# Patient Record
Sex: Male | Born: 1945
Health system: Southern US, Community
[De-identification: ages and names within clinical notes are randomized; demographics above are authoritative.]

## PROBLEM LIST (undated history)

## (undated) DIAGNOSIS — Z86010 Personal history of colonic polyps: Secondary | ICD-10-CM

## (undated) DIAGNOSIS — K279 Peptic ulcer, site unspecified, unspecified as acute or chronic, without hemorrhage or perforation: Secondary | ICD-10-CM

## (undated) DIAGNOSIS — Z8679 Personal history of other diseases of the circulatory system: Secondary | ICD-10-CM

## (undated) DIAGNOSIS — Z8669 Personal history of other diseases of the nervous system and sense organs: Secondary | ICD-10-CM

## (undated) DIAGNOSIS — L0390 Cellulitis, unspecified: Secondary | ICD-10-CM

## (undated) DIAGNOSIS — C801 Malignant (primary) neoplasm, unspecified: Secondary | ICD-10-CM

## (undated) DIAGNOSIS — M1990 Unspecified osteoarthritis, unspecified site: Secondary | ICD-10-CM

## (undated) DIAGNOSIS — I10 Essential (primary) hypertension: Secondary | ICD-10-CM

## (undated) HISTORY — PX: APPENDECTOMY: SHX54

## (undated) HISTORY — DX: Personal history of other diseases of the circulatory system: Z86.79

## (undated) HISTORY — DX: Cellulitis, unspecified: L03.90

## (undated) HISTORY — PX: MANDIBLE FRACTURE SURGERY: SHX706

## (undated) HISTORY — PX: PARTIAL GASTRECTOMY: SHX6003

## (undated) HISTORY — DX: Unspecified osteoarthritis, unspecified site: M19.90

## (undated) HISTORY — DX: Personal history of colonic polyps: Z86.010

## (undated) HISTORY — DX: Personal history of other diseases of the nervous system and sense organs: Z86.69

## (undated) HISTORY — DX: Peptic ulcer, site unspecified, unspecified as acute or chronic, without hemorrhage or perforation: K27.9

---

## 1988-02-08 DIAGNOSIS — K279 Peptic ulcer, site unspecified, unspecified as acute or chronic, without hemorrhage or perforation: Secondary | ICD-10-CM | POA: Insufficient documentation

## 2005-11-23 DIAGNOSIS — E7800 Pure hypercholesterolemia, unspecified: Secondary | ICD-10-CM | POA: Insufficient documentation

## 2005-11-25 DIAGNOSIS — E785 Hyperlipidemia, unspecified: Secondary | ICD-10-CM | POA: Insufficient documentation

## 2007-03-26 DIAGNOSIS — B009 Herpesviral infection, unspecified: Secondary | ICD-10-CM | POA: Insufficient documentation

## 2008-04-04 ENCOUNTER — Ambulatory Visit: Payer: Self-pay | Admitting: Family Medicine

## 2008-04-04 DIAGNOSIS — Z8701 Personal history of pneumonia (recurrent): Secondary | ICD-10-CM | POA: Insufficient documentation

## 2008-04-04 HISTORY — DX: Personal history of pneumonia (recurrent): Z87.01

## 2008-05-01 ENCOUNTER — Ambulatory Visit: Payer: Self-pay | Admitting: Family Medicine

## 2011-08-10 DIAGNOSIS — L4050 Arthropathic psoriasis, unspecified: Secondary | ICD-10-CM | POA: Insufficient documentation

## 2013-02-10 LAB — HM COLONOSCOPY

## 2013-02-21 DIAGNOSIS — K259 Gastric ulcer, unspecified as acute or chronic, without hemorrhage or perforation: Secondary | ICD-10-CM | POA: Insufficient documentation

## 2014-05-02 LAB — LIPID PANEL
CHOLESTEROL: 132 mg/dL (ref 0–200)
HDL: 35 mg/dL (ref 35–70)
LDL CALC: 78 mg/dL
TRIGLYCERIDES: 97 mg/dL (ref 40–160)

## 2014-05-02 LAB — BASIC METABOLIC PANEL
BUN: 8 mg/dL (ref 4–21)
Creatinine: 1 mg/dL (ref <=1.3)
GLUCOSE: 83 mg/dL
Potassium: 4.5 mmol/L (ref 3.4–5.3)
SODIUM: 142 mmol/L (ref 137–147)

## 2014-05-02 LAB — HEPATIC FUNCTION PANEL
ALT: 18 U/L (ref 10–40)
AST: 18 U/L (ref 14–40)

## 2014-05-08 ENCOUNTER — Ambulatory Visit
Admit: 2014-05-08 | Disposition: A | Discharge status: Home / Self Care | Payer: Self-pay | Attending: Family Medicine | Admitting: Family Medicine

## 2015-03-24 ENCOUNTER — Ambulatory Visit (INDEPENDENT_AMBULATORY_CARE_PROVIDER_SITE_OTHER): Payer: Medicare Other | Admitting: Family Medicine

## 2015-03-24 ENCOUNTER — Encounter: Payer: Self-pay | Admitting: Family Medicine

## 2015-03-24 VITALS — BP 128/68 | HR 81 | Temp 98.2°F | Resp 18

## 2015-03-24 DIAGNOSIS — Z8719 Personal history of other diseases of the digestive system: Secondary | ICD-10-CM | POA: Insufficient documentation

## 2015-03-24 DIAGNOSIS — Z8679 Personal history of other diseases of the circulatory system: Secondary | ICD-10-CM

## 2015-03-24 DIAGNOSIS — M1990 Unspecified osteoarthritis, unspecified site: Secondary | ICD-10-CM | POA: Insufficient documentation

## 2015-03-24 DIAGNOSIS — Z8669 Personal history of other diseases of the nervous system and sense organs: Secondary | ICD-10-CM

## 2015-03-24 DIAGNOSIS — B353 Tinea pedis: Secondary | ICD-10-CM | POA: Insufficient documentation

## 2015-03-24 DIAGNOSIS — Z860101 Personal history of adenomatous and serrated colon polyps: Secondary | ICD-10-CM | POA: Insufficient documentation

## 2015-03-24 DIAGNOSIS — L4050 Arthropathic psoriasis, unspecified: Secondary | ICD-10-CM

## 2015-03-24 DIAGNOSIS — Z7189 Other specified counseling: Secondary | ICD-10-CM

## 2015-03-24 DIAGNOSIS — Z86010 Personal history of colonic polyps: Secondary | ICD-10-CM

## 2015-03-24 DIAGNOSIS — D849 Immunodeficiency, unspecified: Secondary | ICD-10-CM | POA: Insufficient documentation

## 2015-03-24 DIAGNOSIS — G8929 Other chronic pain: Secondary | ICD-10-CM

## 2015-03-24 DIAGNOSIS — D899 Disorder involving the immune mechanism, unspecified: Secondary | ICD-10-CM

## 2015-03-24 DIAGNOSIS — L989 Disorder of the skin and subcutaneous tissue, unspecified: Secondary | ICD-10-CM | POA: Insufficient documentation

## 2015-03-24 HISTORY — DX: Personal history of colonic polyps: Z86.010

## 2015-03-24 HISTORY — DX: Personal history of other diseases of the nervous system and sense organs: Z86.69

## 2015-03-24 HISTORY — DX: Personal history of adenomatous and serrated colon polyps: Z86.0101

## 2015-03-24 HISTORY — DX: Personal history of other diseases of the circulatory system: Z86.79

## 2015-03-24 NOTE — Progress Notes (Signed)
Patient: Edward Mejia Male    DOB: 1946-02-05   70 y.o.   MRN: [id number] Visit Date: 03/24/2015  Today's Provider: Lelon Huh, MD   Chief Complaint  Patient presents with  . Arthritis   Subjective:    HPI  Arthritis: Patient with long history of arthritis followed by Dr. Esperanza Sheets in [place]. Has been on scheduled pain medications for many year, previously on Darvocet until it was discontinued. Was then started on hydrocodone 5/325 TID which was very effective for years, but increase 7.5mg /325 about 2 or three years ago. He has occasional exacerbations which he states respond well to steroids by Dr. Esperanza Sheets. However, he presents letter that Dr. Esperanza Sheets sent him a few months ago stating that Dr. Esperanza Sheets could no longer prescribe hydrocodone due to new regulations, and was advised to go to pain clinic to get his prescriptions. Dr. Esperanza Sheets switched him to tramadol about 6 weeks ago and states pain has been terrible.      Allergies  Allergen Reactions  . Pravachol  [Pravastatin]     Fatigue   Previous Medications   ETANERCEPT (ENBREL) 50 MG/ML INJECTION    Inject into the skin once a week.   FOLIC ACID PO    Take by mouth daily.   METHOTREXATE 2.5 MG TABLET    Take 1 tablet by mouth 3 (three) times a week.   MISOPROSTOL (CYTOTEC) 100 MCG TABLET    Take 1 tablet by mouth daily.   MULTIPLE VITAMIN PO    Take 1 tablet by mouth daily.   OMEGA-3 FATTY ACIDS PO    Take 2 capsules by mouth daily.   PREDNISONE (DELTASONE) 5 MG TABLET    Take 1.5 tablets by mouth daily.   TRAMADOL (ULTRAM) 50 MG TABLET    Take 2 tablets by mouth every 8 (eight) hours as needed.    Review of Systems  Constitutional: Negative for fever, chills and appetite change.  Respiratory: Negative for chest tightness, shortness of breath and wheezing.   Cardiovascular: Negative for chest pain and palpitations.  Gastrointestinal: Negative for nausea, vomiting and abdominal pain.  Musculoskeletal:  Positive for arthralgias. Negative for myalgias and joint swelling.    Social History  Substance Use Topics  . Smoking status: Never Smoker   . Smokeless tobacco: Not on file  . Alcohol Use: No   Objective:   BP 128/68 mmHg  Pulse 81  Temp(Src) 98.2 F (36.8 C) (Oral)  Resp 18  SpO2 96%  Physical Exam  General appearance: alert, well developed, well nourished, cooperative and in no distress Head: Normocephalic, without obvious abnormality, atraumatic Lungs: Respirations even and unlabored  Skin: Skin color, texture, turgor normal. No rashes seen  Psych: Appropriate mood and affect. Neurologic: Mental status: Alert, oriented to person, place, and time, thought content appropriate.     Assessment & Plan:     1. Encounter for chronic pain management Extensively reviewed records of Dr. Esperanza Sheets through care everywhere. No signs of abuse, dependence or diversion and patient has been stable on same dose of hydrocodone APAP for years. He gets occasional joint injections from his rheumatologist. Will check UDS, if results are as expected we can resume prescribing prior dose of hydrocodone/apap.  - Pain Management Screening Profile (10S)  2. Psoriatic arthritis ([place])   Over half of this 30 minute visit were spent in counseling and coordination of care.       Lelon Huh, MD  [facility]

## 2015-03-26 ENCOUNTER — Other Ambulatory Visit: Payer: Self-pay | Admitting: Family Medicine

## 2015-03-26 DIAGNOSIS — L4050 Arthropathic psoriasis, unspecified: Secondary | ICD-10-CM | POA: Diagnosis not present

## 2015-03-26 DIAGNOSIS — Z79899 Other long term (current) drug therapy: Secondary | ICD-10-CM | POA: Diagnosis not present

## 2015-03-26 DIAGNOSIS — M67312 Transient synovitis, left shoulder: Secondary | ICD-10-CM | POA: Diagnosis not present

## 2015-03-26 LAB — PMP SCREEN PROFILE (10S), URINE
Amphetamine Screen, Ur: NEGATIVE ng/mL
Barbiturate Screen, Ur: NEGATIVE ng/mL
Benzodiazepine Screen, Urine: NEGATIVE ng/mL
Cannabinoids Ur Ql Scn: NEGATIVE ng/mL
Cocaine(Metab.)Screen, Urine: NEGATIVE ng/mL
Creatinine(Crt), U: 58 mg/dL (ref 20.0–300.0)
Methadone Scn, Ur: NEGATIVE ng/mL
Opiate Scrn, Ur: NEGATIVE ng/mL
Oxycodone+Oxymorphone Ur Ql Scn: NEGATIVE ng/mL
PCP Scrn, Ur: NEGATIVE ng/mL
Ph of Urine: 5.2 (ref 4.5–8.9)
Propoxyphene, Screen: NEGATIVE ng/mL

## 2015-03-26 MED ORDER — HYDROCODONE-ACETAMINOPHEN 7.5-325 MG PO TABS: 1.0000 | ORAL_TABLET | Freq: Three times a day (TID) | ORAL | Status: DC | PRN

## 2015-03-27 ENCOUNTER — Ambulatory Visit
Admission: RE | Admit: 2015-03-27 | Discharge: 2015-03-27 | Disposition: A | Discharge status: Home / Self Care | Payer: Medicare Other | Source: Ambulatory Visit | Attending: Family Medicine | Admitting: Family Medicine

## 2015-03-27 ENCOUNTER — Ambulatory Visit (INDEPENDENT_AMBULATORY_CARE_PROVIDER_SITE_OTHER): Payer: Medicare Other | Admitting: Family Medicine

## 2015-03-27 ENCOUNTER — Encounter: Payer: Self-pay | Admitting: Family Medicine

## 2015-03-27 VITALS — BP 130/60 | HR 68 | Temp 97.5°F | Resp 16 | Wt 211.0 lb

## 2015-03-27 DIAGNOSIS — R61 Generalized hyperhidrosis: Secondary | ICD-10-CM

## 2015-03-27 DIAGNOSIS — Z8711 Personal history of peptic ulcer disease: Secondary | ICD-10-CM | POA: Insufficient documentation

## 2015-03-27 DIAGNOSIS — R079 Chest pain, unspecified: Secondary | ICD-10-CM | POA: Diagnosis not present

## 2015-03-27 DIAGNOSIS — R0609 Other forms of dyspnea: Secondary | ICD-10-CM

## 2015-03-27 DIAGNOSIS — Z87891 Personal history of nicotine dependence: Secondary | ICD-10-CM | POA: Diagnosis not present

## 2015-03-27 DIAGNOSIS — R0600 Dyspnea, unspecified: Secondary | ICD-10-CM

## 2015-03-27 NOTE — Progress Notes (Signed)
Patient: Edward Mejia Male    DOB: 01-May-1945   70 y.o.   MRN: [id number] Visit Date: 03/27/2015  Today's Provider: Lelon Huh, MD   Chief Complaint  Patient presents with  . Chest Pain   Subjective:    Chest Pain  This is a new problem. Episode onset: 4-6 weeks ago. The problem occurs intermittently. Pain location: in center of chest. The pain is moderate. The quality of the pain is described as dull (ache). Associated symptoms include a cough, diaphoresis, dizziness, malaise/fatigue, nausea, shortness of breath and sputum production. Pertinent negatives include no abdominal pain, fever, headaches, irregular heartbeat, leg pain, lower extremity edema, near-syncope, numbness, palpitations, PND, syncope, vomiting or weakness.  Patient states usually he has back pain first that then brings on the chest pain. Initially pain was behind his shoulder blades, but that pain improved when Dr. Esperanza Sheets put him on prednisone. He continue to episodes of pain in mid chest often occuring during exertion. Resolves after resting for a few minutes.  He has also been getting short of breath whenever he exerts himself which quickly resolved when he stops to rest.   Lab Results  Component Value Date   CHOL 132 05/02/2014   HDL 35 05/02/2014   LDLCALC 78 05/02/2014   TRIG 97 05/02/2014        Past Medical History  Diagnosis Date  . Peptic ulcer disease   . History of brain disorder: history of amaurosis fugax 03/24/2015  . H/O adenomatous polyp of colon 03/24/2015  . History of rheumatic fever 03/24/2015    DID Have Rheumatic Fever.       Patient Active Problem List   Diagnosis Date Noted  . Pain in the chest 03/27/2015  . Arthritis 03/24/2015  . History of rheumatic fever 03/24/2015  . History of GI bleed 03/24/2015  . History of brain disorder: history of amaurosis fugax 03/24/2015  . H/O adenomatous polyp of colon 03/24/2015  . Immunosuppressed status ([place]) 03/24/2015  . Skin  lesion 03/24/2015  . Athlete's foot 03/24/2015  . Psoriatic arthritis ([place]) 08/10/2011  . Dyslipidemia 11/25/2005  . Hypercholesterolemia without hypertriglyceridemia 11/23/2005     Allergies  Allergen Reactions  . Pravachol  [Pravastatin]     Fatigue   Previous Medications   ETANERCEPT (ENBREL) 50 MG/ML INJECTION    Inject into the skin once a week.   FOLIC ACID PO    Take by mouth daily.   HYDROCODONE-ACETAMINOPHEN (NORCO) 7.5-325 MG TABLET    Take 1 tablet by mouth 3 (three) times daily as needed for moderate pain.   METHOTREXATE 2.5 MG TABLET    Take 1 tablet by mouth 3 (three) times a week.   MISOPROSTOL (CYTOTEC) 100 MCG TABLET    Take 1 tablet by mouth daily.   MULTIPLE VITAMIN PO    Take 1 tablet by mouth daily.   OMEGA-3 FATTY ACIDS PO    Take 2 capsules by mouth daily.   PREDNISONE (DELTASONE) 5 MG TABLET    Take 1.5 tablets by mouth daily.    Review of Systems  Constitutional: Positive for malaise/fatigue and diaphoresis. Negative for fever, chills and appetite change.  Respiratory: Positive for cough, sputum production and shortness of breath. Negative for chest tightness and wheezing.   Cardiovascular: Positive for chest pain. Negative for palpitations, syncope, PND and near-syncope.  Gastrointestinal: Positive for nausea. Negative for vomiting and abdominal pain.  Neurological: Positive for dizziness. Negative for weakness, numbness and headaches.  Social History  Substance Use Topics  . Smoking status: Never Smoker   . Smokeless tobacco: Not on file  . Alcohol Use: No   Objective:   BP 130/60 mmHg  Pulse 68  Temp(Src) 97.5 F (36.4 C) (Oral)  Resp 16  Wt 211 lb (95.709 kg)  SpO2 97%  Physical Exam   General Appearance:    Alert, cooperative, no distress  Eyes:    PERRL, conjunctiva/corneas clear, EOM's intact       Lungs:     Clear to auscultation bilaterally, respirations unlabored  Heart:    Regular rate and rhythm. I/VI systolic murmur.     Neurologic:   Awake, alert, oriented x 3. No apparent focal neurological           defect.      EKG Non-specific T abnormality      Assessment & Plan:     1. Chest pain, unspecified chest pain type Some features suggestive of cardiac pain. Check labs, start 81mg  ASA. Will likely need cardiology referral. Consider chest CT.  - EKG 12-Lead - Troponin I - D-Dimer, Quantitative - DG Chest 2 View; Future  2. Diaphoresis  - Troponin I - D-Dimer, Quantitative - DG Chest 2 View; Future  3. Dyspnea on exertion - Brain natriuretic peptide - Troponin I - D-Dimer, Quantitative - DG Chest 2 View; Future        Lelon Huh, MD  [facility]

## 2015-03-27 NOTE — Patient Instructions (Signed)
Recommend taking 81mg  enteric coated aspirin to reduce risk of vascular events such as heart attacks and strokes.   Go to the [facility] on [facility] for Chest  Xray

## 2015-03-28 LAB — D-DIMER, QUANTITATIVE: D-DIMER: 0.43 mg/L FEU (ref 0.00–0.49)

## 2015-03-28 LAB — BRAIN NATRIURETIC PEPTIDE: BNP: 47 pg/mL (ref 0.0–100.0)

## 2015-03-28 LAB — TROPONIN I: Troponin I: <0.01 ng/mL (ref 0.00–0.04)

## 2015-03-30 ENCOUNTER — Encounter: Payer: Self-pay | Admitting: Family Medicine

## 2015-03-30 ENCOUNTER — Other Ambulatory Visit: Payer: Self-pay | Admitting: Family Medicine

## 2015-03-30 DIAGNOSIS — R079 Chest pain, unspecified: Secondary | ICD-10-CM

## 2015-03-30 NOTE — Progress Notes (Unsigned)
Please refer cardiology for chest pain

## 2015-03-31 DIAGNOSIS — R0789 Other chest pain: Secondary | ICD-10-CM | POA: Diagnosis not present

## 2015-03-31 DIAGNOSIS — I10 Essential (primary) hypertension: Secondary | ICD-10-CM | POA: Diagnosis not present

## 2015-04-06 DIAGNOSIS — R0789 Other chest pain: Secondary | ICD-10-CM | POA: Diagnosis not present

## 2015-04-09 ENCOUNTER — Telehealth: Payer: Self-pay | Admitting: Family Medicine

## 2015-04-09 NOTE — Telephone Encounter (Signed)
Pt wife called to see if we had the results to pt's stress test that was done with Dr. Ubaldo Glassing. She stated that Dr. Caryn Section ordered the test. Thanks TNP

## 2015-04-09 NOTE — Telephone Encounter (Signed)
Please advise 

## 2015-04-10 DIAGNOSIS — Z79899 Other long term (current) drug therapy: Secondary | ICD-10-CM | POA: Diagnosis not present

## 2015-04-10 NOTE — Telephone Encounter (Signed)
Wife advised, she states Dr. Ubaldo Glassing office told them they would send it to [place] and we would call them, she will call Dr .Ubaldo Glassing office again and see what they say, i told her to call us back if we need to look in to it further-aa

## 2015-04-10 NOTE — Telephone Encounter (Signed)
Edward Mejia was notified. Expressed understanding.

## 2015-04-10 NOTE — Telephone Encounter (Signed)
We referred the patient to Dr. Ubaldo Glassing. Dr Ubaldo Glassing ordered the test so we do not have the results. They will need to contact his office.

## 2015-04-10 NOTE — Telephone Encounter (Signed)
The stress test was normal, no sign of any heart problems.

## 2015-04-20 DIAGNOSIS — M461 Sacroiliitis, not elsewhere classified: Secondary | ICD-10-CM | POA: Diagnosis not present

## 2015-04-20 DIAGNOSIS — Z79899 Other long term (current) drug therapy: Secondary | ICD-10-CM | POA: Diagnosis not present

## 2015-04-20 DIAGNOSIS — L4050 Arthropathic psoriasis, unspecified: Secondary | ICD-10-CM | POA: Diagnosis not present

## 2015-04-27 ENCOUNTER — Other Ambulatory Visit: Payer: Self-pay | Admitting: Family Medicine

## 2015-04-27 MED ORDER — HYDROCODONE-ACETAMINOPHEN 7.5-325 MG PO TABS: 1.0000 | ORAL_TABLET | Freq: Three times a day (TID) | ORAL | Status: DC | PRN

## 2015-04-27 NOTE — Telephone Encounter (Signed)
Patient needs refill on HYDROcodone-acetaminophen (NORCO) 7.5-325 MG tablet   Cb# [phone]

## 2015-05-28 ENCOUNTER — Other Ambulatory Visit: Payer: Self-pay | Admitting: Family Medicine

## 2015-05-28 NOTE — Telephone Encounter (Signed)
Patient's wife [name]) is calling requesting a refill on his HYDROcodone-acetaminophen ([place]) 7.5-325 MG tablet Please call her when RX is ready for pick up. Patient is giving permission for his daughter Ciro Backer to be able to pick up his RX. Call if you have any questions [phone]

## 2015-05-29 MED ORDER — HYDROCODONE-ACETAMINOPHEN 7.5-325 MG PO TABS: 1.0000 | ORAL_TABLET | Freq: Three times a day (TID) | ORAL | Status: DC | PRN

## 2015-07-07 ENCOUNTER — Other Ambulatory Visit: Payer: Self-pay | Admitting: Family Medicine

## 2015-07-07 DIAGNOSIS — L4050 Arthropathic psoriasis, unspecified: Secondary | ICD-10-CM | POA: Diagnosis not present

## 2015-07-07 DIAGNOSIS — Z79899 Other long term (current) drug therapy: Secondary | ICD-10-CM | POA: Diagnosis not present

## 2015-07-07 MED ORDER — HYDROCODONE-ACETAMINOPHEN 7.5-325 MG PO TABS: 1.0000 | ORAL_TABLET | Freq: Three times a day (TID) | ORAL | Status: DC | PRN

## 2015-07-07 NOTE — Telephone Encounter (Signed)
Pt contacted office for refill request on the following medications:  HYDROcodone-acetaminophen (NORCO) 7.5-325 MG tablet.  CB#[phone]/MW °

## 2015-07-13 DIAGNOSIS — Z79899 Other long term (current) drug therapy: Secondary | ICD-10-CM | POA: Diagnosis not present

## 2015-07-29 ENCOUNTER — Other Ambulatory Visit: Payer: Self-pay | Admitting: Family Medicine

## 2015-07-29 DIAGNOSIS — Z79899 Other long term (current) drug therapy: Secondary | ICD-10-CM | POA: Diagnosis not present

## 2015-07-29 DIAGNOSIS — M67312 Transient synovitis, left shoulder: Secondary | ICD-10-CM | POA: Diagnosis not present

## 2015-07-29 DIAGNOSIS — L4050 Arthropathic psoriasis, unspecified: Secondary | ICD-10-CM | POA: Diagnosis not present

## 2015-07-29 MED ORDER — HYDROCODONE-ACETAMINOPHEN 7.5-325 MG PO TABS: 1.0000 | ORAL_TABLET | Freq: Three times a day (TID) | ORAL | Status: DC | PRN

## 2015-07-30 ENCOUNTER — Other Ambulatory Visit: Payer: Self-pay | Admitting: Family Medicine

## 2015-07-30 MED ORDER — HYDROCODONE-ACETAMINOPHEN 7.5-325 MG PO TABS: 1.0000 | ORAL_TABLET | Freq: Three times a day (TID) | ORAL | Status: DC | PRN

## 2015-08-03 DIAGNOSIS — D0471 Carcinoma in situ of skin of right lower limb, including hip: Secondary | ICD-10-CM | POA: Diagnosis not present

## 2015-08-03 DIAGNOSIS — L570 Actinic keratosis: Secondary | ICD-10-CM | POA: Diagnosis not present

## 2015-08-03 DIAGNOSIS — D225 Melanocytic nevi of trunk: Secondary | ICD-10-CM | POA: Diagnosis not present

## 2015-08-03 DIAGNOSIS — Z85828 Personal history of other malignant neoplasm of skin: Secondary | ICD-10-CM | POA: Diagnosis not present

## 2015-08-03 DIAGNOSIS — L821 Other seborrheic keratosis: Secondary | ICD-10-CM | POA: Diagnosis not present

## 2015-08-07 ENCOUNTER — Telehealth: Payer: Self-pay | Admitting: Family Medicine

## 2015-08-07 NOTE — Telephone Encounter (Signed)
Pt contacted office for refill request on the following medications:  HYDROcodone-acetaminophen (NORCO) 7.5-325 MG tablet.  CB#[phone]/MW °

## 2015-08-07 NOTE — Telephone Encounter (Signed)
Pt advised-aa 

## 2015-08-25 ENCOUNTER — Encounter: Payer: Self-pay | Admitting: Family Medicine

## 2015-08-25 ENCOUNTER — Ambulatory Visit (INDEPENDENT_AMBULATORY_CARE_PROVIDER_SITE_OTHER): Payer: Medicare Other | Admitting: Family Medicine

## 2015-08-25 VITALS — BP 138/78 | HR 68 | Temp 98.0°F | Resp 16 | Ht 73.0 in | Wt 219.0 lb

## 2015-08-25 DIAGNOSIS — E7800 Pure hypercholesterolemia, unspecified: Secondary | ICD-10-CM

## 2015-08-25 DIAGNOSIS — Z0000 Encounter for general adult medical examination without abnormal findings: Secondary | ICD-10-CM

## 2015-08-25 DIAGNOSIS — Z125 Encounter for screening for malignant neoplasm of prostate: Secondary | ICD-10-CM | POA: Diagnosis not present

## 2015-08-25 DIAGNOSIS — L4050 Arthropathic psoriasis, unspecified: Secondary | ICD-10-CM

## 2015-08-25 DIAGNOSIS — R5383 Other fatigue: Secondary | ICD-10-CM | POA: Diagnosis not present

## 2015-08-25 DIAGNOSIS — Z1159 Encounter for screening for other viral diseases: Secondary | ICD-10-CM

## 2015-08-25 MED ORDER — HYDROCODONE-ACETAMINOPHEN 7.5-325 MG PO TABS: 1.0000 | ORAL_TABLET | Freq: Three times a day (TID) | ORAL | Status: DC | PRN

## 2015-08-25 NOTE — Progress Notes (Signed)
Patient: Edward Mejia, Male    DOB: [birth year], 70 y.o.   MRN: [id number] Visit Date: 08/25/2015  Today's Provider: Lelon Huh, MD   Chief Complaint  Patient presents with  . Annual Exam  . Arthritis    follow up   Subjective:    Annual physical  Edward Mejia is a 70 y.o. male. He feels fairly well. He reports never exercising. He reports he is sleeping fairly well.  ----------------------------------------------------------- Follow up Psoriatic Arthritis: Patient was last seen 5 months ago. Urine drug screen was obtained during that visit and patient was started back on Hydrocodone/ APAP. Patient reports good compliance with treatment, good tolerance and fair symptom control.   Complains of worsening fatigue for the last couple of months. Sleeping well at night. labs 07/13/2015 from rheumatology show normal CBC, liver function  Review of Systems  Constitutional: Positive for fatigue. Negative for fever, chills and appetite change.  HENT: Negative for congestion, ear pain, hearing loss, nosebleeds and trouble swallowing.   Eyes: Negative for pain and visual disturbance.  Respiratory: Negative for cough, chest tightness and shortness of breath.   Cardiovascular: Negative for chest pain, palpitations and leg swelling.  Gastrointestinal: Negative for nausea, vomiting, abdominal pain, diarrhea, constipation and blood in stool.  Endocrine: Negative for polydipsia, polyphagia and polyuria.  Genitourinary: Negative for dysuria and flank pain.  Musculoskeletal: Positive for back pain and arthralgias. Negative for myalgias, joint swelling and neck stiffness.  Skin: Negative for color change, rash and wound.  Neurological: Positive for weakness and light-headedness. Negative for dizziness, tremors, seizures, speech difficulty and headaches.  Psychiatric/Behavioral: Negative for behavioral problems, confusion, sleep disturbance, dysphoric mood and decreased concentration.  The patient is not nervous/anxious.   All other systems reviewed and are negative.   Social History   Social History  . Marital Status: Married    Spouse Name: N/A  . Number of Children: N/A  . Years of Education: N/A   Occupational History  . Not on file.   Social History Main Topics  . Smoking status: Never Smoker   . Smokeless tobacco: Not on file  . Alcohol Use: No  . Drug Use: No  . Sexual Activity: Not on file   Other Topics Concern  . Not on file   Social History Narrative    Past Medical History  Diagnosis Date  . Peptic ulcer disease   . History of brain disorder: history of amaurosis fugax 03/24/2015  . H/O adenomatous polyp of colon 03/24/2015  . History of rheumatic fever 03/24/2015    DID Have Rheumatic Fever.       Patient Active Problem List   Diagnosis Date Noted  . Pain in the chest 03/27/2015  . Arthritis 03/24/2015  . History of GI bleed 03/24/2015  . History of brain disorder: history of amaurosis fugax 03/24/2015  . H/O adenomatous polyp of colon 03/24/2015  . Immunosuppressed status ([place]) 03/24/2015  . Psoriatic arthritis ([place]) 08/10/2011  . History of pneumonia 04/04/2008  . Herpes 03/26/2007  . Dyslipidemia 11/25/2005  . Hypercholesteremia 11/23/2005    Past Surgical History  Procedure Laterality Date  . Appendectomy    . Partial gastrectomy      peptic ulcer disease  . Mandible fracture surgery      fractured jaw    His family history includes Cancer in his brother, brother, and mother; Heart disease (age of onset: 62) in his father.    Current Meds  Medication Sig  .  etanercept (ENBREL) 50 MG/ML injection Inject into the skin once a week.  Marland Kitchen FOLIC ACID PO Take by mouth daily.  Marland Kitchen HYDROcodone-acetaminophen (NORCO) 7.5-325 MG tablet Take 1 tablet by mouth 3 (three) times daily as needed for moderate pain.  . methotrexate 2.5 MG tablet Take 1 tablet by mouth 3 (three) times a week.  . misoprostol (CYTOTEC) 100 MCG tablet Take 1  tablet by mouth daily.  . MULTIPLE VITAMIN PO Take 1 tablet by mouth daily.  . OMEGA-3 FATTY ACIDS PO Take 2 capsules by mouth daily.  . predniSONE (DELTASONE) 5 MG tablet Take 1.5 tablets by mouth daily.    Patient Care Team: Birdie Sons, MD as PCP - General (Family Medicine) Rosaria Ferries, MD as Referring Physician (Rheumatology)    Objective:   Vitals: BP 152/70 mmHg  Pulse 68  Temp(Src) 98 F (36.7 C) (Oral)  Resp 16  Ht 6\' 1"  (1.854 m)  Wt 219 lb (99.338 kg)  BMI 28.90 kg/m2  Physical Exam   General Appearance:    Alert, cooperative, no distress, appears stated age  Head:    Normocephalic, without obvious abnormality, atraumatic  Eyes:    PERRL, conjunctiva/corneas clear, EOM's intact, fundi    benign, both eyes       Ears:    Normal TM's and external ear canals, both ears  Nose:   Nares normal, septum midline, mucosa normal, no drainage   or sinus tenderness  Throat:   Lips, mucosa, and tongue normal; teeth and gums normal  Neck:   Supple, symmetrical, trachea midline, no adenopathy;       thyroid:  No enlargement/tenderness/nodules; no carotid   bruit or JVD  Back:     Symmetric, no curvature, ROM normal, no CVA tenderness  Lungs:     Clear to auscultation bilaterally, respirations unlabored  Chest wall:    No tenderness or deformity  Heart:    Regular rate and rhythm, S1 and S2 normal, no murmur, rub   or gallop  Abdomen:     Soft, non-tender, bowel sounds active all four quadrants,    no masses, no organomegaly  Genitalia:    deferred  Rectal:    deferred  Extremities:   Extremities normal, atraumatic, no cyanosis or edema  Pulses:   2+ and symmetric all extremities  Skin:   Skin color, texture, turgor normal, no rashes or lesions  Lymph nodes:   Cervical, supraclavicular, and axillary nodes normal  Neurologic:   CNII-XII intact. Normal strength, sensation and reflexes      throughout    Activities of Daily Living In your present state of  health, do you have any difficulty performing the following activities: 08/25/2015  Hearing? Y  Vision? N  Difficulty concentrating or making decisions? N  Walking or climbing stairs? Y  Dressing or bathing? N  Doing errands, shopping? Y    Fall Risk Assessment Fall Risk  08/25/2015  Falls in the past year? No     Depression Screen PHQ 2/9 Scores 08/25/2015  PHQ - 2 Score 0  PHQ- 9 Score 3    Cognitive Testing - 6-CIT  Correct? Score   What year is it? yes 0 0 or 4  What month is it? yes 0 0 or 3  Memorize:    Pia Mau,  41,  [place],      What time is it? (within 1 hour) yes 0 0 or 3  Count backwards from 20 yes  0 0, 2, or 4  Name the months of the year yes 0 0, 2, or 4  Repeat name & address above no 6 0, 2, 4, 6, 8, or 10       TOTAL SCORE  6/28   Interpretation:  Normal  Normal (0-7) Abnormal (8-28)    Audit-C Alcohol Use Screening  Question Answer Points  How often do you have alcoholic drink? never 0  On days you do drink alcohol, how many drinks do you typically consume? n/a 0  How oftey will you drink 6 or more in a total? never 0  Total Score:  0   A score of 3 or more in women, and 4 or more in men indicates increased risk for alcohol abuse, EXCEPT if all of the points are from question 1.        Assessment & Plan:     Annual Physical  Reviewed patient's Family Medical History Reviewed and updated list of patient's medical providers Assessment of cognitive impairment was done Assessed patient's functional ability Established a written schedule for health screening [place] Completed and Reviewed  Exercise Activities and Dietary recommendations Goals    None      Immunization History  Administered Date(s) Administered  . Influenza-Unspecified 11/15/2012  . Pneumococcal Polysaccharide-23 01/16/2002, 10/31/2011    Health Maintenance  Topic Date Due  . Hepatitis C Screening  1945/07/04  . TETANUS/TDAP   01/20/1964  . ZOSTAVAX  01/19/2005  . PNA vac Low Risk Adult (2 of 2 - PCV13) 10/30/2012  . INFLUENZA VACCINE  09/08/2015  . COLONOSCOPY  02/11/2016      Discussed health benefits of physical activity, and encouraged him to engage in regular exercise appropriate for his age and condition.    ------------------------------------------------------------------------------------------------------------ 1. Annual physical exam   2. Psoriatic arthritis ([place]) Fairly well controlled. Followed by Jefm Vonbehren. Due for RF hydrocodone/apap - HYDROcodone-acetaminophen (NORCO) 7.5-325 MG tablet; Take 1 tablet by mouth 3 (three) times daily as needed for moderate pain.  Dispense: 90 tablet; Refill: 0  3. Other fatigue  - Comprehensive metabolic panel - TSH  4. Hypercholesteremia  - Lipid panel  5. Prostate cancer screening  - PSA  6. Need for hepatitis C screening test  - Hepatitis C antibody    Lelon Huh, MD  [facility]

## 2015-08-27 DIAGNOSIS — R5383 Other fatigue: Secondary | ICD-10-CM | POA: Diagnosis not present

## 2015-08-27 DIAGNOSIS — Z125 Encounter for screening for malignant neoplasm of prostate: Secondary | ICD-10-CM | POA: Diagnosis not present

## 2015-08-27 DIAGNOSIS — E7800 Pure hypercholesterolemia, unspecified: Secondary | ICD-10-CM | POA: Diagnosis not present

## 2015-08-27 DIAGNOSIS — Z1159 Encounter for screening for other viral diseases: Secondary | ICD-10-CM | POA: Diagnosis not present

## 2015-08-28 LAB — COMPREHENSIVE METABOLIC PANEL
A/G RATIO: 2.1 (ref 1.2–2.2)
ALT: 20 IU/L (ref 0–44)
AST: 17 IU/L (ref 0–40)
Albumin: 4.1 g/dL (ref 3.5–4.8)
Alkaline Phosphatase: 43 IU/L (ref 39–117)
BILIRUBIN TOTAL: 1 mg/dL (ref 0.0–1.2)
BUN/Creatinine Ratio: 10 (ref 10–24)
BUN: 10 mg/dL (ref 8–27)
CALCIUM: 9.3 mg/dL (ref 8.6–10.2)
CO2: 27 mmol/L (ref 18–29)
CREATININE: 1.02 mg/dL (ref 0.76–1.27)
Chloride: 102 mmol/L (ref 96–106)
GFR, EST AFRICAN AMERICAN: 86 mL/min/{1.73_m2} (ref >59)
GFR, EST NON AFRICAN AMERICAN: 74 mL/min/{1.73_m2} (ref >59)
Globulin, Total: 2 g/dL (ref 1.5–4.5)
Glucose: 94 mg/dL (ref 65–99)
POTASSIUM: 4.6 mmol/L (ref 3.5–5.2)
Sodium: 143 mmol/L (ref 134–144)
Total Protein: 6.1 g/dL (ref 6.0–8.5)

## 2015-08-28 LAB — LIPID PANEL
CHOL/HDL RATIO: 4 ratio (ref 0.0–5.0)
Cholesterol, Total: 115 mg/dL (ref 100–199)
HDL: 29 mg/dL — AB (ref >39)
LDL CALC: 61 mg/dL (ref 0–99)
TRIGLYCERIDES: 123 mg/dL (ref 0–149)
VLDL CHOLESTEROL CAL: 25 mg/dL (ref 5–40)

## 2015-08-28 LAB — PSA: Prostate Specific Ag, Serum: 0.6 ng/mL (ref 0.0–4.0)

## 2015-08-28 LAB — TSH: TSH: 4.72 u[IU]/mL — AB (ref 0.450–4.500)

## 2015-08-28 LAB — HEPATITIS C ANTIBODY: Hep C Virus Ab: <0.1 s/co ratio (ref 0.0–0.9)

## 2015-09-09 DIAGNOSIS — M461 Sacroiliitis, not elsewhere classified: Secondary | ICD-10-CM | POA: Diagnosis not present

## 2015-09-09 DIAGNOSIS — Z79899 Other long term (current) drug therapy: Secondary | ICD-10-CM | POA: Diagnosis not present

## 2015-09-09 DIAGNOSIS — L4050 Arthropathic psoriasis, unspecified: Secondary | ICD-10-CM | POA: Diagnosis not present

## 2015-09-15 ENCOUNTER — Telehealth: Payer: Self-pay | Admitting: Family Medicine

## 2015-09-15 DIAGNOSIS — M25559 Pain in unspecified hip: Secondary | ICD-10-CM

## 2015-09-15 NOTE — Telephone Encounter (Signed)
Please refer orthopedics.

## 2015-09-15 NOTE — Telephone Encounter (Signed)
Is it okay to refer?  Thanks,   -Mickel Baas

## 2015-09-15 NOTE — Telephone Encounter (Signed)
Pt's wife [name] stated that pt went to see his rheumatologist and they think pt should be referred to an orthopedic for his hip pain and limited mobility. Pt was in the office for his CPE on 08/25/15. Please advise. Thanks TNP

## 2015-09-28 DIAGNOSIS — M545 Low back pain: Secondary | ICD-10-CM | POA: Diagnosis not present

## 2015-10-03 DIAGNOSIS — M545 Low back pain: Secondary | ICD-10-CM | POA: Diagnosis not present

## 2015-10-06 ENCOUNTER — Other Ambulatory Visit: Payer: Self-pay | Admitting: Family Medicine

## 2015-10-06 DIAGNOSIS — L4050 Arthropathic psoriasis, unspecified: Secondary | ICD-10-CM

## 2015-10-06 MED ORDER — HYDROCODONE-ACETAMINOPHEN 7.5-325 MG PO TABS: 1.0000 | ORAL_TABLET | Freq: Three times a day (TID) | ORAL | 0 refills | Status: DC | PRN

## 2015-10-06 NOTE — Telephone Encounter (Signed)
Pt's wife [name] contacted office for refill request on the following medications:  HYDROcodone-acetaminophen ([place]) 7.5-325 MG tablet  Last written: 08/25/15 Last OV: 08/25/15 Please advise. Thanks TNP

## 2015-10-08 DIAGNOSIS — M4726 Other spondylosis with radiculopathy, lumbar region: Secondary | ICD-10-CM | POA: Diagnosis not present

## 2015-10-09 DIAGNOSIS — Z79899 Other long term (current) drug therapy: Secondary | ICD-10-CM | POA: Diagnosis not present

## 2015-10-19 DIAGNOSIS — M5416 Radiculopathy, lumbar region: Secondary | ICD-10-CM | POA: Diagnosis not present

## 2015-10-28 ENCOUNTER — Other Ambulatory Visit: Payer: Self-pay | Admitting: Family Medicine

## 2015-10-28 DIAGNOSIS — L4050 Arthropathic psoriasis, unspecified: Secondary | ICD-10-CM

## 2015-10-28 MED ORDER — HYDROCODONE-ACETAMINOPHEN 7.5-325 MG PO TABS: 1.0000 | ORAL_TABLET | Freq: Three times a day (TID) | ORAL | 0 refills | Status: DC | PRN

## 2015-11-02 DIAGNOSIS — D225 Melanocytic nevi of trunk: Secondary | ICD-10-CM | POA: Diagnosis not present

## 2015-11-02 DIAGNOSIS — D045 Carcinoma in situ of skin of trunk: Secondary | ICD-10-CM | POA: Diagnosis not present

## 2015-11-02 DIAGNOSIS — L820 Inflamed seborrheic keratosis: Secondary | ICD-10-CM | POA: Diagnosis not present

## 2015-11-02 DIAGNOSIS — Z85828 Personal history of other malignant neoplasm of skin: Secondary | ICD-10-CM | POA: Diagnosis not present

## 2015-11-02 DIAGNOSIS — L821 Other seborrheic keratosis: Secondary | ICD-10-CM | POA: Diagnosis not present

## 2015-11-02 DIAGNOSIS — L570 Actinic keratosis: Secondary | ICD-10-CM | POA: Diagnosis not present

## 2015-11-04 ENCOUNTER — Telehealth: Payer: Self-pay | Admitting: Family Medicine

## 2015-11-04 NOTE — Telephone Encounter (Signed)
error 

## 2015-11-09 DIAGNOSIS — Z79899 Other long term (current) drug therapy: Secondary | ICD-10-CM | POA: Diagnosis not present

## 2015-11-09 DIAGNOSIS — M67311 Transient synovitis, right shoulder: Secondary | ICD-10-CM | POA: Diagnosis not present

## 2015-11-09 DIAGNOSIS — M67312 Transient synovitis, left shoulder: Secondary | ICD-10-CM | POA: Diagnosis not present

## 2015-11-09 DIAGNOSIS — L4050 Arthropathic psoriasis, unspecified: Secondary | ICD-10-CM | POA: Diagnosis not present

## 2015-11-09 DIAGNOSIS — M67362 Transient synovitis, left knee: Secondary | ICD-10-CM | POA: Diagnosis not present

## 2015-11-16 ENCOUNTER — Ambulatory Visit (INDEPENDENT_AMBULATORY_CARE_PROVIDER_SITE_OTHER): Payer: Medicare Other | Admitting: Family Medicine

## 2015-11-16 ENCOUNTER — Encounter: Payer: Self-pay | Admitting: Family Medicine

## 2015-11-16 VITALS — BP 138/74 | HR 84 | Temp 99.0°F | Resp 20 | Wt 221.0 lb

## 2015-11-16 DIAGNOSIS — R5383 Other fatigue: Secondary | ICD-10-CM

## 2015-11-16 DIAGNOSIS — R0609 Other forms of dyspnea: Secondary | ICD-10-CM | POA: Diagnosis not present

## 2015-11-16 DIAGNOSIS — L4050 Arthropathic psoriasis, unspecified: Secondary | ICD-10-CM

## 2015-11-16 DIAGNOSIS — R0600 Dyspnea, unspecified: Secondary | ICD-10-CM

## 2015-11-16 DIAGNOSIS — M791 Myalgia, unspecified site: Secondary | ICD-10-CM

## 2015-11-16 NOTE — Progress Notes (Signed)
Patient: Edward Mejia Male    DOB: 01-Oct-1945   70 y.o.   MRN: [id number] Visit Date: 11/16/2015  Today's Provider: Lelon Huh, MD   Chief Complaint  Patient presents with  . Fatigue   Subjective:    HPI  Patient comes in today c/o ongoing fatigue that has worsened over the last few months. He reports that he gets very short of breath with exertion, and sometimes feel he has to take a deep breath while resting. Patient reports that it is beginning to affect his daily activities (such as combing his hair, shaving, walking around the house, etc.)  Patient also mentions that he feels nauseated after he catches his breath. Patient denies any chest pain, numbness or tingling in his extremities, or unusual swelling in his lower extremities.   He had labs done by rheumatologist 10/09/15 including normal CBC, and hepatic panel. States he is sleeping well throughout the night. He had normal myocardial perfusion scan in 04/06/2015 after an episode of chest pain. He states fatigue started around the same time, but has worsened significantly the last few weeks.       Allergies  Allergen Reactions  . Pravachol  [Pravastatin]     Fatigue     Current Outpatient Prescriptions:  .  etanercept (ENBREL) 50 MG/ML injection, Inject into the skin once a week., Disp: , Rfl:  .  FOLIC ACID PO, Take by mouth daily., Disp: , Rfl:  .  HYDROcodone-acetaminophen (NORCO) 7.5-325 MG tablet, Take 1 tablet by mouth 3 (three) times daily as needed for moderate pain., Disp: 90 tablet, Rfl: 0 .  methotrexate 2.5 MG tablet, Take 1 tablet by mouth 3 (three) times a week., Disp: , Rfl:  .  misoprostol (CYTOTEC) 100 MCG tablet, Take 1 tablet by mouth daily., Disp: , Rfl:  .  MULTIPLE VITAMIN PO, Take 1 tablet by mouth daily., Disp: , Rfl:  .  OMEGA-3 FATTY ACIDS PO, Take 2 capsules by mouth daily., Disp: , Rfl:  .  predniSONE (DELTASONE) 5 MG tablet, Take 1.5 tablets by mouth daily., Disp: , Rfl:   Review  of Systems  Constitutional: Positive for activity change, fatigue and fever.       Has felt feverish.   HENT: Negative.   Respiratory: Positive for shortness of breath. Negative for wheezing.   Cardiovascular: Negative for chest pain, palpitations and leg swelling.  Gastrointestinal: Positive for nausea.  Musculoskeletal: Positive for arthralgias, back pain, gait problem and myalgias.  Neurological: Positive for weakness and light-headedness.    Social History  Substance Use Topics  . Smoking status: Never Smoker  . Smokeless tobacco: Not on file  . Alcohol use No   Objective:   BP 138/74   Pulse 84   Temp 99 F (37.2 C)   Resp 20   Wt 221 lb (100.2 kg)   SpO2 96%   BMI 29.16 kg/m   Physical Exam   General Appearance:    Alert, cooperative, no distress  Eyes:    PERRL, conjunctiva/corneas clear, EOM's intact       Lungs:     Clear to auscultation bilaterally, respirations unlabored  Heart:    Regular rate and rhythm  Neurologic:   Awake, alert, oriented x 3. No apparent focal neurological           defect.           Assessment & Plan:     1. Dyspnea on exertion  -  Brain natriuretic peptide  2. Fatigue, unspecified type  - CBC - Comprehensive metabolic panel - TSH  3. Psoriatic arthritis ([place])  - Sed Rate (ESR)  4. Myalgia  - CK (Creatine Kinase) - Sed Rate (ESR) - C-reactive protein       Lelon Huh, MD  [facility]

## 2015-11-17 LAB — CBC
Hematocrit: 52.3 % — ABNORMAL HIGH (ref 37.5–51.0)
Hemoglobin: 17.4 g/dL (ref 12.6–17.7)
MCH: 30.6 pg (ref 26.6–33.0)
MCHC: 33.3 g/dL (ref 31.5–35.7)
MCV: 92 fL (ref 79–97)
PLATELETS: 354 10*3/uL (ref 150–379)
RBC: 5.69 x10E6/uL (ref 4.14–5.80)
RDW: 16.1 % — AB (ref 12.3–15.4)
WBC: 10.4 10*3/uL (ref 3.4–10.8)

## 2015-11-17 LAB — COMPREHENSIVE METABOLIC PANEL
A/G RATIO: 2 (ref 1.2–2.2)
ALT: 21 IU/L (ref 0–44)
AST: 21 IU/L (ref 0–40)
Albumin: 4.1 g/dL (ref 3.5–4.8)
Alkaline Phosphatase: 45 IU/L (ref 39–117)
BILIRUBIN TOTAL: 1.5 mg/dL — AB (ref 0.0–1.2)
BUN/Creatinine Ratio: 11 (ref 10–24)
BUN: 13 mg/dL (ref 8–27)
CALCIUM: 8.8 mg/dL (ref 8.6–10.2)
CHLORIDE: 97 mmol/L (ref 96–106)
CO2: 26 mmol/L (ref 18–29)
Creatinine, Ser: 1.22 mg/dL (ref 0.76–1.27)
GFR calc Af Amer: 69 mL/min/{1.73_m2} (ref >59)
GFR, EST NON AFRICAN AMERICAN: 60 mL/min/{1.73_m2} (ref >59)
GLUCOSE: 73 mg/dL (ref 65–99)
Globulin, Total: 2.1 g/dL (ref 1.5–4.5)
POTASSIUM: 5.5 mmol/L — AB (ref 3.5–5.2)
Sodium: 137 mmol/L (ref 134–144)
Total Protein: 6.2 g/dL (ref 6.0–8.5)

## 2015-11-17 LAB — BRAIN NATRIURETIC PEPTIDE: BNP: 39.3 pg/mL (ref 0.0–100.0)

## 2015-11-17 LAB — SEDIMENTATION RATE: Sed Rate: 2 mm/hr (ref 0–30)

## 2015-11-17 LAB — C-REACTIVE PROTEIN: CRP: 10 mg/L — ABNORMAL HIGH (ref 0.0–4.9)

## 2015-11-17 LAB — TSH: TSH: 2.3 u[IU]/mL (ref 0.450–4.500)

## 2015-11-17 LAB — CK: CK TOTAL: 27 U/L (ref 24–204)

## 2015-11-19 DIAGNOSIS — M5416 Radiculopathy, lumbar region: Secondary | ICD-10-CM | POA: Diagnosis not present

## 2015-11-20 ENCOUNTER — Telehealth: Payer: Self-pay | Admitting: Family Medicine

## 2015-11-20 DIAGNOSIS — R5383 Other fatigue: Secondary | ICD-10-CM

## 2015-11-20 DIAGNOSIS — R0602 Shortness of breath: Secondary | ICD-10-CM

## 2015-11-20 NOTE — Telephone Encounter (Signed)
Pt wife is calling to requesting lab results.  [phone]

## 2015-11-20 NOTE — Telephone Encounter (Signed)
-----   Message from Birdie Sons, MD sent at 11/20/2015 11:09 AM EDT ----- Labs are all normal. No explanation for shortness of breath. May want to get referral to pulmonologist for further evaluation

## 2015-11-20 NOTE — Telephone Encounter (Signed)
Advised patient's wife of results. Patient's wife reports that the patient's main complaint is him feeling really fatigued not his shortness of breath. Do you think the pulmonologist will be the best route to go? Or should he see a different specialist? Please advise. Thanks!

## 2015-11-20 NOTE — Telephone Encounter (Signed)
Please review. Thanks!  

## 2015-11-22 NOTE — Telephone Encounter (Signed)
Fatigue and shortness of breath may be related to lung problems. Suggest he go ahead and get referral to pulmonology for evaluation.

## 2015-11-23 NOTE — Telephone Encounter (Signed)
Please schedule referral to pulmonology. Thanks! 

## 2015-11-23 NOTE — Telephone Encounter (Signed)
[name] was notified and wants to go ahead with referral.

## 2015-11-25 DIAGNOSIS — L820 Inflamed seborrheic keratosis: Secondary | ICD-10-CM | POA: Diagnosis not present

## 2015-11-25 DIAGNOSIS — D045 Carcinoma in situ of skin of trunk: Secondary | ICD-10-CM | POA: Diagnosis not present

## 2015-11-25 DIAGNOSIS — Z85828 Personal history of other malignant neoplasm of skin: Secondary | ICD-10-CM | POA: Diagnosis not present

## 2015-11-26 DIAGNOSIS — Z23 Encounter for immunization: Secondary | ICD-10-CM | POA: Diagnosis not present

## 2015-12-02 ENCOUNTER — Other Ambulatory Visit: Payer: Self-pay | Admitting: Family Medicine

## 2015-12-02 DIAGNOSIS — L4050 Arthropathic psoriasis, unspecified: Secondary | ICD-10-CM

## 2015-12-02 MED ORDER — HYDROCODONE-ACETAMINOPHEN 7.5-325 MG PO TABS: 1.0000 | ORAL_TABLET | Freq: Three times a day (TID) | ORAL | 0 refills | Status: DC | PRN

## 2015-12-02 NOTE — Telephone Encounter (Signed)
Pt contacted office for refill request on the following medications:  HYDROcodone-acetaminophen (NORCO) 7.5-325 MG tablet.  CB#[phone]/MW °

## 2015-12-14 ENCOUNTER — Institutional Professional Consult (permissible substitution): Payer: Medicare Other | Admitting: Pulmonary Disease

## 2015-12-30 ENCOUNTER — Telehealth: Payer: Self-pay | Admitting: Family Medicine

## 2015-12-30 DIAGNOSIS — L4050 Arthropathic psoriasis, unspecified: Secondary | ICD-10-CM

## 2015-12-30 MED ORDER — HYDROCODONE-ACETAMINOPHEN 7.5-325 MG PO TABS: 1.0000 | ORAL_TABLET | Freq: Three times a day (TID) | ORAL | 0 refills | Status: DC | PRN

## 2015-12-30 NOTE — Telephone Encounter (Signed)
Pt needs refill on his   HYDROcodone-acetaminophen (NORCO) 7.5-325 MG tablet  Thanks. teri

## 2015-12-30 NOTE — Telephone Encounter (Signed)
Please review. KW 

## 2016-01-11 DIAGNOSIS — Z79899 Other long term (current) drug therapy: Secondary | ICD-10-CM | POA: Diagnosis not present

## 2016-01-19 DIAGNOSIS — L4050 Arthropathic psoriasis, unspecified: Secondary | ICD-10-CM | POA: Diagnosis not present

## 2016-01-19 DIAGNOSIS — Z79899 Other long term (current) drug therapy: Secondary | ICD-10-CM | POA: Diagnosis not present

## 2016-01-26 ENCOUNTER — Other Ambulatory Visit: Payer: Self-pay | Admitting: Family Medicine

## 2016-01-26 DIAGNOSIS — L4050 Arthropathic psoriasis, unspecified: Secondary | ICD-10-CM

## 2016-01-26 MED ORDER — HYDROCODONE-ACETAMINOPHEN 7.5-325 MG PO TABS: 1.0000 | ORAL_TABLET | Freq: Three times a day (TID) | ORAL | 0 refills | Status: DC | PRN

## 2016-01-26 NOTE — Telephone Encounter (Signed)
Pt needs refill on his   HYDROcodone-acetaminophen (NORCO) 7.5-325 MG tablet  Thanks Con Memos

## 2016-01-26 NOTE — Telephone Encounter (Signed)
11/16/15 last ov Last filled 12/30/15

## 2016-02-26 ENCOUNTER — Other Ambulatory Visit: Payer: Self-pay | Admitting: Family Medicine

## 2016-02-26 DIAGNOSIS — L4050 Arthropathic psoriasis, unspecified: Secondary | ICD-10-CM

## 2016-02-26 MED ORDER — HYDROCODONE-ACETAMINOPHEN 7.5-325 MG PO TABS: 1.0000 | ORAL_TABLET | Freq: Three times a day (TID) | ORAL | 0 refills | Status: DC | PRN

## 2016-02-26 NOTE — Telephone Encounter (Signed)
Pt needs refill on his   HYDROcodone-acetaminophen (NORCO) 7.5-325 MG tablet  Thanks Con Memos

## 2016-03-28 ENCOUNTER — Other Ambulatory Visit: Payer: Self-pay | Admitting: Emergency Medicine

## 2016-03-28 DIAGNOSIS — L4050 Arthropathic psoriasis, unspecified: Secondary | ICD-10-CM

## 2016-03-28 MED ORDER — HYDROCODONE-ACETAMINOPHEN 7.5-325 MG PO TABS: 1.0000 | ORAL_TABLET | Freq: Three times a day (TID) | ORAL | 0 refills | Status: DC | PRN

## 2016-03-28 NOTE — Telephone Encounter (Signed)
Pt wife calling to request refill for pt. HYDROcodone-acetaminophen (NORCO) 7.5-325 MG tablet Please call when ready. Thanks.

## 2016-04-08 DIAGNOSIS — Z79899 Other long term (current) drug therapy: Secondary | ICD-10-CM | POA: Diagnosis not present

## 2016-04-19 ENCOUNTER — Ambulatory Visit: Payer: Medicare Other | Admitting: Family Medicine

## 2016-04-19 ENCOUNTER — Ambulatory Visit (INDEPENDENT_AMBULATORY_CARE_PROVIDER_SITE_OTHER): Payer: Medicare Other | Admitting: Family Medicine

## 2016-04-19 ENCOUNTER — Encounter: Payer: Self-pay | Admitting: Family Medicine

## 2016-04-19 ENCOUNTER — Telehealth: Payer: Self-pay | Admitting: Family Medicine

## 2016-04-19 ENCOUNTER — Telehealth: Payer: Self-pay

## 2016-04-19 VITALS — BP 152/84 | HR 70 | Temp 97.9°F | Resp 16

## 2016-04-19 DIAGNOSIS — R42 Dizziness and giddiness: Secondary | ICD-10-CM | POA: Diagnosis not present

## 2016-04-19 DIAGNOSIS — R030 Elevated blood-pressure reading, without diagnosis of hypertension: Secondary | ICD-10-CM | POA: Diagnosis not present

## 2016-04-19 DIAGNOSIS — H532 Diplopia: Secondary | ICD-10-CM

## 2016-04-19 DIAGNOSIS — R270 Ataxia, unspecified: Secondary | ICD-10-CM

## 2016-04-19 MED ORDER — VALSARTAN 80 MG PO TABS: 80.0000 mg | ORAL_TABLET | Freq: Every day | ORAL | 1 refills | Status: DC

## 2016-04-19 NOTE — Telephone Encounter (Signed)
[  please review-aa 

## 2016-04-19 NOTE — Telephone Encounter (Signed)
An order for MRA of neck was entered in EPIC but diagnosis is for dizziness,ataxia.Is this correct test ? Also need to know what you are ruling out,Thanks

## 2016-04-19 NOTE — Telephone Encounter (Signed)
Please advise patient that I sent in prescription for mild blood pressure to take while we are waiting for the MRI to get done.

## 2016-04-19 NOTE — Telephone Encounter (Signed)
Need to rule out cerebellar vascular disease, stroke, and mass.

## 2016-04-19 NOTE — Progress Notes (Signed)
Patient: Edward Mejia    DOB: 05-Jan-1946   71 y.o.   MRN: [id number] Visit Date: 04/19/2016  Today's Provider: Lelon Huh, MD   Chief Complaint  Patient presents with  . Dizziness   Subjective:    Dizziness  This is a recurrent problem. Episode onset: 4 months. The problem occurs intermittently. The problem has been gradually worsening. Associated symptoms include diaphoresis. Pertinent negatives include no abdominal pain, chest pain, chills, fever, nausea or vomiting. The symptoms are aggravated by standing. He has tried nothing for the symptoms.  Patient reports that he has been having dizzy spells for the past 4 months. He reports that these symptoms have been worsening. This past weekend he experienced an episode that caused his to break out in a sweat and become pale. This episode last from a few seconds up to 10 minutes then resolve. Patient describes dizziness as if the room is spinning and unable to maintain balance. Spells persist even when not moving or moving head. A few episodes have been associated with double vision. Once dizziness resolves, he usually feels completely normal. Patient states his blood pressure has been elevated during these episodes; as high as 194/ 98 this past weekend.  He had normal CBC, and Met C ordered by Dr. Esperanza Sheets a few weeks ago.     Allergies  Allergen Reactions  . Pravachol  [Pravastatin]     Fatigue     Current Outpatient Prescriptions:  .  etanercept (ENBREL) 50 MG/ML injection, Inject into the skin once a week., Disp: , Rfl:  .  FOLIC ACID PO, Take by mouth daily., Disp: , Rfl:  .  HYDROcodone-acetaminophen (NORCO) 7.5-325 MG tablet, Take 1 tablet by mouth 3 (three) times daily as needed for moderate pain., Disp: 90 tablet, Rfl: 0 .  methotrexate 2.5 MG tablet, Take 1 tablet by mouth 3 (three) times a week., Disp: , Rfl:  .  misoprostol (CYTOTEC) 100 MCG tablet, Take 1 tablet by mouth daily., Disp: , Rfl:  .  MULTIPLE  VITAMIN PO, Take 1 tablet by mouth daily., Disp: , Rfl:  .  OMEGA-3 FATTY ACIDS PO, Take 2 capsules by mouth daily., Disp: , Rfl:  .  predniSONE (DELTASONE) 5 MG tablet, Take 1.5 tablets by mouth daily., Disp: , Rfl:   Review of Systems  Constitutional: Positive for diaphoresis. Negative for appetite change, chills and fever.  Respiratory: Negative for chest tightness, shortness of breath and wheezing.   Cardiovascular: Negative for chest pain and palpitations.  Gastrointestinal: Negative for abdominal pain, nausea and vomiting.  Neurological: Positive for dizziness and light-headedness.    Social History  Substance Use Topics  . Smoking status: Never Smoker  . Smokeless tobacco: Never Used  . Alcohol use No   Objective:   BP (!) 152/84 (BP Location: Left Arm, Patient Position: Sitting, Cuff Size: Large)   Pulse 70   Temp 97.9 F (36.6 C) (Oral)   Resp 16   SpO2 98% Comment: room air Vitals:   04/19/16 1345  BP: (!) 152/84  Pulse: 70  Resp: 16  Temp: 97.9 F (36.6 C)  TempSrc: Oral  SpO2: 98%     Physical Exam   General Appearance:    Alert, cooperative, no distress  Eyes:    PERRL, conjunctiva/corneas clear, EOM's intact       Lungs:     Clear to auscultation bilaterally, respirations unlabored  Heart:    Regular rate and rhythm  Neurologic:  Awake, alert, oriented x 3. No apparent focal neurological           defect. No nystagmus. Normal finger to nose. EOMI. CN II-XII grossly intact. Normal strength and tone.            Assessment & Plan:     1. Dizziness Currently asymptomatic, but symptoms highly concerning for cerebellar TIA.  - EKG 12-Lead - MR Angiogram Neck W Wo Contrast; Future  Advised to start 81mg enteric coated aspirin a day  2. Diplopia  - MR Angiogram Neck W Wo Contrast; Future  3. Ataxia  - MR Angiogram Neck W Wo Contrast; Future  4. Elevated blood pressure.  Start ARB while awaiting MRI.        , MD  [facility] 

## 2016-04-19 NOTE — Telephone Encounter (Signed)
Patient's wife called saying that the patient has had dizziness and lightheadedness X 3 days. She also reports that the patient's BP has been going up and down. She reports that his BP this morning was 174/94. He denies any shortness of breath with exertion, chest pain, nausea, or numbness and tingling in his extremities. Patient does feel fatigue and weakness. Appt was scheduled for this afternoon at 1:45. I advised patient and wife if symptoms worsen that they should go to the ER instead. They verbalized understanding.

## 2016-04-20 ENCOUNTER — Telehealth: Payer: Self-pay | Admitting: Family Medicine

## 2016-04-20 ENCOUNTER — Other Ambulatory Visit: Payer: Self-pay | Admitting: Family Medicine

## 2016-04-20 DIAGNOSIS — R42 Dizziness and giddiness: Secondary | ICD-10-CM

## 2016-04-20 DIAGNOSIS — R270 Ataxia, unspecified: Secondary | ICD-10-CM

## 2016-04-20 DIAGNOSIS — H539 Unspecified visual disturbance: Secondary | ICD-10-CM

## 2016-04-20 DIAGNOSIS — H532 Diplopia: Secondary | ICD-10-CM

## 2016-04-20 NOTE — Telephone Encounter (Signed)
Please advise 

## 2016-04-20 NOTE — Telephone Encounter (Signed)
Patient's wife [name] advised.

## 2016-04-20 NOTE — Telephone Encounter (Signed)
Insurance company would like to speak peer to peer to get MRA approved.Phone [phone].Member # [id number]

## 2016-04-21 NOTE — Telephone Encounter (Signed)
MRA approved, but no MRI. confirmation number [id number]. Have entered new order for MRA.

## 2016-04-25 ENCOUNTER — Ambulatory Visit
Admission: RE | Admit: 2016-04-25 | Discharge: 2016-04-25 | Disposition: A | Discharge status: Home / Self Care | Payer: Medicare Other | Source: Ambulatory Visit | Attending: Family Medicine | Admitting: Family Medicine

## 2016-04-25 ENCOUNTER — Other Ambulatory Visit: Payer: Self-pay | Admitting: Family Medicine

## 2016-04-25 ENCOUNTER — Telehealth: Payer: Self-pay | Admitting: *Deleted

## 2016-04-25 DIAGNOSIS — R42 Dizziness and giddiness: Secondary | ICD-10-CM | POA: Diagnosis present

## 2016-04-25 DIAGNOSIS — H539 Unspecified visual disturbance: Secondary | ICD-10-CM | POA: Diagnosis present

## 2016-04-25 DIAGNOSIS — L4050 Arthropathic psoriasis, unspecified: Secondary | ICD-10-CM

## 2016-04-25 LAB — POCT I-STAT CREATININE: Creatinine, Ser: 1.1 mg/dL (ref 0.61–1.24)

## 2016-04-25 MED ORDER — HYDROCODONE-ACETAMINOPHEN 7.5-325 MG PO TABS: 1.0000 | ORAL_TABLET | Freq: Three times a day (TID) | ORAL | 0 refills | Status: DC | PRN

## 2016-04-25 MED ORDER — GADOBENATE DIMEGLUMINE 529 MG/ML IV SOLN
20.0000 mL | Freq: Once | INTRAVENOUS | Status: AC | PRN
  Administered 2016-04-25: 20 mL via INTRAVENOUS

## 2016-04-25 NOTE — Telephone Encounter (Signed)
Patient's wife [name] was notified of results. [name] stated that since pt started blood pressure medication, he has had no more dizziness or shadows over his eyes.

## 2016-04-25 NOTE — Telephone Encounter (Signed)
Pt's wife [name] contacted office for refill request on the following medications: HYDROcodone-acetaminophen ([place]) 7.5-325 MG tablet Last Rx: 03/28/16 Please advise. Thanks TNP

## 2016-04-25 NOTE — Telephone Encounter (Signed)
-----   Message from Birdie Sons, MD sent at 04/25/2016  1:04 PM EDT ----- MRI is completely normal. If still having dizzy spells will need referral to neurology.

## 2016-04-27 NOTE — Telephone Encounter (Signed)
Ok, please have patient follow up in 2-3 weeks for BP check.

## 2016-04-27 NOTE — Telephone Encounter (Signed)
Spoke with [name] patient's wife and scheduled appointment.

## 2016-05-02 DIAGNOSIS — D045 Carcinoma in situ of skin of trunk: Secondary | ICD-10-CM | POA: Diagnosis not present

## 2016-05-02 DIAGNOSIS — L821 Other seborrheic keratosis: Secondary | ICD-10-CM | POA: Diagnosis not present

## 2016-05-02 DIAGNOSIS — L570 Actinic keratosis: Secondary | ICD-10-CM | POA: Diagnosis not present

## 2016-05-02 DIAGNOSIS — Z85828 Personal history of other malignant neoplasm of skin: Secondary | ICD-10-CM | POA: Diagnosis not present

## 2016-05-02 DIAGNOSIS — D225 Melanocytic nevi of trunk: Secondary | ICD-10-CM | POA: Diagnosis not present

## 2016-05-09 DIAGNOSIS — Z85828 Personal history of other malignant neoplasm of skin: Secondary | ICD-10-CM | POA: Diagnosis not present

## 2016-05-09 DIAGNOSIS — D045 Carcinoma in situ of skin of trunk: Secondary | ICD-10-CM | POA: Diagnosis not present

## 2016-05-18 ENCOUNTER — Ambulatory Visit (INDEPENDENT_AMBULATORY_CARE_PROVIDER_SITE_OTHER): Payer: Medicare Other | Admitting: Family Medicine

## 2016-05-18 ENCOUNTER — Encounter: Payer: Self-pay | Admitting: Family Medicine

## 2016-05-18 DIAGNOSIS — I10 Essential (primary) hypertension: Secondary | ICD-10-CM | POA: Diagnosis not present

## 2016-05-18 DIAGNOSIS — L4050 Arthropathic psoriasis, unspecified: Secondary | ICD-10-CM

## 2016-05-18 MED ORDER — HYDROCODONE-ACETAMINOPHEN 7.5-325 MG PO TABS: 1.0000 | ORAL_TABLET | Freq: Three times a day (TID) | ORAL | 0 refills | Status: DC | PRN

## 2016-05-18 NOTE — Progress Notes (Signed)
Patient: Edward Mejia Male    DOB: June 02, 1945   71 y.o.   MRN: [id number] Visit Date: 05/18/2016  Today's Provider: Lelon Huh, MD   Chief Complaint  Patient presents with  . Follow-up  . Hypertension   Subjective:    HPI  Elevated blood pressure.  From 04/19/2016 when he had visual changes and dizziness associated with elevated blood pressure of 152/84. Had MRA to rule cerebral vascular disease which was unremarkable. Feels much better now and has had no more dizzy spells.  Allergies  Allergen Reactions  . Pravachol  [Pravastatin]     Fatigue     Current Outpatient Prescriptions:  .  etanercept (ENBREL) 50 MG/ML injection, Inject into the skin once a week., Disp: , Rfl:  .  FOLIC ACID PO, Take by mouth daily., Disp: , Rfl:  .  HYDROcodone-acetaminophen (NORCO) 7.5-325 MG tablet, Take 1 tablet by mouth 3 (three) times daily as needed for moderate pain., Disp: 90 tablet, Rfl: 0 .  methotrexate 2.5 MG tablet, Take 1 tablet by mouth 3 (three) times a week., Disp: , Rfl:  .  misoprostol (CYTOTEC) 100 MCG tablet, Take 1 tablet by mouth daily., Disp: , Rfl:  .  MULTIPLE VITAMIN PO, Take 1 tablet by mouth daily., Disp: , Rfl:  .  OMEGA-3 FATTY ACIDS PO, Take 2 capsules by mouth daily., Disp: , Rfl:  .  predniSONE (DELTASONE) 5 MG tablet, Take 1.5 tablets by mouth daily., Disp: , Rfl:  .  valsartan (DIOVAN) 80 MG tablet, Take 1 tablet (80 mg total) by mouth daily. For blood pressure, Disp: 30 tablet, Rfl: 1  Review of Systems  Constitutional: Negative for appetite change, chills and fever.  Respiratory: Negative for chest tightness, shortness of breath and wheezing.   Cardiovascular: Negative for chest pain and palpitations.  Gastrointestinal: Negative for abdominal pain, nausea and vomiting.    Social History  Substance Use Topics  . Smoking status: Never Smoker  . Smokeless tobacco: Never Used  . Alcohol use No   Objective:   BP (!) 120/54 (BP Location:  Right Arm, Patient Position: Sitting, Cuff Size: Large)   Pulse 71   Temp 97.8 F (36.6 C) (Oral)   Resp 16   Ht 6\' 1"  (1.854 m)   Wt 221 lb (100.2 kg)   SpO2 98%   BMI 29.16 kg/m  Vitals:   05/18/16 1120  BP: (!) 120/54  Pulse: 71  Resp: 16  Temp: 97.8 F (36.6 C)  TempSrc: Oral  SpO2: 98%  Weight: 221 lb (100.2 kg)  Height: 6\' 1"  (1.854 m)     Physical Exam   General Appearance:    Alert, cooperative, no distress  Eyes:    PERRL, conjunctiva/corneas clear, EOM's intact       Lungs:     Clear to auscultation bilaterally, respirations unlabored  Heart:    Regular rate and rhythm  Neurologic:   Awake, alert, oriented x 3. No apparent focal neurological           defect.           Assessment & Plan:      1. Essential hypertension Much better with addition of valsartan. Continue current medications.  Check  - Renal function panel  2. Psoriatic arthritis ([place]) Pain is adequately controlled on current medications.  - HYDROcodone-acetaminophen (NORCO) 7.5-325 MG tablet; Take 1 tablet by mouth 3 (three) times daily as needed for moderate pain.  Dispense: 90  tablet; Refill: 0   The entirety of the information documented in the History of Present Illness, Review of Systems and Physical Exam were personally obtained by me. Portions of this information were initially documented by April M. Sabra Heck, CMA and reviewed by me for thoroughness and accuracy.        Lelon Huh, MD  [facility]

## 2016-05-19 ENCOUNTER — Other Ambulatory Visit: Payer: Self-pay

## 2016-05-19 LAB — RENAL FUNCTION PANEL
ALBUMIN: 4.2 g/dL (ref 3.5–4.8)
BUN / CREAT RATIO: 9 — AB (ref 10–24)
BUN: 8 mg/dL (ref 8–27)
CHLORIDE: 101 mmol/L (ref 96–106)
CO2: 24 mmol/L (ref 18–29)
Calcium: 8.7 mg/dL (ref 8.6–10.2)
Creatinine, Ser: 0.91 mg/dL (ref 0.76–1.27)
GFR calc non Af Amer: 84 mL/min/{1.73_m2} (ref >59)
GFR, EST AFRICAN AMERICAN: 98 mL/min/{1.73_m2} (ref >59)
Glucose: 69 mg/dL (ref 65–99)
Phosphorus: 2.7 mg/dL (ref 2.5–4.5)
Potassium: 4.4 mmol/L (ref 3.5–5.2)
Sodium: 141 mmol/L (ref 134–144)

## 2016-05-19 MED ORDER — VALSARTAN-HYDROCHLOROTHIAZIDE 80-12.5 MG PO TABS: 1.0000 | ORAL_TABLET | Freq: Every day | ORAL | 12 refills | Status: DC

## 2016-05-19 NOTE — Progress Notes (Signed)
Advised  And medications updated and sent to pharmacy  ED

## 2016-06-23 ENCOUNTER — Other Ambulatory Visit: Payer: Self-pay | Admitting: Family Medicine

## 2016-06-23 DIAGNOSIS — L4050 Arthropathic psoriasis, unspecified: Secondary | ICD-10-CM

## 2016-06-23 NOTE — Telephone Encounter (Signed)
Last refill 05/18/2016. LOV 05/18/2016. Renaldo Fiddler, CMA

## 2016-06-23 NOTE — Telephone Encounter (Signed)
Pt contacted office for refill request on the following medications:  HYDROcodone-acetaminophen (NORCO) 7.5-325 MG tablet.  CB#[phone]/[id number]

## 2016-06-24 MED ORDER — HYDROCODONE-ACETAMINOPHEN 7.5-325 MG PO TABS: 1.0000 | ORAL_TABLET | Freq: Three times a day (TID) | ORAL | 0 refills | Status: DC | PRN

## 2016-07-01 ENCOUNTER — Telehealth: Payer: Self-pay | Admitting: Family Medicine

## 2016-07-01 NOTE — Telephone Encounter (Signed)
An order for MRA of neck and MRA of brain was ordered for pt March 2018.This case went up for peer to peer discussion and message was sent saying it was approved.I ordered last test that was entered into EPIC which was MRA of neck and the approval was for MRA of brain.Pt is now getting billed because of no auth and wife wants to know if MRA of brain still needs to be done

## 2016-07-02 NOTE — Telephone Encounter (Signed)
Can you check on this. I originally ordered MRA of neck on 3-13. Insurance required peer to peer review which I did on 3-15. I was told on the peer to peer that the MRA was approved. We specifically want to visualize the blood vessels of the neck because he has having episodes of blurry vision and dizziness suggesting vertebral artery ischemia. However the faxed approval from [facility] on 04/21/2016 says MRA head blood vessel, (not neck)

## 2016-07-05 NOTE — Telephone Encounter (Signed)
No. We were just looking for blockages in arteries in neck going to brain and everything was normal.

## 2016-07-05 NOTE — Telephone Encounter (Signed)
The wife would like to know if pt will still need to get MRA of brain done.

## 2016-07-05 NOTE — Telephone Encounter (Signed)
Pt's wife [name] advised that Dr Caryn Section does not wish to order MRA of brain.I also spoke with Shanita H in billing department at [facility] who states that pt will not have to cover bill since there was no auth.(Phone [phone]) I was also advised of this by Lilia Pro L at [employer]

## 2016-07-12 DIAGNOSIS — Z79899 Other long term (current) drug therapy: Secondary | ICD-10-CM | POA: Diagnosis not present

## 2016-07-21 ENCOUNTER — Other Ambulatory Visit: Payer: Self-pay | Admitting: Family Medicine

## 2016-07-21 DIAGNOSIS — L4050 Arthropathic psoriasis, unspecified: Secondary | ICD-10-CM

## 2016-07-21 NOTE — Telephone Encounter (Signed)
Pt contacted office for refill request on the following medications:  HYDROcodone-acetaminophen (NORCO) 7.5-325 MG tablet.  IX#[phone]

## 2016-07-25 MED ORDER — HYDROCODONE-ACETAMINOPHEN 7.5-325 MG PO TABS: 1.0000 | ORAL_TABLET | Freq: Three times a day (TID) | ORAL | 0 refills | Status: DC | PRN

## 2016-07-25 NOTE — Telephone Encounter (Signed)
Pt's wife has called back regarding med.  He is almost out of his rx.  Con Memos

## 2016-07-27 DIAGNOSIS — Z79899 Other long term (current) drug therapy: Secondary | ICD-10-CM | POA: Diagnosis not present

## 2016-07-27 DIAGNOSIS — L4050 Arthropathic psoriasis, unspecified: Secondary | ICD-10-CM | POA: Diagnosis not present

## 2016-08-23 ENCOUNTER — Encounter (INDEPENDENT_AMBULATORY_CARE_PROVIDER_SITE_OTHER): Payer: Self-pay

## 2016-08-23 ENCOUNTER — Ambulatory Visit (INDEPENDENT_AMBULATORY_CARE_PROVIDER_SITE_OTHER): Payer: Medicare Other | Admitting: Family Medicine

## 2016-08-23 ENCOUNTER — Encounter: Payer: Self-pay | Admitting: Family Medicine

## 2016-08-23 VITALS — BP 110/64 | HR 76 | Temp 98.2°F | Resp 16 | Ht 73.0 in | Wt 221.0 lb

## 2016-08-23 DIAGNOSIS — L4050 Arthropathic psoriasis, unspecified: Secondary | ICD-10-CM

## 2016-08-23 DIAGNOSIS — I10 Essential (primary) hypertension: Secondary | ICD-10-CM

## 2016-08-23 DIAGNOSIS — Z6829 Body mass index (BMI) 29.0-29.9, adult: Secondary | ICD-10-CM

## 2016-08-23 MED ORDER — HYDROCODONE-ACETAMINOPHEN 7.5-325 MG PO TABS: 1.0000 | ORAL_TABLET | Freq: Three times a day (TID) | ORAL | 0 refills | Status: DC | PRN

## 2016-08-23 NOTE — Progress Notes (Signed)
Patient: Edward Mejia Male    DOB: [birth year]   71 y.o.   MRN: [id number] Visit Date: 08/23/2016  Today's Provider: Lelon Huh, MD   Chief Complaint  Patient presents with  . Follow-up  . Diabetes   Subjective:    HPI   Hypertension, follow-up:  BP Readings from Last 3 Encounters:  08/23/16 110/64  05/18/16 (!) 120/54  04/19/16 (!) 152/84    He was last seen for hypertension 3 months ago.  BP at that visit was 120/54. Management since since that visit includes; changed valsartan to valsartan-HCTZ 80-12.5 mg qd.advised to follow-up in 3 months to check blood pressure.He reports good compliance with treatment. He is not having side effects. none He is exercising. He is adherent to low salt diet.   Outside blood pressures are 134/82-151/84. He is experiencing none.  Patient denies none.   Cardiovascular risk factors include none.  Use of agents associated with hypertension: none.   ----------------------------------------------------------------   Psoriatic arthritis ([place]) From 05/18/2016-no changes.     Allergies  Allergen Reactions  . Pravachol  [Pravastatin]     Fatigue     Current Outpatient Prescriptions:  .  etanercept (ENBREL) 50 MG/ML injection, Inject into the skin once a week., Disp: , Rfl:  .  FOLIC ACID PO, Take by mouth daily., Disp: , Rfl:  .  HYDROcodone-acetaminophen (NORCO) 7.5-325 MG tablet, Take 1 tablet by mouth 3 (three) times daily as needed for moderate pain., Disp: 90 tablet, Rfl: 0 .  methotrexate 2.5 MG tablet, Take 1 tablet by mouth 3 (three) times a week., Disp: , Rfl:  .  misoprostol (CYTOTEC) 100 MCG tablet, Take 1 tablet by mouth daily., Disp: , Rfl:  .  MULTIPLE VITAMIN PO, Take 1 tablet by mouth daily., Disp: , Rfl:  .  OMEGA-3 FATTY ACIDS PO, Take 2 capsules by mouth daily., Disp: , Rfl:  .  predniSONE (DELTASONE) 5 MG tablet, Take 1.5 tablets by mouth daily., Disp: , Rfl:  .  valsartan-hydrochlorothiazide  (DIOVAN-HCT) 80-12.5 MG tablet, Take 1 tablet by mouth daily., Disp: 30 tablet, Rfl: 12  Review of Systems  Constitutional: Positive for fatigue. Negative for appetite change, chills and fever.  Respiratory: Negative for chest tightness, shortness of breath and wheezing.   Cardiovascular: Negative for chest pain and palpitations.  Gastrointestinal: Negative for abdominal pain, nausea and vomiting.    Social History  Substance Use Topics  . Smoking status: Never Smoker  . Smokeless tobacco: Never Used  . Alcohol use No   Objective:   BP 110/64 (BP Location: Right Arm, Patient Position: Sitting, Cuff Size: Large)   Pulse 76   Temp 98.2 F (36.8 C) (Oral)   Resp 16   Ht 6\' 1"  (1.854 m)   Wt 221 lb (100.2 kg)   SpO2 97%   BMI 29.16 kg/m  Vitals:   08/23/16 1352  BP: 110/64  Pulse: 76  Resp: 16  Temp: 98.2 F (36.8 C)  TempSrc: Oral  SpO2: 97%  Weight: 221 lb (100.2 kg)  Height: 6\' 1"  (1.854 m)     Physical Exam   General Appearance:    Alert, cooperative, no distress  Eyes:    PERRL, conjunctiva/corneas clear, EOM's intact       Lungs:     Clear to auscultation bilaterally, respirations unlabored  Heart:    Regular rate and rhythm  Neurologic:   Awake, alert, oriented x 3. No apparent focal neurological  defect.           Assessment & Plan:     1. Essential hypertension Well controlled and feeling much better since starting BP medications. Continue current medications.  Advised to discuss valsartan recall with pharmacist   2. BMI 29.0-29.9,adult   3. Psoriatic arthritis ([place])  - HYDROcodone-acetaminophen (NORCO) 7.5-325 MG tablet; Take 1 tablet by mouth 3 (three) times daily as needed for moderate pain.  Dispense: 90 tablet; Refill: 0        Lelon Huh, MD  [facility]

## 2016-09-22 ENCOUNTER — Other Ambulatory Visit: Payer: Self-pay | Admitting: Family Medicine

## 2016-09-22 DIAGNOSIS — L4050 Arthropathic psoriasis, unspecified: Secondary | ICD-10-CM

## 2016-09-22 NOTE — Telephone Encounter (Signed)
Pt needs refill on hydrocodone 7.5-325  Thanks teri

## 2016-09-22 NOTE — Telephone Encounter (Signed)
Please review. Thanks!  

## 2016-09-23 MED ORDER — HYDROCODONE-ACETAMINOPHEN 7.5-325 MG PO TABS: 1.0000 | ORAL_TABLET | Freq: Three times a day (TID) | ORAL | 0 refills | Status: DC | PRN

## 2016-10-12 DIAGNOSIS — Z79899 Other long term (current) drug therapy: Secondary | ICD-10-CM | POA: Diagnosis not present

## 2016-10-19 ENCOUNTER — Other Ambulatory Visit: Payer: Self-pay | Admitting: Family Medicine

## 2016-10-19 DIAGNOSIS — L4050 Arthropathic psoriasis, unspecified: Secondary | ICD-10-CM

## 2016-10-19 MED ORDER — HYDROCODONE-ACETAMINOPHEN 7.5-325 MG PO TABS
1.0000 | ORAL_TABLET | Freq: Three times a day (TID) | ORAL | 0 refills | Status: DC | PRN
Start: 2016-10-19 — End: 2016-11-21

## 2016-10-19 NOTE — Telephone Encounter (Signed)
Pt needs refill on his hydrocodone 7.5/325 ° °Thanks teri °

## 2016-10-27 ENCOUNTER — Encounter: Payer: Self-pay | Admitting: Family Medicine

## 2016-10-27 ENCOUNTER — Ambulatory Visit (INDEPENDENT_AMBULATORY_CARE_PROVIDER_SITE_OTHER): Payer: Medicare Other | Admitting: Family Medicine

## 2016-10-27 VITALS — BP 104/60 | HR 86 | Temp 99.4°F | Resp 17 | Wt 222.2 lb

## 2016-10-27 DIAGNOSIS — D899 Disorder involving the immune mechanism, unspecified: Secondary | ICD-10-CM | POA: Diagnosis not present

## 2016-10-27 DIAGNOSIS — D849 Immunodeficiency, unspecified: Secondary | ICD-10-CM

## 2016-10-27 DIAGNOSIS — J069 Acute upper respiratory infection, unspecified: Secondary | ICD-10-CM | POA: Diagnosis not present

## 2016-10-27 MED ORDER — CEFDINIR 300 MG PO CAPS: 300.0000 mg | ORAL_CAPSULE | Freq: Two times a day (BID) | ORAL | 0 refills | Status: DC

## 2016-10-27 NOTE — Patient Instructions (Signed)
Add Mucinex as an expectorant. May also use Delsym for cough in addition to your usual pain medication.

## 2016-10-27 NOTE — Progress Notes (Signed)
Subjective:     Patient ID: Edward Mejia, male   DOB: 02-11-1945, 71 y.o.   MRN: [id number]  HPI  Chief Complaint  Patient presents with  . Cough    Patient comes in office today with concerns of cough and congestion that began 4 days ago. Patient reports symptoms intermittent of chest congestion, cough productive of phlegm, body chills and runny nose.   States he remains on immunosuppressants and just received an infusion. Felt hot but did not take temperature at home. Accompanied by his wife today   Review of Systems     Objective:   Physical Exam  Constitutional: He appears well-developed and well-nourished. No distress.  Ears: T.M's intact without inflammation Throat: mild erythema without tonsillar enlargement or exudate. Neck: no cervical adenopathy Lungs: Bibasilar coarse crackles which clear with cough.     Assessment:    1. Viral upper respiratory tract infection - cefdinir (OMNICEF) 300 MG capsule; Take 1 capsule (300 mg total) by mouth 2 (two) times daily.  Dispense: 14 capsule; Refill: 0  2. Immunosuppressed status (HCC - cefdinir (OMNICEF) 300 MG capsule; Take 1 capsule (300 mg total) by mouth 2 (two) times daily.  Dispense: 14 capsule; Refill: 0    Plan:    Disucssed use of  Mucinex and Delsym. Currently on hydrocodone routinely which may help with cough suppression.

## 2016-10-31 DIAGNOSIS — L4050 Arthropathic psoriasis, unspecified: Secondary | ICD-10-CM | POA: Diagnosis not present

## 2016-10-31 DIAGNOSIS — M7062 Trochanteric bursitis, left hip: Secondary | ICD-10-CM | POA: Diagnosis not present

## 2016-10-31 DIAGNOSIS — M67311 Transient synovitis, right shoulder: Secondary | ICD-10-CM | POA: Diagnosis not present

## 2016-10-31 DIAGNOSIS — M67312 Transient synovitis, left shoulder: Secondary | ICD-10-CM | POA: Diagnosis not present

## 2016-11-04 DIAGNOSIS — D044 Carcinoma in situ of skin of scalp and neck: Secondary | ICD-10-CM | POA: Diagnosis not present

## 2016-11-04 DIAGNOSIS — D692 Other nonthrombocytopenic purpura: Secondary | ICD-10-CM | POA: Diagnosis not present

## 2016-11-04 DIAGNOSIS — L814 Other melanin hyperpigmentation: Secondary | ICD-10-CM | POA: Diagnosis not present

## 2016-11-04 DIAGNOSIS — D0461 Carcinoma in situ of skin of right upper limb, including shoulder: Secondary | ICD-10-CM | POA: Diagnosis not present

## 2016-11-04 DIAGNOSIS — D225 Melanocytic nevi of trunk: Secondary | ICD-10-CM | POA: Diagnosis not present

## 2016-11-04 DIAGNOSIS — Z85828 Personal history of other malignant neoplasm of skin: Secondary | ICD-10-CM | POA: Diagnosis not present

## 2016-11-05 ENCOUNTER — Encounter: Payer: Self-pay | Admitting: Physician Assistant

## 2016-11-05 ENCOUNTER — Ambulatory Visit (INDEPENDENT_AMBULATORY_CARE_PROVIDER_SITE_OTHER): Payer: Medicare Other | Admitting: Physician Assistant

## 2016-11-05 VITALS — BP 120/70 | HR 76 | Temp 98.9°F | Resp 16 | Wt 219.0 lb

## 2016-11-05 DIAGNOSIS — R091 Pleurisy: Secondary | ICD-10-CM | POA: Diagnosis not present

## 2016-11-05 DIAGNOSIS — J40 Bronchitis, not specified as acute or chronic: Secondary | ICD-10-CM | POA: Diagnosis not present

## 2016-11-05 MED ORDER — BENZONATATE 200 MG PO CAPS: 200.0000 mg | ORAL_CAPSULE | Freq: Three times a day (TID) | ORAL | 0 refills | Status: DC | PRN

## 2016-11-05 MED ORDER — LEVOFLOXACIN 500 MG PO TABS: 500.0000 mg | ORAL_TABLET | Freq: Every day | ORAL | 0 refills | Status: DC

## 2016-11-05 MED ORDER — PREDNISONE 20 MG PO TABS: ORAL_TABLET | ORAL | 0 refills | Status: DC

## 2016-11-05 NOTE — Patient Instructions (Signed)

## 2016-11-05 NOTE — Progress Notes (Signed)
Patient: Edward Mejia Male    DOB: 1945-08-12   71 y.o.   MRN: [id number] Visit Date: 11/05/2016  Today's Provider: Mar Daring, PA-C   Chief Complaint  Patient presents with  . Cough   Subjective:    Patient was seen in office by Carmon Ginsberg 10/27/2016 for cough and congestion. Patient was given cefdinir which he has completed. Patient was advised to add Mucinex as an expectorant, also use Delsym for cough in addition to his usual pain medication. Patient states that he is still coughing up green chunks and has nasal drainage. Also patient states he has coughed so much, he now has a pain in his chest. Pain in chest only occurs while he's coughing. Patient has been taking Delsym with only relief.   He reports that while on the cefdinir the congestion did turn to clear instead of green, but shortly after finishing he began to worsen again. He reports he is not as bad as he was before starting the cefdinir as he has not had as much congestion and has not had a fever.     Allergies  Allergen Reactions  . Pravachol  [Pravastatin]     Fatigue     Current Outpatient Prescriptions:  .  etanercept (ENBREL) 50 MG/ML injection, Inject into the skin once a week., Disp: , Rfl:  .  FOLIC ACID PO, Take by mouth daily., Disp: , Rfl:  .  HYDROcodone-acetaminophen (NORCO) 7.5-325 MG tablet, Take 1 tablet by mouth 3 (three) times daily as needed for moderate pain., Disp: 90 tablet, Rfl: 0 .  methotrexate (RHEUMATREX) 2.5 MG tablet, TAKE 5  TABLETS BY MOUTH EACH WEEK, Disp: , Rfl:  .  misoprostol (CYTOTEC) 100 MCG tablet, Take 1 tablet by mouth daily., Disp: , Rfl:  .  MULTIPLE VITAMIN PO, Take 1 tablet by mouth daily., Disp: , Rfl:  .  OMEGA-3 FATTY ACIDS PO, Take 2 capsules by mouth daily., Disp: , Rfl:  .  predniSONE (DELTASONE) 5 MG tablet, Take 1.5 tablets by mouth daily., Disp: , Rfl:  .  valsartan-hydrochlorothiazide (DIOVAN-HCT) 80-12.5 MG tablet, Take 1 tablet by mouth  daily., Disp: 30 tablet, Rfl: 12  Review of Systems  Constitutional: Negative for appetite change, chills and fever.  HENT: Positive for congestion, postnasal drip and rhinorrhea. Negative for ear pain, sinus pain, sinus pressure, sneezing, sore throat, tinnitus and trouble swallowing.   Respiratory: Positive for cough and wheezing. Negative for chest tightness and shortness of breath.   Cardiovascular: Positive for chest pain (only with coughing). Negative for palpitations.  Gastrointestinal: Negative for abdominal pain, nausea and vomiting.  Neurological: Negative for dizziness, light-headedness and headaches.    Social History  Substance Use Topics  . Smoking status: Never Smoker  . Smokeless tobacco: Never Used  . Alcohol use No   Objective:   BP 120/70 (BP Location: Right Arm, Patient Position: Sitting, Cuff Size: Large)   Pulse 76   Temp 98.9 F (37.2 C) (Oral)   Resp 16   Wt 219 lb (99.3 kg)   SpO2 99%   BMI 28.89 kg/m  Vitals:   11/05/16 0957  BP: 120/70  Pulse: 76  Resp: 16  Temp: 98.9 F (37.2 C)  TempSrc: Oral  SpO2: 99%  Weight: 219 lb (99.3 kg)     Physical Exam  Constitutional: He appears well-developed and well-nourished. No distress.  HENT:  Head: Normocephalic and atraumatic.  Right Ear: Hearing, tympanic membrane, external ear  and ear canal normal. Tympanic membrane is not erythematous and not bulging. No middle ear effusion.  Left Ear: Hearing, tympanic membrane, external ear and ear canal normal. Tympanic membrane is not erythematous and not bulging.  No middle ear effusion.  Nose: Mucosal edema and rhinorrhea present. Right sinus exhibits no maxillary sinus tenderness and no frontal sinus tenderness. Left sinus exhibits no maxillary sinus tenderness and no frontal sinus tenderness.  Mouth/Throat: Uvula is midline, oropharynx is clear and moist and mucous membranes are normal. No oropharyngeal exudate, posterior oropharyngeal edema or posterior  oropharyngeal erythema.  Eyes: Pupils are equal, round, and reactive to light. Conjunctivae and EOM are normal. Right eye exhibits no discharge. Left eye exhibits no discharge.  Neck: Normal range of motion. Neck supple. No tracheal deviation present. No Brudzinski's sign and no Kernig's sign noted. No thyromegaly present.  Cardiovascular: Normal rate, regular rhythm and normal heart sounds.  Exam reveals no gallop and no friction rub.   No murmur heard. Pulmonary/Chest: Effort normal. No stridor. No respiratory distress. He has decreased breath sounds (throughout but most in RLL). He has no wheezes. He has no rales. He exhibits tenderness.  Lymphadenopathy:    He has no cervical adenopathy.  Skin: Skin is warm and dry. He is not diaphoretic.  Vitals reviewed.       Assessment & Plan:     1. Bronchitis Since patient completed Cefdinir and did not have complete resolution of symptoms and is now having more respiratory symptoms I will give levaquin as below. Prednisone taper given as below for inflammation and decreased breath sounds as well as for the pleuritis that is beginning due to cough. Tessalon perles given for cough suppression as Delsym is not working as well. Continue to push fluids. Rest. Call if symptoms do not completely resolve or if they worsen. - levofloxacin (LEVAQUIN) 500 MG tablet; Take 1 tablet (500 mg total) by mouth daily.  Dispense: 10 tablet; Refill: 0 - predniSONE (DELTASONE) 20 MG tablet; Take 5 tabs PO day 1, 4 tabs PO, then 3 tabs PO, then 2 tabs, then 1 tab, then back to regular dosing  Dispense: 15 tablet; Refill: 0 - benzonatate (TESSALON) 200 MG capsule; Take 1 capsule (200 mg total) by mouth 3 (three) times daily as needed for cough.  Dispense: 30 capsule; Refill: 0  2. Pleuritis See above medical treatment plan.       Mar Daring, PA-C  [facility]

## 2016-11-21 ENCOUNTER — Telehealth: Payer: Self-pay | Admitting: Family Medicine

## 2016-11-21 DIAGNOSIS — L4050 Arthropathic psoriasis, unspecified: Secondary | ICD-10-CM

## 2016-11-21 MED ORDER — HYDROCODONE-ACETAMINOPHEN 7.5-325 MG PO TABS: 1.0000 | ORAL_TABLET | Freq: Three times a day (TID) | ORAL | 0 refills | Status: DC | PRN

## 2016-11-21 NOTE — Telephone Encounter (Signed)
Patient needs refill on Hydrocodone.  Call when ready [phone]

## 2016-12-14 ENCOUNTER — Ambulatory Visit (INDEPENDENT_AMBULATORY_CARE_PROVIDER_SITE_OTHER): Payer: Medicare Other | Admitting: Family Medicine

## 2016-12-14 ENCOUNTER — Ambulatory Visit (INDEPENDENT_AMBULATORY_CARE_PROVIDER_SITE_OTHER): Payer: Medicare Other

## 2016-12-14 VITALS — BP 128/68 | HR 72 | Temp 98.7°F | Ht 73.0 in | Wt 222.8 lb

## 2016-12-14 DIAGNOSIS — Z86010 Personal history of colonic polyps: Secondary | ICD-10-CM

## 2016-12-14 DIAGNOSIS — Z0000 Encounter for general adult medical examination without abnormal findings: Secondary | ICD-10-CM

## 2016-12-14 DIAGNOSIS — L4050 Arthropathic psoriasis, unspecified: Secondary | ICD-10-CM | POA: Diagnosis not present

## 2016-12-14 DIAGNOSIS — Z125 Encounter for screening for malignant neoplasm of prostate: Secondary | ICD-10-CM

## 2016-12-14 DIAGNOSIS — Z23 Encounter for immunization: Secondary | ICD-10-CM

## 2016-12-14 LAB — PSA: PSA: 0.6 ng/mL (ref <=4.0)

## 2016-12-14 MED ORDER — HYDROCODONE-ACETAMINOPHEN 7.5-325 MG PO TABS: 1.0000 | ORAL_TABLET | Freq: Three times a day (TID) | ORAL | 0 refills | Status: DC | PRN

## 2016-12-14 NOTE — Patient Instructions (Signed)
Edward Mejia , Thank you for taking time to come for your Medicare Wellness Visit. I appreciate your ongoing commitment to your health goals. Please review the following plan we discussed and let me know if I can assist you in the future.   Screening recommendations/referrals: Colonoscopy: declined Recommended yearly ophthalmology/optometry visit for glaucoma screening and checkup Recommended yearly dental visit for hygiene and checkup  Vaccinations: Influenza vaccine: completed Pneumococcal vaccine: completed series Tdap vaccine: declined Shingles vaccine: declined  Advanced directives: Please bring a copy of your POA (Power of Attorney) and/or Living Will to your next appointment.   Conditions/risks identified: Recommend eating 3 small meals every day with 2 healthy snacks in between.   Next appointment: 2:00 PM today  Preventive Care 72 Years and Older, Male Preventive care refers to lifestyle choices and visits with your health care provider that can promote health and wellness. What does preventive care include?  A yearly physical exam. This is also called an annual well check.  Dental exams once or twice a year.  Routine eye exams. Ask your health care provider how often you should have your eyes checked.  Personal lifestyle choices, including:  Daily care of your teeth and gums.  Regular physical activity.  Eating a healthy diet.  Avoiding tobacco and drug use.  Limiting alcohol use.  Practicing safe sex.  Taking low doses of aspirin every day.  Taking vitamin and mineral supplements as recommended by your health care provider. What happens during an annual well check? The services and screenings done by your health care provider during your annual well check will depend on your age, overall health, lifestyle risk factors, and family history of disease. Counseling  Your health care provider may ask you questions about your:  Alcohol use.  Tobacco  use.  Drug use.  Emotional well-being.  Home and relationship well-being.  Sexual activity.  Eating habits.  History of falls.  Memory and ability to understand (cognition).  Work and work Statistician. Screening  You may have the following tests or measurements:  Height, weight, and BMI.  Blood pressure.  Lipid and cholesterol levels. These may be checked every 5 years, or more frequently if you are over 56 years old.  Skin check.  Lung cancer screening. You may have this screening every year starting at age 13 if you have a 30-pack-year history of smoking and currently smoke or have quit within the past 15 years.  Fecal occult blood test (FOBT) of the stool. You may have this test every year starting at age 31.  Flexible sigmoidoscopy or colonoscopy. You may have a sigmoidoscopy every 5 years or a colonoscopy every 10 years starting at age 82.  Prostate cancer screening. Recommendations will vary depending on your family history and other risks.  Hepatitis C blood test.  Hepatitis B blood test.  Sexually transmitted disease (STD) testing.  Diabetes screening. This is done by checking your blood sugar (glucose) after you have not eaten for a while (fasting). You may have this done every 1-3 years.  Abdominal aortic aneurysm (AAA) screening. You may need this if you are a current or former smoker.  Osteoporosis. You may be screened starting at age 83 if you are at high risk. Talk with your health care provider about your test results, treatment options, and if necessary, the need for more tests. Vaccines  Your health care provider may recommend certain vaccines, such as:  Influenza vaccine. This is recommended every year.  Tetanus, diphtheria, and acellular  pertussis (Tdap, Td) vaccine. You may need a Td booster every 10 years.  Zoster vaccine. You may need this after age 60.  Pneumococcal 13-valent conjugate (PCV13) vaccine. One dose is recommended after age  58.  Pneumococcal polysaccharide (PPSV23) vaccine. One dose is recommended after age 57. Talk to your health care provider about which screenings and vaccines you need and how often you need them. This information is not intended to replace advice given to you by your health care provider. Make sure you discuss any questions you have with your health care provider. Document Released: 02/20/2015 Document Revised: 10/14/2015 Document Reviewed: 11/25/2014 Elsevier Interactive Patient Education  2017 [place] Prevention in the Home Falls can cause injuries. They can happen to people of all ages. There are many things you can do to make your home safe and to help prevent falls. What can I do on the outside of my home?  Regularly fix the edges of walkways and driveways and fix any cracks.  Remove anything that might make you trip as you walk through a door, such as a raised step or threshold.  Trim any bushes or trees on the path to your home.  Use bright outdoor lighting.  Clear any walking paths of anything that might make someone trip, such as rocks or tools.  Regularly check to see if handrails are loose or broken. Make sure that both sides of any steps have handrails.  Any raised decks and porches should have guardrails on the edges.  Have any leaves, snow, or ice cleared regularly.  Use sand or salt on walking paths during winter.  Clean up any spills in your garage right away. This includes oil or grease spills. What can I do in the bathroom?  Use night lights.  Install grab bars by the toilet and in the tub and shower. Do not use towel bars as grab bars.  Use non-skid mats or decals in the tub or shower.  If you need to sit down in the shower, use a plastic, non-slip stool.  Keep the floor dry. Clean up any water that spills on the floor as soon as it happens.  Remove soap buildup in the tub or shower regularly.  Attach bath mats securely with double-sided  non-slip rug tape.  Do not have throw rugs and other things on the floor that can make you trip. What can I do in the bedroom?  Use night lights.  Make sure that you have a light by your bed that is easy to reach.  Do not use any sheets or blankets that are too big for your bed. They should not hang down onto the floor.  Have a firm chair that has side arms. You can use this for support while you get dressed.  Do not have throw rugs and other things on the floor that can make you trip. What can I do in the kitchen?  Clean up any spills right away.  Avoid walking on wet floors.  Keep items that you use a lot in easy-to-reach places.  If you need to reach something above you, use a strong step stool that has a grab bar.  Keep electrical cords out of the way.  Do not use floor polish or wax that makes floors slippery. If you must use wax, use non-skid floor wax.  Do not have throw rugs and other things on the floor that can make you trip. What can I do with my stairs?  Do not leave any items on the stairs.  Make sure that there are handrails on both sides of the stairs and use them. Fix handrails that are broken or loose. Make sure that handrails are as long as the stairways.  Check any carpeting to make sure that it is firmly attached to the stairs. Fix any carpet that is loose or worn.  Avoid having throw rugs at the top or bottom of the stairs. If you do have throw rugs, attach them to the floor with carpet tape.  Make sure that you have a light switch at the top of the stairs and the bottom of the stairs. If you do not have them, ask someone to add them for you. What else can I do to help prevent falls?  Wear shoes that:  Do not have high heels.  Have rubber bottoms.  Are comfortable and fit you well.  Are closed at the toe. Do not wear sandals.  If you use a stepladder:  Make sure that it is fully opened. Do not climb a closed stepladder.  Make sure that both  sides of the stepladder are locked into place.  Ask someone to hold it for you, if possible.  Clearly mark and make sure that you can see:  Any grab bars or handrails.  First and last steps.  Where the edge of each step is.  Use tools that help you move around (mobility aids) if they are needed. These include:  Canes.  Walkers.  Scooters.  Crutches.  Turn on the lights when you go into a dark area. Replace any light bulbs as soon as they burn out.  Set up your furniture so you have a clear path. Avoid moving your furniture around.  If any of your floors are uneven, fix them.  If there are any pets around you, be aware of where they are.  Review your medicines with your doctor. Some medicines can make you feel dizzy. This can increase your chance of falling. Ask your doctor what other things that you can do to help prevent falls. This information is not intended to replace advice given to you by your health care provider. Make sure you discuss any questions you have with your health care provider. Document Released: 11/20/2008 Document Revised: 07/02/2015 Document Reviewed: 02/28/2014 Elsevier Interactive Patient Education  2017 [employer].

## 2016-12-14 NOTE — Patient Instructions (Addendum)
   Please go to the [facility] draw center in [place] on the second floor of [facility] are due for colonoscopy due to having had pre-cancerous polyps on last colonoscopy. Please call our office at your earliest convenience to schedule referral to a gastroenterologist.

## 2016-12-14 NOTE — Progress Notes (Signed)
Subjective:   Edward Mejia is a 71 y.o. male who presents for Medicare Annual/Subsequent preventive examination.  Review of Systems:  N/A  Cardiac Risk Factors include: advanced age (>47men, >62 women);dyslipidemia;hypertension;male gender     Objective:    Vitals: BP 128/68 (BP Location: Left Arm)   Pulse 72   Temp 98.7 F (37.1 C) (Oral)   Ht 6\' 1"  (1.854 m)   Wt 222 lb 12.8 oz (101.1 kg)   BMI 29.39 kg/m   Body mass index is 29.39 kg/m.  Tobacco Social History   Tobacco Use  Smoking Status Never Smoker  Smokeless Tobacco Never Used     Counseling given: Not Answered   Past Medical History:  Diagnosis Date  . H/O adenomatous polyp of colon 03/24/2015  . History of brain disorder: history of amaurosis fugax 03/24/2015  . History of rheumatic fever 03/24/2015   DID Have Rheumatic Fever.    . Peptic ulcer disease    Past Surgical History:  Procedure Laterality Date  . APPENDECTOMY    . MANDIBLE FRACTURE SURGERY     fractured jaw  . PARTIAL GASTRECTOMY     peptic ulcer disease   Family History  Problem Relation Age of Onset  . Cancer Mother        lung cancer  . Heart disease Father 34       heart attack  . Cancer Brother        pancreatic cancer  . Cancer Brother        pancreatic cancer   Social History   Substance and Sexual Activity  Sexual Activity Not on file    Outpatient Encounter Medications as of 12/14/2016  Medication Sig  . etanercept (ENBREL) 50 MG/ML injection Inject into the skin once a week.  Marland Kitchen FOLIC ACID PO Take by mouth daily.  Marland Kitchen HYDROcodone-acetaminophen (NORCO) 7.5-325 MG tablet Take 1 tablet by mouth 3 (three) times daily as needed for moderate pain.  . methotrexate (RHEUMATREX) 2.5 MG tablet TAKE 5  TABLETS BY MOUTH EACH WEEK  . misoprostol (CYTOTEC) 100 MCG tablet Take 1 tablet by mouth daily.  . OMEGA-3 FATTY ACIDS PO Take 2 capsules by mouth daily.  . predniSONE (DELTASONE) 5 MG tablet Take 7.5 mg daily with breakfast  by mouth.  . valsartan (DIOVAN) 80 MG tablet Take 80 mg daily by mouth.  . MULTIPLE VITAMIN PO Take 1 tablet by mouth daily.  . [DISCONTINUED] benzonatate (TESSALON) 200 MG capsule Take 1 capsule (200 mg total) by mouth 3 (three) times daily as needed for cough.  . [DISCONTINUED] levofloxacin (LEVAQUIN) 500 MG tablet Take 1 tablet (500 mg total) by mouth daily.  . [DISCONTINUED] predniSONE (DELTASONE) 20 MG tablet Take 5 tabs PO day 1, 4 tabs PO, then 3 tabs PO, then 2 tabs, then 1 tab, then back to regular dosing  . [DISCONTINUED] predniSONE (DELTASONE) 5 MG tablet Take 1.5 tablets by mouth daily.  . [DISCONTINUED] valsartan-hydrochlorothiazide (DIOVAN-HCT) 80-12.5 MG tablet Take 1 tablet by mouth daily.   No facility-administered encounter medications on file as of 12/14/2016.     Activities of Daily Living In your present state of health, do you have any difficulty performing the following activities: 12/14/2016  Hearing? N  Vision? N  Difficulty concentrating or making decisions? N  Walking or climbing stairs? Y  Comment due to back pain  Dressing or bathing? N  Doing errands, shopping? N  Preparing Food and eating ? N  Using the Toilet? N  In the past six months, have you accidently leaked urine? N  Do you have problems with loss of bowel control? N  Managing your Medications? N  Managing your Finances? N  Housekeeping or managing your Housekeeping? N  Some recent data might be hidden    Patient Care Team: Birdie Sons, MD as PCP - General (Family Medicine) Lorelee Cover., MD as Consulting Physician (Ophthalmology)   Assessment:     Exercise Activities and Dietary recommendations Current Exercise Habits: Home exercise routine, Type of exercise: stretching;strength training/weights, Time (Minutes): 10, Frequency (Times/Week): 6(unless having back pain), Weekly Exercise (Minutes/Week): 60, Intensity: Mild, Exercise limited by: orthopedic condition(s)  Goals    None       Fall Risk Fall Risk  12/14/2016 08/25/2015  Falls in the past year? No No   Depression Screen PHQ 2/9 Scores 12/14/2016 12/14/2016 08/25/2015  PHQ - 2 Score 0 0 0  PHQ- 9 Score 3 - 3    Cognitive Function: Pt declined screening today.        Immunization History  Administered Date(s) Administered  . DTaP 01/20/2005  . Influenza, High Dose Seasonal PF 12/14/2016  . Influenza-Unspecified 11/15/2012  . Pneumococcal Conjugate-13 12/14/2016  . Pneumococcal Polysaccharide-23 01/16/2002, 10/31/2011   Screening Tests Health Maintenance  Topic Date Due  . TETANUS/TDAP  01/20/1964  . COLONOSCOPY  02/11/2016  . INFLUENZA VACCINE  Completed  . Hepatitis C Screening  Completed  . PNA vac Low Risk Adult  Completed      Plan:  I have personally reviewed and addressed the Medicare Annual Wellness questionnaire and have noted the following in the patient's chart:  A. Medical and social history B. Use of alcohol, tobacco or illicit drugs  C. Current medications and supplements D. Functional ability and status E.  Nutritional status F.  Physical activity G. Advance directives H. List of other physicians I.  Hospitalizations, surgeries, and ER visits in previous 12 months J.  [place] such as hearing and vision if needed, cognitive and depression L. Referrals and appointments - none  In addition, I have reviewed and discussed with patient certain preventive protocols, quality metrics, and best practice recommendations. A written personalized care plan for preventive services as well as general preventive health recommendations were provided to patient.  See attached scanned questionnaire for additional information.   Signed,  Fabio Neighbors, LPN Nurse Health Advisor   MD Recommendations: Pt declined the tetanus vaccine and the colonoscopy referral today.

## 2016-12-14 NOTE — Progress Notes (Signed)
Patient: Edward Mejia, Male    DOB: [birth year], 71 y.o.   MRN: [id number] Visit Date: 12/14/2016  Today's Provider: Lelon Huh, MD   Chief Complaint  Patient presents with  . Annual Exam  . Hypertension    follow up  . Hyperlipidemia    follow up   Subjective:    Annual physical exam Edward Mejia is a 71 y.o. male who presents today for health maintenance and complete physical. He feels poorly. He reports exercising 6 days a week. He reports he is sleeping fairly well.  -----------------------------------------------------------------    Hypertension, follow-up:  BP Readings from Last 3 Encounters:  11/05/16 120/70  10/27/16 104/60  08/23/16 110/64    He was last seen for hypertension 4 months ago.  BP at that visit was 110/64. Management since that visit includes; no changes.He reports good compliance with treatment. He is not having side effects.  He is exercising. He is adherent to low salt diet.   Outside blood pressures are checked occasionally. He is experiencing none.  Patient denies chest pain, chest pressure/discomfort, claudication, dyspnea, exertional chest pressure/discomfort, irregular heart beat, lower extremity edema, near-syncope, orthopnea, palpitations, paroxysmal nocturnal dyspnea, syncope and tachypnea.   Cardiovascular risk factors include advanced age (older than 97 for men, 56 for women), dyslipidemia, hypertension and male gender.  Use of agents associated with hypertension: NSAIDS.   ------------------------------------------------------------------------    Lipid/Cholesterol, Follow-up:   Last seen for this 08/25/2015.  Management since that visit includes; labs checked, no changes.  Last Lipid Panel:    Component Value Date/Time   CHOL 115 08/27/2015 0912   TRIG 123 08/27/2015 0912   HDL 29 (L) 08/27/2015 0912   CHOLHDL 4.0 08/27/2015 0912   LDLCALC 61 08/27/2015 0912    He reports good compliance with  treatment. He is not having side effects.   Wt Readings from Last 3 Encounters:  11/05/16 219 lb (99.3 kg)  10/27/16 222 lb 3.2 oz (100.8 kg)  08/23/16 221 lb (100.2 kg)    ------------------------------------------------------------------------  Psoriatic arthritis (HCC) From 08/24/2016-refilled HYDROcodone-acetaminophen (NORCO) 7.5-325 MG tablet. Patient reports good compliance with treatment, good tolerance and fair symptom control.   He had normal CBC and met B panel done by his rheumatologist in September.    Review of Systems  Constitutional: Positive for fatigue. Negative for appetite change, chills and fever.  HENT: Negative for congestion, ear pain, hearing loss, nosebleeds and trouble swallowing.   Eyes: Negative for pain and visual disturbance.  Respiratory: Negative for cough, chest tightness and shortness of breath.   Cardiovascular: Negative for chest pain, palpitations and leg swelling.  Gastrointestinal: Negative for abdominal pain, blood in stool, constipation, diarrhea, nausea and vomiting.  Endocrine: Negative for polydipsia, polyphagia and polyuria.  Genitourinary: Negative for dysuria and flank pain.  Musculoskeletal: Positive for arthralgias and back pain. Negative for joint swelling, myalgias and neck stiffness.  Skin: Negative for color change, rash and wound.  Neurological: Negative for dizziness, tremors, seizures, speech difficulty, weakness, light-headedness and headaches.  Psychiatric/Behavioral: Negative for behavioral problems, confusion, decreased concentration, dysphoric mood and sleep disturbance. The patient is not nervous/anxious.   All other systems reviewed and are negative.   Social History      He  reports that  has never smoked. he has never used smokeless tobacco. He reports that he does not drink alcohol or use drugs.       Social History   Socioeconomic History  . Marital  status: Married    Spouse name: Not on file  . Number of  children: 4  . Years of education: Not on file  . Highest education level: Not on file  Social Needs  . Financial resource strain: Not on file  . Food insecurity - worry: Not on file  . Food insecurity - inability: Not on file  . Transportation needs - medical: Not on file  . Transportation needs - non-medical: Not on file  Occupational History  . Occupation: Disabled    Comment: due to Psoriatic Arthritis  Tobacco Use  . Smoking status: Never Smoker  . Smokeless tobacco: Never Used  Substance and Sexual Activity  . Alcohol use: No    Alcohol/week: 0.0 oz  . Drug use: No  . Sexual activity: Not on file  Other Topics Concern  . Not on file  Social History Narrative  . Not on file    Past Medical History:  Diagnosis Date  . H/O adenomatous polyp of colon 03/24/2015  . History of brain disorder: history of amaurosis fugax 03/24/2015  . History of rheumatic fever 03/24/2015   DID Have Rheumatic Fever.    . Peptic ulcer disease      Patient Active Problem List   Diagnosis Date Noted  . Hypertension 05/18/2016  . Elevated blood pressure reading 04/19/2016  . Hip pain 09/15/2015  . Pain in the chest 03/27/2015  . Arthritis 03/24/2015  . History of GI bleed 03/24/2015  . History of brain disorder: history of amaurosis fugax 03/24/2015  . H/O adenomatous polyp of colon 03/24/2015  . Immunosuppressed status ([place]) 03/24/2015  . Psoriatic arthritis ([place]) 08/10/2011  . History of pneumonia 04/04/2008  . Herpes 03/26/2007  . Dyslipidemia 11/25/2005  . Hypercholesteremia 11/23/2005    Past Surgical History:  Procedure Laterality Date  . APPENDECTOMY    . MANDIBLE FRACTURE SURGERY     fractured jaw  . PARTIAL GASTRECTOMY     peptic ulcer disease    Family History        Family Status  Relation Name Status  . Mother  Deceased  . Father  Deceased  . Brother  Deceased  . Brother  Deceased        His family history includes Cancer in his brother, brother, and mother;  Heart disease (age of onset: 76) in his father.     Allergies  Allergen Reactions  . Pravachol  [Pravastatin]     Fatigue     Current Outpatient Medications:  .  etanercept (ENBREL) 50 MG/ML injection, Inject into the skin once a week., Disp: , Rfl:  .  FOLIC ACID PO, Take by mouth daily., Disp: , Rfl:  .  HYDROcodone-acetaminophen (NORCO) 7.5-325 MG tablet, Take 1 tablet by mouth 3 (three) times daily as needed for moderate pain., Disp: 90 tablet, Rfl: 0 .  methotrexate (RHEUMATREX) 2.5 MG tablet, TAKE 5  TABLETS BY MOUTH EACH WEEK, Disp: , Rfl:  .  misoprostol (CYTOTEC) 100 MCG tablet, Take 1 tablet by mouth daily., Disp: , Rfl:  .  MULTIPLE VITAMIN PO, Take 1 tablet by mouth daily., Disp: , Rfl:  .  OMEGA-3 FATTY ACIDS PO, Take 2 capsules by mouth daily., Disp: , Rfl:  .  predniSONE (DELTASONE) 5 MG tablet, Take 7.5 mg daily with breakfast by mouth., Disp: , Rfl:  .  valsartan (DIOVAN) 80 MG tablet, Take 80 mg daily by mouth., Disp: , Rfl:    Patient Care Team: Birdie Sons, MD as  PCP - General (Family Medicine) Rosaria Ferries, MD as Referring Physician (Rheumatology)      Objective:   Vitals: Most recent update: 12/14/2016 1:33 PM by Fabio Neighbors, LPN  BP  470/96 (BP Location: Left Arm)     Pulse  72     Temp  98.7 F (37.1 C) (Oral)     Ht  6' 1" (1.854 m)     Wt  222 lb 12.8 oz (101.1 kg)      BMI  29.39 kg/m          Physical Exam   General Appearance:    Alert, cooperative, no distress, appears stated age  Head:    Normocephalic, without obvious abnormality, atraumatic  Eyes:    PERRL, conjunctiva/corneas clear, EOM's intact, fundi    benign, both eyes       Ears:    Normal TM's and external ear canals, both ears  Nose:   Nares normal, septum midline, mucosa normal, no drainage   or sinus tenderness  Throat:   Lips, mucosa, and tongue normal; teeth and gums normal  Neck:   Supple, symmetrical, trachea midline, no adenopathy;         thyroid:  No enlargement/tenderness/nodules; no carotid   bruit or JVD  Back:     Symmetric, no curvature, ROM normal, no CVA tenderness  Lungs:     Clear to auscultation bilaterally, respirations unlabored  Chest wall:    No tenderness or deformity  Heart:    Regular rate and rhythm, S1 and S2 normal, no murmur, rub   or gallop  Abdomen:     Soft, non-tender, bowel sounds active all four quadrants,    no masses, no organomegaly  Genitalia:    deferred  Rectal:    deferred  Extremities:   Extremities normal, atraumatic, no cyanosis or edema  Pulses:   2+ and symmetric all extremities  Skin:   Skin color, texture, turgor normal, no rashes or lesions  Lymph nodes:   Cervical, supraclavicular, and axillary nodes normal  Neurologic:   CNII-XII intact. Normal strength, sensation and reflexes      throughout    Depression Screen PHQ 2/9 Scores 12/14/2016 12/14/2016 08/25/2015  PHQ - 2 Score 0 0 0  PHQ- 9 Score 3 - 3      Assessment & Plan:     Routine Health Maintenance and Physical Exam  Exercise Activities and Dietary recommendations Goals    None      Immunization History  Administered Date(s) Administered  . DTaP 01/20/2005  . Influenza-Unspecified 11/15/2012  . Pneumococcal Polysaccharide-23 01/16/2002, 10/31/2011    Health Maintenance  Topic Date Due  . TETANUS/TDAP  01/20/1964  . PNA vac Low Risk Adult (2 of 2 - PCV13) 10/30/2012  . COLONOSCOPY  02/11/2016  . INFLUENZA VACCINE  09/07/2016  . Hepatitis C Screening  Completed     Discussed health benefits of physical activity, and encouraged him to engage in regular exercise appropriate for his age and condition.    --------------------------------------------------------------------  1. Annual physical exam   2. Prostate cancer screening  - PSA  3. H/O adenomatous polyp of colon Counseled that he is due for 3 year follow up colonoscopy. He doesn't want referral done at this time, but will call when  ready for referral.   4. Psoriatic arthritis ([place]) Continue routine follow up rheumatology.  - HYDROcodone-acetaminophen ([place]) 7.5-325 MG tablet; Take 1 tablet 3 (three) times daily as needed by mouth for moderate  pain.  Dispense: 90 tablet; Refill: 0    Lelon Huh, MD  [facility]

## 2016-12-19 DIAGNOSIS — Z79899 Other long term (current) drug therapy: Secondary | ICD-10-CM | POA: Diagnosis not present

## 2016-12-19 DIAGNOSIS — L4050 Arthropathic psoriasis, unspecified: Secondary | ICD-10-CM | POA: Diagnosis not present

## 2016-12-19 DIAGNOSIS — M461 Sacroiliitis, not elsewhere classified: Secondary | ICD-10-CM | POA: Diagnosis not present

## 2017-01-13 ENCOUNTER — Other Ambulatory Visit: Payer: Self-pay | Admitting: Family Medicine

## 2017-01-13 DIAGNOSIS — L4050 Arthropathic psoriasis, unspecified: Secondary | ICD-10-CM

## 2017-01-13 NOTE — Telephone Encounter (Signed)
Pt's wife called wanting an early refill on his hydrocodone 7.5/325  They are asking because of the impending weather.  He use [facility] [address]  Call back is [phone]  thanks [employer]

## 2017-01-14 MED ORDER — HYDROCODONE-ACETAMINOPHEN 7.5-325 MG PO TABS: 1.0000 | ORAL_TABLET | Freq: Three times a day (TID) | ORAL | 0 refills | Status: DC | PRN

## 2017-01-14 NOTE — Telephone Encounter (Signed)
Left patient a message advising him that RX has been sent to pharmacy.  

## 2017-01-14 NOTE — Telephone Encounter (Signed)
Please advise patient that hydrocodone/apap  prescription has been sent electronically to   [place], [place] [address] Phone: [phone] Fax: [phone]

## 2017-01-14 NOTE — Telephone Encounter (Signed)
Last RF 12/14/16 and last OV 12/14/16

## 2017-01-19 ENCOUNTER — Encounter: Payer: Self-pay | Admitting: Family Medicine

## 2017-01-19 ENCOUNTER — Ambulatory Visit: Payer: Medicare Other | Admitting: Family Medicine

## 2017-01-19 VITALS — BP 118/72 | HR 68 | Temp 97.6°F | Resp 16 | Wt 221.0 lb

## 2017-01-19 DIAGNOSIS — D899 Disorder involving the immune mechanism, unspecified: Secondary | ICD-10-CM

## 2017-01-19 DIAGNOSIS — L03116 Cellulitis of left lower limb: Secondary | ICD-10-CM

## 2017-01-19 DIAGNOSIS — D849 Immunodeficiency, unspecified: Secondary | ICD-10-CM

## 2017-01-19 MED ORDER — CEPHALEXIN 500 MG PO CAPS: 500.0000 mg | ORAL_CAPSULE | Freq: Four times a day (QID) | ORAL | 0 refills | Status: DC

## 2017-01-19 NOTE — Patient Instructions (Signed)

## 2017-01-19 NOTE — Progress Notes (Signed)
Patient: Edward Mejia Male    DOB: 1945-11-27   71 y.o.   MRN: [id number] Visit Date: 01/19/2017  Today's Provider: Lavon Paganini, MD   Chief Complaint  Patient presents with  . left leg redness   Subjective:    Leg Pain   Incident onset: Monday  There was no injury mechanism. The pain is present in the left leg. Quality: soreness. The pain is mild. The pain has been worsening since onset. Associated symptoms comments: Redness, swelling, "warm" to the touch, On Monday he reports chills, fever and sweats which subsided . Nothing aggravates the symptoms. He has tried nothing for the symptoms.  Has not noticed any drainage, open sores.  Redness has been extending up L lower leg (Started on L foot) for 4 days.  He has h/o psoriasis and PSA on Enbrel, MTX, and chronic prednisone.  He does have some psoriasis on b/l legs.     Allergies  Allergen Reactions  . Pravachol  [Pravastatin]     Fatigue     Current Outpatient Medications:  .  etanercept (ENBREL) 50 MG/ML injection, Inject into the skin once a week., Disp: , Rfl:  .  FOLIC ACID PO, Take by mouth daily., Disp: , Rfl:  .  HYDROcodone-acetaminophen (NORCO) 7.5-325 MG tablet, Take 1 tablet by mouth 3 (three) times daily as needed for moderate pain., Disp: 90 tablet, Rfl: 0 .  methotrexate (RHEUMATREX) 2.5 MG tablet, TAKE 5  TABLETS BY MOUTH EACH WEEK, Disp: , Rfl:  .  misoprostol (CYTOTEC) 100 MCG tablet, Take 1 tablet by mouth daily., Disp: , Rfl:  .  MULTIPLE VITAMIN PO, Take 1 tablet by mouth daily., Disp: , Rfl:  .  OMEGA-3 FATTY ACIDS PO, Take 2 capsules by mouth daily., Disp: , Rfl:  .  predniSONE (DELTASONE) 5 MG tablet, Take 7.5 mg daily with breakfast by mouth., Disp: , Rfl:  .  valsartan (DIOVAN) 80 MG tablet, Take 80 mg daily by mouth., Disp: , Rfl:   Review of Systems  Constitutional: Positive for chills, fever and unexpected weight change.  Eyes: Negative.   Respiratory: Negative.     Cardiovascular: Positive for leg swelling.  Skin: Positive for color change.    Social History   Tobacco Use  . Smoking status: Never Smoker  . Smokeless tobacco: Never Used  Substance Use Topics  . Alcohol use: No    Alcohol/week: 0.0 oz   Objective:   BP 118/72 (BP Location: Left Arm, Patient Position: Sitting, Cuff Size: Normal)   Pulse 68   Temp 97.6 F (36.4 C) (Oral)   Resp 16   Wt 221 lb (100.2 kg)   SpO2 97%   BMI 29.16 kg/m    Physical Exam  Constitutional: He is oriented to person, place, and time. He appears well-developed and well-nourished. No distress.  HENT:  Head: Normocephalic and atraumatic.  Eyes: Conjunctivae are normal.  Cardiovascular: Normal rate, regular rhythm, normal heart sounds and intact distal pulses.  No murmur heard. Pulmonary/Chest: Effort normal and breath sounds normal. No respiratory distress. He has no wheezes. He has no rales.  Musculoskeletal:  Edema of L ankle. B/l DP and PT pulses intact, though feet are cold.  Neurological: He is alert and oriented to person, place, and time.  Skin:  LLE warm to touch from ankle to knee compared to RLE.  B/l feet cold. LLE red and TTP. Excoriated psoriasis of LLE. No purulence.  Vitals reviewed.  Assessment & Plan:     1. Cellulitis of left lower extremity - PE c/w cellulitis of LLE - likely from break in skin from psoriasis - VVS currently but reported fevers and chills at home. - no purulence to suggest MRSA - treat with 7d course of Keflex - return precautions discussed  2. Immunosuppressed status ([place]) - on biologic, MTX, and chronic prednisone - discussed with patient that this puts him at higher risk of infections - can continue current meds, but if cellulitis is not improving, will need to discuss holding some with Rheum  Meds ordered this encounter  Medications  . cephALEXin (KEFLEX) 500 MG capsule    Sig: Take 1 capsule (500 mg total) by mouth 4 (four) times daily for  7 days.    Dispense:  28 capsule    Refill:  0    Return if symptoms worsen or fail to improve.     The entirety of the information documented in the History of Present Illness, Review of Systems and Physical Exam were personally obtained by me. Portions of this information were initially documented by Idelle Jo, CMA and reviewed by me for thoroughness and accuracy.    Virginia Crews, MD, MPH [facility] 01/19/2017 2:04 PM

## 2017-02-13 ENCOUNTER — Other Ambulatory Visit: Payer: Self-pay | Admitting: Family Medicine

## 2017-02-13 DIAGNOSIS — L4050 Arthropathic psoriasis, unspecified: Secondary | ICD-10-CM

## 2017-02-13 MED ORDER — HYDROCODONE-ACETAMINOPHEN 7.5-325 MG PO TABS: 1.0000 | ORAL_TABLET | Freq: Three times a day (TID) | ORAL | 0 refills | Status: DC | PRN

## 2017-02-13 NOTE — Telephone Encounter (Signed)
Patient needs a refill on  HYDROcodone-acetaminophen (NORCO) 7.5-325 MG tablet   He uses [facility]

## 2017-02-13 NOTE — Telephone Encounter (Signed)
Please review. Thanks!  

## 2017-02-22 ENCOUNTER — Ambulatory Visit: Payer: Medicare Other | Admitting: Family Medicine

## 2017-02-22 ENCOUNTER — Encounter: Payer: Self-pay | Admitting: Family Medicine

## 2017-02-22 VITALS — BP 142/62 | HR 82 | Temp 98.2°F | Resp 16 | Wt 219.0 lb

## 2017-02-22 DIAGNOSIS — L03116 Cellulitis of left lower limb: Secondary | ICD-10-CM | POA: Diagnosis not present

## 2017-02-22 MED ORDER — CEPHALEXIN 500 MG PO CAPS: 500.0000 mg | ORAL_CAPSULE | Freq: Four times a day (QID) | ORAL | 0 refills | Status: AC

## 2017-02-22 NOTE — Progress Notes (Signed)
Patient: Edward Mejia Male    DOB: Nov 12, 1945   72 y.o.   MRN: [id number] Visit Date: 02/22/2017  Today's Provider: Lelon Huh, MD   Chief Complaint  Patient presents with  . Cellulitis   Subjective:    HPI Patient comes in today c/o cellulitis in his lower left leg radiating to his left foot.He was seen in the office by Dr. B on 01/19/2017 and was prescribed Keflex for 7 days. He reports that he tolerated the medication well. He completed cephalexin and states it dramatically improved, but was still a slight bit of pink on his leg when he finished. It has gotten much worse again the last 2-3 days and had some chills this morning.   Allergies  Allergen Reactions  . Pravachol  [Pravastatin]     Fatigue     Current Outpatient Medications:  .  etanercept (ENBREL) 50 MG/ML injection, Inject into the skin once a week., Disp: , Rfl:  .  FOLIC ACID PO, Take by mouth daily., Disp: , Rfl:  .  HYDROcodone-acetaminophen (NORCO) 7.5-325 MG tablet, Take 1 tablet by mouth 3 (three) times daily as needed for moderate pain., Disp: 90 tablet, Rfl: 0 .  methotrexate (RHEUMATREX) 2.5 MG tablet, TAKE 5  TABLETS BY MOUTH EACH WEEK, Disp: , Rfl:  .  misoprostol (CYTOTEC) 100 MCG tablet, Take 1 tablet by mouth daily., Disp: , Rfl:  .  MULTIPLE VITAMIN PO, Take 1 tablet by mouth daily., Disp: , Rfl:  .  OMEGA-3 FATTY ACIDS PO, Take 2 capsules by mouth daily., Disp: , Rfl:  .  predniSONE (DELTASONE) 5 MG tablet, Take 7.5 mg daily with breakfast by mouth., Disp: , Rfl:  .  valsartan (DIOVAN) 80 MG tablet, Take 80 mg daily by mouth., Disp: , Rfl:   Review of Systems  Constitutional: Positive for activity change, chills and fatigue.  Musculoskeletal: Positive for arthralgias, gait problem, joint swelling and myalgias.  Skin: Positive for color change.    Social History   Tobacco Use  . Smoking status: Never Smoker  . Smokeless tobacco: Never Used  Substance Use Topics  . Alcohol  use: No    Alcohol/week: 0.0 oz   Objective:   BP (!) 142/62 (BP Location: Left Arm, Patient Position: Sitting, Cuff Size: Normal)   Pulse 82   Temp 98.2 F (36.8 C)   Resp 16   Wt 219 lb (99.3 kg)   SpO2 97%   BMI 28.89 kg/m  Vitals:   02/22/17 1621  BP: (!) 142/62  Pulse: 82  Resp: 16  Temp: 98.2 F (36.8 C)  SpO2: 97%  Weight: 219 lb (99.3 kg)     Physical Exam  General appearance: alert, well developed, well nourished, cooperative and in no distress Head: Normocephalic, without obvious abnormality, atraumatic Respiratory: Respirations even and unlabored, normal respiratory rate Extremities: No gross deformities Skin: Anterior and medial left lower leg with blanching pink skin, a small psoriatic plaque is noted, but no discharge noted. Mild swelling of foot and ankle noted.      Assessment & Plan:     1. Cellulitis of left lower extremity Dramatically improved with cephalexin prescribed last month, but never completely resolved. Start back on prolonged course of  - cephALEXin (KEFLEX) 500 MG capsule; Take 1 capsule (500 mg total) by mouth 4 (four) times daily for 14 days.  Dispense: 56 capsule; Refill: 0  Call if not improving within 36 hours, or If not 100%  better when finished with cephalexin.        Lelon Huh, MD  [facility]

## 2017-03-08 DIAGNOSIS — Z79899 Other long term (current) drug therapy: Secondary | ICD-10-CM | POA: Diagnosis not present

## 2017-03-08 DIAGNOSIS — L409 Psoriasis, unspecified: Secondary | ICD-10-CM | POA: Diagnosis not present

## 2017-03-08 DIAGNOSIS — M5136 Other intervertebral disc degeneration, lumbar region: Secondary | ICD-10-CM | POA: Diagnosis not present

## 2017-03-08 DIAGNOSIS — L4050 Arthropathic psoriasis, unspecified: Secondary | ICD-10-CM | POA: Diagnosis not present

## 2017-03-10 ENCOUNTER — Telehealth: Payer: Self-pay | Admitting: Family Medicine

## 2017-03-10 NOTE — Telephone Encounter (Signed)
Please advise 

## 2017-03-10 NOTE — Telephone Encounter (Signed)
Ray's rheumatologist, Dr. Esperanza Sheets, has retired and he now sees Dr. Ephriam Jenkins in the [place] office.  Dr. Posey Pronto did a lot of blood work on [employer] yesterday but wanted his PCP to order a DEXA scan.   Dr. Ena Dawley phone # is [phone]  If you have any questions.

## 2017-03-11 ENCOUNTER — Other Ambulatory Visit: Payer: Self-pay | Admitting: Family Medicine

## 2017-03-11 DIAGNOSIS — Z7952 Long term (current) use of systemic steroids: Secondary | ICD-10-CM

## 2017-03-13 ENCOUNTER — Other Ambulatory Visit: Payer: Self-pay | Admitting: Family Medicine

## 2017-03-13 DIAGNOSIS — L4050 Arthropathic psoriasis, unspecified: Secondary | ICD-10-CM

## 2017-03-13 MED ORDER — HYDROCODONE-ACETAMINOPHEN 7.5-325 MG PO TABS: 1.0000 | ORAL_TABLET | Freq: Three times a day (TID) | ORAL | 0 refills | Status: DC | PRN

## 2017-03-13 NOTE — Telephone Encounter (Signed)
Pt's wife [name] contacted office for refill request on the following medications:  HYDROcodone-acetaminophen ([place]) 7.5-325 MG tablet   [employer].  Last Rx: 02/13/17 LOV: 02/22/17  Please advise. Thanks TNP

## 2017-03-20 ENCOUNTER — Telehealth: Payer: Self-pay | Admitting: Family Medicine

## 2017-03-20 NOTE — Telephone Encounter (Signed)
Please advise patient that we received labs from his rheumatologist bout elevated potassium levels. He needs to avoid citrus fruits and fruit juices such as bananas, orange juice, also avocados and sweet potatoes. Needs to recheck potassium levels in 3 weeks. If still elevated we may need to change his BP medication.

## 2017-03-20 NOTE — Telephone Encounter (Signed)
Patient's wife was advised.  

## 2017-03-20 NOTE — Telephone Encounter (Signed)
Please advise 

## 2017-03-21 ENCOUNTER — Encounter: Payer: Self-pay | Admitting: Family Medicine

## 2017-03-21 ENCOUNTER — Ambulatory Visit: Payer: Medicare Other | Admitting: Family Medicine

## 2017-03-21 VITALS — BP 124/60 | HR 63 | Temp 98.5°F | Resp 18 | Wt 223.0 lb

## 2017-03-21 DIAGNOSIS — L03119 Cellulitis of unspecified part of limb: Secondary | ICD-10-CM

## 2017-03-21 MED ORDER — CEPHALEXIN 500 MG PO CAPS: 500.0000 mg | ORAL_CAPSULE | Freq: Four times a day (QID) | ORAL | 0 refills | Status: AC

## 2017-03-21 NOTE — Progress Notes (Signed)
Patient: Edward Mejia Male    DOB: Nov 16, 1945   72 y.o.   MRN: [id number] Visit Date: 03/21/2017  Today's Provider: Lelon Huh, MD   Chief Complaint  Patient presents with  . Leg Swelling   Subjective:    HPI Leg Swelling: Patient presents reporting that the redness and swelling of his left leg returned 2-3 days ago. Patient was last seen in the office in mid December for cellulitis of the left lower extremity and treated with cephalexin. He had follow up on 1/16 and had improved, but prescribed an additional 14 days since it had not completely resolved.  He states it had mostly resolved, but over the last 2 days his left leg has started to get red, painful and swollen again, although not nearly as bad as it was in December. No chills, fevers, or sweats.     Allergies  Allergen Reactions  . Pravachol  [Pravastatin]     Fatigue     Current Outpatient Medications:  .  etanercept (ENBREL) 50 MG/ML injection, Inject into the skin once a week., Disp: , Rfl:  .  FOLIC ACID PO, Take by mouth daily., Disp: , Rfl:  .  HYDROcodone-acetaminophen (NORCO) 7.5-325 MG tablet, Take 1 tablet by mouth 3 (three) times daily as needed for moderate pain., Disp: 90 tablet, Rfl: 0 .  methotrexate (RHEUMATREX) 2.5 MG tablet, TAKE 5  TABLETS BY MOUTH EACH WEEK, Disp: , Rfl:  .  misoprostol (CYTOTEC) 100 MCG tablet, Take 1 tablet by mouth daily., Disp: , Rfl:  .  MULTIPLE VITAMIN PO, Take 1 tablet by mouth daily., Disp: , Rfl:  .  OMEGA-3 FATTY ACIDS PO, Take 2 capsules by mouth daily., Disp: , Rfl:  .  predniSONE (DELTASONE) 5 MG tablet, Take 5 mg by mouth daily with breakfast. , Disp: , Rfl:  .  valsartan (DIOVAN) 80 MG tablet, Take 80 mg daily by mouth., Disp: , Rfl:   Review of Systems  Constitutional: Negative for appetite change, chills and fever.  Respiratory: Negative for chest tightness, shortness of breath and wheezing.   Cardiovascular: Positive for leg swelling. Negative for  chest pain and palpitations.  Gastrointestinal: Negative for abdominal pain, nausea and vomiting.  Skin: Positive for color change.    Social History   Tobacco Use  . Smoking status: Never Smoker  . Smokeless tobacco: Never Used  Substance Use Topics  . Alcohol use: No    Alcohol/week: 0.0 oz   Objective:   BP 124/60 (BP Location: Right Arm, Patient Position: Sitting, Cuff Size: Large)   Pulse 63   Temp 98.5 F (36.9 C) (Oral)   Resp 18   Wt 223 lb (101.2 kg)   SpO2 97% Comment: room air  BMI 29.42 kg/m  There were no vitals filed for this visit.   Physical Exam   General Appearance:    Alert, cooperative, no distress  Eyes:    PERRL, conjunctiva/corneas clear, EOM's intact       Lungs:     Clear to auscultation bilaterally, respirations unlabored  Heart:    Regular rate and rhythm  Neurologic:   Awake, alert, oriented x 3. No apparent focal neurological           defect.   Ext:  dull red and tender left foot and lower leg, several erosions between toes of left food. Weak thready pedal pulses. Cap refill 2 second left, 5 seconds right. No red streaks.  Assessment & Plan:     1. Recurrent cellulitis of lower extremity  - cephALEXin (KEFLEX) 500 MG capsule; Take 1 capsule (500 mg total) by mouth 4 (four) times daily for 14 days.  Dispense: 56 capsule; Refill: 0   Exam suggestive of PAD, need to schedule ABIs.       Lelon Huh, MD  [facility]

## 2017-03-22 ENCOUNTER — Ambulatory Visit (INDEPENDENT_AMBULATORY_CARE_PROVIDER_SITE_OTHER): Payer: Medicare Other

## 2017-03-22 DIAGNOSIS — L03119 Cellulitis of unspecified part of limb: Secondary | ICD-10-CM | POA: Diagnosis not present

## 2017-03-22 LAB — POCT ANKLE BRACHIAL INDEX ASSESSMENT (BFP)
IMABIL: 0.95
IMABIR: 1.14

## 2017-03-22 NOTE — Progress Notes (Signed)
Patient came in for ABI today. He reports that he is doing well, no other complaints. He still has swelling and redness in his lower left leg and foot. He is tolerating the antibiotics very well.

## 2017-03-24 ENCOUNTER — Other Ambulatory Visit: Payer: Self-pay | Admitting: Family Medicine

## 2017-03-24 DIAGNOSIS — L03119 Cellulitis of unspecified part of limb: Secondary | ICD-10-CM

## 2017-03-24 NOTE — Progress Notes (Signed)
re

## 2017-04-03 ENCOUNTER — Telehealth: Payer: Self-pay | Admitting: Family Medicine

## 2017-04-03 NOTE — Telephone Encounter (Signed)
Please review. Thanks!  

## 2017-04-03 NOTE — Telephone Encounter (Signed)
Ray usually sees Dr. Esperanza Sheets for rheumatology but he has retired.    He is hurting really bad in his shoulders, hips and lower back and was wondering if you could give him cortisone shots there or if [facility] can.    He cannot see another rheumatologist for 8 days. Please advise ASAP

## 2017-04-04 ENCOUNTER — Ambulatory Visit (INDEPENDENT_AMBULATORY_CARE_PROVIDER_SITE_OTHER): Payer: Medicare Other | Admitting: Vascular Surgery

## 2017-04-04 ENCOUNTER — Encounter (INDEPENDENT_AMBULATORY_CARE_PROVIDER_SITE_OTHER): Payer: Self-pay | Admitting: Vascular Surgery

## 2017-04-04 VITALS — BP 131/74 | HR 59 | Resp 16 | Ht 73.0 in | Wt 220.0 lb

## 2017-04-04 DIAGNOSIS — M79604 Pain in right leg: Secondary | ICD-10-CM | POA: Diagnosis not present

## 2017-04-04 DIAGNOSIS — L03119 Cellulitis of unspecified part of limb: Secondary | ICD-10-CM

## 2017-04-04 DIAGNOSIS — M79605 Pain in left leg: Secondary | ICD-10-CM | POA: Diagnosis not present

## 2017-04-04 DIAGNOSIS — R600 Localized edema: Secondary | ICD-10-CM

## 2017-04-04 NOTE — Progress Notes (Signed)
Subjective:    Patient ID: Edward Mejia, male    DOB: 09/11/1945, 72 y.o.   MRN: [id number] Chief Complaint  Patient presents with  . New Patient (Initial Visit)    discuss recurrent cellulitis   Presents as a new patient referred by Dr. Caryn Section for recurrent cellulitis to the left lower extremity.  The patient has had cellulitis twice in the last month.  The patient endorses long-standing history of bilateral lower extremity edema.  He notes the edema is worse towards the end of the day or with sitting and standing for long periods of time.  The patient also notes pain to the bilateral feet.  The patient wife says that he has arthritis and she was told by Dr. Caryn Section that due to his arthritis he might have a "blockage" and needs to be checked for it.  The patient does experience bilateral foot pain with ambulation.  At this time, the patient does not engage in conservative therapy including wearing medical grade 1 compression stockings and elevating his legs heart level or higher.  The patient notes his edema and discomfort has progressed to the point that he is unable to function on a daily basis.  The patient states that his symptoms are lifestyle limiting.  The patient denies any claudication-like symptoms, rest pain or ulceration to the bilateral lower extremity.  The patient denies any fever, nausea vomiting.   Review of Systems  Constitutional: Negative.   HENT: Negative.   Eyes: Negative.   Respiratory: Negative.   Cardiovascular: Positive for leg swelling.  Gastrointestinal: Negative.   Endocrine: Negative.   Genitourinary: Negative.   Musculoskeletal: Negative.   Skin: Negative.   Allergic/Immunologic: Negative.   Neurological: Negative.   Hematological: Negative.   Psychiatric/Behavioral: Negative.       Objective:   Physical Exam  Constitutional: He is oriented to person, place, and time. He appears well-developed and well-nourished. No distress.  HENT:  Head:  Normocephalic and atraumatic.  Eyes: Conjunctivae are normal. Pupils are equal, round, and reactive to light.  Neck: Normal range of motion.  Cardiovascular: Normal rate, regular rhythm, normal heart sounds and intact distal pulses.  Pulses:      Radial pulses are 2+ on the right side, and 2+ on the left side.       Dorsalis pedis pulses are 2+ on the right side, and 2+ on the left side.       Posterior tibial pulses are 2+ on the right side, and 2+ on the left side.  Pulmonary/Chest: Effort normal and breath sounds normal.  Musculoskeletal: Normal range of motion. He exhibits edema (Mild to moderate 1+ pitting edema noted bilaterally).  Neurological: He is alert and oriented to person, place, and time.  Skin: Skin is warm and dry. He is not diaphoretic.  Minimal less than 1 cm varicosities noted to the bilateral lower extremity.  There is mild stasis dermatitis.  There are no skin changes.  There is no active cellulitis to the bilateral lower extremity  Psychiatric: He has a normal mood and affect. His behavior is normal. Judgment and thought content normal.  Vitals reviewed.  BP 131/74   Pulse (!) 59   Resp 16   Ht 6\' 1"  (1.854 m)   Wt 220 lb (99.8 kg)   BMI 29.03 kg/m   Past Medical History:  Diagnosis Date  . Arthritis   . Cellulitis   . H/O adenomatous polyp of colon 03/24/2015  . History of brain disorder:  history of amaurosis fugax 03/24/2015  . History of rheumatic fever 03/24/2015   DID Have Rheumatic Fever.    . Peptic ulcer disease    Social History   Socioeconomic History  . Marital status: Married    Spouse name: Not on file  . Number of children: 4  . Years of education: Not on file  . Highest education level: Not on file  Social Needs  . Financial resource strain: Not on file  . Food insecurity - worry: Not on file  . Food insecurity - inability: Not on file  . Transportation needs - medical: Not on file  . Transportation needs - non-medical: Not on file    Occupational History  . Occupation: Disabled    Comment: due to Psoriatic Arthritis  Tobacco Use  . Smoking status: Never Smoker  . Smokeless tobacco: Never Used  Substance and Sexual Activity  . Alcohol use: No    Alcohol/week: 0.0 oz  . Drug use: No  . Sexual activity: Not on file  Other Topics Concern  . Not on file  Social History Narrative  . Not on file   Past Surgical History:  Procedure Laterality Date  . APPENDECTOMY    . MANDIBLE FRACTURE SURGERY     fractured jaw  . PARTIAL GASTRECTOMY     peptic ulcer disease   Family History  Problem Relation Age of Onset  . Cancer Mother        lung cancer  . Heart disease Father 24       heart attack  . Cancer Brother        pancreatic cancer  . Cancer Brother        pancreatic cancer   Allergies  Allergen Reactions  . Pravachol  [Pravastatin]     Fatigue      Assessment & Plan:  Presents as a new patient referred by Dr. Caryn Section for recurrent cellulitis to the left lower extremity.  The patient has had cellulitis twice in the last month.  The patient endorses long-standing history of bilateral lower extremity edema.  He notes the edema is worse towards the end of the day or with sitting and standing for long periods of time.  The patient also notes pain to the bilateral feet.  The patient wife says that he has arthritis and she was told by Dr. Caryn Section that due to his arthritis he might have a "blockage" and needs to be checked for it.  The patient does experience bilateral foot pain with ambulation.  At this time, the patient does not engage in conservative therapy including wearing medical grade 1 compression stockings and elevating his legs heart level or higher.  The patient notes his edema and discomfort has progressed to the point that he is unable to function on a daily basis.  The patient states that his symptoms are lifestyle limiting.  The patient denies any claudication-like symptoms, rest pain or ulceration to the  bilateral lower extremity.  The patient denies any fever, nausea vomiting.  1. Recurrent cellulitis of lower extremity - Stable This is most likely due to the patient's unmanaged bilateral lower extremity edema Patient will start to engage in conservative therapy and I will bring him back to undergo an ABI and venous duplex  - VAS [place] ABI WITH/WO TBI; Future  2. Bilateral lower extremity edema - New The patient was encouraged to wear graduated compression stockings (20-30 mmHg) on a daily basis. The patient was instructed to begin wearing the  stockings first thing in the morning and removing them in the evening. The patient was instructed specifically not to sleep in the stockings. Prescription In addition, behavioral modification including elevation during the day will be initiated. Anti-inflammatories for pain. Information on chronic venous insufficiency and compression stockings was given to the patient. The patient was instructed to call the office in the interim if any worsening edema or ulcerations to the legs, feet or toes occurs. The patient expresses their understanding.  - VAS [place] ABI WITH/WO TBI; Future - VAS [place] LOWER EXTREMITY VENOUS REFLUX; Future  3. Bilateral lower extremity pain - New As above  - VAS [place] ABI WITH/WO TBI; Future  Current Outpatient Medications on File Prior to Visit  Medication Sig Dispense Refill  . cyclobenzaprine (FLEXERIL) 5 MG tablet Take 5 mg by mouth 1 day or 1 dose.  1  . etanercept (ENBREL) 50 MG/ML injection Inject into the skin once a week.    Marland Kitchen FOLIC ACID PO Take by mouth daily.    Marland Kitchen HYDROcodone-acetaminophen (NORCO) 7.5-325 MG tablet Take 1 tablet by mouth 3 (three) times daily as needed for moderate pain. 90 tablet 0  . methotrexate (RHEUMATREX) 2.5 MG tablet TAKE 5  TABLETS BY MOUTH EACH WEEK    . misoprostol (CYTOTEC) 100 MCG tablet Take 1 tablet by mouth daily.    . predniSONE (DELTASONE) 5 MG tablet Take 5 mg by mouth daily with  breakfast.     . valsartan (DIOVAN) 80 MG tablet Take 80 mg daily by mouth.    . cephALEXin (KEFLEX) 500 MG capsule Take 1 capsule (500 mg total) by mouth 4 (four) times daily for 14 days. (Patient not taking: Reported on 04/04/2017) 56 capsule 0  . MULTIPLE VITAMIN PO Take 1 tablet by mouth daily.    . OMEGA-3 FATTY ACIDS PO Take 2 capsules by mouth daily.     No current facility-administered medications on file prior to visit.    There are no Patient Instructions on file for this visit. No Follow-up on file.  Marx Doig A Tytionna Cloyd, PA-C

## 2017-04-04 NOTE — Telephone Encounter (Signed)
No, he needs to see his rheumatologist for this.

## 2017-04-06 ENCOUNTER — Other Ambulatory Visit: Payer: Self-pay | Admitting: *Deleted

## 2017-04-06 DIAGNOSIS — L4050 Arthropathic psoriasis, unspecified: Secondary | ICD-10-CM

## 2017-04-06 MED ORDER — HYDROCODONE-ACETAMINOPHEN 7.5-325 MG PO TABS: 1.0000 | ORAL_TABLET | Freq: Three times a day (TID) | ORAL | 0 refills | Status: DC | PRN

## 2017-04-06 NOTE — Telephone Encounter (Signed)
Patient's wife Edward Mejia was advised. Patient has an appt with Dr. Reather Laurence next Tuesday. Edward Mejia stated they may switch to another rheumatologist . Is there another rheumatologist closer to [facility]?

## 2017-04-10 ENCOUNTER — Telehealth: Payer: Self-pay | Admitting: Family Medicine

## 2017-04-10 DIAGNOSIS — E875 Hyperkalemia: Secondary | ICD-10-CM

## 2017-04-10 NOTE — Telephone Encounter (Signed)
Potassium was 5.4 at Rheumatologist in February. Here to recheck potassium level today.

## 2017-04-11 LAB — RENAL FUNCTION PANEL
Albumin: 4.1 g/dL (ref 3.5–4.8)
BUN / CREAT RATIO: 14 (ref 10–24)
BUN: 15 mg/dL (ref 8–27)
CALCIUM: 8.8 mg/dL (ref 8.6–10.2)
CO2: 24 mmol/L (ref 20–29)
CREATININE: 1.06 mg/dL (ref 0.76–1.27)
Chloride: 95 mmol/L — ABNORMAL LOW (ref 96–106)
GFR, EST AFRICAN AMERICAN: 81 mL/min/{1.73_m2} (ref >59)
GFR, EST NON AFRICAN AMERICAN: 70 mL/min/{1.73_m2} (ref >59)
Glucose: 96 mg/dL (ref 65–99)
Phosphorus: 3.1 mg/dL (ref 2.5–4.5)
Potassium: 5.2 mmol/L (ref 3.5–5.2)
Sodium: 136 mmol/L (ref 134–144)

## 2017-04-12 ENCOUNTER — Ambulatory Visit
Admission: RE | Admit: 2017-04-12 | Discharge: 2017-04-12 | Disposition: A | Discharge status: Home / Self Care | Payer: Medicare Other | Source: Ambulatory Visit | Attending: Family Medicine | Admitting: Family Medicine

## 2017-04-12 DIAGNOSIS — Z7952 Long term (current) use of systemic steroids: Secondary | ICD-10-CM | POA: Diagnosis present

## 2017-04-12 DIAGNOSIS — M85852 Other specified disorders of bone density and structure, left thigh: Secondary | ICD-10-CM | POA: Insufficient documentation

## 2017-04-21 ENCOUNTER — Ambulatory Visit
Admission: RE | Admit: 2017-04-21 | Discharge: 2017-04-21 | Disposition: A | Discharge status: Home / Self Care | Payer: Medicare Other | Source: Ambulatory Visit | Attending: Family Medicine | Admitting: Family Medicine

## 2017-04-21 ENCOUNTER — Ambulatory Visit: Payer: Medicare Other | Admitting: Family Medicine

## 2017-04-21 ENCOUNTER — Encounter: Payer: Self-pay | Admitting: Family Medicine

## 2017-04-21 VITALS — BP 126/68 | HR 74 | Temp 98.9°F | Resp 16 | Wt 217.0 lb

## 2017-04-21 DIAGNOSIS — D84821 Immunodeficiency due to drugs: Secondary | ICD-10-CM

## 2017-04-21 DIAGNOSIS — R05 Cough: Secondary | ICD-10-CM

## 2017-04-21 DIAGNOSIS — Z79899 Other long term (current) drug therapy: Secondary | ICD-10-CM

## 2017-04-21 DIAGNOSIS — R059 Cough, unspecified: Secondary | ICD-10-CM

## 2017-04-21 NOTE — Patient Instructions (Signed)
Cough, Adult  Coughing is a reflex that clears your throat and your airways. Coughing helps to heal and protect your lungs. It is normal to cough occasionally, but a cough that happens with other symptoms or lasts a long time may be a sign of a condition that needs treatment. A cough may last only 2-3 weeks (acute), or it may last longer than 8 weeks (chronic).  What are the causes?  Coughing is commonly caused by:   Breathing in substances that irritate your lungs.   A viral or bacterial respiratory infection.   Allergies.   Asthma.   Postnasal drip.   Smoking.   Acid backing up from the stomach into the esophagus (gastroesophageal reflux).   Certain medicines.   Chronic lung problems, including COPD (or rarely, lung cancer).   Other medical conditions such as heart failure.    Follow these instructions at home:  Pay attention to any changes in your symptoms. Take these actions to help with your discomfort:   Take medicines only as told by your health care provider.  ? If you were prescribed an antibiotic medicine, take it as told by your health care provider. Do not stop taking the antibiotic even if you start to feel better.  ? Talk with your health care provider before you take a cough suppressant medicine.   Drink enough fluid to keep your urine clear or pale yellow.   If the air is dry, use a cold steam vaporizer or humidifier in your bedroom or your home to help loosen secretions.   Avoid anything that causes you to cough at work or at home.   If your cough is worse at night, try sleeping in a semi-upright position.   Avoid cigarette smoke. If you smoke, quit smoking. If you need help quitting, ask your health care provider.   Avoid caffeine.   Avoid alcohol.   Rest as needed.    Contact a health care provider if:   You have new symptoms.   You cough up pus.   Your cough does not get better after 2-3 weeks, or your cough gets worse.   You cannot control your cough with suppressant  medicines and you are losing sleep.   You develop pain that is getting worse or pain that is not controlled with pain medicines.   You have a fever.   You have unexplained weight loss.   You have night sweats.  Get help right away if:   You cough up blood.   You have difficulty breathing.   Your heartbeat is very fast.  This information is not intended to replace advice given to you by your health care provider. Make sure you discuss any questions you have with your health care provider.  Document Released: 07/23/2010 Document Revised: 07/02/2015 Document Reviewed: 04/02/2014  Elsevier Interactive Patient Education  2018 [employer]

## 2017-04-21 NOTE — Progress Notes (Signed)
Patient: Edward Mejia Male    DOB: 11-08-1945   72 y.o.   MRN: [id number] Visit Date: 04/21/2017  Today's Provider: Lavon Paganini, MD   Chief Complaint  Patient presents with  . URI    Started over a week ago.   Subjective:    URI   This is a new problem. The current episode started 1 to 4 weeks ago. The problem has been gradually worsening. There has been no fever. Associated symptoms include congestion, coughing, headaches, rhinorrhea and wheezing. Pertinent negatives include no abdominal pain, chest pain, diarrhea, ear pain, joint pain, joint swelling, plugged ear sensation, sinus pain, sneezing, sore throat or swollen glands. The treatment provided no relief.   They were concerned about possible pneumonia given his immunosuppression on Enbrel, MTX, and chronic prednisone.    Allergies  Allergen Reactions  . Pravachol  [Pravastatin]     Fatigue     Current Outpatient Medications:  .  cyclobenzaprine (FLEXERIL) 5 MG tablet, Take 5 mg by mouth 1 day or 1 dose., Disp: , Rfl: 1 .  etanercept (ENBREL) 50 MG/ML injection, Inject into the skin once a week., Disp: , Rfl:  .  FOLIC ACID PO, Take by mouth daily., Disp: , Rfl:  .  HYDROcodone-acetaminophen (NORCO) 7.5-325 MG tablet, Take 1 tablet by mouth 3 (three) times daily as needed for moderate pain., Disp: 90 tablet, Rfl: 0 .  methotrexate (RHEUMATREX) 2.5 MG tablet, TAKE 5  TABLETS BY MOUTH EACH WEEK, Disp: , Rfl:  .  misoprostol (CYTOTEC) 100 MCG tablet, Take 1 tablet by mouth daily., Disp: , Rfl:  .  MULTIPLE VITAMIN PO, Take 1 tablet by mouth daily., Disp: , Rfl:  .  OMEGA-3 FATTY ACIDS PO, Take 2 capsules by mouth daily., Disp: , Rfl:  .  predniSONE (DELTASONE) 5 MG tablet, Take 5 mg by mouth daily with breakfast. , Disp: , Rfl:  .  valsartan (DIOVAN) 80 MG tablet, Take 80 mg daily by mouth., Disp: , Rfl:   Review of Systems  Constitutional: Positive for fatigue. Negative for activity change, appetite change,  chills, diaphoresis, fever and unexpected weight change.  HENT: Positive for congestion, postnasal drip and rhinorrhea. Negative for ear discharge, ear pain, nosebleeds, sinus pressure, sinus pain, sneezing, sore throat, tinnitus and trouble swallowing.   Eyes: Negative.   Respiratory: Positive for cough, chest tightness, shortness of breath and wheezing. Negative for apnea, choking and stridor.   Cardiovascular: Negative for chest pain.  Gastrointestinal: Negative.  Negative for abdominal pain and diarrhea.  Musculoskeletal: Negative.  Negative for joint pain.  Neurological: Positive for headaches. Negative for dizziness and light-headedness.    Social History   Tobacco Use  . Smoking status: Never Smoker  . Smokeless tobacco: Never Used  Substance Use Topics  . Alcohol use: No    Alcohol/week: 0.0 oz   Objective:   BP 126/68 (BP Location: Right Arm, Patient Position: Sitting, Cuff Size: Normal)   Pulse 74   Temp 98.9 F (37.2 C) (Oral)   Resp 16   Wt 217 lb (98.4 kg)   BMI 28.63 kg/m  Vitals:   04/21/17 1442  BP: 126/68  Pulse: 74  Resp: 16  Temp: 98.9 F (37.2 C)  TempSrc: Oral  Weight: 217 lb (98.4 kg)     Physical Exam  Constitutional: He is oriented to person, place, and time. He appears well-developed and well-nourished. No distress.  HENT:  Head: Normocephalic and atraumatic.  Right Ear:  Tympanic membrane, external ear and ear canal normal.  Left Ear: Tympanic membrane, external ear and ear canal normal.  Nose: Rhinorrhea present. Right sinus exhibits no maxillary sinus tenderness and no frontal sinus tenderness. Left sinus exhibits no maxillary sinus tenderness and no frontal sinus tenderness.  Mouth/Throat: Uvula is midline, oropharynx is clear and moist and mucous membranes are normal. No oropharyngeal exudate or posterior oropharyngeal erythema.  Eyes: Conjunctivae and EOM are normal. Pupils are equal, round, and reactive to light. Right eye exhibits no  discharge. Left eye exhibits no discharge. No scleral icterus.  Neck: Neck supple.  Cardiovascular: Normal rate, regular rhythm, normal heart sounds and intact distal pulses.  No murmur heard. Pulmonary/Chest: Effort normal. No respiratory distress. He has no wheezes. He has rales (in b/l bases).  Musculoskeletal: He exhibits no edema.  Lymphadenopathy:    He has no cervical adenopathy.  Neurological: He is alert and oriented to person, place, and time.  Skin: Skin is warm and dry.  Psychiatric: He has a normal mood and affect. His behavior is normal.  Vitals reviewed.       Assessment & Plan:     1. Cough - most likely 2/2 viral URI with post-viral cough - however, due to immunosuppression and abnormal lung sounds in bilateral bases -Suspect abnormal lung findings are related to atelectasis, however -Pending chest x-ray findings, consider antibiotic therapy if indicated -Discussed natural course, symptomatic management, return precautions - DG Chest 2 View; Future  2. Immunosuppression due to drug therapy - see plan above - DG Chest 2 View; Future   Return if symptoms worsen or fail to improve.   The entirety of the information documented in the History of Present Illness, Review of Systems and Physical Exam were personally obtained by me. Portions of this information were initially documented by Ashley Royalty, CMA and reviewed by me for thoroughness and accuracy.    Virginia Crews, MD, MPH [facility] 04/21/2017 3:54 PM

## 2017-04-24 ENCOUNTER — Telehealth: Payer: Self-pay

## 2017-04-24 NOTE — Telephone Encounter (Signed)
Pt's wife [name] advised; OK per DPR. States he was still struggling with his cough this weekend. Advised wife to call back if he is not improving by the end of the week.

## 2017-04-24 NOTE — Telephone Encounter (Signed)
-----   Message from Virginia Crews, MD sent at 04/24/2017  8:49 AM EDT ----- Normal CXR. No pneumonia.  Virginia Crews, MD, MPH [facility] 04/24/2017 8:49 AM

## 2017-05-01 ENCOUNTER — Encounter: Payer: Self-pay | Admitting: Family Medicine

## 2017-05-01 ENCOUNTER — Ambulatory Visit: Payer: Medicare Other | Admitting: Family Medicine

## 2017-05-01 VITALS — BP 110/58 | HR 76 | Temp 98.7°F | Resp 16 | Wt 216.0 lb

## 2017-05-01 DIAGNOSIS — L4050 Arthropathic psoriasis, unspecified: Secondary | ICD-10-CM

## 2017-05-01 DIAGNOSIS — J40 Bronchitis, not specified as acute or chronic: Secondary | ICD-10-CM

## 2017-05-01 DIAGNOSIS — R05 Cough: Secondary | ICD-10-CM

## 2017-05-01 DIAGNOSIS — R059 Cough, unspecified: Secondary | ICD-10-CM

## 2017-05-01 MED ORDER — DOXYCYCLINE HYCLATE 100 MG PO TABS: 100.0000 mg | ORAL_TABLET | Freq: Two times a day (BID) | ORAL | 0 refills | Status: DC

## 2017-05-01 MED ORDER — ALBUTEROL SULFATE HFA 108 (90 BASE) MCG/ACT IN AERS: 2.0000 | INHALATION_SPRAY | Freq: Four times a day (QID) | RESPIRATORY_TRACT | 0 refills | Status: DC | PRN

## 2017-05-01 MED ORDER — HYDROCODONE-ACETAMINOPHEN 7.5-325 MG PO TABS: 1.0000 | ORAL_TABLET | Freq: Three times a day (TID) | ORAL | 0 refills | Status: DC | PRN

## 2017-05-01 NOTE — Progress Notes (Signed)
Patient: Edward Mejia Male    DOB: 07/22/1945   72 y.o.   MRN: [id number] Visit Date: 05/01/2017  Today's Provider: Lelon Huh, MD   Chief Complaint  Patient presents with  . Cough   Subjective:    Cough From 04/21/2017-seen by Dr. Brita Romp. Chest x-ray ordered showing normal. Patient advised to wait to see if symptoms would clear up on their on. Patient states cough has worsened. Cough is worse when he lays down. Patient has not been resting at night. Patient states he has developed some sinus pressure and pain.  Cough has been present for nearly a month, but has become productive and getting worse over the last week. Having to sit up at night to sleep due to cough worsening when he lies down.   Allergies  Allergen Reactions  . Pravachol  [Pravastatin]     Fatigue     Current Outpatient Medications:  .  cyclobenzaprine (FLEXERIL) 5 MG tablet, Take 5 mg by mouth 1 day or 1 dose., Disp: , Rfl: 1 .  etanercept (ENBREL) 50 MG/ML injection, Inject into the skin once a week., Disp: , Rfl:  .  FOLIC ACID PO, Take by mouth daily., Disp: , Rfl:  .  HYDROcodone-acetaminophen (NORCO) 7.5-325 MG tablet, Take 1 tablet by mouth 3 (three) times daily as needed for moderate pain., Disp: 90 tablet, Rfl: 0 .  methotrexate (RHEUMATREX) 2.5 MG tablet, TAKE 5  TABLETS BY MOUTH EACH WEEK, Disp: , Rfl:  .  misoprostol (CYTOTEC) 100 MCG tablet, Take 1 tablet by mouth daily., Disp: , Rfl:  .  MULTIPLE VITAMIN PO, Take 1 tablet by mouth daily., Disp: , Rfl:  .  OMEGA-3 FATTY ACIDS PO, Take 2 capsules by mouth daily., Disp: , Rfl:  .  predniSONE (DELTASONE) 5 MG tablet, Take 7.5 mg by mouth daily with breakfast. , Disp: , Rfl:  .  valsartan (DIOVAN) 80 MG tablet, Take 80 mg daily by mouth., Disp: , Rfl:   Review of Systems  Constitutional: Negative for appetite change, chills and fever.  HENT: Positive for congestion, sinus pressure and sinus pain.   Respiratory: Positive for cough.  Negative for chest tightness, shortness of breath and wheezing.   Cardiovascular: Negative for chest pain and palpitations.  Gastrointestinal: Negative for abdominal pain, nausea and vomiting.    Social History   Tobacco Use  . Smoking status: Never Smoker  . Smokeless tobacco: Never Used  Substance Use Topics  . Alcohol use: No    Alcohol/week: 0.0 oz   Objective:   BP (!) 110/58 (BP Location: Right Arm, Patient Position: Sitting, Cuff Size: Large)   Pulse 76   Temp 98.7 F (37.1 C) (Oral)   Resp 16   Wt 216 lb (98 kg)   SpO2 94%   BMI 28.50 kg/m  Vitals:   05/01/17 1610  BP: (!) 110/58  Pulse: 76  Resp: 16  Temp: 98.7 F (37.1 C)  TempSrc: Oral  SpO2: 94%  Weight: 216 lb (98 kg)     Physical Exam  General Appearance:    Alert, cooperative, no distress  HENT:   neck without nodes, throat normal without erythema or exudate, frontal sinus tender and nasal mucosa pale and congested  Eyes:    PERRL, conjunctiva/corneas clear, EOM's intact       Lungs:     Rare expiratory wheezes, respirations unlabored  Heart:    Regular rate and rhythm  Neurologic:   Awake,  alert, oriented x 3. No apparent focal neurological           defect.           Assessment & Plan:     1. Bronchitis  - albuterol (PROVENTIL HFA;VENTOLIN HFA) 108 (90 Base) MCG/ACT inhaler; Inhale 2 puffs into the lungs every 6 (six) hours as needed for wheezing.  Dispense: 1 Inhaler; Refill: 0 - doxycycline (VIBRA-TABS) 100 MG tablet; Take 1 tablet (100 mg total) by mouth 2 (two) times daily.  Dispense: 20 tablet; Refill: 0  2. Cough  - albuterol (PROVENTIL HFA;VENTOLIN HFA) 108 (90 Base) MCG/ACT inhaler; Inhale 2 puffs into the lungs every 6 (six) hours as needed for wheezing.  Dispense: 1 Inhaler; Refill: 0  Call if symptoms change or if not rapidly improving.     3. Psoriatic arthritis ([place]) Doing well with current medications and is due for refill- HYDROcodone-acetaminophen (NORCO) 7.5-325 MG  tablet; Take 1 tablet by mouth 3 (three) times daily as needed for moderate pain.  Dispense: 90 tablet; Refill: 0       Lelon Huh, MD  [facility]

## 2017-05-09 ENCOUNTER — Other Ambulatory Visit: Payer: Self-pay | Admitting: Family Medicine

## 2017-05-18 ENCOUNTER — Other Ambulatory Visit: Payer: Self-pay | Admitting: Family Medicine

## 2017-05-18 NOTE — Telephone Encounter (Signed)
Please review

## 2017-05-24 ENCOUNTER — Encounter (INDEPENDENT_AMBULATORY_CARE_PROVIDER_SITE_OTHER): Payer: Medicare Other

## 2017-05-24 ENCOUNTER — Ambulatory Visit (INDEPENDENT_AMBULATORY_CARE_PROVIDER_SITE_OTHER): Payer: Medicare Other | Admitting: Vascular Surgery

## 2017-06-01 ENCOUNTER — Other Ambulatory Visit: Payer: Self-pay | Admitting: Family Medicine

## 2017-06-01 DIAGNOSIS — L4050 Arthropathic psoriasis, unspecified: Secondary | ICD-10-CM

## 2017-06-01 MED ORDER — HYDROCODONE-ACETAMINOPHEN 7.5-325 MG PO TABS: 1.0000 | ORAL_TABLET | Freq: Three times a day (TID) | ORAL | 0 refills | Status: DC | PRN

## 2017-06-01 NOTE — Telephone Encounter (Signed)
Please review. Thanks!  

## 2017-06-01 NOTE — Telephone Encounter (Signed)
Pt contacted office for refill request on the following medications:  HYDROcodone-acetaminophen ([place]) 7.5-325 MG tablet  [facility] [address]/[place]  Last Rx: 05/01/17 LOV: 05/01/17 Please advise. Thanks TNP

## 2017-06-06 DIAGNOSIS — M5136 Other intervertebral disc degeneration, lumbar region: Secondary | ICD-10-CM | POA: Diagnosis not present

## 2017-06-06 DIAGNOSIS — L4050 Arthropathic psoriasis, unspecified: Secondary | ICD-10-CM | POA: Diagnosis not present

## 2017-06-06 DIAGNOSIS — M25552 Pain in left hip: Secondary | ICD-10-CM | POA: Diagnosis not present

## 2017-06-06 DIAGNOSIS — M25551 Pain in right hip: Secondary | ICD-10-CM | POA: Diagnosis not present

## 2017-06-06 DIAGNOSIS — Z79899 Other long term (current) drug therapy: Secondary | ICD-10-CM | POA: Diagnosis not present

## 2017-06-06 DIAGNOSIS — M160 Bilateral primary osteoarthritis of hip: Secondary | ICD-10-CM | POA: Diagnosis not present

## 2017-06-27 ENCOUNTER — Ambulatory Visit: Payer: Medicare Other | Admitting: Family Medicine

## 2017-06-27 ENCOUNTER — Encounter: Payer: Self-pay | Admitting: Family Medicine

## 2017-06-27 VITALS — BP 122/64 | HR 73 | Temp 98.6°F | Resp 18 | Wt 215.0 lb

## 2017-06-27 DIAGNOSIS — R42 Dizziness and giddiness: Secondary | ICD-10-CM

## 2017-06-27 MED ORDER — MECLIZINE HCL 25 MG PO TABS: 25.0000 mg | ORAL_TABLET | Freq: Three times a day (TID) | ORAL | 0 refills | Status: AC | PRN

## 2017-06-27 NOTE — Progress Notes (Signed)
Patient: Edward Mejia Male    DOB: Nov 20, 1945   72 y.o.   MRN: [id number] Visit Date: 06/27/2017  Today's Provider: Lelon Huh, MD   Chief Complaint  Patient presents with  . Dizziness    x 1 week   Subjective:    Dizziness  This is a new problem. Episode onset: 1 week ago. The problem has been gradually worsening. Associated symptoms include diaphoresis. Pertinent negatives include no abdominal pain, chest pain, chills, fever, headaches, nausea, neck pain, numbness or vomiting. The symptoms are aggravated by walking (also movement). He has tried rest for the symptoms. The treatment provided mild relief.  He states that when he sits up or turns his head he feels like the room is spinning, but has also had several episodes feeling light headed when he stands up quickly.      Allergies  Allergen Reactions  . Pravachol  [Pravastatin]     Fatigue     Current Outpatient Medications:  .  cyclobenzaprine (FLEXERIL) 5 MG tablet, Take 5 mg by mouth 1 day or 1 dose., Disp: , Rfl: 1 .  doxycycline (VIBRA-TABS) 100 MG tablet, Take 1 tablet (100 mg total) by mouth 2 (two) times daily., Disp: 20 tablet, Rfl: 0 .  etanercept (ENBREL) 50 MG/ML injection, Inject into the skin once a week., Disp: , Rfl:  .  FOLIC ACID PO, Take by mouth daily., Disp: , Rfl:  .  HYDROcodone-acetaminophen (NORCO) 7.5-325 MG tablet, Take 1 tablet by mouth 3 (three) times daily as needed for moderate pain., Disp: 90 tablet, Rfl: 0 .  methotrexate (RHEUMATREX) 2.5 MG tablet, TAKE 5  TABLETS BY MOUTH EACH WEEK, Disp: , Rfl:  .  misoprostol (CYTOTEC) 100 MCG tablet, Take 1 tablet by mouth daily., Disp: , Rfl:  .  MULTIPLE VITAMIN PO, Take 1 tablet by mouth daily., Disp: , Rfl:  .  OMEGA-3 FATTY ACIDS PO, Take 2 capsules by mouth daily., Disp: , Rfl:  .  predniSONE (DELTASONE) 5 MG tablet, Take 7.5 mg by mouth daily with breakfast. , Disp: , Rfl:  .  valsartan-hydrochlorothiazide (DIOVAN-HCT) 80-12.5 MG  tablet, TAKE 1 TABLET BY MOUTH DAILY, Disp: 90 tablet, Rfl: 4 .  valsartan (DIOVAN) 80 MG tablet, Take 80 mg daily by mouth., Disp: , Rfl:   Review of Systems  Constitutional: Positive for diaphoresis. Negative for appetite change, chills and fever.  Eyes: Positive for visual disturbance (blurred vision).  Respiratory: Negative for chest tightness, shortness of breath and wheezing.   Cardiovascular: Negative for chest pain and palpitations.  Gastrointestinal: Negative for abdominal pain, nausea and vomiting.  Musculoskeletal: Negative for neck pain.  Neurological: Positive for dizziness. Negative for numbness and headaches.    Social History   Tobacco Use  . Smoking status: Never Smoker  . Smokeless tobacco: Never Used  Substance Use Topics  . Alcohol use: No    Alcohol/week: 0.0 oz   Objective:   BP 122/64 (BP Location: Right Arm, Patient Position: Sitting, Cuff Size: Large)   Pulse 73   Temp 98.6 F (37 C) (Oral)   Resp 18   Wt 215 lb (97.5 kg)   SpO2 97% Comment: room air  BMI 28.37 kg/m  Vitals:   06/27/17 1527  BP: 122/64  Pulse: 73  Resp: 18  Temp: 98.6 F (37 C)  TempSrc: Oral  SpO2: 97%  Weight: 215 lb (97.5 kg)    Orthostatic Vitals: Lying- BP: 126/70  HR: 70 Sitting-  BP: 126/62  HR 69 Standing- BP:  120/64   HR 70   Physical Exam   General Appearance:    Alert, cooperative, no distress  Eyes:    PERRL, conjunctiva/corneas clear, EOM's intact. Lateral nystagmus. Spinning sensation provoked with   Lungs:     Clear to auscultation bilaterally, respirations unlabored  Heart:    Regular rate and rhythm  Neurologic:   Awake, alert, oriented x 3. No apparent focal neurological           defect.           Assessment & Plan:     1. Dizziness  - EKG 12-Lead - Comprehensive metabolic panel - CBC  2. Vertigo Likely BPV.  - meclizine (ANTIVERT) 25 MG tablet; Take 1 tablet (25 mg total) by mouth 3 (three) times daily as needed for dizziness.   Dispense: 30 tablet; Refill: 0       Lelon Huh, MD  [facility]

## 2017-06-27 NOTE — Patient Instructions (Signed)

## 2017-06-28 ENCOUNTER — Telehealth: Payer: Self-pay

## 2017-06-28 LAB — CBC
HEMOGLOBIN: 17.2 g/dL (ref 13.0–17.7)
Hematocrit: 50 % (ref 37.5–51.0)
MCH: 31.6 pg (ref 26.6–33.0)
MCHC: 34.4 g/dL (ref 31.5–35.7)
MCV: 92 fL (ref 79–97)
Platelets: 341 10*3/uL (ref 150–450)
RBC: 5.44 x10E6/uL (ref 4.14–5.80)
RDW: 16.1 % — ABNORMAL HIGH (ref 12.3–15.4)
WBC: 10.3 10*3/uL (ref 3.4–10.8)

## 2017-06-28 LAB — COMPREHENSIVE METABOLIC PANEL
ALK PHOS: 37 IU/L — AB (ref 39–117)
ALT: 20 IU/L (ref 0–44)
AST: 20 IU/L (ref 0–40)
Albumin/Globulin Ratio: 2.6 — ABNORMAL HIGH (ref 1.2–2.2)
Albumin: 4.4 g/dL (ref 3.5–4.8)
BUN/Creatinine Ratio: 10 (ref 10–24)
BUN: 11 mg/dL (ref 8–27)
Bilirubin Total: 1 mg/dL (ref 0.0–1.2)
CALCIUM: 9.4 mg/dL (ref 8.6–10.2)
CO2: 24 mmol/L (ref 20–29)
CREATININE: 1.1 mg/dL (ref 0.76–1.27)
Chloride: 98 mmol/L (ref 96–106)
GFR calc Af Amer: 77 mL/min/{1.73_m2} (ref >59)
GFR, EST NON AFRICAN AMERICAN: 67 mL/min/{1.73_m2} (ref >59)
GLOBULIN, TOTAL: 1.7 g/dL (ref 1.5–4.5)
GLUCOSE: 105 mg/dL — AB (ref 65–99)
Potassium: 5 mmol/L (ref 3.5–5.2)
SODIUM: 137 mmol/L (ref 134–144)
Total Protein: 6.1 g/dL (ref 6.0–8.5)

## 2017-06-28 NOTE — Telephone Encounter (Signed)
Patient advised as below.  

## 2017-06-28 NOTE — Telephone Encounter (Signed)
-----   Message from Birdie Sons, MD sent at 06/28/2017  8:13 AM EDT ----- Labs are all normal. Dizziness is due to vertigo from irritation in inner ear. Can take meclizine prn. Should resolve within a week. Call if not much better over weekend.

## 2017-06-30 ENCOUNTER — Other Ambulatory Visit: Payer: Self-pay | Admitting: Family Medicine

## 2017-06-30 DIAGNOSIS — L4050 Arthropathic psoriasis, unspecified: Secondary | ICD-10-CM

## 2017-06-30 MED ORDER — HYDROCODONE-ACETAMINOPHEN 7.5-325 MG PO TABS: 1.0000 | ORAL_TABLET | Freq: Three times a day (TID) | ORAL | 0 refills | Status: DC | PRN

## 2017-06-30 NOTE — Telephone Encounter (Signed)
Patient needs refill of Hydrocodone  Acet.  7.5 - 325 mg. Sent to [facility] on  [address] of [place].

## 2017-07-31 ENCOUNTER — Other Ambulatory Visit: Payer: Self-pay | Admitting: *Deleted

## 2017-07-31 DIAGNOSIS — L4050 Arthropathic psoriasis, unspecified: Secondary | ICD-10-CM

## 2017-08-02 MED ORDER — HYDROCODONE-ACETAMINOPHEN 7.5-325 MG PO TABS: 1.0000 | ORAL_TABLET | Freq: Three times a day (TID) | ORAL | 0 refills | Status: DC | PRN

## 2017-08-02 NOTE — Telephone Encounter (Signed)
Please advise refill? 

## 2017-08-28 ENCOUNTER — Encounter: Payer: Self-pay | Admitting: Family Medicine

## 2017-08-28 ENCOUNTER — Ambulatory Visit: Payer: Medicare Other | Admitting: Family Medicine

## 2017-08-28 VITALS — BP 120/70 | HR 72 | Temp 98.1°F | Resp 16 | Wt 215.0 lb

## 2017-08-28 DIAGNOSIS — L03811 Cellulitis of head [any part, except face]: Secondary | ICD-10-CM | POA: Diagnosis not present

## 2017-08-28 DIAGNOSIS — L4050 Arthropathic psoriasis, unspecified: Secondary | ICD-10-CM

## 2017-08-28 MED ORDER — HYDROCODONE-ACETAMINOPHEN 7.5-325 MG PO TABS: 1.0000 | ORAL_TABLET | Freq: Three times a day (TID) | ORAL | 0 refills | Status: DC | PRN

## 2017-08-28 MED ORDER — CEPHALEXIN 500 MG PO CAPS: 500.0000 mg | ORAL_CAPSULE | Freq: Four times a day (QID) | ORAL | 0 refills | Status: AC

## 2017-08-28 NOTE — Progress Notes (Signed)
Patient: Edward Mejia Male    DOB: 01/31/1946   72 y.o.   MRN: [id number] Visit Date: 08/28/2017  Today's Provider: Lelon Huh, MD   Chief Complaint  Patient presents with  . Sore Throat   Subjective:    HPI  Patient has had sores on the top of his head for 2 weeks. Sores are where patient has had psoriasis for years. Sores have worsened and begun to drain a clear yellow fluid. Patient states they are slightly itchy and sore to the touch. Patient also states sores have come up on both arms 3 days ago.   He is also taking hydrocodone/apap for chronic pain secondary to psoriatic arthritis which is fairly well controlled.   Allergies  Allergen Reactions  . Pravachol  [Pravastatin]     Fatigue     Current Outpatient Medications:  .  cyclobenzaprine (FLEXERIL) 5 MG tablet, Take 5 mg by mouth 1 day or 1 dose., Disp: , Rfl: 1 .  etanercept (ENBREL) 50 MG/ML injection, Inject into the skin once a week., Disp: , Rfl:  .  FOLIC ACID PO, Take by mouth daily., Disp: , Rfl:  .  HYDROcodone-acetaminophen (NORCO) 7.5-325 MG tablet, Take 1 tablet by mouth 3 (three) times daily as needed for moderate pain., Disp: 90 tablet, Rfl: 0 .  misoprostol (CYTOTEC) 100 MCG tablet, Take 1 tablet by mouth daily., Disp: , Rfl:  .  MULTIPLE VITAMIN PO, Take 1 tablet by mouth daily., Disp: , Rfl:  .  OMEGA-3 FATTY ACIDS PO, Take 2 capsules by mouth daily., Disp: , Rfl:  .  predniSONE (DELTASONE) 5 MG tablet, Take 7.5 mg by mouth daily with breakfast. , Disp: , Rfl:  .  valsartan-hydrochlorothiazide (DIOVAN-HCT) 80-12.5 MG tablet, TAKE 1 TABLET BY MOUTH DAILY, Disp: 90 tablet, Rfl: 4 .  doxycycline (VIBRA-TABS) 100 MG tablet, Take 1 tablet (100 mg total) by mouth 2 (two) times daily. (Patient not taking: Reported on 08/28/2017), Disp: 20 tablet, Rfl: 0 .  valsartan (DIOVAN) 80 MG tablet, Take 80 mg daily by mouth., Disp: , Rfl:   Review of Systems  Constitutional: Negative for appetite change,  chills and fever.  Respiratory: Negative for chest tightness, shortness of breath and wheezing.   Cardiovascular: Negative for chest pain and palpitations.  Gastrointestinal: Negative for abdominal pain, nausea and vomiting.    Social History   Tobacco Use  . Smoking status: Never Smoker  . Smokeless tobacco: Never Used  Substance Use Topics  . Alcohol use: No    Alcohol/week: 0.0 oz   Objective:   BP 120/70 (BP Location: Right Arm, Patient Position: Sitting, Cuff Size: Large)   Pulse 72   Temp 98.1 F (36.7 C) (Oral)   Resp 16   Wt 215 lb (97.5 kg)   SpO2 98%   BMI 28.37 kg/m  Vitals:   08/28/17 0902  BP: 120/70  Pulse: 72  Resp: 16  Temp: 98.1 F (36.7 C)  TempSrc: Oral  SpO2: 98%  Weight: 215 lb (97.5 kg)     Physical Exam  General appearance: alert, well developed, well nourished, cooperative and in no distress Head: Normocephalic, without obvious abnormality, atraumatic Respiratory: Respirations even and unlabored, normal respiratory rate Extremities: No gross deformities Skin: Several mildly erythematous papular lesions across both arms. Several yellow scabbed lesions across top of scalp. Small a amout of purulent drainage.     Assessment & Plan:     1. Cellulitis of head except  face Swabbed discharge for culture. Start cephalexin while awaiting culture results.   2. Psoriatic arthritis ([place]) Arthritic pains fairly well controlled. Refill- HYDROcodone-acetaminophen (NORCO) 7.5-325 MG tablet; Take 1 tablet by mouth 3 (three) times daily as needed for moderate pain.  Dispense: 90 tablet; Refill: 0       Lelon Huh, MD  [facility]

## 2017-08-30 ENCOUNTER — Telehealth: Payer: Self-pay | Admitting: Family Medicine

## 2017-08-30 NOTE — Telephone Encounter (Signed)
Patient's wife Edward Mejia advised final results are not back yet.

## 2017-08-30 NOTE — Telephone Encounter (Signed)
Pt's wife [name] called stating they were told by Dr. Caryn Section they would be called this morning with the results to pt's culture that was done 08/28/17. [name] is requesting call back to get the results. Please advise. Thanks TNP

## 2017-08-31 ENCOUNTER — Telehealth: Payer: Self-pay | Admitting: *Deleted

## 2017-08-31 LAB — AEROBIC CULTURE

## 2017-08-31 MED ORDER — SULFAMETHOXAZOLE-TRIMETHOPRIM 800-160 MG PO TABS: 2.0000 | ORAL_TABLET | Freq: Two times a day (BID) | ORAL | 0 refills | Status: DC

## 2017-08-31 NOTE — Telephone Encounter (Signed)
Patient's wife [name] was notified of results. Expressed understanding. Rx was sent to pharmacy. F/u appt scheduled for 09/11/2017 @ 11:00 am.

## 2017-08-31 NOTE — Telephone Encounter (Signed)
-----   Message from Birdie Sons, MD sent at 08/31/2017 12:04 PM EDT ----- Culture grew MRSA which is resistant to cephalexin. Need to d/c cephalexin and start septra DS 2 tablets twice daily for 10 days and follow up to recheck in 10-14 days.

## 2017-09-06 DIAGNOSIS — Z79899 Other long term (current) drug therapy: Secondary | ICD-10-CM | POA: Diagnosis not present

## 2017-09-06 DIAGNOSIS — M5136 Other intervertebral disc degeneration, lumbar region: Secondary | ICD-10-CM | POA: Diagnosis not present

## 2017-09-06 DIAGNOSIS — L4050 Arthropathic psoriasis, unspecified: Secondary | ICD-10-CM | POA: Diagnosis not present

## 2017-09-08 NOTE — Progress Notes (Signed)
Patient: Edward Mejia Male    DOB: 1945-08-23   72 y.o.   MRN: [id number] Visit Date: 09/11/2017  Today's Provider: Lelon Huh, MD   Chief Complaint  Patient presents with  . Cellulitis    recheck   Subjective:    HPI  Cellulitis of head except face From 08/28/2017-Swabbed discharge for culture. Started cephalexin while awaiting culture results. Culture showed MRSA. Discontinued cephalexin and started septra DS 2 tablets bid x 10 days. Advised to rechecked in 14 days. Today patient comes in reporting good compliance with treatment, good tolerance. He states the infection is still draining yellow fluid in his scalp, but lesions on his arms have cleared.   He was also seen by rheumatology last week and had routine labs showing. Low sodium of 128 and elevated potassium of 5.4, and increase in has creatinine to 1.51.He has felt very fatigued.       Allergies  Allergen Reactions  . Pravachol  [Pravastatin]     Fatigue     Current Outpatient Medications:  .  cyclobenzaprine (FLEXERIL) 5 MG tablet, Take 5 mg by mouth 1 day or 1 dose., Disp: , Rfl: 1 .  etanercept (ENBREL) 50 MG/ML injection, Inject into the skin once a week., Disp: , Rfl:  .  FOLIC ACID PO, Take by mouth daily., Disp: , Rfl:  .  HYDROcodone-acetaminophen (NORCO) 7.5-325 MG tablet, Take 1 tablet by mouth 3 (three) times daily as needed for moderate pain., Disp: 90 tablet, Rfl: 0 .  misoprostol (CYTOTEC) 100 MCG tablet, Take 1 tablet by mouth daily., Disp: , Rfl:  .  MULTIPLE VITAMIN PO, Take 1 tablet by mouth daily., Disp: , Rfl:  .  OMEGA-3 FATTY ACIDS PO, Take 2 capsules by mouth daily., Disp: , Rfl:  .  predniSONE (DELTASONE) 5 MG tablet, Take 7.5 mg by mouth daily with breakfast. , Disp: , Rfl:  .  valsartan-hydrochlorothiazide (DIOVAN-HCT) 80-12.5 MG tablet, TAKE 1 TABLET BY MOUTH DAILY, Disp: 90 tablet, Rfl: 4 .  sulfamethoxazole-trimethoprim (BACTRIM DS,SEPTRA DS) 800-160 MG tablet, Take 2  tablets by mouth 2 (two) times daily. (Patient not taking: Reported on 09/11/2017), Disp: 40 tablet, Rfl: 0  Review of Systems  Constitutional: Negative for appetite change, chills and fever.  Respiratory: Negative for chest tightness, shortness of breath and wheezing.   Cardiovascular: Negative for chest pain and palpitations.  Gastrointestinal: Negative for abdominal pain, nausea and vomiting.  Skin: Positive for wound.    Social History   Tobacco Use  . Smoking status: Never Smoker  . Smokeless tobacco: Never Used  Substance Use Topics  . Alcohol use: No    Alcohol/week: 0.0 oz   Objective:   BP 135/72 (BP Location: Right Arm, Patient Position: Sitting, Cuff Size: Large)   Pulse 67   Temp 97.9 F (36.6 C) (Oral)   Resp 16   Wt 213 lb (96.6 kg)   BMI 28.10 kg/m     Physical Exam   General Appearance:    Alert, cooperative, no distress  Eyes:    PERRL, conjunctiva/corneas clear, EOM's intact       Lungs:     Clear to auscultation bilaterally, respirations unlabored  Heart:    Regular rate and rhythm  Skin:   Several scabbed lesions across scalp, minimal erythema, but improved from last week.           Assessment & Plan:     1. Hyponatremia  - Comprehensive metabolic panel -  TSH  2. Cellulitis of head except face Improving, but not resolved.  3. MRSA (methicillin resistant staph aureus) culture positive He did not tolerate sulfa antibiotic very well due to GI upset. Will change to doxycycline.         Lelon Huh, MD  [facility]

## 2017-09-11 ENCOUNTER — Encounter: Payer: Self-pay | Admitting: Family Medicine

## 2017-09-11 ENCOUNTER — Ambulatory Visit: Payer: Medicare Other | Admitting: Family Medicine

## 2017-09-11 VITALS — BP 135/72 | HR 67 | Temp 97.9°F | Resp 16 | Wt 213.0 lb

## 2017-09-11 DIAGNOSIS — L03811 Cellulitis of head [any part, except face]: Secondary | ICD-10-CM

## 2017-09-11 DIAGNOSIS — Z22322 Carrier or suspected carrier of Methicillin resistant Staphylococcus aureus: Secondary | ICD-10-CM | POA: Diagnosis not present

## 2017-09-11 DIAGNOSIS — E871 Hypo-osmolality and hyponatremia: Secondary | ICD-10-CM

## 2017-09-11 MED ORDER — DOXYCYCLINE HYCLATE 100 MG PO TABS: 100.0000 mg | ORAL_TABLET | Freq: Two times a day (BID) | ORAL | 0 refills | Status: AC

## 2017-09-12 ENCOUNTER — Telehealth: Payer: Self-pay

## 2017-09-12 DIAGNOSIS — E871 Hypo-osmolality and hyponatremia: Secondary | ICD-10-CM

## 2017-09-12 LAB — COMPREHENSIVE METABOLIC PANEL
ALK PHOS: 38 IU/L — AB (ref 39–117)
ALT: 25 IU/L (ref 0–44)
AST: 21 IU/L (ref 0–40)
Albumin/Globulin Ratio: 2.3 — ABNORMAL HIGH (ref 1.2–2.2)
Albumin: 4.5 g/dL (ref 3.5–4.8)
BUN/Creatinine Ratio: 9 — ABNORMAL LOW (ref 10–24)
BUN: 12 mg/dL (ref 8–27)
Bilirubin Total: 0.9 mg/dL (ref 0.0–1.2)
CALCIUM: 9.2 mg/dL (ref 8.6–10.2)
CO2: 21 mmol/L (ref 20–29)
CREATININE: 1.39 mg/dL — AB (ref 0.76–1.27)
Chloride: 90 mmol/L — ABNORMAL LOW (ref 96–106)
GFR calc Af Amer: 58 mL/min/{1.73_m2} — ABNORMAL LOW (ref >59)
GFR, EST NON AFRICAN AMERICAN: 50 mL/min/{1.73_m2} — AB (ref >59)
GLUCOSE: 76 mg/dL (ref 65–99)
Globulin, Total: 2 g/dL (ref 1.5–4.5)
Potassium: 5 mmol/L (ref 3.5–5.2)
Sodium: 127 mmol/L — ABNORMAL LOW (ref 134–144)
Total Protein: 6.5 g/dL (ref 6.0–8.5)

## 2017-09-12 LAB — TSH: TSH: 4 u[IU]/mL (ref 0.450–4.500)

## 2017-09-12 NOTE — Telephone Encounter (Signed)
Pt's wife advised. Lab slip printed.

## 2017-09-12 NOTE — Telephone Encounter (Signed)
-----   Message from Birdie Sons, MD sent at 09/12/2017 12:40 PM EDT ----- Kidney functions are better, but not back to normal. Sodium level is low. Potassium is normal. Try to drink 1-2 more glasses of water and can get a little more salt in diet. Recheck in 2 weeks. Will leave order at lab.

## 2017-09-21 ENCOUNTER — Telehealth: Payer: Self-pay | Admitting: *Deleted

## 2017-09-21 MED ORDER — DOXYCYCLINE HYCLATE 100 MG PO TABS: 100.0000 mg | ORAL_TABLET | Freq: Two times a day (BID) | ORAL | 0 refills | Status: DC

## 2017-09-21 NOTE — Telephone Encounter (Addendum)
Patient's wife [name] called concerning the sore on the top of pt's head. Patient has completed antibiotics but infection has not cleared up. [name] stated sore still has yellow pus and redness. Patient states his head is very sore to the touch. [name] also stated sore looks worse than it did at last ov. [name] wanted to know if pt needs to be seen again or if another round of antibiotics should be in to pharmacy? Please advise?

## 2017-09-21 NOTE — Telephone Encounter (Signed)
Edward Mejia was advised. Edward Mejia stated patient sees [facility] and they will call to schedule an appt with them today. Rx sent to pharmacy.

## 2017-09-21 NOTE — Telephone Encounter (Signed)
Needs referral to dermatology. Can send in refill for doxycycline 100mg  twice a day for 10 days.

## 2017-09-25 ENCOUNTER — Other Ambulatory Visit: Payer: Self-pay | Admitting: Family Medicine

## 2017-09-25 ENCOUNTER — Telehealth: Payer: Self-pay | Admitting: Family Medicine

## 2017-09-25 DIAGNOSIS — L4050 Arthropathic psoriasis, unspecified: Secondary | ICD-10-CM

## 2017-09-25 DIAGNOSIS — E871 Hypo-osmolality and hyponatremia: Secondary | ICD-10-CM | POA: Diagnosis not present

## 2017-09-25 MED ORDER — HYDROCODONE-ACETAMINOPHEN 7.5-325 MG PO TABS: 1.0000 | ORAL_TABLET | Freq: Three times a day (TID) | ORAL | 0 refills | Status: DC | PRN

## 2017-09-25 NOTE — Telephone Encounter (Signed)
Patient due to recheck CMP please advise and print future order from 09-12-17

## 2017-09-25 NOTE — Telephone Encounter (Signed)
Pt's wife contacted office for refill request on the following medications:  HYDROcodone-acetaminophen ([place]) 7.5-325 MG tablet   [facility] [address]  Last Rx: 08/28/17 LOV: 09/11/17 Please advise. Thanks TNP

## 2017-09-26 DIAGNOSIS — L570 Actinic keratosis: Secondary | ICD-10-CM | POA: Diagnosis not present

## 2017-09-26 DIAGNOSIS — L0889 Other specified local infections of the skin and subcutaneous tissue: Secondary | ICD-10-CM | POA: Diagnosis not present

## 2017-09-26 DIAGNOSIS — Z85828 Personal history of other malignant neoplasm of skin: Secondary | ICD-10-CM | POA: Diagnosis not present

## 2017-09-26 DIAGNOSIS — L309 Dermatitis, unspecified: Secondary | ICD-10-CM | POA: Diagnosis not present

## 2017-09-26 DIAGNOSIS — L011 Impetiginization of other dermatoses: Secondary | ICD-10-CM | POA: Diagnosis not present

## 2017-09-26 LAB — COMPREHENSIVE METABOLIC PANEL
ALK PHOS: 34 IU/L — AB (ref 39–117)
ALT: 33 IU/L (ref 0–44)
AST: 24 IU/L (ref 0–40)
Albumin/Globulin Ratio: 2.2 (ref 1.2–2.2)
Albumin: 4.1 g/dL (ref 3.5–4.8)
BILIRUBIN TOTAL: 1.1 mg/dL (ref 0.0–1.2)
BUN/Creatinine Ratio: 9 — ABNORMAL LOW (ref 10–24)
BUN: 10 mg/dL (ref 8–27)
CHLORIDE: 93 mmol/L — AB (ref 96–106)
CO2: 26 mmol/L (ref 20–29)
CREATININE: 1.11 mg/dL (ref 0.76–1.27)
Calcium: 9.1 mg/dL (ref 8.6–10.2)
GFR calc Af Amer: 76 mL/min/{1.73_m2} (ref >59)
GFR calc non Af Amer: 66 mL/min/{1.73_m2} (ref >59)
GLOBULIN, TOTAL: 1.9 g/dL (ref 1.5–4.5)
GLUCOSE: 80 mg/dL (ref 65–99)
Potassium: 4.6 mmol/L (ref 3.5–5.2)
SODIUM: 134 mmol/L (ref 134–144)
Total Protein: 6 g/dL (ref 6.0–8.5)

## 2017-09-26 NOTE — Telephone Encounter (Signed)
Patient had labs done 09/25/2017.

## 2017-10-20 ENCOUNTER — Ambulatory Visit: Payer: Medicare Other | Admitting: Family Medicine

## 2017-10-20 ENCOUNTER — Encounter: Payer: Self-pay | Admitting: Family Medicine

## 2017-10-20 ENCOUNTER — Ambulatory Visit
Admission: RE | Admit: 2017-10-20 | Discharge: 2017-10-20 | Disposition: A | Discharge status: Home / Self Care | Payer: Medicare Other | Source: Ambulatory Visit | Attending: Family Medicine | Admitting: Family Medicine

## 2017-10-20 VITALS — BP 133/69 | HR 72 | Temp 97.7°F | Resp 16 | Wt 216.0 lb

## 2017-10-20 DIAGNOSIS — R0781 Pleurodynia: Secondary | ICD-10-CM | POA: Diagnosis not present

## 2017-10-20 DIAGNOSIS — R0789 Other chest pain: Secondary | ICD-10-CM

## 2017-10-20 DIAGNOSIS — L4050 Arthropathic psoriasis, unspecified: Secondary | ICD-10-CM | POA: Diagnosis not present

## 2017-10-20 DIAGNOSIS — M5136 Other intervertebral disc degeneration, lumbar region: Secondary | ICD-10-CM | POA: Diagnosis not present

## 2017-10-20 DIAGNOSIS — M461 Sacroiliitis, not elsewhere classified: Secondary | ICD-10-CM | POA: Diagnosis not present

## 2017-10-20 NOTE — Progress Notes (Signed)
       Patient: Edward Mejia Male    DOB: 1945/09/01   72 y.o.   MRN: [id number] Visit Date: 10/20/2017  Today's Provider: Lelon Huh, MD   No chief complaint on file.  Subjective:    HPI  Right rib soreness and swelling for 4 days. Patient states it feels like there is swelling or fluid in that area that shifts around when he moves. Patient states pain in rib has started moving to center of chest. Patient states there id no shortness of breath. Discomfort feels like it is all near the surface, and not internal. Has no injuries that he know of.     Allergies  Allergen Reactions  . Pravachol  [Pravastatin]     Fatigue     Current Outpatient Medications:  .  cyclobenzaprine (FLEXERIL) 5 MG tablet, Take 5 mg by mouth 1 day or 1 dose., Disp: , Rfl: 1 .  doxycycline (VIBRA-TABS) 100 MG tablet, Take 1 tablet (100 mg total) by mouth 2 (two) times daily., Disp: 20 tablet, Rfl: 0 .  etanercept (ENBREL) 50 MG/ML injection, Inject into the skin once a week., Disp: , Rfl:  .  FOLIC ACID PO, Take by mouth daily., Disp: , Rfl:  .  HYDROcodone-acetaminophen (NORCO) 7.5-325 MG tablet, Take 1 tablet by mouth 3 (three) times daily as needed for moderate pain., Disp: 90 tablet, Rfl: 0 .  misoprostol (CYTOTEC) 100 MCG tablet, Take 1 tablet by mouth daily., Disp: , Rfl:  .  MULTIPLE VITAMIN PO, Take 1 tablet by mouth daily., Disp: , Rfl:  .  OMEGA-3 FATTY ACIDS PO, Take 2 capsules by mouth daily., Disp: , Rfl:  .  predniSONE (DELTASONE) 5 MG tablet, Take 7.5 mg by mouth daily with breakfast. , Disp: , Rfl:  .  valsartan-hydrochlorothiazide (DIOVAN-HCT) 80-12.5 MG tablet, TAKE 1 TABLET BY MOUTH DAILY, Disp: 90 tablet, Rfl: 4  Review of Systems  Constitutional: Negative for appetite change, chills and fever.  Respiratory: Negative for chest tightness, shortness of breath and wheezing.   Cardiovascular: Negative for chest pain and palpitations.  Gastrointestinal: Negative for abdominal pain,  nausea and vomiting.    Social History   Tobacco Use  . Smoking status: Never Smoker  . Smokeless tobacco: Never Used  Substance Use Topics  . Alcohol use: No    Alcohol/week: 0.0 standard drinks   Objective:   BP 133/69 (BP Location: Right Arm, Patient Position: Sitting, Cuff Size: Large)   Pulse 72   Temp 97.7 F (36.5 C) (Oral)   Resp 16   Wt 216 lb (98 kg)   SpO2 97%   BMI 28.50 kg/m  Vitals:   10/20/17 1518  BP: 133/69  Pulse: 72  Resp: 16  Temp: 97.7 F (36.5 C)  TempSrc: Oral  SpO2: 97%  Weight: 216 lb (98 kg)     Physical Exam   General appearance: alert, well developed, well nourished, cooperative and in no distress Head: Normocephalic, without obvious abnormality, atraumatic Respiratory: Respirations even and unlabored, normal respiratory rate Extremities: No gross deformities Skin: Normal. No swelling.       Assessment & Plan:     1. Right-sided chest wall pain Vague symptoms. Normal exam.  - DG Ribs Unilateral Right; Future       Lelon Huh, MD  [facility]

## 2017-10-24 ENCOUNTER — Other Ambulatory Visit: Payer: Self-pay | Admitting: Family Medicine

## 2017-10-24 DIAGNOSIS — L4050 Arthropathic psoriasis, unspecified: Secondary | ICD-10-CM

## 2017-10-24 MED ORDER — HYDROCODONE-ACETAMINOPHEN 7.5-325 MG PO TABS
1.0000 | ORAL_TABLET | Freq: Three times a day (TID) | ORAL | 0 refills | Status: DC | PRN
Start: 2017-10-24 — End: 2017-11-21

## 2017-10-24 NOTE — Telephone Encounter (Signed)
pt needs refill on his  Hydrocodone  7.5-325  He uses [place] and [facility]

## 2017-10-27 DIAGNOSIS — C4442 Squamous cell carcinoma of skin of scalp and neck: Secondary | ICD-10-CM | POA: Diagnosis not present

## 2017-10-27 DIAGNOSIS — Z85828 Personal history of other malignant neoplasm of skin: Secondary | ICD-10-CM | POA: Diagnosis not present

## 2017-10-27 DIAGNOSIS — L0109 Other impetigo: Secondary | ICD-10-CM | POA: Diagnosis not present

## 2017-11-02 ENCOUNTER — Telehealth: Payer: Self-pay

## 2017-11-02 NOTE — Telephone Encounter (Signed)
Called to schedule AWV for pt in 12/2017 and wife states pt has a lot going on with the dermatologist and possible back sx coming up. She states they would be busy for the next couple months and she does not know what's in store for them at the moment. Advised her I would check back in November. She stated that would be fine. Note made. -MM

## 2017-11-08 DIAGNOSIS — C4442 Squamous cell carcinoma of skin of scalp and neck: Secondary | ICD-10-CM | POA: Diagnosis not present

## 2017-11-08 DIAGNOSIS — Z85828 Personal history of other malignant neoplasm of skin: Secondary | ICD-10-CM | POA: Diagnosis not present

## 2017-11-09 DIAGNOSIS — M5136 Other intervertebral disc degeneration, lumbar region: Secondary | ICD-10-CM | POA: Diagnosis not present

## 2017-11-09 DIAGNOSIS — M48061 Spinal stenosis, lumbar region without neurogenic claudication: Secondary | ICD-10-CM | POA: Diagnosis not present

## 2017-11-09 DIAGNOSIS — M47816 Spondylosis without myelopathy or radiculopathy, lumbar region: Secondary | ICD-10-CM | POA: Diagnosis not present

## 2017-11-09 DIAGNOSIS — M545 Low back pain: Secondary | ICD-10-CM | POA: Diagnosis not present

## 2017-11-21 ENCOUNTER — Other Ambulatory Visit: Payer: Self-pay | Admitting: Family Medicine

## 2017-11-21 DIAGNOSIS — L4050 Arthropathic psoriasis, unspecified: Secondary | ICD-10-CM

## 2017-11-21 MED ORDER — HYDROCODONE-ACETAMINOPHEN 7.5-325 MG PO TABS: 1.0000 | ORAL_TABLET | Freq: Three times a day (TID) | ORAL | 0 refills | Status: DC | PRN

## 2017-11-21 NOTE — Telephone Encounter (Signed)
Pt needing refill on: HYDROcodone-acetaminophen (NORCO) 7.5-325 MG tablet   Please fill at:  [id number], [place] [phone] (Phone) [phone] (Fax)    Thanks, [place]

## 2017-11-27 DIAGNOSIS — M47816 Spondylosis without myelopathy or radiculopathy, lumbar region: Secondary | ICD-10-CM | POA: Diagnosis not present

## 2017-11-27 DIAGNOSIS — M5136 Other intervertebral disc degeneration, lumbar region: Secondary | ICD-10-CM | POA: Diagnosis not present

## 2017-11-29 ENCOUNTER — Ambulatory Visit: Payer: Medicare Other

## 2017-12-04 DIAGNOSIS — M5136 Other intervertebral disc degeneration, lumbar region: Secondary | ICD-10-CM | POA: Diagnosis not present

## 2017-12-04 DIAGNOSIS — L4050 Arthropathic psoriasis, unspecified: Secondary | ICD-10-CM | POA: Diagnosis not present

## 2017-12-04 DIAGNOSIS — Z79899 Other long term (current) drug therapy: Secondary | ICD-10-CM | POA: Diagnosis not present

## 2017-12-07 ENCOUNTER — Ambulatory Visit (INDEPENDENT_AMBULATORY_CARE_PROVIDER_SITE_OTHER): Payer: Medicare Other

## 2017-12-07 DIAGNOSIS — Z23 Encounter for immunization: Secondary | ICD-10-CM

## 2017-12-12 DIAGNOSIS — M545 Low back pain: Secondary | ICD-10-CM | POA: Diagnosis not present

## 2017-12-14 ENCOUNTER — Ambulatory Visit: Payer: Medicare Other | Admitting: Physician Assistant

## 2017-12-18 ENCOUNTER — Ambulatory Visit: Payer: Self-pay | Admitting: Family Medicine

## 2017-12-20 ENCOUNTER — Other Ambulatory Visit: Payer: Self-pay | Admitting: Family Medicine

## 2017-12-20 DIAGNOSIS — L4050 Arthropathic psoriasis, unspecified: Secondary | ICD-10-CM

## 2017-12-20 MED ORDER — HYDROCODONE-ACETAMINOPHEN 7.5-325 MG PO TABS: 1.0000 | ORAL_TABLET | Freq: Three times a day (TID) | ORAL | 0 refills | Status: DC | PRN

## 2017-12-20 NOTE — Telephone Encounter (Signed)
Pt's contacted office for refill request on the following medications:  HYDROcodone-acetaminophen ([place]) 7.5-325 MG tablet  [facility] [address]  Please advise. Thanks TNP

## 2017-12-21 DIAGNOSIS — M47816 Spondylosis without myelopathy or radiculopathy, lumbar region: Secondary | ICD-10-CM | POA: Diagnosis not present

## 2017-12-21 DIAGNOSIS — M48061 Spinal stenosis, lumbar region without neurogenic claudication: Secondary | ICD-10-CM | POA: Diagnosis not present

## 2017-12-21 DIAGNOSIS — M5136 Other intervertebral disc degeneration, lumbar region: Secondary | ICD-10-CM | POA: Diagnosis not present

## 2017-12-21 DIAGNOSIS — M19011 Primary osteoarthritis, right shoulder: Secondary | ICD-10-CM | POA: Diagnosis not present

## 2017-12-27 ENCOUNTER — Encounter: Payer: Self-pay | Admitting: Family Medicine

## 2017-12-27 ENCOUNTER — Ambulatory Visit: Payer: Medicare Other | Admitting: Family Medicine

## 2017-12-27 DIAGNOSIS — R071 Chest pain on breathing: Secondary | ICD-10-CM | POA: Diagnosis not present

## 2017-12-27 NOTE — Progress Notes (Signed)
       Patient: Edward Mejia Male    DOB: [birth year]   72 y.o.   MRN: [id number] Visit Date: 12/27/2017  Today's Provider: Lelon Huh, MD   Chief Complaint  Patient presents with  . Chest Pain  . Shortness of Breath   Subjective:    HPI  Shortness of Breath States dyspnea started a few weeks ago and states that the symptoms are getting worse now. Patient states that when he eats and walk he becomes out of breath.  Chest Pain  Patient was seen for right side chest pain on 10/20/2017 and states the pain is becoming worse. Patient states he took 2 Tylenols and his pain medication about 1 pm today (12/27/2017).Pain is worse when exhaling and when eating. Pain is under right breast and sometimes radiates into right shoulder blade. He was initially seen on 10/20/2017 and had negative xrays, but has become more severe and encompassing larger area since then.     Allergies  Allergen Reactions  . Pravachol  [Pravastatin]     Fatigue     Current Outpatient Medications:  .  cyclobenzaprine (FLEXERIL) 5 MG tablet, Take 5 mg by mouth 1 day or 1 dose., Disp: , Rfl: 1 .  doxycycline (VIBRA-TABS) 100 MG tablet, Take 1 tablet (100 mg total) by mouth 2 (two) times daily., Disp: 20 tablet, Rfl: 0 .  etanercept (ENBREL) 50 MG/ML injection, Inject into the skin once a week., Disp: , Rfl:  .  FOLIC ACID PO, Take by mouth daily., Disp: , Rfl:  .  HYDROcodone-acetaminophen (NORCO) 7.5-325 MG tablet, Take 1 tablet by mouth 3 (three) times daily as needed for moderate pain., Disp: 90 tablet, Rfl: 0 .  misoprostol (CYTOTEC) 100 MCG tablet, Take 1 tablet by mouth daily., Disp: , Rfl:  .  MULTIPLE VITAMIN PO, Take 1 tablet by mouth daily., Disp: , Rfl:  .  OMEGA-3 FATTY ACIDS PO, Take 2 capsules by mouth daily., Disp: , Rfl:  .  predniSONE (DELTASONE) 5 MG tablet, Take 7.5 mg by mouth daily with breakfast. , Disp: , Rfl:  .  valsartan-hydrochlorothiazide (DIOVAN-HCT) 80-12.5 MG tablet, TAKE 1  TABLET BY MOUTH DAILY, Disp: 90 tablet, Rfl: 4  Review of Systems  Respiratory: Positive for shortness of breath.   Cardiovascular: Positive for chest pain.    Social History   Tobacco Use  . Smoking status: Never Smoker  . Smokeless tobacco: Never Used  Substance Use Topics  . Alcohol use: No    Alcohol/week: 0.0 standard drinks   Objective:   BP 139/83 (BP Location: Left Arm, Patient Position: Sitting, Cuff Size: Normal)   Pulse 73   Temp 98.3 F (36.8 C) (Oral)   Wt 215 lb 6.4 oz (97.7 kg)   SpO2 97%   BMI 28.42 kg/m  Vitals:   12/27/17 1545  BP: 139/83  Pulse: 73  Temp: 98.3 F (36.8 C)  TempSrc: Oral  SpO2: 97%  Weight: 215 lb 6.4 oz (97.7 kg)     Physical Exam      Assessment & Plan:           Lelon Huh, MD  [facility]

## 2017-12-28 ENCOUNTER — Telehealth: Payer: Self-pay | Admitting: Family Medicine

## 2017-12-28 DIAGNOSIS — R079 Chest pain, unspecified: Secondary | ICD-10-CM

## 2017-12-28 NOTE — Telephone Encounter (Signed)
Lab order printed

## 2017-12-28 NOTE — Telephone Encounter (Signed)
[place] states that pt will need to get labs done to check kidney function before CT is done.Can you put order in ?

## 2017-12-28 NOTE — Telephone Encounter (Signed)
Please advise 

## 2017-12-29 LAB — RENAL FUNCTION PANEL
Albumin: 4.5 g/dL (ref 3.5–4.8)
BUN / CREAT RATIO: 9 — AB (ref 10–24)
BUN: 10 mg/dL (ref 8–27)
CALCIUM: 9 mg/dL (ref 8.6–10.2)
CHLORIDE: 94 mmol/L — AB (ref 96–106)
CO2: 25 mmol/L (ref 20–29)
Creatinine, Ser: 1.06 mg/dL (ref 0.76–1.27)
GFR calc Af Amer: 81 mL/min/{1.73_m2} (ref >59)
GFR calc non Af Amer: 70 mL/min/{1.73_m2} (ref >59)
GLUCOSE: 116 mg/dL — AB (ref 65–99)
POTASSIUM: 4.7 mmol/L (ref 3.5–5.2)
Phosphorus: 3.3 mg/dL (ref 2.5–4.5)
SODIUM: 134 mmol/L (ref 134–144)

## 2018-01-01 ENCOUNTER — Ambulatory Visit
Admission: RE | Admit: 2018-01-01 | Discharge: 2018-01-01 | Disposition: A | Discharge status: Home / Self Care | Payer: Medicare Other | Source: Ambulatory Visit | Attending: Family Medicine | Admitting: Family Medicine

## 2018-01-01 DIAGNOSIS — R071 Chest pain on breathing: Secondary | ICD-10-CM | POA: Diagnosis not present

## 2018-01-01 DIAGNOSIS — R0789 Other chest pain: Secondary | ICD-10-CM | POA: Diagnosis not present

## 2018-01-01 HISTORY — DX: Malignant (primary) neoplasm, unspecified: C80.1

## 2018-01-01 HISTORY — DX: Essential (primary) hypertension: I10

## 2018-01-01 MED ORDER — IOHEXOL 300 MG/ML  SOLN
75.0000 mL | Freq: Once | INTRAMUSCULAR | Status: AC | PRN
  Administered 2018-01-01: 75 mL via INTRAVENOUS

## 2018-01-04 ENCOUNTER — Other Ambulatory Visit: Payer: Self-pay | Admitting: Family Medicine

## 2018-01-04 ENCOUNTER — Encounter: Payer: Self-pay | Admitting: Family Medicine

## 2018-01-04 DIAGNOSIS — R079 Chest pain, unspecified: Secondary | ICD-10-CM

## 2018-01-08 NOTE — Telephone Encounter (Signed)
Wife declines scheduling the AWV now. Wife states pt has a lot going on right now and they will CB when they have availability to schedule.  -MM

## 2018-01-08 NOTE — Telephone Encounter (Signed)
This encounter was created in error - please disregard.

## 2018-01-09 ENCOUNTER — Ambulatory Visit: Payer: Medicare Other

## 2018-01-09 DIAGNOSIS — R079 Chest pain, unspecified: Secondary | ICD-10-CM | POA: Diagnosis not present

## 2018-01-09 DIAGNOSIS — R0602 Shortness of breath: Secondary | ICD-10-CM | POA: Diagnosis not present

## 2018-01-16 ENCOUNTER — Other Ambulatory Visit: Payer: Self-pay

## 2018-01-16 DIAGNOSIS — L4050 Arthropathic psoriasis, unspecified: Secondary | ICD-10-CM

## 2018-01-16 MED ORDER — HYDROCODONE-ACETAMINOPHEN 7.5-325 MG PO TABS: 1.0000 | ORAL_TABLET | Freq: Three times a day (TID) | ORAL | 0 refills | Status: DC | PRN

## 2018-01-24 DIAGNOSIS — R0602 Shortness of breath: Secondary | ICD-10-CM | POA: Diagnosis not present

## 2018-01-24 DIAGNOSIS — R079 Chest pain, unspecified: Secondary | ICD-10-CM | POA: Diagnosis not present

## 2018-01-26 DIAGNOSIS — L821 Other seborrheic keratosis: Secondary | ICD-10-CM | POA: Diagnosis not present

## 2018-01-26 DIAGNOSIS — L814 Other melanin hyperpigmentation: Secondary | ICD-10-CM | POA: Diagnosis not present

## 2018-01-26 DIAGNOSIS — Z85828 Personal history of other malignant neoplasm of skin: Secondary | ICD-10-CM | POA: Diagnosis not present

## 2018-01-26 DIAGNOSIS — D0461 Carcinoma in situ of skin of right upper limb, including shoulder: Secondary | ICD-10-CM | POA: Diagnosis not present

## 2018-01-26 DIAGNOSIS — C44222 Squamous cell carcinoma of skin of right ear and external auricular canal: Secondary | ICD-10-CM | POA: Diagnosis not present

## 2018-01-26 DIAGNOSIS — D225 Melanocytic nevi of trunk: Secondary | ICD-10-CM | POA: Diagnosis not present

## 2018-02-01 DIAGNOSIS — I2510 Atherosclerotic heart disease of native coronary artery without angina pectoris: Secondary | ICD-10-CM | POA: Insufficient documentation

## 2018-02-01 DIAGNOSIS — I10 Essential (primary) hypertension: Secondary | ICD-10-CM | POA: Diagnosis not present

## 2018-02-01 DIAGNOSIS — R0789 Other chest pain: Secondary | ICD-10-CM | POA: Diagnosis not present

## 2018-02-02 ENCOUNTER — Other Ambulatory Visit: Payer: Self-pay | Admitting: Cardiology

## 2018-02-02 DIAGNOSIS — I209 Angina pectoris, unspecified: Secondary | ICD-10-CM

## 2018-02-06 ENCOUNTER — Ambulatory Visit
Admission: RE | Admit: 2018-02-06 | Discharge: 2018-02-06 | Disposition: A | Discharge status: Home / Self Care | Payer: Medicare Other | Attending: Cardiology | Admitting: Cardiology

## 2018-02-06 ENCOUNTER — Other Ambulatory Visit: Payer: Self-pay

## 2018-02-06 ENCOUNTER — Encounter
Admission: RE | Disposition: A | Discharge status: Home / Self Care | Payer: Self-pay | Source: Home / Self Care | Attending: Cardiology

## 2018-02-06 DIAGNOSIS — Z79899 Other long term (current) drug therapy: Secondary | ICD-10-CM | POA: Diagnosis not present

## 2018-02-06 DIAGNOSIS — Z8249 Family history of ischemic heart disease and other diseases of the circulatory system: Secondary | ICD-10-CM | POA: Diagnosis not present

## 2018-02-06 DIAGNOSIS — Z7982 Long term (current) use of aspirin: Secondary | ICD-10-CM | POA: Diagnosis not present

## 2018-02-06 DIAGNOSIS — R079 Chest pain, unspecified: Secondary | ICD-10-CM | POA: Diagnosis not present

## 2018-02-06 DIAGNOSIS — I10 Essential (primary) hypertension: Secondary | ICD-10-CM | POA: Insufficient documentation

## 2018-02-06 DIAGNOSIS — Z87891 Personal history of nicotine dependence: Secondary | ICD-10-CM | POA: Diagnosis not present

## 2018-02-06 DIAGNOSIS — R0602 Shortness of breath: Secondary | ICD-10-CM | POA: Insufficient documentation

## 2018-02-06 DIAGNOSIS — L4050 Arthropathic psoriasis, unspecified: Secondary | ICD-10-CM | POA: Insufficient documentation

## 2018-02-06 DIAGNOSIS — Z888 Allergy status to other drugs, medicaments and biological substances status: Secondary | ICD-10-CM | POA: Insufficient documentation

## 2018-02-06 DIAGNOSIS — I209 Angina pectoris, unspecified: Secondary | ICD-10-CM

## 2018-02-06 DIAGNOSIS — I25119 Atherosclerotic heart disease of native coronary artery with unspecified angina pectoris: Secondary | ICD-10-CM | POA: Diagnosis not present

## 2018-02-06 HISTORY — PX: LEFT HEART CATH AND CORONARY ANGIOGRAPHY: CATH118249

## 2018-02-06 SURGERY — LEFT HEART CATH AND CORONARY ANGIOGRAPHY
Anesthesia: Moderate Sedation | Laterality: Left

## 2018-02-06 MED ORDER — SODIUM CHLORIDE 0.9 % IV SOLN: 250.0000 mL | INTRAVENOUS | Status: DC | PRN

## 2018-02-06 MED ORDER — SODIUM CHLORIDE 0.9% FLUSH: 3.0000 mL | Freq: Two times a day (BID) | INTRAVENOUS | Status: DC

## 2018-02-06 MED ORDER — ONDANSETRON HCL 4 MG/2ML IJ SOLN: 4.0000 mg | Freq: Four times a day (QID) | INTRAMUSCULAR | Status: DC | PRN

## 2018-02-06 MED ORDER — MIDAZOLAM HCL 2 MG/2ML IJ SOLN
INTRAMUSCULAR | Status: AC
  Filled 2018-02-06: qty 2

## 2018-02-06 MED ORDER — SODIUM CHLORIDE 0.9 % WEIGHT BASED INFUSION
3.0000 mL/kg/h | INTRAVENOUS | Status: AC
  Administered 2018-02-06: 3 mL/kg/h via INTRAVENOUS

## 2018-02-06 MED ORDER — MIDAZOLAM HCL 2 MG/2ML IJ SOLN
INTRAMUSCULAR | Status: DC | PRN
  Administered 2018-02-06: 1 mg via INTRAVENOUS

## 2018-02-06 MED ORDER — FENTANYL CITRATE (PF) 100 MCG/2ML IJ SOLN
INTRAMUSCULAR | Status: AC
  Filled 2018-02-06: qty 2

## 2018-02-06 MED ORDER — SODIUM CHLORIDE 0.9 % WEIGHT BASED INFUSION: 1.0000 mL/kg/h | INTRAVENOUS | Status: DC

## 2018-02-06 MED ORDER — IOPAMIDOL (ISOVUE-300) INJECTION 61%
INTRAVENOUS | Status: DC | PRN
  Administered 2018-02-06: 96 mL via INTRA_ARTERIAL

## 2018-02-06 MED ORDER — HEPARIN (PORCINE) IN NACL 1000-0.9 UT/500ML-% IV SOLN
INTRAVENOUS | Status: AC
  Filled 2018-02-06: qty 1000

## 2018-02-06 MED ORDER — ASPIRIN 81 MG PO CHEW: 81.0000 mg | CHEWABLE_TABLET | ORAL | Status: DC

## 2018-02-06 MED ORDER — SODIUM CHLORIDE 0.9% FLUSH: 3.0000 mL | INTRAVENOUS | Status: DC | PRN

## 2018-02-06 MED ORDER — FENTANYL CITRATE (PF) 100 MCG/2ML IJ SOLN
INTRAMUSCULAR | Status: DC | PRN
  Administered 2018-02-06: 25 ug via INTRAVENOUS

## 2018-02-06 MED ORDER — ACETAMINOPHEN 325 MG PO TABS: 650.0000 mg | ORAL_TABLET | ORAL | Status: DC | PRN

## 2018-02-06 SURGICAL SUPPLY — 9 items
CATH INFINITI 5FR ANG PIGTAIL (CATHETERS) ×2 IMPLANT
CATH INFINITI 5FR JL4 (CATHETERS) ×2 IMPLANT
CATH INFINITI JR4 5F (CATHETERS) ×2 IMPLANT
DEVICE CLOSURE MYNXGRIP 5F (Vascular Products) ×2 IMPLANT
KIT MANI 3VAL PERCEP (MISCELLANEOUS) ×2 IMPLANT
NEEDLE PERC 18GX7CM (NEEDLE) ×2 IMPLANT
PACK CARDIAC CATH (CUSTOM PROCEDURE TRAY) ×2 IMPLANT
SHEATH AVANTI 5FR X 11CM (SHEATH) ×2 IMPLANT
WIRE GUIDERIGHT .035X150 (WIRE) ×2 IMPLANT

## 2018-02-06 NOTE — Progress Notes (Signed)
Dr. Ubaldo Glassing at bedside, speaking with pt., wife and family re: cath results. All verbalize understanding of conversation.

## 2018-02-06 NOTE — H&P (Signed)
Chief Complaint: Chief Complaint  Patient presents with  . Follow-up  Echo, Myo  . Chest Pain  when walking the other day to the mailbox, light chest pain  . Shortness of Breath  Date of Service: 02/01/2018 Date of Birth: Feb 11, 1945 PCP: Birdie Sons, MD  History of Present Illness: [name] is a 72 y.o.male patient who referred for evaluation after being noted to have calcification of his coronary artery tree. He does complain exertional chest tightness predominantly on the right. He was having a chest CT for shortness of breath. He has occasional substernal pain but predominantly on his right side under his right rib cage. He has a fullness in his right chest. He denies syncope. He has difficulty ambulating due to bilateral hip pain. Risk factors for coronary disease include hypertension, family history. He is a former smoker but quit 40 years ago. He denies any rapid or irregular heartbeat. He is contemplating surgical correction of his back pain and hip pain. Functional study showed evidence of mild to moderate anterior septal and apical ischemia.  Past Medical and Surgical History  Past Medical History Past Medical History:  Diagnosis Date  . H/O: rheumatic fever  . Hypertension  . Psoriatic arthritis (CMS-HCC)   Past Surgical History He has a past surgical history that includes cataract surgery; stomach surgery; wisdom teeth extraction; and Appendectomy.   Medications and Allergies  Current Medications  Current Outpatient Medications  Medication Sig Dispense Refill  . cyclobenzaprine (FLEXERIL) 5 MG tablet TK 1 T PO BID PRF MSP  . etanercept (ENBREL) 50 mg/mL (0.98 mL) pen injector Inject 50 mg subcutaneously once a week.  . folic acid (FOLVITE) 1 MG tablet Take 2,000 mcg by mouth once daily. 3  . HYDROcodone-acetaminophen (NORCO) 7.5-325 mg tablet TK 1 T PO TID PRF MODERATE P 0  . methotrexate (RHEUMATREX) 2.5 MG tablet Take 7.5 mg by mouth every 7 (seven) days.  .  miSOPROStol (CYTOTEC) 100 MCG tablet Take 100 mcg by mouth once daily.  . predniSONE (DELTASONE) 10 MG tablet Take 7.5 mg by mouth once daily.  . valsartan-hydrochlorothiazide (DIOVAN-HCT) 80-12.5 mg tablet Take 1 tablet by mouth once daily  . aspirin 81 MG EC tablet Take 1 tablet (81 mg total) by mouth once daily 30 tablet 11  . nitroGLYcerin (NITROSTAT) 0.4 MG SL tablet Place 1 tablet (0.4 mg total) under the tongue every 5 (five) minutes as needed for Chest pain May take up to 3 doses. 25 tablet 11   No current facility-administered medications for this visit.   Allergies: Pravastatin  Social and Family History  Social History reports that he has quit smoking. He has never used smokeless tobacco. He reports that he does not drink alcohol or use drugs.  Family History Family History  Problem Relation Age of Onset  . COPD Mother  . Myocardial Infarction (Heart attack) Father  . Cancer Sister  . Cancer Brother  . Cancer Brother   Review of Systems  Review of Systems  Constitutional: Negative for chills, diaphoresis, fever, malaise/fatigue and weight loss.  HENT: Negative for congestion, ear discharge, hearing loss and tinnitus.  Eyes: Negative for blurred vision.  Respiratory: Positive for shortness of breath. Negative for cough, hemoptysis, sputum production and wheezing.  Cardiovascular: Negative for chest pain, palpitations, orthopnea, claudication, leg swelling and PND.  Gastrointestinal: Negative for abdominal pain, blood in stool, constipation, diarrhea, heartburn, melena, nausea and vomiting.  Genitourinary: Negative for dysuria, frequency, hematuria and urgency.  Musculoskeletal: Positive for  back pain, joint pain and myalgias. Negative for falls.  Skin: Negative for itching and rash.  Neurological: Negative for dizziness, tingling, focal weakness, loss of consciousness, weakness and headaches.  Endo/Heme/Allergies: Negative for polydipsia. Does not bruise/bleed easily.   Psychiatric/Behavioral: Negative for depression, memory loss and substance abuse. The patient is not nervous/anxious.   Physical Examination   Vitals:BP 124/74  Pulse 69  Ht 185.4 cm (6\' 1" )  Wt 96.9 kg (213 lb 10 oz)  SpO2 98%  BMI 28.18 kg/m  Ht:185.4 cm (6\' 1" ) Wt:96.9 kg (213 lb 10 oz) EUM:PNTI surface area is 2.23 meters squared. Body mass index is 28.18 kg/m.  Wt Readings from Last 3 Encounters:  02/01/18 96.9 kg (213 lb 10 oz)  01/09/18 97.4 kg (214 lb 12.8 oz)  03/31/15 95.7 kg (211 lb)   BP Readings from Last 3 Encounters:  02/01/18 124/74  01/09/18 134/76  03/31/15 (!) 150/100   General appearance appears in no acute distress  Head Mouth and Eye exam Normocephalic, without obvious abnormality, atraumatic Dentition is good Eyes appear anicteric       LUNGS Breath Sounds: Normal Percussion: Normal  CARDIOVASCULAR JVP CV wave: no HJR: no Elevation at 90 degrees: None Carotid Pulse: normal pulsation bilaterally Bruit: None Apex: apical impulse normal  Auscultation Rhythm: normal sinus rhythm S1: normal S2: normal Clicks: no Rub: no Murmurs: no murmurs  Gallop: None  EXTREMITIES Clubbing: no Edema: 2+ pedal edema on the left and 1+ pedal edema on the right Pulses: peripheral pulses symmetrical Femoral Bruits: no Amputation: no SKIN Rash: no Cyanosis: no Embolic phemonenon: no Bruising: no NEURO Alert and Oriented to person, place and time: yes Non focal: yes  PSYCH: Pt appears to have normal affect  LABS REVIEWED Last 3 CBC results: Lab Results  Component Value Date  WBC 14.8 (H) 02/01/2018   Lab Results  Component Value Date  HGB 18.4 (H) 02/01/2018   Lab Results  Component Value Date  HCT 53.5 (H) 02/01/2018   Lab Results  Component Value Date  PLT 366 02/01/2018   Lab Results  Component Value Date  CREATININE 1.0 02/01/2018  BUN 15 02/01/2018  NA 136 02/01/2018  K 4.4 02/01/2018  CL 93 (L) 02/01/2018  CO2  33.0 (H) 02/01/2018   No results found for: HGBA1C  No results found for: HDL No results found for: LDLCALC No results found for: TRIG  No results found for: ALT, AST, GGT, ALKPHOS, BILITOT  No results found for: TSH  Diagnostic Studies Reviewed:  EKG EKG demonstrated normal sinus rhythm, nonspecific ST and T waves changes.  CT Imaging Chest CT: Calcification of his coronary arteries and descending aorta..  Assessment and Plan   72 y.o. male with  ICD-10-CM  1. SOB (shortness of breath)-patient is short of breath. Unclear etiology. Not a current smoker. Echo showed no significant structural valvular abnormalities. R06.02  2. Chest pain with moderate risk of acute coronary syndrome-heavy calcification of his descending aorta as well as his coronary tree. Functional study showed anteroapical anteroseptal ischemia. We will proceed with left heart cath to evaluate coronary anatomy. Risk and benefits were explained to the patient and his wife and they agree to proceed. R07.9    Return in about 4 weeks (around 03/01/2018).  These notes generated with voice recognition software. I apologize for typographical errors.  Sydnee Levans, MD   Pt seen and examined. No change from above.

## 2018-02-14 ENCOUNTER — Other Ambulatory Visit: Payer: Self-pay | Admitting: Family Medicine

## 2018-02-14 DIAGNOSIS — L4050 Arthropathic psoriasis, unspecified: Secondary | ICD-10-CM

## 2018-02-14 MED ORDER — HYDROCODONE-ACETAMINOPHEN 7.5-325 MG PO TABS: 1.0000 | ORAL_TABLET | Freq: Three times a day (TID) | ORAL | 0 refills | Status: DC | PRN

## 2018-02-14 NOTE — Telephone Encounter (Signed)
Pt needing a refill on: HYDROcodone-acetaminophen (NORCO) 7.5-325 MG tablet  Please fill at:  [id number], [place] [phone] (Phone) [phone] (Fax)   Thanks, [place]

## 2018-02-16 DIAGNOSIS — I25119 Atherosclerotic heart disease of native coronary artery with unspecified angina pectoris: Secondary | ICD-10-CM | POA: Diagnosis not present

## 2018-02-16 DIAGNOSIS — R0789 Other chest pain: Secondary | ICD-10-CM | POA: Diagnosis not present

## 2018-02-16 DIAGNOSIS — I10 Essential (primary) hypertension: Secondary | ICD-10-CM | POA: Diagnosis not present

## 2018-02-19 DIAGNOSIS — C44222 Squamous cell carcinoma of skin of right ear and external auricular canal: Secondary | ICD-10-CM | POA: Diagnosis not present

## 2018-02-19 DIAGNOSIS — Z85828 Personal history of other malignant neoplasm of skin: Secondary | ICD-10-CM | POA: Diagnosis not present

## 2018-03-05 DIAGNOSIS — Z79899 Other long term (current) drug therapy: Secondary | ICD-10-CM | POA: Diagnosis not present

## 2018-03-05 DIAGNOSIS — M5136 Other intervertebral disc degeneration, lumbar region: Secondary | ICD-10-CM | POA: Diagnosis not present

## 2018-03-05 DIAGNOSIS — L4050 Arthropathic psoriasis, unspecified: Secondary | ICD-10-CM | POA: Diagnosis not present

## 2018-03-08 DIAGNOSIS — D0461 Carcinoma in situ of skin of right upper limb, including shoulder: Secondary | ICD-10-CM | POA: Diagnosis not present

## 2018-03-08 DIAGNOSIS — Z85828 Personal history of other malignant neoplasm of skin: Secondary | ICD-10-CM | POA: Diagnosis not present

## 2018-03-14 ENCOUNTER — Other Ambulatory Visit: Payer: Self-pay | Admitting: Family Medicine

## 2018-03-14 DIAGNOSIS — L4050 Arthropathic psoriasis, unspecified: Secondary | ICD-10-CM

## 2018-03-14 MED ORDER — HYDROCODONE-ACETAMINOPHEN 7.5-325 MG PO TABS: 1.0000 | ORAL_TABLET | Freq: Three times a day (TID) | ORAL | 0 refills | Status: DC | PRN

## 2018-03-14 NOTE — Telephone Encounter (Signed)
Donaldo Teegarden Spouse [phone]   HYDROcodone-acetaminophen ([place]) 7.5-325 MG tablet   [facility] [address] stopped by to get a refill on above listed medication.

## 2018-03-15 DIAGNOSIS — I25119 Atherosclerotic heart disease of native coronary artery with unspecified angina pectoris: Secondary | ICD-10-CM | POA: Diagnosis not present

## 2018-03-16 DIAGNOSIS — I25119 Atherosclerotic heart disease of native coronary artery with unspecified angina pectoris: Secondary | ICD-10-CM | POA: Diagnosis not present

## 2018-03-16 DIAGNOSIS — R0789 Other chest pain: Secondary | ICD-10-CM | POA: Diagnosis not present

## 2018-03-16 DIAGNOSIS — I10 Essential (primary) hypertension: Secondary | ICD-10-CM | POA: Diagnosis not present

## 2018-04-10 ENCOUNTER — Other Ambulatory Visit: Payer: Self-pay | Admitting: Family Medicine

## 2018-04-10 DIAGNOSIS — L4050 Arthropathic psoriasis, unspecified: Secondary | ICD-10-CM

## 2018-04-10 NOTE — Telephone Encounter (Signed)
Pt needs a rfill on   Hydrocodone 7.5-325  [facility]  [address]  CB#  [phone]  Thank Con [place]

## 2018-04-11 MED ORDER — HYDROCODONE-ACETAMINOPHEN 7.5-325 MG PO TABS: 1.0000 | ORAL_TABLET | Freq: Three times a day (TID) | ORAL | 0 refills | Status: DC | PRN

## 2018-05-08 ENCOUNTER — Other Ambulatory Visit: Payer: Self-pay | Admitting: *Deleted

## 2018-05-08 DIAGNOSIS — L4050 Arthropathic psoriasis, unspecified: Secondary | ICD-10-CM

## 2018-05-08 MED ORDER — HYDROCODONE-ACETAMINOPHEN 7.5-325 MG PO TABS: 1.0000 | ORAL_TABLET | Freq: Three times a day (TID) | ORAL | 0 refills | Status: DC | PRN

## 2018-05-24 DIAGNOSIS — Z85828 Personal history of other malignant neoplasm of skin: Secondary | ICD-10-CM | POA: Diagnosis not present

## 2018-05-24 DIAGNOSIS — D225 Melanocytic nevi of trunk: Secondary | ICD-10-CM | POA: Diagnosis not present

## 2018-05-24 DIAGNOSIS — C44629 Squamous cell carcinoma of skin of left upper limb, including shoulder: Secondary | ICD-10-CM | POA: Diagnosis not present

## 2018-05-24 DIAGNOSIS — D692 Other nonthrombocytopenic purpura: Secondary | ICD-10-CM | POA: Diagnosis not present

## 2018-05-24 DIAGNOSIS — L821 Other seborrheic keratosis: Secondary | ICD-10-CM | POA: Diagnosis not present

## 2018-05-24 DIAGNOSIS — D0462 Carcinoma in situ of skin of left upper limb, including shoulder: Secondary | ICD-10-CM | POA: Diagnosis not present

## 2018-06-04 ENCOUNTER — Other Ambulatory Visit: Payer: Self-pay

## 2018-06-04 DIAGNOSIS — L4050 Arthropathic psoriasis, unspecified: Secondary | ICD-10-CM

## 2018-06-04 MED ORDER — HYDROCODONE-ACETAMINOPHEN 7.5-325 MG PO TABS: 1.0000 | ORAL_TABLET | Freq: Three times a day (TID) | ORAL | 0 refills | Status: DC | PRN

## 2018-06-20 DIAGNOSIS — I25119 Atherosclerotic heart disease of native coronary artery with unspecified angina pectoris: Secondary | ICD-10-CM | POA: Diagnosis not present

## 2018-06-20 DIAGNOSIS — I10 Essential (primary) hypertension: Secondary | ICD-10-CM | POA: Diagnosis not present

## 2018-06-20 DIAGNOSIS — R0789 Other chest pain: Secondary | ICD-10-CM | POA: Diagnosis not present

## 2018-06-26 DIAGNOSIS — M48061 Spinal stenosis, lumbar region without neurogenic claudication: Secondary | ICD-10-CM | POA: Diagnosis not present

## 2018-06-26 DIAGNOSIS — M47816 Spondylosis without myelopathy or radiculopathy, lumbar region: Secondary | ICD-10-CM | POA: Diagnosis not present

## 2018-06-26 DIAGNOSIS — L4050 Arthropathic psoriasis, unspecified: Secondary | ICD-10-CM | POA: Diagnosis not present

## 2018-06-26 DIAGNOSIS — M5136 Other intervertebral disc degeneration, lumbar region: Secondary | ICD-10-CM | POA: Diagnosis not present

## 2018-07-23 DIAGNOSIS — M5136 Other intervertebral disc degeneration, lumbar region: Secondary | ICD-10-CM | POA: Diagnosis not present

## 2018-07-23 DIAGNOSIS — L4050 Arthropathic psoriasis, unspecified: Secondary | ICD-10-CM | POA: Diagnosis not present

## 2018-07-23 DIAGNOSIS — L409 Psoriasis, unspecified: Secondary | ICD-10-CM | POA: Diagnosis not present

## 2018-07-23 DIAGNOSIS — Z79899 Other long term (current) drug therapy: Secondary | ICD-10-CM | POA: Diagnosis not present

## 2018-08-03 ENCOUNTER — Other Ambulatory Visit: Payer: Self-pay | Admitting: Family Medicine

## 2018-09-24 DIAGNOSIS — R0789 Other chest pain: Secondary | ICD-10-CM | POA: Diagnosis not present

## 2018-09-24 DIAGNOSIS — I25119 Atherosclerotic heart disease of native coronary artery with unspecified angina pectoris: Secondary | ICD-10-CM | POA: Diagnosis not present

## 2018-09-24 DIAGNOSIS — I10 Essential (primary) hypertension: Secondary | ICD-10-CM | POA: Diagnosis not present

## 2018-10-01 DIAGNOSIS — D225 Melanocytic nevi of trunk: Secondary | ICD-10-CM | POA: Diagnosis not present

## 2018-10-01 DIAGNOSIS — L570 Actinic keratosis: Secondary | ICD-10-CM | POA: Diagnosis not present

## 2018-10-01 DIAGNOSIS — Z85828 Personal history of other malignant neoplasm of skin: Secondary | ICD-10-CM | POA: Diagnosis not present

## 2018-10-01 DIAGNOSIS — D692 Other nonthrombocytopenic purpura: Secondary | ICD-10-CM | POA: Diagnosis not present

## 2018-10-01 DIAGNOSIS — L821 Other seborrheic keratosis: Secondary | ICD-10-CM | POA: Diagnosis not present

## 2018-10-19 ENCOUNTER — Emergency Department: Payer: Medicare Other

## 2018-10-19 ENCOUNTER — Other Ambulatory Visit: Payer: Self-pay

## 2018-10-19 ENCOUNTER — Telehealth: Payer: Self-pay

## 2018-10-19 ENCOUNTER — Emergency Department
Admission: EM | Admit: 2018-10-19 | Discharge: 2018-10-19 | Disposition: A | Discharge status: Home / Self Care | Payer: Medicare Other | Attending: Student in an Organized Health Care Education/Training Program | Admitting: Student in an Organized Health Care Education/Training Program

## 2018-10-19 DIAGNOSIS — L539 Erythematous condition, unspecified: Secondary | ICD-10-CM | POA: Diagnosis not present

## 2018-10-19 DIAGNOSIS — I10 Essential (primary) hypertension: Secondary | ICD-10-CM | POA: Diagnosis not present

## 2018-10-19 DIAGNOSIS — L89899 Pressure ulcer of other site, unspecified stage: Secondary | ICD-10-CM | POA: Diagnosis not present

## 2018-10-19 DIAGNOSIS — L03116 Cellulitis of left lower limb: Secondary | ICD-10-CM | POA: Insufficient documentation

## 2018-10-19 DIAGNOSIS — R2242 Localized swelling, mass and lump, left lower limb: Secondary | ICD-10-CM | POA: Diagnosis present

## 2018-10-19 DIAGNOSIS — Z85828 Personal history of other malignant neoplasm of skin: Secondary | ICD-10-CM | POA: Insufficient documentation

## 2018-10-19 DIAGNOSIS — Z79899 Other long term (current) drug therapy: Secondary | ICD-10-CM | POA: Diagnosis not present

## 2018-10-19 DIAGNOSIS — S91302A Unspecified open wound, left foot, initial encounter: Secondary | ICD-10-CM | POA: Diagnosis not present

## 2018-10-19 DIAGNOSIS — M7989 Other specified soft tissue disorders: Secondary | ICD-10-CM | POA: Diagnosis not present

## 2018-10-19 LAB — CBC WITH DIFFERENTIAL/PLATELET
Abs Immature Granulocytes: 0.12 10*3/uL — ABNORMAL HIGH (ref 0.00–0.07)
Basophils Absolute: 0.1 10*3/uL (ref 0.0–0.1)
Basophils Relative: 1 %
Eosinophils Absolute: 0.1 10*3/uL (ref 0.0–0.5)
Eosinophils Relative: 1 %
HCT: 51.7 % (ref 39.0–52.0)
Hemoglobin: 17.8 g/dL — ABNORMAL HIGH (ref 13.0–17.0)
Immature Granulocytes: 1 %
Lymphocytes Relative: 15 %
Lymphs Abs: 1.6 10*3/uL (ref 0.7–4.0)
MCH: 31.9 pg (ref 26.0–34.0)
MCHC: 34.4 g/dL (ref 30.0–36.0)
MCV: 92.7 fL (ref 80.0–100.0)
Monocytes Absolute: 0.8 10*3/uL (ref 0.1–1.0)
Monocytes Relative: 8 %
Neutro Abs: 8.1 10*3/uL — ABNORMAL HIGH (ref 1.7–7.7)
Neutrophils Relative %: 74 %
Platelets: 514 10*3/uL — ABNORMAL HIGH (ref 150–400)
RBC: 5.58 MIL/uL (ref 4.22–5.81)
RDW: 15.9 % — ABNORMAL HIGH (ref 11.5–15.5)
WBC: 10.8 10*3/uL — ABNORMAL HIGH (ref 4.0–10.5)
nRBC: 0 % (ref 0.0–0.2)

## 2018-10-19 LAB — COMPREHENSIVE METABOLIC PANEL
ALT: 15 U/L (ref 0–44)
AST: 16 U/L (ref 15–41)
Albumin: 4.2 g/dL (ref 3.5–5.0)
Alkaline Phosphatase: 33 U/L — ABNORMAL LOW (ref 38–126)
Anion gap: 9 (ref 5–15)
BUN: 10 mg/dL (ref 8–23)
CO2: 29 mmol/L (ref 22–32)
Calcium: 9.1 mg/dL (ref 8.9–10.3)
Chloride: 97 mmol/L — ABNORMAL LOW (ref 98–111)
Creatinine, Ser: 1.01 mg/dL (ref 0.61–1.24)
GFR calc Af Amer: >60 mL/min (ref >60)
GFR calc non Af Amer: >60 mL/min (ref >60)
Glucose, Bld: 123 mg/dL — ABNORMAL HIGH (ref 70–99)
Potassium: 4.1 mmol/L (ref 3.5–5.1)
Sodium: 135 mmol/L (ref 135–145)
Total Bilirubin: 1.4 mg/dL — ABNORMAL HIGH (ref 0.3–1.2)
Total Protein: 6.4 g/dL — ABNORMAL LOW (ref 6.5–8.1)

## 2018-10-19 LAB — LACTIC ACID, PLASMA: Lactic Acid, Venous: 1.7 mmol/L (ref 0.5–1.9)

## 2018-10-19 MED ORDER — CEPHALEXIN 500 MG PO CAPS: 500.0000 mg | ORAL_CAPSULE | Freq: Three times a day (TID) | ORAL | 0 refills | Status: AC

## 2018-10-19 MED ORDER — CEPHALEXIN 500 MG PO CAPS
500.0000 mg | ORAL_CAPSULE | Freq: Once | ORAL | Status: AC
  Administered 2018-10-19: 500 mg via ORAL
  Filled 2018-10-19: qty 1

## 2018-10-19 MED ORDER — CLOTRIMAZOLE 1 % EX OINT: 1.0000 "application " | TOPICAL_OINTMENT | Freq: Two times a day (BID) | CUTANEOUS | 0 refills | Status: DC

## 2018-10-19 NOTE — ED Triage Notes (Signed)
Reports sore to left heel X weeks. Today he developed swelling and pain, yellow discharge to left heel and foot. Pt alert and oriented X4, cooperative, RR even and unlabored, color WNL. Pt in NAD.

## 2018-10-19 NOTE — Telephone Encounter (Signed)
Patient's wife calling that patient's left foot is swollen, feels warm, redness and it has a sore that is not infected. Patient's wife requested appointment for Tuesday. Scheduled patient at 8:40 am. Did advised patient wife that he probably needs to be seen sooner and she said they had stuff to do. advised to go to the urgent care or ED if worsen.

## 2018-10-19 NOTE — ED Notes (Addendum)
US tech at bedside

## 2018-10-19 NOTE — ED Provider Notes (Signed)
[facility] Emergency Department Provider Note    First MD Initiated Contact with Patient 10/19/18 2027     (approximate)  I have reviewed the triage vital signs and the nursing notes.   HISTORY  Chief Complaint Cellulitis    HPI Edward Mejia is a 73 y.o. male presents the ER for discomfort erythema and swelling to his left leg.  States he had a history of psoriasis and has had a history of cellulitic infections in his left foot.  Denies any fevers.  No significant pain.  There is a small wound that he noticed over the past few days and started having purulent drainage today.  Denies any nausea or vomiting.  He does not smoke.  Denies any history of diabetes.    Past Medical History:  Diagnosis Date   Arthritis    Cancer ([place])    skin cancer   Cellulitis    H/O adenomatous polyp of colon 03/24/2015   History of brain disorder: history of amaurosis fugax 03/24/2015   History of rheumatic fever 03/24/2015   DID Have Rheumatic Fever.     Hypertension    Peptic ulcer disease    Family History  Problem Relation Age of Onset   Cancer Mother        lung cancer   Heart disease Father 38       heart attack   Cancer Brother        pancreatic cancer   Cancer Brother        pancreatic cancer   Past Surgical History:  Procedure Laterality Date   APPENDECTOMY     LEFT HEART CATH AND CORONARY ANGIOGRAPHY Left 02/06/2018   Procedure: LEFT HEART CATH AND CORONARY ANGIOGRAPHY;  Surgeon: Teodoro Spray, MD;  Location: [facility];  Service: Cardiovascular;  Laterality: Left;   MANDIBLE FRACTURE SURGERY     fractured jaw   PARTIAL GASTRECTOMY     peptic ulcer disease   Patient Active Problem List   Diagnosis Date Noted   Recurrent cellulitis of lower extremity 04/04/2017   Bilateral lower extremity edema 04/04/2017   Bilateral lower extremity pain 04/04/2017   Hypertension 05/18/2016   Elevated blood pressure reading  04/19/2016   Hip pain 09/15/2015   Pain in the chest 03/27/2015   Arthritis 03/24/2015   History of GI bleed 03/24/2015   History of brain disorder: history of amaurosis fugax 03/24/2015   H/O adenomatous polyp of colon 03/24/2015   Immunosuppressed status ([place]) 03/24/2015   Psoriatic arthritis ([place]) 08/10/2011   History of pneumonia 04/04/2008   Herpes 03/26/2007   Dyslipidemia 11/25/2005   Hypercholesteremia 11/23/2005      Prior to Admission medications   Medication Sig Start Date End Date Taking? Authorizing Provider  acetaminophen (TYLENOL) 500 MG tablet Take 1,000 mg by mouth 3 (three) times daily as needed for moderate pain or headache.    [provider]  cephALEXin (KEFLEX) 500 MG capsule Take 1 capsule (500 mg total) by mouth 3 (three) times daily for 7 days. 10/19/18 10/26/18  Merlyn Lot, MD  Clotrimazole 1 % OINT Apply 1 application topically 2 (two) times daily. 10/19/18   Merlyn Lot, MD  cyclobenzaprine (FLEXERIL) 5 MG tablet Take 5 mg by mouth 3 (three) times daily as needed for muscle spasms.    [provider]  etanercept (ENBREL) 50 MG/ML injection Inject 50 mg into the skin every Thursday.     [provider]  folic acid (  FOLVITE) 1 MG tablet Take 2 mg by mouth daily with lunch.    [provider]  HYDROcodone-acetaminophen (NORCO) 7.5-325 MG tablet Take 1 tablet by mouth 3 (three) times daily as needed for moderate pain. 06/04/18   Birdie Sons, MD  methotrexate (RHEUMATREX) 2.5 MG tablet Take 12.5 mg by mouth every Saturday.    [provider]  misoprostol (CYTOTEC) 100 MCG tablet Take 100 mcg by mouth daily with lunch.  10/31/11   [provider]  nitroGLYCERIN (NITROSTAT) 0.4 MG SL tablet Place 0.4 mg under the tongue every 5 (five) minutes as needed for chest pain.    [provider]  predniSONE (DELTASONE) 5 MG tablet Take 7.5 mg by mouth daily with lunch.     [provider]  valsartan-hydrochlorothiazide (DIOVAN-HCT) 80-12.5 MG tablet Take 1 tablet by mouth daily. 08/03/18   Birdie Sons, MD  albuterol (PROVENTIL HFA;VENTOLIN HFA) 108 (90 Base) MCG/ACT inhaler Inhale 2 puffs into the lungs every 6 (six) hours as needed for wheezing. 05/01/17 05/09/17  Birdie Sons, MD    Allergies Pravachol  [pravastatin]    Social History Social History   Tobacco Use   Smoking status: Never Smoker   Smokeless tobacco: Never Used  Substance Use Topics   Alcohol use: No    Alcohol/week: 0.0 standard drinks   Drug use: No    Review of Systems Patient denies headaches, rhinorrhea, blurry vision, numbness, shortness of breath, chest pain, edema, cough, abdominal pain, nausea, vomiting, diarrhea, dysuria, fevers, rashes or hallucinations unless otherwise stated above in HPI. ____________________________________________   PHYSICAL EXAM:  VITAL SIGNS: Vitals:   10/19/18 1602  BP: 132/75  Pulse: 77  Resp: 18  Temp: 98.6 F (37 C)  SpO2: 95%    Constitutional: Alert and oriented.  Eyes: Conjunctivae are normal.  Head: Atraumatic. Nose: No congestion/rhinnorhea. Mouth/Throat: Mucous membranes are moist.   Neck: No stridor. Painless ROM.  Cardiovascular: Normal rate, regular rhythm. Grossly normal heart sounds.  Good peripheral circulation. Respiratory: Normal respiratory effort.  No retractions. Lungs CTAB. Gastrointestinal: Soft and nontender. No distention. No abdominal bruits. No CVA tenderness. Genitourinary:  Musculoskeletal: There 2 small subcentimeter areas of ulceration with some seropurulent drainage.  No crepitus.  There is also surrounding erythema and what appears to be tenia pedis type of abruption.  No streaky cellulitic changes up to the leg.  There is 2+ pitting edema.  Strong   DP and PT doppler signals to left leg.  Strong dp doppler on right,  Faint but present pt on right.  Cap refill 3seconds  No joint  effusions. Neurologic:  Normal speech and language. No gross focal neurologic deficits are appreciated. No facial droop Skin:  Skin is warm, dry and intact. No rash noted. Psychiatric: Mood and affect are normal. Speech and behavior are normal.  ____________________________________________   LABS (all labs ordered are listed, but only abnormal results are displayed)  Results for orders placed or performed during the hospital encounter of 10/19/18 (from the past 24 hour(s))  Lactic acid, plasma     Status: None   Collection Time: 10/19/18  4:08 PM  Result Value Ref Range   Lactic Acid, Venous 1.7 0.5 - 1.9 mmol/L  Comprehensive metabolic panel     Status: Abnormal   Collection Time: 10/19/18  4:08 PM  Result Value Ref Range   Sodium 135 135 - 145 mmol/L   Potassium 4.1 3.5 - 5.1 mmol/L   Chloride 97 (L) 98 -  111 mmol/L   CO2 29 22 - 32 mmol/L   Glucose, Bld 123 (H) 70 - 99 mg/dL   BUN 10 8 - 23 mg/dL   Creatinine, Ser 1.01 0.61 - 1.24 mg/dL   Calcium 9.1 8.9 - 10.3 mg/dL   Total Protein 6.4 (L) 6.5 - 8.1 g/dL   Albumin 4.2 3.5 - 5.0 g/dL   AST 16 15 - 41 U/L   ALT 15 0 - 44 U/L   Alkaline Phosphatase 33 (L) 38 - 126 U/L   Total Bilirubin 1.4 (H) 0.3 - 1.2 mg/dL   GFR calc non Af Amer >60 >60 mL/min   GFR calc Af Amer >60 >60 mL/min   Anion gap 9 5 - 15  CBC with Differential     Status: Abnormal   Collection Time: 10/19/18  4:08 PM  Result Value Ref Range   WBC 10.8 (H) 4.0 - 10.5 K/uL   RBC 5.58 4.22 - 5.81 MIL/uL   Hemoglobin 17.8 (H) 13.0 - 17.0 g/dL   HCT 51.7 39.0 - 52.0 %   MCV 92.7 80.0 - 100.0 fL   MCH 31.9 26.0 - 34.0 pg   MCHC 34.4 30.0 - 36.0 g/dL   RDW 15.9 (H) 11.5 - 15.5 %   Platelets 514 (H) 150 - 400 K/uL   nRBC 0.0 0.0 - 0.2 %   Neutrophils Relative % 74 %   Neutro Abs 8.1 (H) 1.7 - 7.7 K/uL   Lymphocytes Relative 15 %   Lymphs Abs 1.6 0.7 - 4.0 K/uL   Monocytes Relative 8 %   Monocytes Absolute 0.8 0.1 - 1.0 K/uL   Eosinophils Relative 1 %    Eosinophils Absolute 0.1 0.0 - 0.5 K/uL   Basophils Relative 1 %   Basophils Absolute 0.1 0.0 - 0.1 K/uL   Immature Granulocytes 1 %   Abs Immature Granulocytes 0.12 (H) 0.00 - 0.07 K/uL   ____________________________________________  EKG____________________________________________  RADIOLOGY  I personally reviewed all radiographic images ordered to evaluate for the above acute complaints and reviewed radiology reports and findings.  These findings were personally discussed with the patient.  Please see medical record for radiology report.  ____________________________________________   PROCEDURES  Procedure(s) performed:  Procedures    Critical Care performed: no ____________________________________________   INITIAL IMPRESSION / ASSESSMENT AND PLAN / ED COURSE  Pertinent labs & imaging results that were available during my care of the patient were reviewed by me and considered in my medical decision making (see chart for details).   DDX: cellulitis, dvt, tinea, ulcer  Averi Ruffolo is a 73 y.o. who presents to the ED with symptoms as described above.  Patient nontoxic afebrile hemodynamically stable.  Exam as above.  Does not that he has a complaint of cellulitic changes with the small ulcerations.  Ultrasound was ordered to exclude DVT.  X-ray shows no evidence of foreign body gas or osteolytic changes.  Given the ulceration will start on antibiotics.  Skin discoloration seem to be present for greater than 2 years after further conversation with family.  No significant worsening.  Denies any duskiness or discoloration that is changed in the past few days.  Do suspect some component of chronic peripheral vascular disease.  He has intact pulses at this time therefore do not feel that he needs hospitalization but will give referral to podiatry as well as vascular.  We discussed signs and symptoms for which she should return to the ER.    The patient was  evaluated in Emergency  Department today for the symptoms described in the history of present illness. He/she was evaluated in the context of the global COVID-19 pandemic, which necessitated consideration that the patient might be at risk for infection with the SARS-CoV-2 virus that causes COVID-19. Institutional protocols and algorithms that pertain to the evaluation of patients at risk for COVID-19 are in a state of rapid change based on information released by regulatory bodies including the CDC and federal and state organizations. These policies and algorithms were followed during the patient's care in the ED.  As part of my medical decision making, I reviewed the following data within the [facility] notes reviewed and incorporated, Labs reviewed, notes from prior ED visits and [place] Controlled Substance Database   ____________________________________________   FINAL CLINICAL IMPRESSION(S) / ED DIAGNOSES  Final diagnoses:  Cellulitis of left foot      NEW MEDICATIONS STARTED DURING THIS VISIT:  New Prescriptions   CEPHALEXIN (KEFLEX) 500 MG CAPSULE    Take 1 capsule (500 mg total) by mouth 3 (three) times daily for 7 days.   CLOTRIMAZOLE 1 % OINT    Apply 1 application topically 2 (two) times daily.     Note:  This document was prepared using Dragon voice recognition software and may include unintentional dictation errors.    Merlyn Lot, MD 10/19/18 2314

## 2018-10-23 ENCOUNTER — Other Ambulatory Visit: Payer: Self-pay

## 2018-10-23 ENCOUNTER — Ambulatory Visit (INDEPENDENT_AMBULATORY_CARE_PROVIDER_SITE_OTHER): Payer: Medicare Other | Admitting: Family Medicine

## 2018-10-23 ENCOUNTER — Encounter: Payer: Self-pay | Admitting: Family Medicine

## 2018-10-23 VITALS — BP 118/64 | HR 65 | Temp 96.8°F | Resp 16 | Wt 220.0 lb

## 2018-10-23 DIAGNOSIS — Z23 Encounter for immunization: Secondary | ICD-10-CM

## 2018-10-23 DIAGNOSIS — L03119 Cellulitis of unspecified part of limb: Secondary | ICD-10-CM | POA: Diagnosis not present

## 2018-10-23 DIAGNOSIS — Z125 Encounter for screening for malignant neoplasm of prostate: Secondary | ICD-10-CM

## 2018-10-23 NOTE — Progress Notes (Signed)
Patient: Edward Mejia Male    DOB: [birth year]   73 y.o.   MRN: [id number] Visit Date: 10/23/2018  Today's Provider: Lelon Huh, MD   Chief Complaint  Patient presents with  . Foot Swelling   Subjective:     HPI  Follow up ER visit  Patient was seen in ER for swelling and redness of left leg on 10/19/18. DVT was ruled out with venous ultrasound and xrays were performed showing no signs of osteomyelitis He was treated for cellulitis,tinea and ulcer. Treatment for this included prescribing Keflex and Clotrimazole. He reports excellent compliance with treatment. He reports this condition is Improved. Is still red with a few open wounds, but swelling has greatly improved.  ------------------------------------------------------------------------------------    Allergies  Allergen Reactions  . Pravachol  [Pravastatin]     Fatigue     Current Outpatient Medications:  .  acetaminophen (TYLENOL) 500 MG tablet, Take 1,000 mg by mouth 3 (three) times daily as needed for moderate pain or headache., Disp: , Rfl:  .  cephALEXin (KEFLEX) 500 MG capsule, Take 1 capsule (500 mg total) by mouth 3 (three) times daily for 7 days., Disp: 21 capsule, Rfl: 0 .  Clotrimazole 1 % OINT, Apply 1 application topically 2 (two) times daily., Disp: 56.7 g, Rfl: 0 .  cyclobenzaprine (FLEXERIL) 5 MG tablet, Take 5 mg by mouth 3 (three) times daily as needed for muscle spasms., Disp: , Rfl:  .  etanercept (ENBREL) 50 MG/ML injection, Inject 50 mg into the skin every Thursday. , Disp: , Rfl:  .  folic acid (FOLVITE) 1 MG tablet, Take 2 mg by mouth daily with lunch., Disp: , Rfl:  .  HYDROcodone-acetaminophen (NORCO) 7.5-325 MG tablet, Take 1 tablet by mouth 3 (three) times daily as needed for moderate pain., Disp: 90 tablet, Rfl: 0 .  methotrexate (RHEUMATREX) 2.5 MG tablet, Take 12.5 mg by mouth every Saturday., Disp: , Rfl:  .  misoprostol (CYTOTEC) 100 MCG tablet, Take 100 mcg by mouth  daily with lunch. , Disp: , Rfl:  .  nitroGLYCERIN (NITROSTAT) 0.4 MG SL tablet, Place 0.4 mg under the tongue every 5 (five) minutes as needed for chest pain., Disp: , Rfl:  .  predniSONE (DELTASONE) 5 MG tablet, Take 7.5 mg by mouth daily with lunch. , Disp: , Rfl:  .  valsartan-hydrochlorothiazide (DIOVAN-HCT) 80-12.5 MG tablet, Take 1 tablet by mouth daily., Disp: 90 tablet, Rfl: 4  Review of Systems  Constitutional: Negative for appetite change, chills and fever.  Respiratory: Negative for chest tightness, shortness of breath and wheezing.   Cardiovascular: Negative for chest pain and palpitations.  Gastrointestinal: Negative for abdominal pain, nausea and vomiting.    Social History   Tobacco Use  . Smoking status: Never Smoker  . Smokeless tobacco: Never Used  Substance Use Topics  . Alcohol use: No    Alcohol/week: 0.0 standard drinks      Objective:   BP 118/64   Pulse 65   Temp (!) 96.8 F (36 C) (Oral)   Resp 16   Wt 220 lb (99.8 kg)   SpO2 98%   BMI 29.84 kg/m  There is no height or weight on file to calculate BMI.   Physical Exam         Assessment & Plan    1. Recurrent cellulitis of lower extremity Improving on cephalexin. Continue current medications.   - Aerobic culture  2. Prostate cancer screening   3.  Need for influenza vaccination  - Flu Vaccine QUAD High Dose(Fluad)  The entirety of the information documented in the History of Present Illness, Review of Systems and Physical Exam were personally obtained by me. Portions of this information were initially documented by Minette Headland, CMA and reviewed by me for thoroughness and accuracy.      Lelon Huh, MD  [facility]

## 2018-10-24 DIAGNOSIS — L4050 Arthropathic psoriasis, unspecified: Secondary | ICD-10-CM | POA: Diagnosis not present

## 2018-10-24 DIAGNOSIS — M5136 Other intervertebral disc degeneration, lumbar region: Secondary | ICD-10-CM | POA: Diagnosis not present

## 2018-10-24 DIAGNOSIS — L409 Psoriasis, unspecified: Secondary | ICD-10-CM | POA: Diagnosis not present

## 2018-10-24 DIAGNOSIS — Z79899 Other long term (current) drug therapy: Secondary | ICD-10-CM | POA: Diagnosis not present

## 2018-10-26 LAB — AEROBIC CULTURE

## 2018-10-26 NOTE — Patient Instructions (Signed)
.   Please review the attached list of medications and notify my office if there are any errors.   . Please bring all of your medications to every appointment so we can make sure that our medication list is the same as yours.   . It is especially important to get the annual flu vaccine this year. If you haven't had it already, please go to your pharmacy or call the office as soon as possible to schedule you flu shot.  

## 2018-11-02 ENCOUNTER — Other Ambulatory Visit: Payer: Self-pay

## 2018-11-02 ENCOUNTER — Ambulatory Visit (INDEPENDENT_AMBULATORY_CARE_PROVIDER_SITE_OTHER): Payer: Medicare Other | Admitting: Family Medicine

## 2018-11-02 ENCOUNTER — Encounter: Payer: Self-pay | Admitting: Family Medicine

## 2018-11-02 VITALS — BP 120/72 | HR 66 | Temp 96.8°F | Resp 16 | Wt 226.0 lb

## 2018-11-02 DIAGNOSIS — L03119 Cellulitis of unspecified part of limb: Secondary | ICD-10-CM

## 2018-11-02 NOTE — Patient Instructions (Signed)
.   Please review the attached list of medications and notify my office if there are any errors.   . Please bring all of your medications to every appointment so we can make sure that our medication list is the same as yours.   . It is especially important to get the annual flu vaccine this year. If you haven't had it already, please go to your pharmacy or call the office as soon as possible to schedule you flu shot.  

## 2018-11-02 NOTE — Progress Notes (Signed)
Patient: Edward Mejia Male    DOB: 07/06/1945   73 y.o.   MRN: [id number] Visit Date: 11/02/2018  Today's Provider: Lelon Huh, MD   Chief Complaint  Patient presents with  . Follow-up   Subjective:     HPI  Follow up for Cellulitis:  The patient was last seen for this 10 days ago. Changes made at last visit include none; patient was advised to continue .  He reports good compliance with treatment. He feels that condition is Improved. He is not having side effects.   ------------------------------------------------------------------------------------  Allergies  Allergen Reactions  . Pravachol  [Pravastatin]     Fatigue     Current Outpatient Medications:  .  acetaminophen (TYLENOL) 500 MG tablet, Take 1,000 mg by mouth 3 (three) times daily as needed for moderate pain or headache., Disp: , Rfl:  .  aspirin EC 81 MG tablet, Take by mouth., Disp: , Rfl:  .  clobetasol (TEMOVATE) 0.05 % GEL, APP EXT AA BID, Disp: , Rfl:  .  Clotrimazole 1 % OINT, Apply 1 application topically 2 (two) times daily., Disp: 56.7 g, Rfl: 0 .  cyclobenzaprine (FLEXERIL) 5 MG tablet, Take 5 mg by mouth 3 (three) times daily as needed for muscle spasms., Disp: , Rfl:  .  etanercept (ENBREL) 50 MG/ML injection, Inject 50 mg into the skin every Thursday. , Disp: , Rfl:  .  folic acid (FOLVITE) 1 MG tablet, Take 2 mg by mouth daily with lunch., Disp: , Rfl:  .  gabapentin (NEURONTIN) 300 MG capsule, TK ONE C PO TID, Disp: , Rfl:  .  HYDROcodone-acetaminophen (NORCO) 7.5-325 MG tablet, Take 1 tablet by mouth 3 (three) times daily as needed for moderate pain., Disp: 90 tablet, Rfl: 0 .  isosorbide mononitrate (IMDUR) 120 MG 24 hr tablet, , Disp: , Rfl:  .  methotrexate (RHEUMATREX) 2.5 MG tablet, Take 12.5 mg by mouth every Saturday., Disp: , Rfl:  .  misoprostol (CYTOTEC) 100 MCG tablet, Take 100 mcg by mouth daily with lunch. , Disp: , Rfl:  .  nitroGLYCERIN (NITROSTAT) 0.4 MG SL  tablet, Place 0.4 mg under the tongue every 5 (five) minutes as needed for chest pain., Disp: , Rfl:  .  predniSONE (DELTASONE) 5 MG tablet, Take 7.5 mg by mouth daily with lunch. , Disp: , Rfl:  .  tiZANidine (ZANAFLEX) 2 MG tablet, , Disp: , Rfl:  .  valsartan-hydrochlorothiazide (DIOVAN-HCT) 80-12.5 MG tablet, Take 1 tablet by mouth daily., Disp: 90 tablet, Rfl: 4  Review of Systems  Constitutional: Negative for appetite change, chills and fever.  Respiratory: Negative for chest tightness, shortness of breath and wheezing.   Cardiovascular: Positive for leg swelling. Negative for chest pain and palpitations.  Gastrointestinal: Negative for abdominal pain, nausea and vomiting.    Social History   Tobacco Use  . Smoking status: Never Smoker  . Smokeless tobacco: Never Used  Substance Use Topics  . Alcohol use: No    Alcohol/week: 0.0 standard drinks      Objective:   BP 120/72 (BP Location: Right Arm, Patient Position: Sitting, Cuff Size: Normal)   Pulse 66   Temp (!) 96.8 F (36 C) (Temporal)   Resp 16   Wt 226 lb (102.5 kg)   SpO2 97% Comment: room air  BMI 30.65 kg/m  Vitals:   11/02/18 1112  BP: 120/72  Pulse: 66  Resp: 16  Temp: (!) 96.8 F (36 C)  TempSrc:  Temporal  SpO2: 97%  Weight: 226 lb (102.5 kg)  Body mass index is 30.65 kg/m.   Physical Exam  General appearance: alert, well developed, well nourished, cooperative and in no distress Head: Normocephalic, without obvious abnormality, atraumatic Respiratory: Respirations even and unlabored, normal respiratory rate Extremities: All extremities are intact.  Skin: Minimal swelling left lower leg and ankle, greatly improved. Small crusty healing lesion over lateral malleolus. Area is dull red and hairless with diminished pedal pulses. Not hot to touch.     Assessment & Plan    1. Recurrent cellulitis of lower extremity Greatly improved, does not appear actively infected, but exam is suggestive of  vascular disease. He had ABI with borderline ABI of left LE in 2018. Need to repeat for suspected PVD.   Marland KitchenThe entirety of the information documented in the History of Present Illness, Review of Systems and Physical Exam were personally obtained by me. Portions of this information were initially documented by Meyer Cory, CMA and reviewed by me for thoroughness and accuracy.      Lelon Huh, MD  [facility]

## 2018-11-07 ENCOUNTER — Other Ambulatory Visit: Payer: Self-pay

## 2018-11-13 ENCOUNTER — Telehealth: Payer: Self-pay | Admitting: Family Medicine

## 2018-11-13 NOTE — Telephone Encounter (Signed)
The ABI was normal, circulation is very good and is not responsible for recurrent infections in his foot.

## 2018-11-13 NOTE — Telephone Encounter (Signed)
Patient's wife advised

## 2018-11-13 NOTE — Telephone Encounter (Signed)
Pt's wife called saying her husband had an ABI test here on the 30th and they have not heard back from [place]  CB#  [phone]  [facility]

## 2019-01-07 NOTE — Progress Notes (Signed)
Subjective:   Edward Mejia is a 73 y.o. male who presents for Medicare Annual/Subsequent preventive examination.    This visit is being conducted through telemedicine due to the COVID-19 pandemic. This patient has given me verbal consent via doximity to conduct this visit, patient states they are participating from their home address. Some vital signs may be absent or patient reported.    Patient identification: identified by name, DOB, and current address  Review of Systems:  N/A  Cardiac Risk Factors include: advanced age (>69men, >14 women);hypertension;male gender;dyslipidemia     Objective:    Vitals: There were no vitals taken for this visit.  There is no height or weight on file to calculate BMI. Unable to obtain vitals due to visit being conducted via telephonically.   Advanced Directives 01/08/2019 10/19/2018 02/06/2018 12/14/2016  Does Patient Have a Medical Advance Directive? Yes No Yes Yes  Type of Paramedic of [place];Living will - Living will;Healthcare Power of Attorney Living will  Does patient want to make changes to medical advance directive? - - No - Patient declined -  Copy of [place] in Chart? No - copy requested - - -    Tobacco Social History   Tobacco Use  Smoking Status Former Smoker  Smokeless Tobacco Never Used  Tobacco Comment   quit over 40+ years ago     Counseling given: Not Answered Comment: quit over 40+ years ago   Clinical Intake:  Pre-visit preparation completed: Yes  Pain : No/denies pain Pain Score: 0-No pain     Diabetes: No  How often do you need to have someone help you when you read instructions, pamphlets, or other written materials from your doctor or pharmacy?: 1 - Never  Interpreter Needed?: No  Information entered by :: [facility], LPN  Past Medical History:  Diagnosis Date  . Arthritis   . Cancer ([place])    skin cancer  . Cellulitis   . Cellulitis   . H/O  adenomatous polyp of colon 03/24/2015  . History of brain disorder: history of amaurosis fugax 03/24/2015  . History of rheumatic fever 03/24/2015   DID Have Rheumatic Fever.    . Hypertension   . Peptic ulcer disease    Past Surgical History:  Procedure Laterality Date  . APPENDECTOMY    . LEFT HEART CATH AND CORONARY ANGIOGRAPHY Left 02/06/2018   Procedure: LEFT HEART CATH AND CORONARY ANGIOGRAPHY;  Surgeon: Teodoro Spray, MD;  Location: [place] [facility];  Service: Cardiovascular;  Laterality: Left;  Marland Kitchen MANDIBLE FRACTURE SURGERY     fractured jaw  . PARTIAL GASTRECTOMY     peptic ulcer disease   Family History  Problem Relation Age of Onset  . Cancer Mother        lung cancer  . Heart disease Father 20       heart attack  . Cancer Brother        pancreatic cancer  . Cancer Brother        pancreatic cancer   Social History   Socioeconomic History  . Marital status: Married    Spouse name: Lovey Newcomer   . Number of children: 4  . Years of education: Not on file  . Highest education level: 8th grade  Occupational History  . Occupation: Disabled    Comment: due to [place]  . Financial resource strain: Not hard at all  . Food insecurity    Worry: Never true  Inability: Never true  . Transportation needs    Medical: No    Non-medical: No  Tobacco Use  . Smoking status: Former Research scientist (life sciences)  . Smokeless tobacco: Never Used  . Tobacco comment: quit over 40+ years ago  Substance and Sexual Activity  . Alcohol use: No    Alcohol/week: 0.0 standard drinks  . Drug use: No  . Sexual activity: Not on file  Lifestyle  . Physical activity    Days per week: 0 days    Minutes per session: 0 min  . Stress: Not at all  Relationships  . Social Herbalist on phone: Patient refused    Gets together: Patient refused    Attends religious service: Patient refused    Active member of club or organization: Patient refused    Attends meetings of  clubs or organizations: Patient refused    Relationship status: Patient refused  Other Topics Concern  . Not on file  Social History Narrative  . Not on file    Outpatient Encounter Medications as of 01/08/2019  Medication Sig  . acetaminophen (TYLENOL) 500 MG tablet Take 1,000 mg by mouth 3 (three) times daily as needed for moderate pain or headache.  Marland Kitchen aspirin EC 81 MG tablet Take 81 mg by mouth daily.   . clobetasol (TEMOVATE) 0.05 % GEL As needed  . etanercept (ENBREL) 50 MG/ML injection Inject 50 mg into the skin every Thursday.   . folic acid (FOLVITE) 1 MG tablet Take 2 mg by mouth daily with lunch.  . gabapentin (NEURONTIN) 300 MG capsule Take 300 mg by mouth daily. @ 4 PM  . HYDROcodone-acetaminophen (NORCO) 7.5-325 MG tablet Take 1 tablet by mouth 3 (three) times daily as needed for moderate pain. (Patient taking differently: Take 1 tablet by mouth every 6 (six) hours as needed for moderate pain. )  . isosorbide mononitrate (IMDUR) 120 MG 24 hr tablet Take 120 mg by mouth daily.   . methotrexate (RHEUMATREX) 2.5 MG tablet Take 12.5 mg by mouth every Saturday.  . misoprostol (CYTOTEC) 100 MCG tablet Take 100 mcg by mouth daily with lunch.   . nitroGLYCERIN (NITROSTAT) 0.4 MG SL tablet Place 0.4 mg under the tongue every 5 (five) minutes as needed for chest pain.  . predniSONE (DELTASONE) 5 MG tablet Take 7.5 mg by mouth daily with lunch.   Marland Kitchen tiZANidine (ZANAFLEX) 2 MG tablet Take 2 mg by mouth 2 (two) times daily. As needed  . valsartan-hydrochlorothiazide (DIOVAN-HCT) 80-12.5 MG tablet Take 1 tablet by mouth daily.  . Clotrimazole 1 % OINT Apply 1 application topically 2 (two) times daily. (Patient not taking: Reported on 01/08/2019)  . cyclobenzaprine (FLEXERIL) 5 MG tablet Take 5 mg by mouth 3 (three) times daily as needed for muscle spasms.  . [DISCONTINUED] albuterol (PROVENTIL HFA;VENTOLIN HFA) 108 (90 Base) MCG/ACT inhaler Inhale 2 puffs into the lungs every 6 (six) hours as  needed for wheezing.   No facility-administered encounter medications on file as of 01/08/2019.     Activities of Daily Living In your present state of health, do you have any difficulty performing the following activities: 01/08/2019  Hearing? Y  Comment Difficulty hearing out of right ear.  Vision? N  Difficulty concentrating or making decisions? N  Walking or climbing stairs? N  Dressing or bathing? N  Doing errands, shopping? N  Preparing Food and eating ? N  Using the Toilet? N  In the past six months, have you accidently leaked urine?  N  Do you have problems with loss of bowel control? N  Managing your Medications? N  Managing your Finances? N  Housekeeping or managing your Housekeeping? N  Some recent data might be hidden    Patient Care Team: Birdie Sons, MD as PCP - General (Family Medicine) Lorelee Cover., MD as Consulting Physician (Ophthalmology) Quintin Alto, MD as Referring Physician (Rheumatology) Dorene Ar, MD (Pain Medicine) Teodoro Spray, MD as Consulting Physician (Cardiology)   Assessment:   This is a routine wellness examination for [place].  Exercise Activities and Dietary recommendations Current Exercise Habits: The patient does not participate in regular exercise at present, Exercise limited by: orthopedic condition(s)  Goals    . DIET - EAT MORE FRUITS AND VEGETABLES     Recommend to eat 2 servings of fruits and vegetables each day.     . Have 3 meals a day     Recommend eating 3 small meals every day with 2 healthy snacks in between.        Fall Risk: Fall Risk  01/08/2019 12/14/2016 08/25/2015  Falls in the past year? 0 No No  Number falls in past yr: 0 - -  Injury with Fall? 0 - -    FALL RISK PREVENTION PERTAINING TO THE HOME:  Any stairs in or around the home? Yes  If so, are there any without handrails? No   Home free of loose throw rugs in walkways, pet beds, electrical cords, etc? Yes  Adequate lighting in your home  to reduce risk of falls? Yes   ASSISTIVE DEVICES UTILIZED TO PREVENT FALLS:  Life alert? No  Use of a cane, walker or w/c? No  Grab bars in the bathroom? No  Shower chair or bench in shower? No  Elevated toilet seat or a handicapped toilet? No   TIMED UP AND GO:  Was the test performed? No .    Depression Screen PHQ 2/9 Scores 01/08/2019 12/14/2016 12/14/2016 08/25/2015  PHQ - 2 Score 0 0 0 0  PHQ- 9 Score - 3 - 3    Cognitive Function     6CIT Screen 01/08/2019  What Year? 0 points  What month? 0 points  What time? 0 points  Count back from 20 0 points  Months in reverse 0 points  Repeat phrase 0 points  Total Score 0    Immunization History  Administered Date(s) Administered  . DTaP 01/20/2005  . Fluad Quad(high Dose 65+) 10/23/2018  . Influenza, High Dose Seasonal PF 11/28/2013, 11/26/2015, 12/14/2016, 12/07/2017  . Influenza-Unspecified 11/15/2012  . Pneumococcal Conjugate-13 12/14/2016  . Pneumococcal Polysaccharide-23 01/16/2002, 10/31/2011    Qualifies for Shingles Vaccine? Yes . Due for Shingrix. Pt has been advised to call insurance company to determine out of pocket expense. Advised may also receive vaccine at local pharmacy or Health Dept. Verbalized acceptance and understanding.  Tdap: Although this vaccine is not a covered service during a Wellness Exam, does the patient still wish to receive this vaccine today?  No .   Flu Vaccine: Up to date  Pneumococcal Vaccine: Completed series  Screening Tests Health Maintenance  Topic Date Due  . COLONOSCOPY  02/11/2016  . TETANUS/TDAP  01/08/2020 (Originally 01/20/1965)  . INFLUENZA VACCINE  Completed  . Hepatitis C Screening  Completed  . PNA vac Low Risk Adult  Completed   Cancer Screenings:  Colorectal Screening: Completed 03/19/13. Repeat every 3 years. Declined referral today.   Lung Cancer Screening: (Low Dose CT Chest  recommended if Age 51-80 years, 78 pack-year currently smoking OR have quit w/in  15years.) does not qualify.   Additional Screening:  Hepatitis C Screening: Up to date  Vision Screening: Recommended annual ophthalmology exams for early detection of glaucoma and other disorders of the eye.  Dental Screening: Recommended annual dental exams for proper oral hygiene  Community Resource Referral:  CRR required this visit?  No        Plan:  I have personally reviewed and addressed the Medicare Annual Wellness questionnaire and have noted the following in the patient's chart:  A. Medical and social history B. Use of alcohol, tobacco or illicit drugs  C. Current medications and supplements D. Functional ability and status E.  Nutritional status F.  Physical activity G. Advance directives H. List of other physicians I.  Hospitalizations, surgeries, and ER visits in previous 12 months J.  [place] such as hearing and vision if needed, cognitive and depression L. Referrals and appointments   In addition, I have reviewed and discussed with patient certain preventive protocols, quality metrics, and best practice recommendations. A written personalized care plan for preventive services as well as general preventive health recommendations were provided to patient.   Glendora Score, [place]  [id number] Nurse Health Advisor   Nurse Notes: Pt declined a colonoscopy referral today.

## 2019-01-08 ENCOUNTER — Ambulatory Visit (INDEPENDENT_AMBULATORY_CARE_PROVIDER_SITE_OTHER): Payer: Medicare Other

## 2019-01-08 ENCOUNTER — Other Ambulatory Visit: Payer: Self-pay

## 2019-01-08 DIAGNOSIS — Z0000 Encounter for general adult medical examination without abnormal findings: Secondary | ICD-10-CM

## 2019-01-08 NOTE — Patient Instructions (Signed)
[name] , Thank you for taking time to come for your Medicare Wellness Visit. I appreciate your ongoing commitment to your health goals. Please review the following plan we discussed and let me know if I can assist you in the future.   Screening recommendations/referrals: Colonoscopy: Currently due, declined scheduling a colonoscopy at this time.  Recommended yearly ophthalmology/optometry visit for glaucoma screening and checkup Recommended yearly dental visit for hygiene and checkup  Vaccinations: Influenza vaccine: Up to date Pneumococcal vaccine: Completed series Tdap vaccine: Pt declines today.  Shingles vaccine: Pt declines today.     Advanced directives: Please bring a copy of your POA (Power of Attorney) and/or Living Will to your next appointment.   Conditions/risks identified: Recommend to eat 2 servings of fruits and vegetables each day.   Next appointment: 03/05/19 @ 2:00 PM with Dr Caryn Section. Declined scheduling an AWV for 2021 at this time.   Preventive Care 9 Years and Older, Male Preventive care refers to lifestyle choices and visits with your health care provider that can promote health and wellness. What does preventive care include?  A yearly physical exam. This is also called an annual well check.  Dental exams once or twice a year.  Routine eye exams. Ask your health care provider how often you should have your eyes checked.  Personal lifestyle choices, including:  Daily care of your teeth and gums.  Regular physical activity.  Eating a healthy diet.  Avoiding tobacco and drug use.  Limiting alcohol use.  Practicing safe sex.  Taking low doses of aspirin every day.  Taking vitamin and mineral supplements as recommended by your health care provider. What happens during an annual well check? The services and screenings done by your health care provider during your annual well check will depend on your age, overall health, lifestyle risk factors, and  family history of disease. Counseling  Your health care provider may ask you questions about your:  Alcohol use.  Tobacco use.  Drug use.  Emotional well-being.  Home and relationship well-being.  Sexual activity.  Eating habits.  History of falls.  Memory and ability to understand (cognition).  Work and work Statistician. Screening  You may have the following tests or measurements:  Height, weight, and BMI.  Blood pressure.  Lipid and cholesterol levels. These may be checked every 5 years, or more frequently if you are over 60 years old.  Skin check.  Lung cancer screening. You may have this screening every year starting at age 28 if you have a 30-pack-year history of smoking and currently smoke or have quit within the past 15 years.  Fecal occult blood test (FOBT) of the stool. You may have this test every year starting at age 70.  Flexible sigmoidoscopy or colonoscopy. You may have a sigmoidoscopy every 5 years or a colonoscopy every 10 years starting at age 55.  Prostate cancer screening. Recommendations will vary depending on your family history and other risks.  Hepatitis C blood test.  Hepatitis B blood test.  Sexually transmitted disease (STD) testing.  Diabetes screening. This is done by checking your blood sugar (glucose) after you have not eaten for a while (fasting). You may have this done every 1-3 years.  Abdominal aortic aneurysm (AAA) screening. You may need this if you are a current or former smoker.  Osteoporosis. You may be screened starting at age 14 if you are at high risk. Talk with your health care provider about your test results, treatment options, and if  necessary, the need for more tests. Vaccines  Your health care provider may recommend certain vaccines, such as:  Influenza vaccine. This is recommended every year.  Tetanus, diphtheria, and acellular pertussis (Tdap, Td) vaccine. You may need a Td booster every 10 years.  Zoster  vaccine. You may need this after age 24.  Pneumococcal 13-valent conjugate (PCV13) vaccine. One dose is recommended after age 27.  Pneumococcal polysaccharide (PPSV23) vaccine. One dose is recommended after age 23. Talk to your health care provider about which screenings and vaccines you need and how often you need them. This information is not intended to replace advice given to you by your health care provider. Make sure you discuss any questions you have with your health care provider. Document Released: 02/20/2015 Document Revised: 10/14/2015 Document Reviewed: 11/25/2014 Elsevier Interactive Patient Education  2017 [place] Prevention in the Home Falls can cause injuries. They can happen to people of all ages. There are many things you can do to make your home safe and to help prevent falls. What can I do on the outside of my home?  Regularly fix the edges of walkways and driveways and fix any cracks.  Remove anything that might make you trip as you walk through a door, such as a raised step or threshold.  Trim any bushes or trees on the path to your home.  Use bright outdoor lighting.  Clear any walking paths of anything that might make someone trip, such as rocks or tools.  Regularly check to see if handrails are loose or broken. Make sure that both sides of any steps have handrails.  Any raised decks and porches should have guardrails on the edges.  Have any leaves, snow, or ice cleared regularly.  Use sand or salt on walking paths during winter.  Clean up any spills in your garage right away. This includes oil or grease spills. What can I do in the bathroom?  Use night lights.  Install grab bars by the toilet and in the tub and shower. Do not use towel bars as grab bars.  Use non-skid mats or decals in the tub or shower.  If you need to sit down in the shower, use a plastic, non-slip stool.  Keep the floor dry. Clean up any water that spills on the  floor as soon as it happens.  Remove soap buildup in the tub or shower regularly.  Attach bath mats securely with double-sided non-slip rug tape.  Do not have throw rugs and other things on the floor that can make you trip. What can I do in the bedroom?  Use night lights.  Make sure that you have a light by your bed that is easy to reach.  Do not use any sheets or blankets that are too big for your bed. They should not hang down onto the floor.  Have a firm chair that has side arms. You can use this for support while you get dressed.  Do not have throw rugs and other things on the floor that can make you trip. What can I do in the kitchen?  Clean up any spills right away.  Avoid walking on wet floors.  Keep items that you use a lot in easy-to-reach places.  If you need to reach something above you, use a strong step stool that has a grab bar.  Keep electrical cords out of the way.  Do not use floor polish or wax that makes floors slippery. If you must  use wax, use non-skid floor wax.  Do not have throw rugs and other things on the floor that can make you trip. What can I do with my stairs?  Do not leave any items on the stairs.  Make sure that there are handrails on both sides of the stairs and use them. Fix handrails that are broken or loose. Make sure that handrails are as long as the stairways.  Check any carpeting to make sure that it is firmly attached to the stairs. Fix any carpet that is loose or worn.  Avoid having throw rugs at the top or bottom of the stairs. If you do have throw rugs, attach them to the floor with carpet tape.  Make sure that you have a light switch at the top of the stairs and the bottom of the stairs. If you do not have them, ask someone to add them for you. What else can I do to help prevent falls?  Wear shoes that:  Do not have high heels.  Have rubber bottoms.  Are comfortable and fit you well.  Are closed at the toe. Do not wear  sandals.  If you use a stepladder:  Make sure that it is fully opened. Do not climb a closed stepladder.  Make sure that both sides of the stepladder are locked into place.  Ask someone to hold it for you, if possible.  Clearly mark and make sure that you can see:  Any grab bars or handrails.  First and last steps.  Where the edge of each step is.  Use tools that help you move around (mobility aids) if they are needed. These include:  Canes.  Walkers.  Scooters.  Crutches.  Turn on the lights when you go into a dark area. Replace any light bulbs as soon as they burn out.  Set up your furniture so you have a clear path. Avoid moving your furniture around.  If any of your floors are uneven, fix them.  If there are any pets around you, be aware of where they are.  Review your medicines with your doctor. Some medicines can make you feel dizzy. This can increase your chance of falling. Ask your doctor what other things that you can do to help prevent falls. This information is not intended to replace advice given to you by your health care provider. Make sure you discuss any questions you have with your health care provider. Document Released: 11/20/2008 Document Revised: 07/02/2015 Document Reviewed: 02/28/2014 Elsevier Interactive Patient Education  2017 [employer].

## 2019-01-21 DIAGNOSIS — L409 Psoriasis, unspecified: Secondary | ICD-10-CM | POA: Diagnosis not present

## 2019-01-21 DIAGNOSIS — L4050 Arthropathic psoriasis, unspecified: Secondary | ICD-10-CM | POA: Diagnosis not present

## 2019-01-21 DIAGNOSIS — Z79899 Other long term (current) drug therapy: Secondary | ICD-10-CM | POA: Diagnosis not present

## 2019-01-21 DIAGNOSIS — R5382 Chronic fatigue, unspecified: Secondary | ICD-10-CM | POA: Diagnosis not present

## 2019-01-21 DIAGNOSIS — M47816 Spondylosis without myelopathy or radiculopathy, lumbar region: Secondary | ICD-10-CM | POA: Diagnosis not present

## 2019-02-21 ENCOUNTER — Ambulatory Visit: Payer: Self-pay

## 2019-02-21 ENCOUNTER — Ambulatory Visit: Payer: Self-pay | Admitting: *Deleted

## 2019-02-21 NOTE — Telephone Encounter (Signed)
  Reason for Disposition . [1] MODERATE weakness (i.e., interferes with work, school, normal activities) AND [2] persists > 3 days  Answer Assessment - Initial Assessment Questions 1. DESCRIPTION: "Describe how you are feeling."     Weakness at times x 1 month 2. SEVERITY: "How bad is it?"  "Can you stand and walk?"   - MILD - Feels weak or tired, but does not interfere with work, school or normal activities   - [place] to stand and walk; weakness interferes with work, school, or normal activities   - SEVERE - Unable to stand or walk     Moderate 3. ONSET:  "When did the weakness begin?"     1 month 4. CAUSE: "What do you think is causing the weakness?"     Unsure 5. MEDICINES: "Have you recently started a new medicine or had a change in the amount of a medicine?"     No new medicines 6. OTHER SYMPTOMS: "Do you have any other symptoms?" (e.g., chest pain, fever, cough, SOB, vomiting, diarrhea, bleeding, other areas of pain)     Will have chills on right side only 7. PREGNANCY: "Is there any chance you are pregnant?" "When was your last menstrual period?"     No  Protocols used: WEAKNESS (GENERALIZED) AND FATIGUE-A-AH

## 2019-02-21 NOTE — Telephone Encounter (Signed)
Wife is calling - she has concerns about patient's BP and symptoms. Call dropped during transfer- triaged by another nurse.

## 2019-02-21 NOTE — Telephone Encounter (Signed)
Wife and pt. Report he has episodes x 1 month of having chills on the right side of his body for a few minutes and then will feel weak afterwards. Seems to happening "more often." BP today 158/86. Denies any weakness to right side or tingling. Denies any shortness of breath or chest pain. Appointment made for tomorrow.Spoke with Jiles Garter in the practice who agrees that this appropriate.Instructed pt. To call 911 if symptoms worsen, verbalizes understanding.

## 2019-02-21 NOTE — Telephone Encounter (Signed)
Wife calling about pt. Call dropped. Called back, no answer.

## 2019-02-22 ENCOUNTER — Ambulatory Visit (INDEPENDENT_AMBULATORY_CARE_PROVIDER_SITE_OTHER): Payer: Medicare Other | Admitting: Family Medicine

## 2019-02-22 ENCOUNTER — Encounter: Payer: Self-pay | Admitting: Family Medicine

## 2019-02-22 ENCOUNTER — Other Ambulatory Visit: Payer: Self-pay

## 2019-02-22 VITALS — BP 151/83 | HR 67 | Temp 96.9°F | Ht 72.0 in | Wt 220.0 lb

## 2019-02-22 DIAGNOSIS — Z79899 Other long term (current) drug therapy: Secondary | ICD-10-CM

## 2019-02-22 DIAGNOSIS — I10 Essential (primary) hypertension: Secondary | ICD-10-CM

## 2019-02-22 DIAGNOSIS — R232 Flushing: Secondary | ICD-10-CM

## 2019-02-22 MED ORDER — VALSARTAN-HYDROCHLOROTHIAZIDE 160-12.5 MG PO TABS: 1.0000 | ORAL_TABLET | Freq: Every day | ORAL | 3 refills | Status: DC

## 2019-02-22 NOTE — Progress Notes (Signed)
Patient: Edward Mejia Male    DOB: February 21, 1945   74 y.o.   MRN: [id number] Visit Date: 02/23/2019  Today's Provider: Lavon Paganini, MD   Chief Complaint  Patient presents with  . Hypertension  . Hot Flashes   Subjective:     HPI   Pt having weakness on right side, pt first noticed it a few months ago. Has spells where he gets weak all at once, clammy and starts having chills on right side and sometimes has a headache. Usually has them every now and then, yesterday had a weak spell twice in one hour. Has chest pains this morning and recently had a heart catherterization. Yesterday BPs were 156/86 and 152/81.   Initially feels like he is going to get dizzy and the weakness creeps down.  As it starts to pass, chills run down his right side.  Occasional flushing episodes.  No chest pain or shortness of breath is associated with these episodes.  They only last for a few minutes.  They seem to be getting more frequent.  He has not discussed these with any of his other doctors that he is seeing.   Allergies  Allergen Reactions  . Pravachol  [Pravastatin]     Fatigue     Current Outpatient Medications:  .  acetaminophen (TYLENOL) 500 MG tablet, Take 1,000 mg by mouth 3 (three) times daily as needed for moderate pain or headache., Disp: , Rfl:  .  HYDROcodone-acetaminophen (NORCO) 7.5-325 MG tablet, Take 1 tablet by mouth 3 (three) times daily as needed for moderate pain. (Patient taking differently: Take 1 tablet by mouth every 6 (six) hours as needed for moderate pain. ), Disp: 90 tablet, Rfl: 0 .  isosorbide mononitrate (IMDUR) 120 MG 24 hr tablet, Take 120 mg by mouth daily. , Disp: , Rfl:  .  misoprostol (CYTOTEC) 100 MCG tablet, Take 100 mcg by mouth daily with lunch. , Disp: , Rfl:  .  nitroGLYCERIN (NITROSTAT) 0.4 MG SL tablet, Place 0.4 mg under the tongue every 5 (five) minutes as needed for chest pain., Disp: , Rfl:  .  predniSONE (DELTASONE) 5 MG tablet, Take 7.5  mg by mouth daily with lunch. , Disp: , Rfl:  .  tiZANidine (ZANAFLEX) 2 MG tablet, Take 2 mg by mouth 2 (two) times daily. As needed, Disp: , Rfl:  .  clobetasol (TEMOVATE) 0.05 % GEL, As needed, Disp: , Rfl:  .  Clotrimazole 1 % OINT, Apply 1 application topically 2 (two) times daily. (Patient not taking: Reported on 01/08/2019), Disp: 56.7 g, Rfl: 0 .  cyclobenzaprine (FLEXERIL) 5 MG tablet, Take 5 mg by mouth 3 (three) times daily as needed for muscle spasms., Disp: , Rfl:  .  etanercept (ENBREL) 50 MG/ML injection, Inject 50 mg into the skin every Thursday. , Disp: , Rfl:  .  folic acid (FOLVITE) 1 MG tablet, Take 2 mg by mouth daily with lunch., Disp: , Rfl:  .  gabapentin (NEURONTIN) 300 MG capsule, Take 300 mg by mouth daily. @ 4 PM, Disp: , Rfl:  .  methotrexate (RHEUMATREX) 2.5 MG tablet, Take 12.5 mg by mouth every Saturday., Disp: , Rfl:  .  valsartan-hydrochlorothiazide (DIOVAN-HCT) 160-12.5 MG tablet, Take 1 tablet by mouth daily., Disp: 30 tablet, Rfl: 3  Review of Systems  Constitutional: Positive for chills and diaphoresis. Negative for activity change, appetite change, fatigue, fever and unexpected weight change.  HENT: Negative.   Eyes: Negative.   Respiratory:  Negative.   Cardiovascular: Negative.   Gastrointestinal: Negative.   Endocrine: Negative.   Genitourinary: Negative.   Skin: Negative.   Neurological: Positive for dizziness, weakness and light-headedness. Negative for tremors, seizures, syncope, facial asymmetry, speech difficulty, numbness and headaches.  Psychiatric/Behavioral: Negative.     Social History   Tobacco Use  . Smoking status: Former Research scientist (life sciences)  . Smokeless tobacco: Never Used  . Tobacco comment: quit over 40+ years ago  Substance Use Topics  . Alcohol use: No    Alcohol/week: 0.0 standard drinks      Objective:   BP (!) 151/83 (BP Location: Right Arm, Patient Position: Sitting, Cuff Size: Large)   Pulse 67   Temp (!) 96.9 F (36.1 C)  (Temporal)   Ht 6' (1.829 m)   Wt 220 lb (99.8 kg)   BMI 29.84 kg/m  Vitals:   02/22/19 0947  BP: (!) 151/83  Pulse: 67  Temp: (!) 96.9 F (36.1 C)  TempSrc: Temporal  Weight: 220 lb (99.8 kg)  Height: 6' (1.829 m)  Body mass index is 29.84 kg/m.   Physical Exam Vitals reviewed.  Constitutional:      General: He is not in acute distress.    Appearance: Normal appearance. He is not diaphoretic.  HENT:     Head: Normocephalic and atraumatic.  Eyes:     General: No scleral icterus.    Conjunctiva/sclera: Conjunctivae normal.  Cardiovascular:     Rate and Rhythm: Normal rate and regular rhythm.     Heart sounds: Normal heart sounds. No murmur.  Pulmonary:     Effort: Pulmonary effort is normal. No respiratory distress.     Breath sounds: Normal breath sounds. No wheezing or rhonchi.  Abdominal:     General: There is no distension.     Palpations: Abdomen is soft.     Tenderness: There is no abdominal tenderness.  Musculoskeletal:     Cervical back: Neck supple.     Right lower leg: No edema.     Left lower leg: No edema.  Lymphadenopathy:     Cervical: No cervical adenopathy.  Skin:    General: Skin is warm and dry.     Findings: No rash.  Neurological:     Mental Status: He is alert and oriented to person, place, and time. Mental status is at baseline.     Cranial Nerves: No cranial nerve deficit.     Sensory: No sensory deficit.     Motor: No weakness.     Coordination: Coordination normal.     Deep Tendon Reflexes: Reflexes normal.  Psychiatric:        Mood and Affect: Mood normal.        Behavior: Behavior normal.      Results for orders placed or performed in visit on 02/22/19  CBC  Result Value Ref Range   WBC 10.0 3.4 - 10.8 x10E3/uL   RBC 6.01 (H) 4.14 - 5.80 x10E6/uL   Hemoglobin 18.6 (H) 13.0 - 17.7 g/dL   Hematocrit 53.9 (H) 37.5 - 51.0 %   MCV 90 79 - 97 fL   MCH 30.9 26.6 - 33.0 pg   MCHC 34.5 31.5 - 35.7 g/dL   RDW 14.5 11.6 - 15.4 %    Platelets 435 150 - 450 x10E3/uL  TSH  Result Value Ref Range   TSH 4.790 (H) 0.450 - 4.500 uIU/mL  B12 and Folate Panel  Result Value Ref Range   Vitamin B-12 266 232 - 1,245 pg/mL  Folate 14.7 >3.0 ng/mL  CMP (Comprehensive metabolic panel)  Result Value Ref Range   Glucose 86 65 - 99 mg/dL   BUN 9 8 - 27 mg/dL   Creatinine, Ser 1.01 0.76 - 1.27 mg/dL   GFR calc non Af Amer 73 >59 mL/min/1.73   GFR calc Af Amer 84 >59 mL/min/1.73   BUN/Creatinine Ratio 9 (L) 10 - 24   Sodium 134 134 - 144 mmol/L   Potassium 5.0 3.5 - 5.2 mmol/L   Chloride 94 (L) 96 - 106 mmol/L   CO2 24 20 - 29 mmol/L   Calcium 9.5 8.6 - 10.2 mg/dL   Total Protein 6.1 6.0 - 8.5 g/dL   Albumin 4.2 3.7 - 4.7 g/dL   Globulin, Total 1.9 1.5 - 4.5 g/dL   Albumin/Globulin Ratio 2.2 1.2 - 2.2   Bilirubin Total 1.2 0.0 - 1.2 mg/dL   Alkaline Phosphatase 49 39 - 117 IU/L   AST 19 0 - 40 IU/L   ALT 19 0 - 44 IU/L       Assessment & Plan    1. Flushing -Ongoing issue, that seems to be getting more frequent -Patient is on chronic prednisone, which could be contributing -The constellation of symptoms with flushing, lightheadedness, elevated blood pressure, diaphoresis, is concerning for possible prednisone side effects -She is completely neuro intact today with no asymmetric weakness on exam -He does not have any chest pain or shortness of breath associated with these episodes and they are short-lived and do not come with exertion, so doubt cardiac etiology, but he does have follow-up with his cardiologist upcoming soon and will discuss the symptoms at that time -Check labs to see if there is any underlying pathology that could be contributing to this -Follow-up with PCP in 2 weeks to reassess - CBC - TSH - B12 and Folate Panel - CMP (Comprehensive metabolic panel)  2. Long-term use of high-risk medication -On chronic prednisone, check labs as above - CBC - TSH - B12 and Folate Panel - CMP (Comprehensive  metabolic panel)  3. Essential hypertension -Uncontrolled with elevation on home readings as well -Increase valsartan-HCTZ from 80-12.5 mg daily to 160-12.5 mg daily -Discussed hypotension and fall precautions -Recheck metabolic panel -Follow-up in 2 weeks with PCP to reassess   Meds ordered this encounter  Medications  . valsartan-hydrochlorothiazide (DIOVAN-HCT) 160-12.5 MG tablet    Sig: Take 1 tablet by mouth daily.    Dispense:  30 tablet    Refill:  3      The entirety of the information documented in the History of Present Illness, Review of Systems and Physical Exam were personally obtained by me. Portions of this information were initially documented by Ashley Royalty, CMA and reviewed by me for thoroughness and accuracy.    Jaice Lague, Dionne Bucy, MD MPH [facility]

## 2019-02-23 ENCOUNTER — Encounter: Payer: Self-pay | Admitting: Family Medicine

## 2019-02-23 LAB — COMPREHENSIVE METABOLIC PANEL
ALT: 19 IU/L (ref 0–44)
AST: 19 IU/L (ref 0–40)
Albumin/Globulin Ratio: 2.2 (ref 1.2–2.2)
Albumin: 4.2 g/dL (ref 3.7–4.7)
Alkaline Phosphatase: 49 IU/L (ref 39–117)
BUN/Creatinine Ratio: 9 — ABNORMAL LOW (ref 10–24)
BUN: 9 mg/dL (ref 8–27)
Bilirubin Total: 1.2 mg/dL (ref 0.0–1.2)
CO2: 24 mmol/L (ref 20–29)
Calcium: 9.5 mg/dL (ref 8.6–10.2)
Chloride: 94 mmol/L — ABNORMAL LOW (ref 96–106)
Creatinine, Ser: 1.01 mg/dL (ref 0.76–1.27)
GFR calc Af Amer: 84 mL/min/{1.73_m2} (ref >59)
GFR calc non Af Amer: 73 mL/min/{1.73_m2} (ref >59)
Globulin, Total: 1.9 g/dL (ref 1.5–4.5)
Glucose: 86 mg/dL (ref 65–99)
Potassium: 5 mmol/L (ref 3.5–5.2)
Sodium: 134 mmol/L (ref 134–144)
Total Protein: 6.1 g/dL (ref 6.0–8.5)

## 2019-02-23 LAB — CBC
Hematocrit: 53.9 % — ABNORMAL HIGH (ref 37.5–51.0)
Hemoglobin: 18.6 g/dL — ABNORMAL HIGH (ref 13.0–17.7)
MCH: 30.9 pg (ref 26.6–33.0)
MCHC: 34.5 g/dL (ref 31.5–35.7)
MCV: 90 fL (ref 79–97)
Platelets: 435 10*3/uL (ref 150–450)
RBC: 6.01 x10E6/uL — ABNORMAL HIGH (ref 4.14–5.80)
RDW: 14.5 % (ref 11.6–15.4)
WBC: 10 10*3/uL (ref 3.4–10.8)

## 2019-02-23 LAB — TSH: TSH: 4.79 u[IU]/mL — ABNORMAL HIGH (ref 0.450–4.500)

## 2019-02-23 LAB — B12 AND FOLATE PANEL
Folate: 14.7 ng/mL (ref >3.0)
Vitamin B-12: 266 pg/mL (ref 232–1245)

## 2019-02-25 ENCOUNTER — Telehealth: Payer: Self-pay

## 2019-02-25 ENCOUNTER — Telehealth: Payer: Self-pay | Admitting: Family Medicine

## 2019-02-25 NOTE — Telephone Encounter (Signed)
Called Patient left message fior a return call back  To receive lab results

## 2019-02-25 NOTE — Telephone Encounter (Signed)
-----   Message from Virginia Crews, MD sent at 02/25/2019  1:05 PM EST ----- Normal/stable labs, except TSH is slightly high.  Please add on free T4 to confirm.

## 2019-02-25 NOTE — Telephone Encounter (Signed)
Copied from [place]. Topic: Quick Communication - Lab Results (Clinic Use ONLY) >> Feb 25, 2019  1:36 PM Wallace, Leonard wrote: Called patient to inform them of  lab results. When patient returns call, triage nurse may disclose results.

## 2019-02-25 NOTE — Telephone Encounter (Signed)
Patient's wife Edward Mejia  advised as below. Per DPR

## 2019-02-25 NOTE — Telephone Encounter (Signed)
[place], [place] may give result. T4 free added.

## 2019-02-28 LAB — T4, FREE

## 2019-02-28 LAB — SPECIMEN STATUS REPORT

## 2019-03-01 ENCOUNTER — Telehealth: Payer: Self-pay

## 2019-03-01 NOTE — Telephone Encounter (Signed)
Patient and wife advised as below. They will go over labs with Dr. Caryn Section on 03/05/2019.

## 2019-03-01 NOTE — Telephone Encounter (Signed)
-----   Message from Virginia Crews, MD sent at 03/01/2019  8:45 AM EST ----- Unclear why free T4 was not performed and was cancelled. At this point, patient probably has to have it re-drawn.

## 2019-03-05 ENCOUNTER — Encounter: Payer: Medicare Other | Admitting: Family Medicine

## 2019-03-06 ENCOUNTER — Other Ambulatory Visit: Payer: Self-pay

## 2019-03-06 ENCOUNTER — Encounter: Payer: Self-pay | Admitting: Physician Assistant

## 2019-03-06 ENCOUNTER — Ambulatory Visit (INDEPENDENT_AMBULATORY_CARE_PROVIDER_SITE_OTHER): Payer: Medicare Other | Admitting: Physician Assistant

## 2019-03-06 VITALS — BP 153/77 | HR 72 | Temp 98.2°F | Wt 218.2 lb

## 2019-03-06 DIAGNOSIS — L03119 Cellulitis of unspecified part of limb: Secondary | ICD-10-CM

## 2019-03-06 MED ORDER — CEPHALEXIN 500 MG PO CAPS: 500.0000 mg | ORAL_CAPSULE | Freq: Three times a day (TID) | ORAL | 0 refills | Status: AC

## 2019-03-06 NOTE — Patient Instructions (Signed)

## 2019-03-06 NOTE — Progress Notes (Signed)
Patient: Edward Mejia Male    DOB: 1945-03-26   74 y.o.   MRN: [id number] Visit Date: 03/06/2019  Today's Provider: Trinna Post, PA-C   Chief Complaint  Patient presents with  . Leg Pain   Subjective:   Patient reports a history of left leg swelling and redness x 1 day. He reports his left foot is typically blue and purple but most recently it has developed a redness. He has been treated for recurrent cellulitis, most recently in 10/2018 and reports he improved with a course of keflex 500 mg TID x 7 days. He was seen in the ER where they were able to get DP and PT pulses. He has had ABIs done in this clinic which were normal.    Leg Pain  The incident occurred 6 to 12 hours ago. There was no injury mechanism. The pain is present in the left foot and left ankle. The pain is at a severity of 0/10. The patient is experiencing no pain. Pertinent negatives include no inability to bear weight, loss of motion, numbness or tingling. He reports no foreign bodies present. Nothing aggravates the symptoms. He has tried nothing for the symptoms. The treatment provided no relief.    Allergies  Allergen Reactions  . Pravachol  [Pravastatin]     Fatigue     Current Outpatient Medications:  .  acetaminophen (TYLENOL) 500 MG tablet, Take 1,000 mg by mouth 3 (three) times daily as needed for moderate pain or headache., Disp: , Rfl:  .  clobetasol (TEMOVATE) 0.05 % GEL, As needed, Disp: , Rfl:  .  Clotrimazole 1 % OINT, Apply 1 application topically 2 (two) times daily., Disp: 56.7 g, Rfl: 0 .  cyclobenzaprine (FLEXERIL) 5 MG tablet, Take 5 mg by mouth 3 (three) times daily as needed for muscle spasms., Disp: , Rfl:  .  etanercept (ENBREL) 50 MG/ML injection, Inject 50 mg into the skin every Thursday. , Disp: , Rfl:  .  folic acid (FOLVITE) 1 MG tablet, Take 2 mg by mouth daily with lunch., Disp: , Rfl:  .  gabapentin (NEURONTIN) 300 MG capsule, Take 300 mg by mouth daily. @ 4 PM, Disp:  , Rfl:  .  HYDROcodone-acetaminophen (NORCO) 7.5-325 MG tablet, Take 1 tablet by mouth 3 (three) times daily as needed for moderate pain. (Patient taking differently: Take 1 tablet by mouth every 6 (six) hours as needed for moderate pain. ), Disp: 90 tablet, Rfl: 0 .  isosorbide mononitrate (IMDUR) 120 MG 24 hr tablet, Take 120 mg by mouth daily. , Disp: , Rfl:  .  methotrexate (RHEUMATREX) 2.5 MG tablet, Take 12.5 mg by mouth every Saturday., Disp: , Rfl:  .  misoprostol (CYTOTEC) 100 MCG tablet, Take 100 mcg by mouth daily with lunch. , Disp: , Rfl:  .  nitroGLYCERIN (NITROSTAT) 0.4 MG SL tablet, Place 0.4 mg under the tongue every 5 (five) minutes as needed for chest pain., Disp: , Rfl:  .  predniSONE (DELTASONE) 5 MG tablet, Take 7.5 mg by mouth daily with lunch. , Disp: , Rfl:  .  tiZANidine (ZANAFLEX) 2 MG tablet, Take 2 mg by mouth 2 (two) times daily. As needed, Disp: , Rfl:  .  valsartan-hydrochlorothiazide (DIOVAN-HCT) 160-12.5 MG tablet, Take 1 tablet by mouth daily., Disp: 30 tablet, Rfl: 3  Review of Systems  Neurological: Negative for tingling and numbness.    Social History   Tobacco Use  . Smoking status: Former Research scientist (life sciences)  .  Smokeless tobacco: Never Used  . Tobacco comment: quit over 40+ years ago  Substance Use Topics  . Alcohol use: No    Alcohol/week: 0.0 standard drinks      Objective:   BP (!) 153/77 (BP Location: Left Arm, Patient Position: Sitting, Cuff Size: Normal)   Pulse 72   Temp 98.2 F (36.8 C) (Temporal)   Wt 218 lb 3.2 oz (99 kg)   BMI 29.59 kg/m  Vitals:   03/06/19 1022  BP: (!) 153/77  Pulse: 72  Temp: 98.2 F (36.8 C)  TempSrc: Temporal  Weight: 218 lb 3.2 oz (99 kg)  Body mass index is 29.59 kg/m.   Physical Exam Musculoskeletal:     Right lower leg: No edema.     Left lower leg: No edema.     Comments: LLE edema with bluish discoloration of foot that extends into ankle. There is some superimposed redness on his left ankle.   Skin:     General: Skin is warm and dry.     Capillary Refill: Capillary refill takes 2 to 3 seconds.  Neurological:     Mental Status: He is alert and oriented to person, place, and time. Mental status is at baseline.  Psychiatric:        Mood and Affect: Mood normal.        Behavior: Behavior normal.     Media Information   Document Information  Photos  Left foot   03/06/2019 10:37  Attached To:  Office Visit on 03/06/19 with Trinna Post, PA-C  Source Information  Paulene Floor  [place]  Left foot   03/06/2019 10:37  Attached To:  Office Visit on 03/06/19 with Trinna Post, PA-C  Source Information  Carles Collet M, PA-C  [facility]     No results found for any visits on 03/06/19.     Assessment & Plan    1. Recurrent cellulitis of lower extremity  - cephALEXin (KEFLEX) 500 MG capsule; Take 1 capsule (500 mg total) by mouth 3 (three) times daily for 7 days.  Dispense: 21 capsule; Refill: 0  The entirety of the information documented in the History of Present Illness, Review of Systems and Physical Exam were personally obtained by me. Portions of this information were initially documented by [facility] and reviewed by me for thoroughness and accuracy.   Follow up PRN    Trinna Post, PA-C  [place]

## 2019-03-11 DIAGNOSIS — I2699 Other pulmonary embolism without acute cor pulmonale: Secondary | ICD-10-CM

## 2019-03-11 DIAGNOSIS — I82409 Acute embolism and thrombosis of unspecified deep veins of unspecified lower extremity: Secondary | ICD-10-CM

## 2019-03-11 HISTORY — DX: Other pulmonary embolism without acute cor pulmonale: I26.99

## 2019-03-11 HISTORY — DX: Acute embolism and thrombosis of unspecified deep veins of unspecified lower extremity: I82.409

## 2019-03-18 DIAGNOSIS — I2510 Atherosclerotic heart disease of native coronary artery without angina pectoris: Secondary | ICD-10-CM | POA: Diagnosis not present

## 2019-03-18 DIAGNOSIS — I25119 Atherosclerotic heart disease of native coronary artery with unspecified angina pectoris: Secondary | ICD-10-CM | POA: Diagnosis not present

## 2019-03-18 DIAGNOSIS — I10 Essential (primary) hypertension: Secondary | ICD-10-CM | POA: Diagnosis not present

## 2019-03-22 DIAGNOSIS — M7918 Myalgia, other site: Secondary | ICD-10-CM | POA: Diagnosis not present

## 2019-03-22 DIAGNOSIS — M47816 Spondylosis without myelopathy or radiculopathy, lumbar region: Secondary | ICD-10-CM | POA: Diagnosis not present

## 2019-03-22 DIAGNOSIS — M4722 Other spondylosis with radiculopathy, cervical region: Secondary | ICD-10-CM | POA: Diagnosis not present

## 2019-03-22 DIAGNOSIS — M5136 Other intervertebral disc degeneration, lumbar region: Secondary | ICD-10-CM | POA: Diagnosis not present

## 2019-03-27 ENCOUNTER — Ambulatory Visit: Payer: Medicare Other | Attending: Internal Medicine

## 2019-03-27 DIAGNOSIS — Z20822 Contact with and (suspected) exposure to covid-19: Secondary | ICD-10-CM

## 2019-03-28 ENCOUNTER — Telehealth: Payer: Self-pay | Admitting: Physician Assistant

## 2019-03-28 ENCOUNTER — Other Ambulatory Visit: Payer: Self-pay | Admitting: Physician Assistant

## 2019-03-28 DIAGNOSIS — L4050 Arthropathic psoriasis, unspecified: Secondary | ICD-10-CM

## 2019-03-28 DIAGNOSIS — I10 Essential (primary) hypertension: Secondary | ICD-10-CM

## 2019-03-28 DIAGNOSIS — U071 COVID-19: Secondary | ICD-10-CM

## 2019-03-28 DIAGNOSIS — D849 Immunodeficiency, unspecified: Secondary | ICD-10-CM

## 2019-03-28 LAB — NOVEL CORONAVIRUS, NAA: SARS-CoV-2, NAA: DETECTED — AB

## 2019-03-28 NOTE — Progress Notes (Signed)
  I connected by phone with Edward Mejia on 03/28/2019 at 1:35 PM to discuss the potential use of an new treatment for mild to moderate COVID-19 viral infection in non-hospitalized patients.  This patient is a 74 y.o. male that meets the FDA criteria for Emergency Use Authorization of bamlanivimab or casirivimab\imdevimab.  Has a (+) direct SARS-CoV-2 viral test result  Has mild or moderate COVID-19   Is ? 74 years of age and weighs ? 40 kg  Is NOT hospitalized due to COVID-19  Is NOT requiring oxygen therapy or requiring an increase in baseline oxygen flow rate due to COVID-19  Is within 10 days of symptom onset  Has at least one of the high risk factor(s) for progression to severe COVID-19 and/or hospitalization as defined in EUA.  Specific high risk criteria : >/= 75 yo, HTN, psoriatic arthritis on prednisone and MTX.    I have spoken and communicated the following to the patient or parent/caregiver:  1. FDA has authorized the emergency use of bamlanivimab and casirivimab\imdevimab for the treatment of mild to moderate COVID-19 in adults and pediatric patients with positive results of direct SARS-CoV-2 viral testing who are 18 years of age and older weighing at least 40 kg, and who are at high risk for progressing to severe COVID-19 and/or hospitalization.  2. The significant known and potential risks and benefits of bamlanivimab and casirivimab\imdevimab, and the extent to which such potential risks and benefits are unknown.  3. Information on available alternative treatments and the risks and benefits of those alternatives, including clinical trials.  4. Patients treated with bamlanivimab and casirivimab\imdevimab should continue to self-isolate and use infection control measures (e.g., wear mask, isolate, social distance, avoid sharing personal items, clean and disinfect "high touch" surfaces, and frequent handwashing) according to CDC guidelines.   5. The patient or  parent/caregiver has the option to accept or refuse bamlanivimab or casirivimab\imdevimab .  After reviewing this information with the patient, The patient agreed to proceed with receiving the bamlanimivab infusion and will be provided a copy of the Fact sheet prior to receiving the infusion.   Pt is set up for tomorrow 03/29/19 @ 2:30pm for infusion. Sx onset 03/28/19.    Angelena Form PA-C 03/28/2019 1:35 PM

## 2019-03-28 NOTE — Telephone Encounter (Signed)
I connected by phone with Lyn Henri on 03/28/2019 at 1:35 PM to discuss the potential use of an new treatment for mild to moderate COVID-19 viral infection in non-hospitalized patients.  This patient is a 74 y.o. male that meets the FDA criteria for Emergency Use Authorization of bamlanivimab or casirivimab\imdevimab.  Has a (+) direct SARS-CoV-2 viral test result  Has mild or moderate COVID-19   Is ?74 years of age and weighs ?40 kg  Is NOT hospitalized due to COVID-19  Is NOT requiring oxygen therapy or requiring an increase in baseline oxygen flow rate due to COVID-19  Is within 10 days of symptom onset  Has at least one of the high risk factor(s)for progression to severe COVID-19 and/or hospitalization as defined in EUA. ? Specific high risk criteria : >/= 75 yo, HTN, psoriatic arthritis on prednisone and MTX.    I have spoken and communicated the following to the patient or parent/caregiver:  1. FDA has authorized the emergency use of bamlanivimab and casirivimab\imdevimab for the treatment of mild to moderate COVID-19 in adults and pediatric patients with positive results of direct SARS-CoV-2 viral testing who are 50 years of age and older weighing at least 40 kg, and who are at high risk for progressing to severe COVID-19 and/or hospitalization.  2. The significant known and potential risks and benefits of bamlanivimab and casirivimab\imdevimab, and the extent to which such potential risks and benefits are unknown.  3. Information on available alternative treatments and the risks and benefits of those alternatives, including clinical trials.  4. Patients treated with bamlanivimab and casirivimab\imdevimab should continue to self-isolate and use infection control measures (e.g., wear mask, isolate, social distance, avoid sharing personal items, clean and disinfect "high touch" surfaces, and frequent handwashing) according to CDC guidelines.   5. The patient or  parent/caregiver has the option to accept or refuse bamlanivimab or casirivimab\imdevimab .  After reviewing this information with the patient, The patient agreed to proceed with receiving the bamlanimivab infusion and will be provided a copy of the Fact sheet prior to receiving the infusion.   Pt is set up for tomorrow 03/29/19 @ 2:30pm for infusion. Sx onset 03/28/19. Directions given. Wife had infusion yesterday.    Angelena Form PA-C 03/28/2019 1:35 PM

## 2019-03-29 ENCOUNTER — Encounter (HOSPITAL_COMMUNITY): Payer: Self-pay

## 2019-03-29 ENCOUNTER — Ambulatory Visit (HOSPITAL_COMMUNITY)
Admission: RE | Admit: 2019-03-29 | Discharge: 2019-03-29 | Disposition: A | Discharge status: Home / Self Care | Payer: Medicare Other | Source: Ambulatory Visit | Attending: Pulmonary Disease | Admitting: Pulmonary Disease

## 2019-03-29 ENCOUNTER — Ambulatory Visit (HOSPITAL_COMMUNITY): Payer: Medicare Other

## 2019-03-29 DIAGNOSIS — U071 COVID-19: Secondary | ICD-10-CM | POA: Insufficient documentation

## 2019-03-29 DIAGNOSIS — Z23 Encounter for immunization: Secondary | ICD-10-CM | POA: Diagnosis not present

## 2019-03-29 DIAGNOSIS — I10 Essential (primary) hypertension: Secondary | ICD-10-CM | POA: Insufficient documentation

## 2019-03-29 DIAGNOSIS — L4050 Arthropathic psoriasis, unspecified: Secondary | ICD-10-CM | POA: Insufficient documentation

## 2019-03-29 DIAGNOSIS — D849 Immunodeficiency, unspecified: Secondary | ICD-10-CM | POA: Diagnosis present

## 2019-03-29 MED ORDER — SODIUM CHLORIDE 0.9 % IV SOLN
700.0000 mg | Freq: Once | INTRAVENOUS | Status: AC
  Administered 2019-03-29: 700 mg via INTRAVENOUS
  Filled 2019-03-29: qty 20

## 2019-03-29 MED ORDER — FAMOTIDINE IN NACL 20-0.9 MG/50ML-% IV SOLN: 20.0000 mg | Freq: Once | INTRAVENOUS | Status: DC | PRN

## 2019-03-29 MED ORDER — SODIUM CHLORIDE 0.9 % IV SOLN: INTRAVENOUS | Status: DC | PRN

## 2019-03-29 MED ORDER — METHYLPREDNISOLONE SODIUM SUCC 125 MG IJ SOLR: 125.0000 mg | Freq: Once | INTRAMUSCULAR | Status: DC | PRN

## 2019-03-29 MED ORDER — DIPHENHYDRAMINE HCL 50 MG/ML IJ SOLN: 50.0000 mg | Freq: Once | INTRAMUSCULAR | Status: DC | PRN

## 2019-03-29 MED ORDER — EPINEPHRINE 0.3 MG/0.3ML IJ SOAJ: 0.3000 mg | Freq: Once | INTRAMUSCULAR | Status: DC | PRN

## 2019-03-29 MED ORDER — ALBUTEROL SULFATE HFA 108 (90 BASE) MCG/ACT IN AERS: 2.0000 | INHALATION_SPRAY | Freq: Once | RESPIRATORY_TRACT | Status: DC | PRN

## 2019-03-29 NOTE — Discharge Instructions (Signed)

## 2019-03-29 NOTE — Progress Notes (Signed)
Patient ID: Edward Mejia, male   DOB: 05-10-1945, 74 y.o.   MRN: [id number]  Diagnosis: [id number]   Procedure: Cov[facility] Infusion Clinic Med: bamlanivimab infusion - Provided patient with bamlanimivab fact sheet for patients, parents and caregivers prior to infusion.  Complications: No immediate complications noted.  Discharge: Discharged home   Heide Scales 03/29/2019

## 2019-04-02 ENCOUNTER — Emergency Department: Payer: Medicare Other

## 2019-04-02 ENCOUNTER — Other Ambulatory Visit: Payer: Self-pay

## 2019-04-02 ENCOUNTER — Encounter: Payer: Self-pay | Admitting: Emergency Medicine

## 2019-04-02 ENCOUNTER — Emergency Department
Admission: EM | Admit: 2019-04-02 | Discharge: 2019-04-02 | Disposition: A | Discharge status: Home / Self Care | Payer: Medicare Other | Attending: Emergency Medicine | Admitting: Emergency Medicine

## 2019-04-02 DIAGNOSIS — J189 Pneumonia, unspecified organism: Secondary | ICD-10-CM | POA: Diagnosis not present

## 2019-04-02 DIAGNOSIS — Z79899 Other long term (current) drug therapy: Secondary | ICD-10-CM | POA: Insufficient documentation

## 2019-04-02 DIAGNOSIS — I10 Essential (primary) hypertension: Secondary | ICD-10-CM | POA: Insufficient documentation

## 2019-04-02 DIAGNOSIS — Z87891 Personal history of nicotine dependence: Secondary | ICD-10-CM | POA: Diagnosis not present

## 2019-04-02 DIAGNOSIS — Z85828 Personal history of other malignant neoplasm of skin: Secondary | ICD-10-CM | POA: Diagnosis not present

## 2019-04-02 DIAGNOSIS — U071 COVID-19: Secondary | ICD-10-CM | POA: Insufficient documentation

## 2019-04-02 DIAGNOSIS — E860 Dehydration: Secondary | ICD-10-CM | POA: Insufficient documentation

## 2019-04-02 LAB — COMPREHENSIVE METABOLIC PANEL
ALT: 23 U/L (ref 0–44)
AST: 25 U/L (ref 15–41)
Albumin: 3.3 g/dL — ABNORMAL LOW (ref 3.5–5.0)
Alkaline Phosphatase: 44 U/L (ref 38–126)
Anion gap: 6 (ref 5–15)
BUN: 15 mg/dL (ref 8–23)
CO2: 27 mmol/L (ref 22–32)
Calcium: 8.3 mg/dL — ABNORMAL LOW (ref 8.9–10.3)
Chloride: 96 mmol/L — ABNORMAL LOW (ref 98–111)
Creatinine, Ser: 1.12 mg/dL (ref 0.61–1.24)
GFR calc Af Amer: >60 mL/min (ref >60)
GFR calc non Af Amer: >60 mL/min (ref >60)
Glucose, Bld: 117 mg/dL — ABNORMAL HIGH (ref 70–99)
Potassium: 4.3 mmol/L (ref 3.5–5.1)
Sodium: 129 mmol/L — ABNORMAL LOW (ref 135–145)
Total Bilirubin: 1.7 mg/dL — ABNORMAL HIGH (ref 0.3–1.2)
Total Protein: 6.1 g/dL — ABNORMAL LOW (ref 6.5–8.1)

## 2019-04-02 LAB — CBC WITH DIFFERENTIAL/PLATELET
Abs Immature Granulocytes: 0.3 10*3/uL — ABNORMAL HIGH (ref 0.00–0.07)
Basophils Absolute: 0 10*3/uL (ref 0.0–0.1)
Basophils Relative: 1 %
Eosinophils Absolute: 0 10*3/uL (ref 0.0–0.5)
Eosinophils Relative: 0 %
HCT: 55.5 % — ABNORMAL HIGH (ref 39.0–52.0)
Hemoglobin: 18.8 g/dL — ABNORMAL HIGH (ref 13.0–17.0)
Immature Granulocytes: 5 %
Lymphocytes Relative: 18 %
Lymphs Abs: 1.1 10*3/uL (ref 0.7–4.0)
MCH: 28.4 pg (ref 26.0–34.0)
MCHC: 33.9 g/dL (ref 30.0–36.0)
MCV: 83.7 fL (ref 80.0–100.0)
Monocytes Absolute: 0.9 10*3/uL (ref 0.1–1.0)
Monocytes Relative: 15 %
Neutro Abs: 3.7 10*3/uL (ref 1.7–7.7)
Neutrophils Relative %: 61 %
Platelets: 347 10*3/uL (ref 150–400)
RBC: 6.63 MIL/uL — ABNORMAL HIGH (ref 4.22–5.81)
RDW: 16.4 % — ABNORMAL HIGH (ref 11.5–15.5)
WBC: 6 10*3/uL (ref 4.0–10.5)
nRBC: 0 % (ref 0.0–0.2)

## 2019-04-02 MED ORDER — SODIUM CHLORIDE 0.9 % IV BOLUS
1000.0000 mL | Freq: Once | INTRAVENOUS | Status: AC
  Administered 2019-04-02: 1000 mL via INTRAVENOUS

## 2019-04-02 NOTE — ED Triage Notes (Signed)
Pt in via POV, reports being Covid+ w/ complaints of worsening fatigue, generalized weakness, loss of appetite.  Pt A/Ox4, vitals WDL, NAD noted at this time.

## 2019-04-02 NOTE — ED Notes (Signed)
Pt ambulated with oxygen sats at 95-96% on room air.  Pt states I feel better   No chest pain  Pt alert.

## 2019-04-02 NOTE — Discharge Instructions (Addendum)
Please seek medical attention for any high fevers, chest pain, shortness of breath, change in behavior, persistent vomiting, bloody stool or any other new or concerning symptoms.  

## 2019-04-02 NOTE — ED Notes (Signed)
Pt reports he has covid and is more sob and feels week.  No chest pain.  Nonsmoker.  No n/v/d.  Dx with covid last week and went for an infusion last week.  Pt alert  Speech clear.  Iv placed.  nsr on monitor.

## 2019-04-02 NOTE — ED Notes (Signed)
Full rainbow sent to lab per this RN.

## 2019-04-02 NOTE — ED Provider Notes (Signed)
[facility] Emergency Department Provider Note  ____________________________________________   I have reviewed the triage vital signs and the nursing notes.   HISTORY  Chief Complaint Covid+   History limited by: Not Limited   HPI Edward Mejia is a 74 y.o. male who presents to the emergency department today because of concern for worsening symptoms related to covid. The patient states he was diagnosed with covid last week. Received antibody infusion on Thursday. Felt okay the next day but over the weekend has noticed increased weakness. Says he is having a hard time walking from his bed to the bathroom. Has also noticed decreased oral intake. Denies change in taste but has had decreased appetite. Denies any chest pain.   Records reviewed. Per medical record review patient has a history of recent covid diagnosis, received antibody infusion a few days ago.   Past Medical History:  Diagnosis Date  . Arthritis   . Cancer ([place])    skin cancer  . Cellulitis   . Cellulitis   . H/O adenomatous polyp of colon 03/24/2015  . History of brain disorder: history of amaurosis fugax 03/24/2015  . History of rheumatic fever 03/24/2015   DID Have Rheumatic Fever.    . Hypertension   . Peptic ulcer disease     Patient Active Problem List   Diagnosis Date Noted  . Recurrent cellulitis of lower extremity 04/04/2017  . Bilateral lower extremity edema 04/04/2017  . Bilateral lower extremity pain 04/04/2017  . Hypertension 05/18/2016  . Elevated blood pressure reading 04/19/2016  . Hip pain 09/15/2015  . Pain in the chest 03/27/2015  . Arthritis 03/24/2015  . History of GI bleed 03/24/2015  . History of brain disorder: history of amaurosis fugax 03/24/2015  . H/O adenomatous polyp of colon 03/24/2015  . Immunosuppressed status ([place]) 03/24/2015  . Psoriatic arthritis ([place]) 08/10/2011  . History of pneumonia 04/04/2008  . Herpes 03/26/2007  . Dyslipidemia  11/25/2005  . Hypercholesteremia 11/23/2005    Past Surgical History:  Procedure Laterality Date  . APPENDECTOMY    . LEFT HEART CATH AND CORONARY ANGIOGRAPHY Left 02/06/2018   Procedure: LEFT HEART CATH AND CORONARY ANGIOGRAPHY;  Surgeon: Teodoro Spray, MD;  Location: [facility];  Service: Cardiovascular;  Laterality: Left;  Marland Kitchen MANDIBLE FRACTURE SURGERY     fractured jaw  . PARTIAL GASTRECTOMY     peptic ulcer disease    Prior to Admission medications   Medication Sig Start Date End Date Taking? Authorizing Provider  acetaminophen (TYLENOL) 500 MG tablet Take 1,000 mg by mouth 3 (three) times daily as needed for moderate pain or headache.    [provider]  clobetasol (TEMOVATE) 0.05 % GEL As needed 07/23/18   [provider]  Clotrimazole 1 % OINT Apply 1 application topically 2 (two) times daily. 10/19/18   Merlyn Lot, MD  cyclobenzaprine (FLEXERIL) 5 MG tablet Take 5 mg by mouth 3 (three) times daily as needed for muscle spasms.    [provider]  etanercept (ENBREL) 50 MG/ML injection Inject 50 mg into the skin every Thursday.     [provider]  folic acid (FOLVITE) 1 MG tablet Take 2 mg by mouth daily with lunch.    [provider]  gabapentin (NEURONTIN) 300 MG capsule Take 300 mg by mouth daily. @ 4 PM 10/02/18   [provider]  HYDROcodone-acetaminophen (NORCO) 7.5-325 MG tablet Take 1 tablet by mouth 3 (three) times daily as needed for moderate  pain. Patient taking differently: Take 1 tablet by mouth every 6 (six) hours as needed for moderate pain.  06/04/18   Birdie Sons, MD  isosorbide mononitrate (IMDUR) 120 MG 24 hr tablet Take 120 mg by mouth daily.  09/24/18   [provider]  methotrexate (RHEUMATREX) 2.5 MG tablet Take 12.5 mg by mouth every Saturday.    [provider]  misoprostol (CYTOTEC) 100 MCG tablet Take 100 mcg by mouth daily with lunch.  10/31/11   [provider]  nitroGLYCERIN (NITROSTAT) 0.4 MG SL tablet Place 0.4 mg under the tongue every 5 (five) minutes as needed for chest pain.    [provider]  predniSONE (DELTASONE) 5 MG tablet Take 7.5 mg by mouth daily with lunch.     [provider]  tiZANidine (ZANAFLEX) 2 MG tablet Take 2 mg by mouth 2 (two) times daily. As needed 10/12/18   [provider]  valsartan-hydrochlorothiazide (DIOVAN-HCT) 160-12.5 MG tablet Take 1 tablet by mouth daily. 02/22/19   Virginia Crews, MD  albuterol (PROVENTIL HFA;VENTOLIN HFA) 108 (90 Base) MCG/ACT inhaler Inhale 2 puffs into the lungs every 6 (six) hours as needed for wheezing. 05/01/17 05/09/17  Birdie Sons, MD    Allergies Pravachol [pravastatin]  Family History  Problem Relation Age of Onset  . Cancer Mother        lung cancer  . Heart disease Father 81       heart attack  . Cancer Brother        pancreatic cancer  . Cancer Brother        pancreatic cancer    Social History Social History   Tobacco Use  . Smoking status: Former Research scientist (life sciences)  . Smokeless tobacco: Never Used  . Tobacco comment: quit over 40+ years ago  Substance Use Topics  . Alcohol use: No    Alcohol/week: 0.0 standard drinks  . Drug use: No    Review of Systems Constitutional: Positive for generalized weakness.  Eyes: No visual changes. ENT: No sore throat. Cardiovascular: Denies chest pain. Respiratory: Positive for shortness of breath. Gastrointestinal: No abdominal pain.  Positive for decreased appetite.    Genitourinary: Negative for dysuria. Musculoskeletal: Negative for back pain. Skin: Negative for rash. Neurological: Negative for headaches, focal weakness or numbness.  ____________________________________________   PHYSICAL EXAM:  VITAL SIGNS: ED Triage Vitals  Enc Vitals Group     BP 04/02/19 1729 (!) 147/77     Pulse Rate 04/02/19 1729 80     Resp 04/02/19 1729 16     Temp 04/02/19 1729 97.9 F (36.6 C)      Temp Source 04/02/19 1729 Oral     SpO2 04/02/19 1729 94 %     Weight 04/02/19 1730 220 lb (99.8 kg)     Height 04/02/19 1730 6' (1.829 m)     Head Circumference --      Peak Flow --      Pain Score 04/02/19 1730 7   Constitutional: Alert and oriented.  Eyes: Conjunctivae are normal.  ENT      Head: Normocephalic and atraumatic.      Nose: No congestion/rhinnorhea.      Mouth/Throat: Mucous membranes are moist.      Neck: No stridor. Hematological/Lymphatic/Immunilogical: No cervical lymphadenopathy. Cardiovascular: Normal rate, regular rhythm.  No murmurs, rubs, or gallops.  Respiratory: Normal respiratory effort without tachypnea nor retractions. Breath sounds are clear and equal bilaterally. No wheezes/rales/rhonchi. Gastrointestinal: Soft and non tender. No  rebound. No guarding.  Genitourinary: Deferred Musculoskeletal: Normal range of motion in all extremities. No lower extremity edema. Neurologic:  Normal speech and language. No gross focal neurologic deficits are appreciated.  Skin:  Skin is warm, dry and intact. No rash noted. Psychiatric: Mood and affect are normal. Speech and behavior are normal. Patient exhibits appropriate insight and judgment.  ____________________________________________    LABS (pertinent positives/negatives)  CMP na 129, cl 96, glu 117, cr 1.12 CBC wbc 6.0, hgb 18.8, plt 347  ____________________________________________   EKG  None  ____________________________________________    RADIOLOGY  CXR Consistent with covid  ____________________________________________   PROCEDURES  Procedures  ____________________________________________   INITIAL IMPRESSION / ASSESSMENT AND PLAN / ED COURSE  Pertinent labs & imaging results that were available during my care of the patient were reviewed by me and considered in my medical decision making (see chart for details).   Patient presented to the emergency department today because of  concern for weakness in relationship to recent covid diagnosis. Patients blood work here is consistent with dehydration. Was given IVFs. Did feel better after IVFs. No desaturation of oxygen with ambulation. Has already been to antibody clinic. Will plan on discharging.   ____________________________________________   FINAL CLINICAL IMPRESSION(S) / ED DIAGNOSES  Final diagnoses:  COVID-19  Dehydration     Note: This dictation was prepared with Dragon dictation. Any transcriptional errors that result from this process are unintentional     Nance Pear, MD 04/02/19 2306

## 2019-04-02 NOTE — ED Notes (Signed)
Pt ambulates to br.

## 2019-04-03 ENCOUNTER — Other Ambulatory Visit: Payer: Self-pay

## 2019-04-03 ENCOUNTER — Inpatient Hospital Stay (HOSPITAL_COMMUNITY)
Admission: EM | Admit: 2019-04-03 | Discharge: 2019-04-07 | DRG: 177 | Disposition: A | Discharge status: Home Health | Payer: Medicare Other | Attending: Family Medicine | Admitting: Family Medicine

## 2019-04-03 ENCOUNTER — Inpatient Hospital Stay (HOSPITAL_COMMUNITY): Payer: Medicare Other

## 2019-04-03 ENCOUNTER — Emergency Department (HOSPITAL_COMMUNITY): Payer: Medicare Other

## 2019-04-03 ENCOUNTER — Encounter (HOSPITAL_COMMUNITY): Payer: Self-pay | Admitting: Emergency Medicine

## 2019-04-03 DIAGNOSIS — Z87891 Personal history of nicotine dependence: Secondary | ICD-10-CM

## 2019-04-03 DIAGNOSIS — I10 Essential (primary) hypertension: Secondary | ICD-10-CM | POA: Diagnosis present

## 2019-04-03 DIAGNOSIS — I00 Rheumatic fever without heart involvement: Secondary | ICD-10-CM | POA: Diagnosis not present

## 2019-04-03 DIAGNOSIS — L4050 Arthropathic psoriasis, unspecified: Secondary | ICD-10-CM | POA: Diagnosis present

## 2019-04-03 DIAGNOSIS — R7989 Other specified abnormal findings of blood chemistry: Secondary | ICD-10-CM | POA: Diagnosis not present

## 2019-04-03 DIAGNOSIS — I739 Peripheral vascular disease, unspecified: Secondary | ICD-10-CM | POA: Diagnosis not present

## 2019-04-03 DIAGNOSIS — Z9981 Dependence on supplemental oxygen: Secondary | ICD-10-CM

## 2019-04-03 DIAGNOSIS — N179 Acute kidney failure, unspecified: Secondary | ICD-10-CM | POA: Diagnosis present

## 2019-04-03 DIAGNOSIS — Z800 Family history of malignant neoplasm of digestive organs: Secondary | ICD-10-CM | POA: Diagnosis not present

## 2019-04-03 DIAGNOSIS — E872 Acidosis: Secondary | ICD-10-CM | POA: Diagnosis present

## 2019-04-03 DIAGNOSIS — Z8249 Family history of ischemic heart disease and other diseases of the circulatory system: Secondary | ICD-10-CM

## 2019-04-03 DIAGNOSIS — U071 COVID-19: Principal | ICD-10-CM | POA: Diagnosis present

## 2019-04-03 DIAGNOSIS — R0689 Other abnormalities of breathing: Secondary | ICD-10-CM | POA: Diagnosis not present

## 2019-04-03 DIAGNOSIS — Z86010 Personal history of colonic polyps: Secondary | ICD-10-CM | POA: Diagnosis not present

## 2019-04-03 DIAGNOSIS — K279 Peptic ulcer, site unspecified, unspecified as acute or chronic, without hemorrhage or perforation: Secondary | ICD-10-CM | POA: Diagnosis present

## 2019-04-03 DIAGNOSIS — Z85828 Personal history of other malignant neoplasm of skin: Secondary | ICD-10-CM

## 2019-04-03 DIAGNOSIS — J1282 Pneumonia due to coronavirus disease 2019: Secondary | ICD-10-CM | POA: Diagnosis present

## 2019-04-03 DIAGNOSIS — G9341 Metabolic encephalopathy: Secondary | ICD-10-CM | POA: Diagnosis not present

## 2019-04-03 DIAGNOSIS — Z801 Family history of malignant neoplasm of trachea, bronchus and lung: Secondary | ICD-10-CM | POA: Diagnosis not present

## 2019-04-03 DIAGNOSIS — R0602 Shortness of breath: Secondary | ICD-10-CM | POA: Diagnosis not present

## 2019-04-03 DIAGNOSIS — R52 Pain, unspecified: Secondary | ICD-10-CM | POA: Diagnosis not present

## 2019-04-03 DIAGNOSIS — Z7952 Long term (current) use of systemic steroids: Secondary | ICD-10-CM

## 2019-04-03 DIAGNOSIS — R0902 Hypoxemia: Secondary | ICD-10-CM | POA: Diagnosis not present

## 2019-04-03 DIAGNOSIS — J9601 Acute respiratory failure with hypoxia: Secondary | ICD-10-CM | POA: Diagnosis not present

## 2019-04-03 DIAGNOSIS — Z8616 Personal history of COVID-19: Secondary | ICD-10-CM | POA: Diagnosis present

## 2019-04-03 DIAGNOSIS — K625 Hemorrhage of anus and rectum: Secondary | ICD-10-CM | POA: Diagnosis present

## 2019-04-03 DIAGNOSIS — Z7982 Long term (current) use of aspirin: Secondary | ICD-10-CM

## 2019-04-03 DIAGNOSIS — Z79899 Other long term (current) drug therapy: Secondary | ICD-10-CM | POA: Diagnosis not present

## 2019-04-03 DIAGNOSIS — I2510 Atherosclerotic heart disease of native coronary artery without angina pectoris: Secondary | ICD-10-CM | POA: Diagnosis not present

## 2019-04-03 DIAGNOSIS — K649 Unspecified hemorrhoids: Secondary | ICD-10-CM | POA: Diagnosis present

## 2019-04-03 DIAGNOSIS — Z888 Allergy status to other drugs, medicaments and biological substances status: Secondary | ICD-10-CM

## 2019-04-03 DIAGNOSIS — Z66 Do not resuscitate: Secondary | ICD-10-CM | POA: Diagnosis not present

## 2019-04-03 LAB — COMPREHENSIVE METABOLIC PANEL
ALT: 38 U/L (ref 0–44)
AST: 121 U/L — ABNORMAL HIGH (ref 15–41)
Albumin: 3 g/dL — ABNORMAL LOW (ref 3.5–5.0)
Alkaline Phosphatase: 43 U/L (ref 38–126)
Anion gap: 13 (ref 5–15)
BUN: 31 mg/dL — ABNORMAL HIGH (ref 8–23)
CO2: 15 mmol/L — ABNORMAL LOW (ref 22–32)
Calcium: 7.6 mg/dL — ABNORMAL LOW (ref 8.9–10.3)
Chloride: 104 mmol/L (ref 98–111)
Creatinine, Ser: 1.91 mg/dL — ABNORMAL HIGH (ref 0.61–1.24)
GFR calc Af Amer: 39 mL/min — ABNORMAL LOW (ref >60)
GFR calc non Af Amer: 34 mL/min — ABNORMAL LOW (ref >60)
Glucose, Bld: 147 mg/dL — ABNORMAL HIGH (ref 70–99)
Potassium: 4.3 mmol/L (ref 3.5–5.1)
Sodium: 132 mmol/L — ABNORMAL LOW (ref 135–145)
Total Bilirubin: 1.5 mg/dL — ABNORMAL HIGH (ref 0.3–1.2)
Total Protein: 5.6 g/dL — ABNORMAL LOW (ref 6.5–8.1)

## 2019-04-03 LAB — URINALYSIS, ROUTINE W REFLEX MICROSCOPIC
Bilirubin Urine: NEGATIVE
Glucose, UA: NEGATIVE mg/dL
Ketones, ur: 5 mg/dL — AB
Leukocytes,Ua: NEGATIVE
Nitrite: NEGATIVE
Protein, ur: >=300 mg/dL — AB
Specific Gravity, Urine: >1.046 — ABNORMAL HIGH (ref 1.005–1.030)
pH: 5 (ref 5.0–8.0)

## 2019-04-03 LAB — CBC
HCT: 55.5 % — ABNORMAL HIGH (ref 39.0–52.0)
Hemoglobin: 18.8 g/dL — ABNORMAL HIGH (ref 13.0–17.0)
MCH: 28.6 pg (ref 26.0–34.0)
MCHC: 33.9 g/dL (ref 30.0–36.0)
MCV: 84.3 fL (ref 80.0–100.0)
Platelets: 265 10*3/uL (ref 150–400)
RBC: 6.58 MIL/uL — ABNORMAL HIGH (ref 4.22–5.81)
RDW: 16.9 % — ABNORMAL HIGH (ref 11.5–15.5)
WBC: 8.1 10*3/uL (ref 4.0–10.5)
nRBC: 0 % (ref 0.0–0.2)

## 2019-04-03 LAB — FOLATE: Folate: 20.4 ng/mL (ref >5.9)

## 2019-04-03 LAB — D-DIMER, QUANTITATIVE
D-Dimer, Quant: 14.05 ug/mL-FEU — ABNORMAL HIGH (ref 0.00–0.50)
D-Dimer, Quant: 5.09 ug/mL-FEU — ABNORMAL HIGH (ref 0.00–0.50)

## 2019-04-03 LAB — SAMPLE TO BLOOD BANK

## 2019-04-03 LAB — PROTIME-INR
INR: 1.4 — ABNORMAL HIGH (ref 0.8–1.2)
Prothrombin Time: 16.8 seconds — ABNORMAL HIGH (ref 11.4–15.2)

## 2019-04-03 LAB — APTT: aPTT: 39 seconds — ABNORMAL HIGH (ref 24–36)

## 2019-04-03 LAB — VITAMIN B12: Vitamin B-12: 198 pg/mL (ref 180–914)

## 2019-04-03 LAB — PROCALCITONIN: Procalcitonin: 7.31 ng/mL

## 2019-04-03 LAB — C-REACTIVE PROTEIN: CRP: 13.6 mg/dL — ABNORMAL HIGH (ref <1.0)

## 2019-04-03 LAB — TSH: TSH: 1.448 u[IU]/mL (ref 0.350–4.500)

## 2019-04-03 LAB — GLUCOSE, CAPILLARY: Glucose-Capillary: 138 mg/dL — ABNORMAL HIGH (ref 70–99)

## 2019-04-03 LAB — LACTIC ACID, PLASMA
Lactic Acid, Venous: 2.8 mmol/L (ref 0.5–1.9)
Lactic Acid, Venous: 1.4 mmol/L (ref 0.5–1.9)

## 2019-04-03 LAB — FERRITIN: Ferritin: 405 ng/mL — ABNORMAL HIGH (ref 24–336)

## 2019-04-03 LAB — SEDIMENTATION RATE: Sed Rate: 0 mm/hr (ref 0–16)

## 2019-04-03 LAB — AMMONIA: Ammonia: 24 umol/L (ref 9–35)

## 2019-04-03 MED ORDER — MISOPROSTOL 100 MCG PO TABS
100.0000 ug | ORAL_TABLET | Freq: Every day | ORAL | Status: DC
  Administered 2019-04-04 – 2019-04-07 (×4): 100 ug via ORAL
  Filled 2019-04-03 (×4): qty 1

## 2019-04-03 MED ORDER — INSULIN ASPART 100 UNIT/ML ~~LOC~~ SOLN: 0.0000 [IU] | Freq: Every day | SUBCUTANEOUS | Status: DC

## 2019-04-03 MED ORDER — SODIUM CHLORIDE 0.9 % IV SOLN
2.0000 g | Freq: Two times a day (BID) | INTRAVENOUS | Status: DC
  Administered 2019-04-03 – 2019-04-06 (×6): 2 g via INTRAVENOUS
  Filled 2019-04-03 (×6): qty 2

## 2019-04-03 MED ORDER — SODIUM CHLORIDE 0.9 % IV SOLN
200.0000 mg | Freq: Once | INTRAVENOUS | Status: AC
  Administered 2019-04-03: 13:00:00 200 mg via INTRAVENOUS
  Filled 2019-04-03: qty 200

## 2019-04-03 MED ORDER — INSULIN ASPART 100 UNIT/ML ~~LOC~~ SOLN
0.0000 [IU] | Freq: Three times a day (TID) | SUBCUTANEOUS | Status: DC
  Administered 2019-04-04 – 2019-04-07 (×4): 1 [IU] via SUBCUTANEOUS

## 2019-04-03 MED ORDER — FAMOTIDINE 20 MG PO TABS
20.0000 mg | ORAL_TABLET | Freq: Two times a day (BID) | ORAL | Status: DC
  Administered 2019-04-03 (×2): 20 mg via ORAL
  Filled 2019-04-03 (×2): qty 1

## 2019-04-03 MED ORDER — ZINC SULFATE 220 (50 ZN) MG PO CAPS
220.0000 mg | ORAL_CAPSULE | Freq: Every day | ORAL | Status: DC
  Administered 2019-04-03 – 2019-04-07 (×5): 220 mg via ORAL
  Filled 2019-04-03 (×5): qty 1

## 2019-04-03 MED ORDER — ASPIRIN EC 81 MG PO TBEC
81.0000 mg | DELAYED_RELEASE_TABLET | Freq: Every day | ORAL | Status: DC
  Administered 2019-04-03 – 2019-04-06 (×4): 81 mg via ORAL
  Filled 2019-04-03 (×4): qty 1

## 2019-04-03 MED ORDER — ACETAMINOPHEN 325 MG PO TABS
650.0000 mg | ORAL_TABLET | Freq: Four times a day (QID) | ORAL | Status: DC | PRN
  Administered 2019-04-03 – 2019-04-07 (×7): 650 mg via ORAL
  Filled 2019-04-03 (×7): qty 2

## 2019-04-03 MED ORDER — HEPARIN (PORCINE) 25000 UT/250ML-% IV SOLN
1500.0000 [IU]/h | INTRAVENOUS | Status: DC
  Administered 2019-04-03: 19:00:00 1500 [IU]/h via INTRAVENOUS
  Filled 2019-04-03: qty 250

## 2019-04-03 MED ORDER — DEXAMETHASONE 6 MG PO TABS: 6.0000 mg | ORAL_TABLET | Freq: Every day | ORAL | Status: DC

## 2019-04-03 MED ORDER — ISOSORBIDE MONONITRATE ER 30 MG PO TB24
120.0000 mg | ORAL_TABLET | Freq: Every day | ORAL | Status: DC
  Administered 2019-04-03 – 2019-04-07 (×5): 120 mg via ORAL
  Filled 2019-04-03 (×5): qty 4

## 2019-04-03 MED ORDER — SODIUM CHLORIDE 0.9% FLUSH
3.0000 mL | Freq: Two times a day (BID) | INTRAVENOUS | Status: DC
  Administered 2019-04-03 – 2019-04-07 (×8): 3 mL via INTRAVENOUS

## 2019-04-03 MED ORDER — IOHEXOL 350 MG/ML SOLN
75.0000 mL | Freq: Once | INTRAVENOUS | Status: AC | PRN
  Administered 2019-04-03: 75 mL via INTRAVENOUS

## 2019-04-03 MED ORDER — GUAIFENESIN-DM 100-10 MG/5ML PO SYRP: 10.0000 mL | ORAL_SOLUTION | ORAL | Status: DC | PRN

## 2019-04-03 MED ORDER — VANCOMYCIN HCL 2000 MG/400ML IV SOLN
2000.0000 mg | Freq: Once | INTRAVENOUS | Status: DC
  Filled 2019-04-03: qty 400

## 2019-04-03 MED ORDER — VANCOMYCIN VARIABLE DOSE PER UNSTABLE RENAL FUNCTION (PHARMACIST DOSING): Status: DC

## 2019-04-03 MED ORDER — LACTATED RINGERS IV BOLUS
500.0000 mL | Freq: Once | INTRAVENOUS | Status: AC
  Administered 2019-04-03: 500 mL via INTRAVENOUS

## 2019-04-03 MED ORDER — METHYLPREDNISOLONE SODIUM SUCC 125 MG IJ SOLR
50.0000 mg | Freq: Two times a day (BID) | INTRAMUSCULAR | Status: DC
  Administered 2019-04-03 – 2019-04-07 (×7): 50 mg via INTRAVENOUS
  Filled 2019-04-03 (×7): qty 2

## 2019-04-03 MED ORDER — SODIUM CHLORIDE 0.9 % IV SOLN: INTRAVENOUS | Status: DC

## 2019-04-03 MED ORDER — ALBUTEROL SULFATE HFA 108 (90 BASE) MCG/ACT IN AERS
2.0000 | INHALATION_SPRAY | Freq: Four times a day (QID) | RESPIRATORY_TRACT | Status: DC
  Administered 2019-04-03 – 2019-04-07 (×16): 2 via RESPIRATORY_TRACT
  Filled 2019-04-03 (×2): qty 6.7

## 2019-04-03 MED ORDER — ONDANSETRON HCL 4 MG PO TABS: 4.0000 mg | ORAL_TABLET | Freq: Four times a day (QID) | ORAL | Status: DC | PRN

## 2019-04-03 MED ORDER — SODIUM CHLORIDE 0.9 % IV BOLUS
500.0000 mL | Freq: Once | INTRAVENOUS | Status: AC
  Administered 2019-04-03: 500 mL via INTRAVENOUS

## 2019-04-03 MED ORDER — VANCOMYCIN HCL 2000 MG/400ML IV SOLN
2000.0000 mg | Freq: Once | INTRAVENOUS | Status: AC
  Administered 2019-04-03: 2000 mg via INTRAVENOUS
  Filled 2019-04-03: qty 400

## 2019-04-03 MED ORDER — RANOLAZINE ER 500 MG PO TB12
500.0000 mg | ORAL_TABLET | Freq: Two times a day (BID) | ORAL | Status: DC
  Administered 2019-04-03 – 2019-04-07 (×8): 500 mg via ORAL
  Filled 2019-04-03 (×10): qty 1

## 2019-04-03 MED ORDER — ONDANSETRON HCL 4 MG/2ML IJ SOLN
4.0000 mg | Freq: Four times a day (QID) | INTRAMUSCULAR | Status: DC | PRN
  Administered 2019-04-03: 4 mg via INTRAVENOUS
  Filled 2019-04-03: qty 2

## 2019-04-03 MED ORDER — DEXAMETHASONE SODIUM PHOSPHATE 10 MG/ML IJ SOLN
6.0000 mg | Freq: Once | INTRAMUSCULAR | Status: AC
  Administered 2019-04-03: 13:00:00 6 mg via INTRAVENOUS
  Filled 2019-04-03: qty 1

## 2019-04-03 MED ORDER — HYDROCOD POLST-CPM POLST ER 10-8 MG/5ML PO SUER
5.0000 mL | Freq: Two times a day (BID) | ORAL | Status: DC | PRN
  Administered 2019-04-06: 5 mL via ORAL
  Filled 2019-04-03: qty 5

## 2019-04-03 MED ORDER — SODIUM CHLORIDE 0.9 % IV SOLN
100.0000 mg | Freq: Every day | INTRAVENOUS | Status: AC
  Administered 2019-04-04 – 2019-04-07 (×4): 100 mg via INTRAVENOUS
  Filled 2019-04-03 (×5): qty 20

## 2019-04-03 MED ORDER — ASCORBIC ACID 500 MG PO TABS
500.0000 mg | ORAL_TABLET | Freq: Every day | ORAL | Status: DC
  Administered 2019-04-03 – 2019-04-07 (×5): 500 mg via ORAL
  Filled 2019-04-03 (×5): qty 1

## 2019-04-03 MED ORDER — LACTATED RINGERS IV SOLN: INTRAVENOUS | Status: AC

## 2019-04-03 MED ORDER — ENOXAPARIN SODIUM 40 MG/0.4ML ~~LOC~~ SOLN
40.0000 mg | SUBCUTANEOUS | Status: DC
  Administered 2019-04-03: 40 mg via SUBCUTANEOUS
  Filled 2019-04-03: qty 0.4

## 2019-04-03 NOTE — Progress Notes (Signed)
Remdesivir - Pharmacy Brief Note   O:  ALT: 23 CXR: Bilateral diffuse hazy and streaky densities most consistent with multifocal pneumonia and in keeping with COVID-19 SpO2: 96% on RA   A/P:  Remdesivir 200 mg IVPB once followed by 100 mg IVPB daily x 4 days.   Vertis Kelch, PharmD, [facility] PGY2 Cardiology Pharmacy Resident Phone [phone] 04/03/2019       11:42 AM  Please check [email or web address] for unit-specific pharmacist phone numbers

## 2019-04-03 NOTE — ED Notes (Signed)
Patient placed on 3L supplemental O2 via nasal canula for comfort/tachynpea.

## 2019-04-03 NOTE — H&P (Signed)
History and Physical    Edward Mejia [id number] DOB: 10-20-1945 DOA: 04/03/2019  Referring MD/NP/PA: Stoney Bang, MD PCP: Birdie Sons, MD  Patient coming from: Home via EMS  Chief Complaint: Shortness of breath  I have personally briefly reviewed patient's old medical records in [place]   HPI: Edward Mejia is a 74 y.o. male with medical history significant of hypertension, PVD, cellulitis, remote history of rheumatic fever, arthritis, and diagnosed with COVID-19 on 2/17.  He presents with complaints of worsening shortness of breath and fatigue.  Patient had received the bamlanivimab infusion on 2/19.  He reports having a persistent dry cough.  He has not been eating or drinking anything and wife estimates that he has had 16 ounces of Gatorade over the last few days. At home he had a fever up to 102.4 F that persisted throughout most of the day yesterday despite utilizing Tylenol.  He was taken to [facility] and given IV fluids, but was ultimately discharged home as he was maintaining O2 saturations on room air..  Chest x-ray noted is of a multifocal pneumonia. Since returning home his wife notes that he is been more weak, and was unable to stand without assistance.  This morning he spent approximately 2 hours on the commode due to his symptoms and had a loose bowel movement.  Wife also notes that due to his shortness of breath he has unable to talk.  Patient denied having any nausea, vomiting, or chest pain.  ED Course: On admission into the emergency department patient was noted to be febrile up to 102.9 F, pulse 113-121, respirations 34-49, O2 saturations currently maintained on 3 L of nasal cannula oxygen.  Labs significant from 1/23 WBC 6, hemoglobin 18.8, sodium 129, chloride 96, BUN 15, creatinine 1.12, glucose 117, total bilirubin 1.7, albumin 3.3, ferritin 405, lactic acid 2.8, CRP 13.6, sed rate 0, and D-dimer 14.05.  TRH called to  admit.  Review of Systems  Constitutional: Positive for diaphoresis, fever and malaise/fatigue.  HENT: Negative for ear pain and nosebleeds.   Eyes: Negative for photophobia and pain.  Respiratory: Positive for cough and shortness of breath. Negative for sputum production.   Cardiovascular: Negative for chest pain and leg swelling.  Gastrointestinal: Negative for abdominal pain, blood in stool, constipation, nausea and vomiting.  Genitourinary: Negative for dysuria and hematuria.  Musculoskeletal: Positive for joint pain.  Neurological: Positive for weakness. Negative for loss of consciousness.  Endo/Heme/Allergies: Negative for polydipsia.  Psychiatric/Behavioral: Negative for substance abuse.    Past Medical History:  Diagnosis Date  . Arthritis   . Cancer ([place])    skin cancer  . Cellulitis   . Cellulitis   . H/O adenomatous polyp of colon 03/24/2015  . History of brain disorder: history of amaurosis fugax 03/24/2015  . History of rheumatic fever 03/24/2015   DID Have Rheumatic Fever.    . Hypertension   . Peptic ulcer disease     Past Surgical History:  Procedure Laterality Date  . APPENDECTOMY    . LEFT HEART CATH AND CORONARY ANGIOGRAPHY Left 02/06/2018   Procedure: LEFT HEART CATH AND CORONARY ANGIOGRAPHY;  Surgeon: Teodoro Spray, MD;  Location: [facility];  Service: Cardiovascular;  Laterality: Left;  Marland Kitchen MANDIBLE FRACTURE SURGERY     fractured jaw  . PARTIAL GASTRECTOMY     peptic ulcer disease     reports that he has quit smoking. He has never used smokeless tobacco. He reports  that he does not drink alcohol or use drugs.  Allergies  Allergen Reactions  . Pravachol [Pravastatin] Other (See Comments)    Fatigue    Family History  Problem Relation Age of Onset  . Cancer Mother        lung cancer  . Heart disease Father 75       heart attack  . Cancer Brother        pancreatic cancer  . Cancer Brother        pancreatic cancer    Prior to  Admission medications   Medication Sig Start Date End Date Taking? Authorizing Provider  acetaminophen (TYLENOL) 500 MG tablet Take 1,000 mg by mouth 3 (three) times daily as needed for moderate pain or headache.    [provider]  clobetasol (TEMOVATE) 0.05 % GEL As needed 07/23/18   [provider]  Clotrimazole 1 % OINT Apply 1 application topically 2 (two) times daily. 10/19/18   Merlyn Lot, MD  cyclobenzaprine (FLEXERIL) 5 MG tablet Take 5 mg by mouth 3 (three) times daily as needed for muscle spasms.    [provider]  etanercept (ENBREL) 50 MG/ML injection Inject 50 mg into the skin every Thursday.     [provider]  folic acid (FOLVITE) 1 MG tablet Take 2 mg by mouth daily with lunch.    [provider]  gabapentin (NEURONTIN) 300 MG capsule Take 300 mg by mouth daily. @ 4 PM 10/02/18   [provider]  HYDROcodone-acetaminophen (NORCO) 7.5-325 MG tablet Take 1 tablet by mouth 3 (three) times daily as needed for moderate pain. Patient taking differently: Take 1 tablet by mouth every 6 (six) hours as needed for moderate pain.  06/04/18   Birdie Sons, MD  isosorbide mononitrate (IMDUR) 120 MG 24 hr tablet Take 120 mg by mouth daily.  09/24/18   [provider]  methotrexate (RHEUMATREX) 2.5 MG tablet Take 12.5 mg by mouth every Saturday.    [provider]  misoprostol (CYTOTEC) 100 MCG tablet Take 100 mcg by mouth daily with lunch.  10/31/11   [provider]  nitroGLYCERIN (NITROSTAT) 0.4 MG SL tablet Place 0.4 mg under the tongue every 5 (five) minutes as needed for chest pain.    [provider]  predniSONE (DELTASONE) 5 MG tablet Take 7.5 mg by mouth daily with lunch.     [provider]  tiZANidine (ZANAFLEX) 2 MG tablet Take 2 mg by mouth 2 (two) times daily. As needed 10/12/18   [provider]  valsartan-hydrochlorothiazide (DIOVAN-HCT) 160-12.5 MG tablet Take 1 tablet  by mouth daily. 02/22/19   Virginia Crews, MD  albuterol (PROVENTIL HFA;VENTOLIN HFA) 108 (90 Base) MCG/ACT inhaler Inhale 2 puffs into the lungs every 6 (six) hours as needed for wheezing. 05/01/17 05/09/17  Birdie Sons, MD    Physical Exam:  Constitutional: Elderly male who appears acutely ill Vitals:   04/03/19 1000 04/03/19 1015 04/03/19 1030 04/03/19 1045  BP: 127/77 119/74 107/61 108/71  Pulse: (!) 119 (!) 115 (!) 115 (!) 115  Resp: (!) 49 (!) 46 (!) 40 (!) 34  Temp:      TempSrc:      SpO2: 96% 95% 94% 96%   Eyes: PERRL, lids and conjunctivae normal ENMT: Mucous membranes are dry. Posterior pharynx clear of any exudate or lesions.  Neck: normal, supple, no masses, no thyromegaly Respiratory: Tachypneic with decreased overall aeration.  Patient currently on 2 L nasal cannula oxygen.  Only able to talk in short 2-3 words sentences. cardiovascular: Tachycardic, no murmurs / rubs / gallops. No extremity edema. 2+ pedal pulses. No carotid bruits.  Abdomen: no tenderness, no masses palpated. No hepatosplenomegaly. Bowel sounds positive.  Musculoskeletal: no clubbing / cyanosis. No joint deformity upper and lower extremities. Good ROM, no contractures. Normal muscle tone.  Skin: no rashes, lesions, ulcers. No induration Neurologic: CN 2-12 grossly intact. Sensation intact, DTR normal. Strength 5/5 in all 4.  Psychiatric: Normal judgment and insight. Alert and oriented x 3. Normal mood.     Labs on Admission: I have personally reviewed following labs and imaging studies  CBC: Recent Labs  Lab 04/02/19 1740  WBC 6.0  NEUTROABS 3.7  HGB 18.8*  HCT 55.5*  MCV 83.7  PLT [id number]   Basic Metabolic Panel: Recent Labs  Lab 04/02/19 1740  NA 129*  K 4.3  CL 96*  CO2 27  GLUCOSE 117*  BUN 15  CREATININE 1.12  CALCIUM 8.3*   GFR: Estimated Creatinine Clearance: 70.8 mL/min (by C-G formula based on SCr of 1.12 mg/dL). Liver Function Tests: Recent Labs  Lab  04/02/19 1740  AST 25  ALT 23  ALKPHOS 44  BILITOT 1.7*  PROT 6.1*  ALBUMIN 3.3*   No results for input(s): LIPASE, AMYLASE in the last 168 hours. No results for input(s): AMMONIA in the last 168 hours. Coagulation Profile: No results for input(s): INR, PROTIME in the last 168 hours. Cardiac Enzymes: No results for input(s): CKTOTAL, CKMB, CKMBINDEX, TROPONINI in the last 168 hours. BNP (last 3 results) No results for input(s): PROBNP in the last 8760 hours. HbA1C: No results for input(s): HGBA1C in the last 72 hours. CBG: No results for input(s): GLUCAP in the last 168 hours. Lipid Profile: No results for input(s): CHOL, HDL, LDLCALC, TRIG, CHOLHDL, LDLDIRECT in the last 72 hours. Thyroid Function Tests: No results for input(s): TSH, T4TOTAL, FREET4, T3FREE, THYROIDAB in the last 72 hours. Anemia Panel: Recent Labs    04/03/19 0916  FERRITIN 405*   Urine analysis: No results found for: COLORURINE, APPEARANCEUR, LABSPEC, PHURINE, GLUCOSEU, HGBUR, BILIRUBINUR, KETONESUR, PROTEINUR, UROBILINOGEN, NITRITE, LEUKOCYTESUR Sepsis Labs: Recent Results (from the past 240 hour(s))  Novel Coronavirus, NAA ([facility])     Status: Abnormal   Collection Time: 03/27/19  2:27 PM   Specimen: Nasopharyngeal(NP) swabs in vial transport medium   NASOPHARYNGE  TESTING  Result Value Ref Range Status   SARS-CoV-2, NAA Detected (A) Not Detected Final    Comment: Testing was performed using the cobas(R) SARS-CoV-2 test. This nucleic acid amplification test was developed and its performance characteristics determined by [employer]. Nucleic acid amplification tests include RT-PCR and TMA. This test has not been FDA cleared or approved. This test has been authorized by FDA under an Emergency Use Authorization (EUA). This test is only authorized for the duration of time the declaration that circumstances exist justifying the authorization of the emergency use of in vitro diagnostic tests  for detection of SARS-CoV-2 virus and/or diagnosis of COVID-19 infection under section 564(b)(1) of the Act, 21 U.S.C. [id number]) (1), unless the authorization is terminated or revoked sooner. When diagnostic testing is negative, the possibility of a false negative result should be considered in the context of a patient's recent exposures and the presence of clinical signs and symptoms consistent with COVID-19. An individual without sympto ms of COVID-19 and who is not shedding SARS-CoV-2 virus would expect to have a negative (not detected) result in this assay.  Radiological Exams on Admission: DG Chest 2 View  Result Date: 04/02/2019 CLINICAL DATA:  74 year old male with positive COVID-19 with worsening fatigue and weakness. EXAM: CHEST - 2 VIEW COMPARISON:  Chest radiograph dated 10/20/2017. FINDINGS: Bilateral diffuse hazy and streaky densities most consistent with multifocal pneumonia and in keeping with COVID-19. No lobar consolidation, pleural effusion, pneumothorax. Mild cardiomegaly. Atherosclerotic calcification of the aorta. No acute osseous pathology. Upper abdominal surgical clips. IMPRESSION: Multifocal pneumonia. Clinical correlation and follow-up to resolution recommended. Electronically Signed   By: Anner Crete M.D.   On: 04/02/2019 18:37   DG Chest Port 1 View  Result Date: 04/03/2019 CLINICAL DATA:  Shortness of breath in a patient who is COVID-19 positive. EXAM: PORTABLE CHEST 1 VIEW COMPARISON:  PA and lateral chest 04/02/2019 and 05/01/2017. FINDINGS: Patchy bilateral airspace disease seen on the most recent examination persists without notable change. Heart size is normal. No pneumothorax or pleural fluid. No acute or focal bony abnormality. IMPRESSION: No change of patchy bilateral airspace disease consistent with pneumonia. Electronically Signed   By: Inge Rise M.D.   On: 04/03/2019 10:14    EKG: Independently reviewed.  Sinus tachycardia 114  bpm  Assessment/Plan  Pneumonia due to COVID-19: Patient presents with worsening shortness of breath and fatigue after being diagnosed with COVID-19 on 2/17.  Chest x-ray from 2/23 noted a multifocal pneumonia.  O2 saturations maintained on 2 L nasal cannula oxygen.  Patient was noted to have elevated D-dimers and CRP. -Admit to a medical telemetry bed at [facility] -COVID-19 order set utilized -Continuous pulse oximetry with nasal cannula to maintain O2 saturation greater than 90% -Follow-up blood cultures -Check procalcitonin -Albuterol inhaler -Remdesivir per pharmacy -Decadron 6 mg daily -Daily monitoring of inflammatory markers  Essential hypertension: Blood pressures currently stable.  Home medications include valsartan-hydrochlorothiazide. -Hold valsartan hydrochlorothiazide  Psoriatic arthritis: Patient on Enbrel and chronic steroids. -Steroids as seen above  Peripheral vascular disease/CAD -Continue aspirin, Ranexa, Imdur   DVT prophylaxis: Lovenox Code Status: Full Family Communication: Wife updated over the phone Disposition Plan: To be determined Consults called: None Admission status: Inpatient  Norval Morton MD [facility] Pager [phone]   If 7PM-7AM, please contact night-coverage [email or web address] Password [email or web address]  04/03/2019, 11:11 AM

## 2019-04-03 NOTE — ED Notes (Signed)
ED Provider at bedside. 

## 2019-04-03 NOTE — ED Provider Notes (Signed)
[facility] EMERGENCY DEPARTMENT Provider Note   CSN: [id number] Arrival date & time: 04/03/19  [id number]     History Chief Complaint  Patient presents with  . Fatigue    covid + 2/17  . Shortness of Breath    Edward Mejia is a 74 y.o. male.  HPI Patient presents to the emergency department with Covid infection that has gotten worse.  The patient states he feels more short of breath than a week.  The patient was seen at [facility] last night and discharged home.  The patient states that nothing seems to make his condition better or worse.  The patient states that he feels very short of breath and is breathing fast.  The patient denies chest pain,headache,blurred vision, neck pain,  weakness, numbness, dizziness, anorexia, edema, abdominal pain, nausea, vomiting, diarrhea, rash, back pain, dysuria, hematemesis, bloody stool, near syncope, or syncope.    Past Medical History:  Diagnosis Date  . Arthritis   . Cancer ([place])    skin cancer  . Cellulitis   . Cellulitis   . H/O adenomatous polyp of colon 03/24/2015  . History of brain disorder: history of amaurosis fugax 03/24/2015  . History of rheumatic fever 03/24/2015   DID Have Rheumatic Fever.    . Hypertension   . Peptic ulcer disease     Patient Active Problem List   Diagnosis Date Noted  . Pneumonia due to COVID-19 virus 04/03/2019  . Recurrent cellulitis of lower extremity 04/04/2017  . Bilateral lower extremity edema 04/04/2017  . Bilateral lower extremity pain 04/04/2017  . Hypertension 05/18/2016  . Elevated blood pressure reading 04/19/2016  . Hip pain 09/15/2015  . Pain in the chest 03/27/2015  . Arthritis 03/24/2015  . History of GI bleed 03/24/2015  . History of brain disorder: history of amaurosis fugax 03/24/2015  . H/O adenomatous polyp of colon 03/24/2015  . Immunosuppressed status ([place]) 03/24/2015  . Psoriatic arthritis ([place]) 08/10/2011  . History of pneumonia 04/04/2008  .  Herpes 03/26/2007  . Dyslipidemia 11/25/2005  . Hypercholesteremia 11/23/2005    Past Surgical History:  Procedure Laterality Date  . APPENDECTOMY    . LEFT HEART CATH AND CORONARY ANGIOGRAPHY Left 02/06/2018   Procedure: LEFT HEART CATH AND CORONARY ANGIOGRAPHY;  Surgeon: Teodoro Spray, MD;  Location: [place] [facility];  Service: Cardiovascular;  Laterality: Left;  Marland Kitchen MANDIBLE FRACTURE SURGERY     fractured jaw  . PARTIAL GASTRECTOMY     peptic ulcer disease       Family History  Problem Relation Age of Onset  . Cancer Mother        lung cancer  . Heart disease Father 63       heart attack  . Cancer Brother        pancreatic cancer  . Cancer Brother        pancreatic cancer    Social History   Tobacco Use  . Smoking status: Former Research scientist (life sciences)  . Smokeless tobacco: Never Used  . Tobacco comment: quit over 40+ years ago  Substance Use Topics  . Alcohol use: No    Alcohol/week: 0.0 standard drinks  . Drug use: No    Home Medications Prior to Admission medications   Medication Sig Start Date End Date Taking? Authorizing Provider  acetaminophen (TYLENOL) 500 MG tablet Take 1,000 mg by mouth 3 (three) times daily as needed for moderate pain or headache.   Yes [provider]  aspirin 81 MG  EC tablet Take 81 mg by mouth daily. 02/01/18  Yes [provider]  etanercept (ENBREL) 50 MG/ML injection Inject 50 mg into the skin every Thursday.    Yes [provider]  gabapentin (NEURONTIN) 300 MG capsule Take 600 mg by mouth daily.  10/02/18  Yes [provider]  HYDROcodone-acetaminophen (NORCO) 7.5-325 MG tablet Take 1 tablet by mouth 3 (three) times daily as needed for moderate pain. Patient taking differently: Take 1 tablet by mouth every 6 (six) hours as needed for moderate pain.  06/04/18  Yes Birdie Sons, MD  isosorbide mononitrate (IMDUR) 120 MG 24 hr tablet Take 120 mg by mouth daily.  09/24/18  Yes [provider]    misoprostol (CYTOTEC) 100 MCG tablet Take 100 mcg by mouth daily with lunch.  10/31/11  Yes [provider]  nitroGLYCERIN (NITROSTAT) 0.4 MG SL tablet Place 0.4 mg under the tongue every 5 (five) minutes as needed for chest pain.   Yes [provider]  predniSONE (DELTASONE) 5 MG tablet Take 7.5 mg by mouth daily with lunch.    Yes [provider]  ranolazine (RANEXA) 500 MG 12 hr tablet Take 500 mg by mouth in the morning and at bedtime. 03/18/19  Yes [provider]  valsartan-hydrochlorothiazide (DIOVAN-HCT) 160-12.5 MG tablet Take 1 tablet by mouth daily. 02/22/19  Yes Bacigalupo, Dionne Bucy, MD  albuterol (PROVENTIL HFA;VENTOLIN HFA) 108 (90 Base) MCG/ACT inhaler Inhale 2 puffs into the lungs every 6 (six) hours as needed for wheezing. 05/01/17 05/09/17  Birdie Sons, MD    Allergies    Pravachol [pravastatin]  Review of Systems   Review of Systems  Physical Exam Updated Vital Signs BP 131/75   Pulse 95   Temp 100.2 F (37.9 C)   Resp (!) 38   SpO2 97%   Physical Exam Vitals and nursing note reviewed.  Constitutional:      General: He is not in acute distress.    Appearance: He is well-developed.  HENT:     Head: Normocephalic and atraumatic.  Eyes:     Pupils: Pupils are equal, round, and reactive to light.  Cardiovascular:     Rate and Rhythm: Normal rate and regular rhythm.     Heart sounds: Normal heart sounds. No murmur. No friction rub. No gallop.   Pulmonary:     Effort: Pulmonary effort is normal. No respiratory distress.     Breath sounds: Normal breath sounds. No decreased breath sounds, wheezing, rhonchi or rales.  Abdominal:     General: Bowel sounds are normal. There is no distension.     Palpations: Abdomen is soft.     Tenderness: There is no abdominal tenderness.  Musculoskeletal:     Cervical back: Normal range of motion and neck supple.  Skin:    General: Skin is warm and dry.     Capillary Refill: Capillary refill  takes less than 2 seconds.     Findings: No erythema or rash.  Neurological:     Mental Status: He is alert and oriented to person, place, and time.     Motor: No abnormal muscle tone.     Coordination: Coordination normal.  Psychiatric:        Behavior: Behavior normal.     ED Results / Procedures / Treatments   Labs (all labs ordered are listed, but only abnormal results are displayed) Labs Reviewed  C-REACTIVE PROTEIN - Abnormal; Notable for the following components:  Result Value   CRP 13.6 (*)    All other components within normal limits  FERRITIN - Abnormal; Notable for the following components:   Ferritin 405 (*)    All other components within normal limits  D-DIMER, QUANTITATIVE (NOT AT [facility]) - Abnormal; Notable for the following components:   D-Dimer, Quant 14.05 (*)    All other components within normal limits  LACTIC ACID, PLASMA - Abnormal; Notable for the following components:   Lactic Acid, Venous 2.8 (*)    All other components within normal limits  CULTURE, BLOOD (ROUTINE X 2)  CULTURE, BLOOD (ROUTINE X 2)  SEDIMENTATION RATE  LACTIC ACID, PLASMA  PROCALCITONIN  LACTATE DEHYDROGENASE  HEPATITIS B SURFACE ANTIGEN  FIBRINOGEN  ABO/RH  TYPE AND SCREEN    EKG EKG Interpretation  Date/Time:  Wednesday April 03 2019 09:04:53 EST Ventricular Rate:  114 PR Interval:    QRS Duration: 87 QT Interval:  308 QTC Calculation: 425 R Axis:   -54 Text Interpretation: Sinus tachycardia Atrial premature complexes Abnormal R-wave progression, late transition Inferior infarct, old Confirmed by Davonna Belling [phone]) on 04/03/2019 9:18:54 AM   Radiology DG Chest 2 View  Result Date: 04/02/2019 CLINICAL DATA:  74 year old male with positive COVID-19 with worsening fatigue and weakness. EXAM: CHEST - 2 VIEW COMPARISON:  Chest radiograph dated 10/20/2017. FINDINGS: Bilateral diffuse hazy and streaky densities most consistent with multifocal pneumonia and in  keeping with COVID-19. No lobar consolidation, pleural effusion, pneumothorax. Mild cardiomegaly. Atherosclerotic calcification of the aorta. No acute osseous pathology. Upper abdominal surgical clips. IMPRESSION: Multifocal pneumonia. Clinical correlation and follow-up to resolution recommended. Electronically Signed   By: Anner Crete M.D.   On: 04/02/2019 18:37   DG Chest Port 1 View  Result Date: 04/03/2019 CLINICAL DATA:  Shortness of breath in a patient who is COVID-19 positive. EXAM: PORTABLE CHEST 1 VIEW COMPARISON:  PA and lateral chest 04/02/2019 and 05/01/2017. FINDINGS: Patchy bilateral airspace disease seen on the most recent examination persists without notable change. Heart size is normal. No pneumothorax or pleural fluid. No acute or focal bony abnormality. IMPRESSION: No change of patchy bilateral airspace disease consistent with pneumonia. Electronically Signed   By: Inge Rise M.D.   On: 04/03/2019 10:14    Procedures Procedures (including critical care time)  Medications Ordered in ED Medications  enoxaparin (LOVENOX) injection 40 mg (40 mg Subcutaneous Given 04/03/19 1325)  sodium chloride flush (NS) 0.9 % injection 3 mL (3 mLs Intravenous Not Given 04/03/19 1301)  0.9 %  sodium chloride infusion (has no administration in time range)  remdesivir 200 mg in sodium chloride 0.9% 250 mL IVPB (0 mg Intravenous Stopped 04/03/19 1504)    Followed by  remdesivir 100 mg in sodium chloride 0.9 % 100 mL IVPB (has no administration in time range)  albuterol (VENTOLIN HFA) 108 (90 Base) MCG/ACT inhaler 2 puff (2 puffs Inhalation Given 04/03/19 1324)  dexamethasone (DECADRON) tablet 6 mg (has no administration in time range)  guaiFENesin-dextromethorphan (ROBITUSSIN DM) 100-10 MG/5ML syrup 10 mL (has no administration in time range)  chlorpheniramine-HYDROcodone (TUSSIONEX) 10-8 MG/5ML suspension 5 mL (has no administration in time range)  ascorbic acid (VITAMIN C) tablet 500 mg  (500 mg Oral Given 04/03/19 1329)  zinc sulfate capsule 220 mg (220 mg Oral Given 04/03/19 1329)  ondansetron (ZOFRAN) tablet 4 mg ( Oral See Alternative 04/03/19 1321)    Or  ondansetron (ZOFRAN) injection 4 mg (4 mg Intravenous Given 04/03/19 1321)  famotidine (PEPCID) tablet 20  mg (20 mg Oral Given 04/03/19 1329)  acetaminophen (TYLENOL) tablet 650 mg (650 mg Oral Given 04/03/19 1329)  sodium chloride 0.9 % bolus 500 mL (500 mLs Intravenous New Bag/Given 04/03/19 1321)  dexamethasone (DECADRON) injection 6 mg (6 mg Intravenous Given 04/03/19 1323)    ED Course  I have reviewed the triage vital signs and the nursing notes.  Pertinent labs & imaging results that were available during my care of the patient were reviewed by me and considered in my medical decision making (see chart for details).    MDM Rules/Calculators/A&P                     Final Clinical Impression(s) / ED Diagnoses Final diagnoses:  None  Patient will need admission for worsening Covid symptoms.  Patient is advised of the plan and all questions were answered.  I spoke with the [facility] hospitalist about the patient and he is requiring 2 L of oxygen Ove Amat was evaluated in Emergency Department on 04/03/2019 for the symptoms described in the history of present illness. He was evaluated in the context of the global COVID-19 pandemic, which necessitated consideration that the patient might be at risk for infection with the SARS-CoV-2 virus that causes COVID-19. Institutional protocols and algorithms that pertain to the evaluation of patients at risk for COVID-19 are in a state of rapid change based on information released by regulatory bodies including the CDC and federal and state organizations. These policies and algorithms were followed during the patient's care in the ED.   Rx / DC Orders ED Discharge Orders    None       Dalia Heading, Hershal Coria 04/03/19 [place], Nathan, MD 04/04/19 [phone]

## 2019-04-03 NOTE — Progress Notes (Addendum)
Seen on arrival to [place]  74 yo with hx of HTN, psoriatic arthritis, CAD, PUD and multiple other medical problems presenting with weakness, malaise, and hypoxia suspected related to COVID 19 infection.  Pt lethargic, but arousable and able to converse  Edward Mejia&Ox2 (didn't know month, notes he typically knows this) Denies pain.  SOB is "OK".  His most prominent symptoms is fatigue and weakness.  Today's Vitals   04/03/19 1415 04/03/19 1430 04/03/19 1445 04/03/19 1500  BP: 120/67 131/66 129/72 131/75  Pulse: 96 95 97 95  Resp: (!) 34 (!) 29 (!) 38 (!) 38  Temp:      TempSrc:      SpO2: 97% 96% 98% 97%  PainSc:       There is no height or weight on file to calculate BMI.  Lethargic, NAD.  Tachypneic.  RRR.  S/NT/ND.  No LEE.  Moving all extremities and following commands.  Edward Mejia&P  See Dr. Thompson Caul note for additional details regarding   Acute Hypoxic Resp Failure 2/2 COVID 19  Fever S/p Bamlanivimab on 2/19 Continue steroids/remdesivir Follow blood cx and procalcitonin  I/O, daily weights IS, OOB, prone as able Elevated d dimer, follow CT PE protocol and LE dopplers, start anticoagulation while awaiting results Addendum, procalcitonin elevated -> follow cultures, UA and urine culture -> will start broad spectrum abx, narrow as able.  Immunocompromised on chronic steroids and enbrel.   COVID-19 Labs  Recent Labs    04/03/19 0916  DDIMER 14.05*  FERRITIN 405*  CRP 13.6*    Lab Results  Component Value Date   SARSCOV2NAA Detected (Edward Mejia) 03/27/2019   Elevated D Dimer Heparin gtt -> follow CT PE protocol and LE dopplers  Acute Metabolic Encephalopathy Suspect this is 2/2 COVID infection Hold potentially sedating meds (gabapentin, narcotics - home norco) Follow ammonia and VBG.  B12, folate, TSH, urinalysis, blood cultures. Delirium precautions Further w/u as appropriate -> consider imaging if worsening or not improving  Psoriatic Arthritis On enbrel weekly Also, prednisone 7.5 mg  daily (steroids increased as noted above)  PVD  CAD: continue ranexa, imdur   HTN: hold arb/thiazide for now  PUD: continue misoprostol, H2 blocker - wife notes last time he had GI bleed/admission for ulcer was 2015 (caution with anticoagulation)  Code Status: discussed with wife due to patient's lethargy.  She noted that Edward Mejia has Edward Mejia DNR.  Will change code status to DNR, discussed with patient's wife, Edward Mejia.   H&P pending at this time.  Please refer to this as well for additional information.

## 2019-04-03 NOTE — ED Notes (Signed)
Dr. Smith at bedside.

## 2019-04-03 NOTE — Progress Notes (Signed)
Pharmacy Antibiotic Note  Edward Mejia is a 74 y.o. male admitted on 04/03/2019 with acute respiratory failure and fever.  Pharmacy has been consulted for cefepime and vancomyin dosing.   SCr nearly doubled  Plan: Cefepime 2 g iv q 12 hours.   Vancomycin 2000 mg iv once   -F/U SCr, UOP, dose vanc by levels -F/U cultures, MRSA PCR, plans for abx  Height: 6' 0.01" (182.9 cm) Weight: 205 lb 11 oz (93.3 kg) IBW/kg (Calculated) : 77.62  Temp (24hrs), Avg:100.6 F (38.1 C), Min:98.6 F (37 C), Max:102.9 F (39.4 C)  Recent Labs  Lab 04/02/19 1740 04/03/19 0925 04/03/19 1620  WBC 6.0  --  8.1  CREATININE 1.12  --  1.91*  LATICACIDVEN  --  2.8* 1.4    Estimated Creatinine Clearance: 40.3 mL/min (A) (by C-G formula based on SCr of 1.91 mg/dL (H)).    Allergies  Allergen Reactions  . Pravachol [Pravastatin] Other (See Comments)    Fatigue    Antimicrobials this admission: 2/24 vanc >>  2/24 cefepime >>   Dose adjustments this admission:   Microbiology results: 2/24 BCx: NGTD UCx:    MRSA PCR:   Thank you for allowing pharmacy to be a part of this patient's care.  Napoleon Form 04/03/2019 6:15 PM

## 2019-04-03 NOTE — Progress Notes (Signed)
ANTICOAGULATION CONSULT NOTE - Initial Consult  Pharmacy Consult for heparin infusion Indication: VTE with elevated D-dimer  Allergies  Allergen Reactions  . Pravachol [Pravastatin] Other (See Comments)    Fatigue    Patient Measurements: Height: 6' 0.01" (182.9 cm) Weight: 205 lb 11 oz (93.3 kg) IBW/kg (Calculated) : 77.62 Heparin Dosing Weight: 93.3 kg  Vital Signs: Temp: 98.6 F (37 C) (02/24 1653) Temp Source: Oral (02/24 1653) BP: 120/79 (02/24 1653) Pulse Rate: 92 (02/24 1653)  Labs: Recent Labs    04/02/19 1740 04/03/19 1620  HGB 18.8* 18.8*  HCT 55.5* 55.5*  PLT 347 265  APTT  --  39*  LABPROT  --  16.8*  INR  --  1.4*  CREATININE 1.12 1.91*    Estimated Creatinine Clearance: 40.3 mL/min (A) (by C-G formula based on SCr of 1.91 mg/dL (H)).   Medical History: Past Medical History:  Diagnosis Date  . Arthritis   . Cancer ([place])    skin cancer  . Cellulitis   . Cellulitis   . H/O adenomatous polyp of colon 03/24/2015  . History of brain disorder: history of amaurosis fugax 03/24/2015  . History of rheumatic fever 03/24/2015   DID Have Rheumatic Fever.    . Hypertension   . Peptic ulcer disease     Medications:  Medications Prior to Admission  Medication Sig Dispense Refill Last Dose  . acetaminophen (TYLENOL) 500 MG tablet Take 1,000 mg by mouth 3 (three) times daily as needed for moderate pain or headache.   04/02/2019 at Unknown time  . aspirin 81 MG EC tablet Take 81 mg by mouth daily.   04/01/2019  . etanercept (ENBREL) 50 MG/ML injection Inject 50 mg into the skin every Thursday.    03/28/2019  . gabapentin (NEURONTIN) 300 MG capsule Take 600 mg by mouth daily.    04/01/2019  . HYDROcodone-acetaminophen (NORCO) 7.5-325 MG tablet Take 1 tablet by mouth 3 (three) times daily as needed for moderate pain. (Patient taking differently: Take 1 tablet by mouth every 6 (six) hours as needed for moderate pain. ) 90 tablet 0 04/01/2019  . isosorbide mononitrate  (IMDUR) 120 MG 24 hr tablet Take 120 mg by mouth daily.    04/01/2019  . misoprostol (CYTOTEC) 100 MCG tablet Take 100 mcg by mouth daily with lunch.    04/01/2019  . nitroGLYCERIN (NITROSTAT) 0.4 MG SL tablet Place 0.4 mg under the tongue every 5 (five) minutes as needed for chest pain.   unk  . predniSONE (DELTASONE) 5 MG tablet Take 7.5 mg by mouth daily with lunch.    04/01/2019  . ranolazine (RANEXA) 500 MG 12 hr tablet Take 500 mg by mouth in the morning and at bedtime.   04/01/2019  . valsartan-hydrochlorothiazide (DIOVAN-HCT) 160-12.5 MG tablet Take 1 tablet by mouth daily. 30 tablet 3 04/01/2019   Scheduled:  . albuterol  2 puff Inhalation Q6H  . vitamin C  500 mg Oral Daily  . aspirin EC  81 mg Oral Daily  . famotidine  20 mg Oral BID  . insulin aspart  0-5 Units Subcutaneous QHS  . [START ON 04/04/2019] insulin aspart  0-9 Units Subcutaneous TID WC  . isosorbide mononitrate  120 mg Oral Daily  . methylPREDNISolone (SOLU-MEDROL) injection  50 mg Intravenous Q12H  . [START ON 04/04/2019] misoprostol  100 mcg Oral Q lunch  . ranolazine  500 mg Oral BID  . sodium chloride flush  3 mL Intravenous Q12H  . vancomycin variable dose  per unstable renal function (pharmacist dosing)   Does not apply See admin instructions  . zinc sulfate  220 mg Oral Daily    Assessment: Pharmacy consulted to dose heparin infusion for VTE with elevated D-dimer.  One dose of enoxaparin 40mg  subq was administered earlier today at 1325.  Dr Florene Glen requested no heparin bolus due to history of gi bleed/PUD.  Goal of Therapy:  Heparin level 0.3-0.7 units/ml Monitor platelets by anticoagulation protocol: Yes   Plan:  Begin heparin infusion at 1500 units/hr with no bolus Obtain first heparin level 8 hours after start of infusion   Monitor daily heparin level when achieves goal, CBC, s/s bleeding  Efraim Kaufmann, PharmD, BCPS 04/03/2019,7:10 PM

## 2019-04-03 NOTE — ED Notes (Signed)
[facility] at bedside to transport patient to [facility].

## 2019-04-03 NOTE — ED Triage Notes (Signed)
Pt arrives via [facility] from home, tested positive for covid 2/17, was seen at [facility] last night for fatigue and sob. pts wife states he was sent home but is progressively getting more fatigued and sob. Given 4mg  zofran 500mg  tylenol- temp was 101.9 per ems. O2 sats 92% on 2L/min, RR 50, HR 110. Pt a/ox4.

## 2019-04-03 NOTE — ED Notes (Signed)
Daughter Lynelle Smoke would like an update. [phone]

## 2019-04-04 ENCOUNTER — Inpatient Hospital Stay (HOSPITAL_COMMUNITY): Payer: Medicare Other

## 2019-04-04 DIAGNOSIS — R7989 Other specified abnormal findings of blood chemistry: Secondary | ICD-10-CM

## 2019-04-04 LAB — CBC WITH DIFFERENTIAL/PLATELET
Abs Immature Granulocytes: 0.16 10*3/uL — ABNORMAL HIGH (ref 0.00–0.07)
Basophils Absolute: 0 10*3/uL (ref 0.0–0.1)
Basophils Relative: 0 %
Eosinophils Absolute: 0 10*3/uL (ref 0.0–0.5)
Eosinophils Relative: 0 %
HCT: 50.7 % (ref 39.0–52.0)
Hemoglobin: 16.6 g/dL (ref 13.0–17.0)
Immature Granulocytes: 3 %
Lymphocytes Relative: 24 %
Lymphs Abs: 1.1 10*3/uL (ref 0.7–4.0)
MCH: 28.2 pg (ref 26.0–34.0)
MCHC: 32.7 g/dL (ref 30.0–36.0)
MCV: 86.2 fL (ref 80.0–100.0)
Monocytes Absolute: 0.3 10*3/uL (ref 0.1–1.0)
Monocytes Relative: 6 %
Neutro Abs: 3.1 10*3/uL (ref 1.7–7.7)
Neutrophils Relative %: 67 %
Platelets: 269 10*3/uL (ref 150–400)
RBC: 5.88 MIL/uL — ABNORMAL HIGH (ref 4.22–5.81)
RDW: 15.5 % (ref 11.5–15.5)
WBC: 4.7 10*3/uL (ref 4.0–10.5)
nRBC: 0 % (ref 0.0–0.2)

## 2019-04-04 LAB — POCT I-STAT EG7
Acid-base deficit: 9 mmol/L — ABNORMAL HIGH (ref 0.0–2.0)
Bicarbonate: 15.1 mmol/L — ABNORMAL LOW (ref 20.0–28.0)
Calcium, Ion: 1.1 mmol/L — ABNORMAL LOW (ref 1.15–1.40)
HCT: 57 % — ABNORMAL HIGH (ref 39.0–52.0)
Hemoglobin: 19.4 g/dL — ABNORMAL HIGH (ref 13.0–17.0)
O2 Saturation: 92 %
Patient temperature: 98.6
Potassium: 4.1 mmol/L (ref 3.5–5.1)
Sodium: 134 mmol/L — ABNORMAL LOW (ref 135–145)
TCO2: 16 mmol/L — ABNORMAL LOW (ref 22–32)
pCO2, Ven: 29.1 mmHg — ABNORMAL LOW (ref 44.0–60.0)
pH, Ven: 7.324 (ref 7.250–7.430)
pO2, Ven: 67 mmHg — ABNORMAL HIGH (ref 32.0–45.0)

## 2019-04-04 LAB — URINE CULTURE: Culture: <10000 — AB

## 2019-04-04 LAB — VANCOMYCIN, RANDOM: Vancomycin Rm: 10

## 2019-04-04 LAB — HEPARIN LEVEL (UNFRACTIONATED)
Heparin Unfractionated: <0.1 IU/mL — ABNORMAL LOW (ref 0.30–0.70)
Heparin Unfractionated: <0.1 IU/mL — ABNORMAL LOW (ref 0.30–0.70)

## 2019-04-04 LAB — HEMOGLOBIN AND HEMATOCRIT, BLOOD
HCT: 53.2 % — ABNORMAL HIGH (ref 39.0–52.0)
Hemoglobin: 17.9 g/dL — ABNORMAL HIGH (ref 13.0–17.0)

## 2019-04-04 LAB — COMPREHENSIVE METABOLIC PANEL
ALT: 34 U/L (ref 0–44)
AST: 109 U/L — ABNORMAL HIGH (ref 15–41)
Albumin: 2.4 g/dL — ABNORMAL LOW (ref 3.5–5.0)
Alkaline Phosphatase: 32 U/L — ABNORMAL LOW (ref 38–126)
Anion gap: 14 (ref 5–15)
BUN: 32 mg/dL — ABNORMAL HIGH (ref 8–23)
CO2: 15 mmol/L — ABNORMAL LOW (ref 22–32)
Calcium: 7.4 mg/dL — ABNORMAL LOW (ref 8.9–10.3)
Chloride: 106 mmol/L (ref 98–111)
Creatinine, Ser: 1.62 mg/dL — ABNORMAL HIGH (ref 0.61–1.24)
GFR calc Af Amer: 48 mL/min — ABNORMAL LOW (ref >60)
GFR calc non Af Amer: 41 mL/min — ABNORMAL LOW (ref >60)
Glucose, Bld: 131 mg/dL — ABNORMAL HIGH (ref 70–99)
Potassium: 4.4 mmol/L (ref 3.5–5.1)
Sodium: 135 mmol/L (ref 135–145)
Total Bilirubin: 1 mg/dL (ref 0.3–1.2)
Total Protein: 4.7 g/dL — ABNORMAL LOW (ref 6.5–8.1)

## 2019-04-04 LAB — GLUCOSE, CAPILLARY
Glucose-Capillary: 124 mg/dL — ABNORMAL HIGH (ref 70–99)
Glucose-Capillary: 87 mg/dL (ref 70–99)
Glucose-Capillary: 116 mg/dL — ABNORMAL HIGH (ref 70–99)
Glucose-Capillary: 119 mg/dL — ABNORMAL HIGH (ref 70–99)

## 2019-04-04 LAB — HEMOGLOBIN A1C
Hgb A1c MFr Bld: 5.8 % — ABNORMAL HIGH (ref 4.8–5.6)
Mean Plasma Glucose: 119.76 mg/dL

## 2019-04-04 LAB — C-REACTIVE PROTEIN: CRP: 18.1 mg/dL — ABNORMAL HIGH (ref <1.0)

## 2019-04-04 LAB — D-DIMER, QUANTITATIVE: D-Dimer, Quant: 2.55 ug/mL-FEU — ABNORMAL HIGH (ref 0.00–0.50)

## 2019-04-04 LAB — PHOSPHORUS: Phosphorus: 3.9 mg/dL (ref 2.5–4.6)

## 2019-04-04 LAB — PROCALCITONIN: Procalcitonin: 6.4 ng/mL

## 2019-04-04 LAB — MRSA PCR SCREENING: MRSA by PCR: NEGATIVE

## 2019-04-04 LAB — FERRITIN: Ferritin: 376 ng/mL — ABNORMAL HIGH (ref 24–336)

## 2019-04-04 LAB — MAGNESIUM: Magnesium: 1.8 mg/dL (ref 1.7–2.4)

## 2019-04-04 MED ORDER — PANTOPRAZOLE SODIUM 40 MG IV SOLR
40.0000 mg | Freq: Two times a day (BID) | INTRAVENOUS | Status: DC
  Administered 2019-04-04 – 2019-04-05 (×3): 40 mg via INTRAVENOUS
  Filled 2019-04-04 (×3): qty 40

## 2019-04-04 MED ORDER — VANCOMYCIN HCL 1500 MG/300ML IV SOLN
1500.0000 mg | Freq: Once | INTRAVENOUS | Status: AC
  Administered 2019-04-04: 23:00:00 1500 mg via INTRAVENOUS
  Filled 2019-04-04: qty 300

## 2019-04-04 MED ORDER — SODIUM BICARBONATE 650 MG PO TABS
650.0000 mg | ORAL_TABLET | Freq: Three times a day (TID) | ORAL | Status: DC
  Administered 2019-04-04 – 2019-04-05 (×3): 650 mg via ORAL
  Filled 2019-04-04 (×5): qty 1

## 2019-04-04 MED ORDER — ENOXAPARIN SODIUM 40 MG/0.4ML ~~LOC~~ SOLN
40.0000 mg | SUBCUTANEOUS | Status: DC
  Administered 2019-04-04 – 2019-04-06 (×3): 40 mg via SUBCUTANEOUS
  Filled 2019-04-04 (×3): qty 0.4

## 2019-04-04 MED ORDER — HEPARIN (PORCINE) 25000 UT/250ML-% IV SOLN
1800.0000 [IU]/h | INTRAVENOUS | Status: DC
  Administered 2019-04-04: 06:00:00 1800 [IU]/h via INTRAVENOUS

## 2019-04-04 NOTE — Progress Notes (Signed)
Lower extremity venous has been completed.   Preliminary results in CV Proc.   Abram Sander 04/04/2019 3:01 PM

## 2019-04-04 NOTE — Progress Notes (Signed)
ANTICOAGULATION CONSULT NOTE - Follow Up Consult  Pharmacy Consult for heparin infusion Indication: VTE with elevated D-dimer  Allergies  Allergen Reactions  . Pravachol [Pravastatin] Other (See Comments)    Fatigue    Patient Measurements: Height: 6' 0.01" (182.9 cm) Weight: 205 lb 11 oz (93.3 kg) IBW/kg (Calculated) : 77.62 Heparin Dosing Weight: 93.3 kg  Vital Signs: Temp: 97.9 F (36.6 C) (02/25 0400) Temp Source: Oral (02/25 0400) BP: 111/75 (02/25 0400) Pulse Rate: 63 (02/25 0400)  Labs: Recent Labs    04/02/19 1740 04/02/19 1740 04/03/19 1620 04/04/19 0304  HGB 18.8*   < > 18.8* 16.6  HCT 55.5*  --  55.5* 50.7  PLT 347  --  265 269  APTT  --   --  39*  --   LABPROT  --   --  16.8*  --   INR  --   --  1.4*  --   HEPARINUNFRC  --   --   --  <0.10*  CREATININE 1.12  --  1.91* 1.62*   < > = values in this interval not displayed.    Estimated Creatinine Clearance: 47.5 mL/min (A) (by C-G formula based on SCr of 1.62 mg/dL (H)).   Medical History: Past Medical History:  Diagnosis Date  . Arthritis   . Cancer ([place])    skin cancer  . Cellulitis   . Cellulitis   . H/O adenomatous polyp of colon 03/24/2015  . History of brain disorder: history of amaurosis fugax 03/24/2015  . History of rheumatic fever 03/24/2015   DID Have Rheumatic Fever.    . Hypertension   . Peptic ulcer disease     Medications:  Medications Prior to Admission  Medication Sig Dispense Refill Last Dose  . acetaminophen (TYLENOL) 500 MG tablet Take 1,000 mg by mouth 3 (three) times daily as needed for moderate pain or headache.   04/02/2019 at Unknown time  . aspirin 81 MG EC tablet Take 81 mg by mouth daily.   04/01/2019  . etanercept (ENBREL) 50 MG/ML injection Inject 50 mg into the skin every Thursday.    03/28/2019  . gabapentin (NEURONTIN) 300 MG capsule Take 600 mg by mouth daily.    04/01/2019  . HYDROcodone-acetaminophen (NORCO) 7.5-325 MG tablet Take 1 tablet by mouth 3 (three)  times daily as needed for moderate pain. (Patient taking differently: Take 1 tablet by mouth every 6 (six) hours as needed for moderate pain. ) 90 tablet 0 04/01/2019  . isosorbide mononitrate (IMDUR) 120 MG 24 hr tablet Take 120 mg by mouth daily.    04/01/2019  . misoprostol (CYTOTEC) 100 MCG tablet Take 100 mcg by mouth daily with lunch.    04/01/2019  . nitroGLYCERIN (NITROSTAT) 0.4 MG SL tablet Place 0.4 mg under the tongue every 5 (five) minutes as needed for chest pain.   unk  . predniSONE (DELTASONE) 5 MG tablet Take 7.5 mg by mouth daily with lunch.    04/01/2019  . ranolazine (RANEXA) 500 MG 12 hr tablet Take 500 mg by mouth in the morning and at bedtime.   04/01/2019  . valsartan-hydrochlorothiazide (DIOVAN-HCT) 160-12.5 MG tablet Take 1 tablet by mouth daily. 30 tablet 3 04/01/2019   Scheduled:  . albuterol  2 puff Inhalation Q6H  . vitamin C  500 mg Oral Daily  . aspirin EC  81 mg Oral Daily  . famotidine  20 mg Oral BID  . insulin aspart  0-5 Units Subcutaneous QHS  . insulin aspart  0-9 Units Subcutaneous TID WC  . isosorbide mononitrate  120 mg Oral Daily  . methylPREDNISolone (SOLU-MEDROL) injection  50 mg Intravenous Q12H  . misoprostol  100 mcg Oral Q lunch  . ranolazine  500 mg Oral BID  . sodium chloride flush  3 mL Intravenous Q12H  . vancomycin variable dose per unstable renal function (pharmacist dosing)   Does not apply See admin instructions  . zinc sulfate  220 mg Oral Daily    Assessment: Pharmacy consulted to dose heparin infusion for VTE with elevated D-dimer.  One dose of enoxaparin 40mg  subq was administered earlier today at 1325.  Dr Florene Glen requested no heparin bolus due to history of gi bleed/PUD. CT and dopplers pending.  First heparin level is undetectable < 0.1, no issues with IV site per discussion with RN.  No bleeding reported. CBC wnl.  Goal of Therapy:  Heparin level 0.3-0.7 units/ml Monitor platelets by anticoagulation protocol: Yes   Plan:   Increase heparin infusion to 1800 units/hr with no bolus Recheck heparin level 8 hours after rate increase Monitor daily heparin level when achieves goal, CBC, s/s bleeding  Peggyann Juba, PharmD, BCPS Pharmacy: [phone] 04/04/2019,5:23 AM

## 2019-04-04 NOTE — Progress Notes (Signed)
Pharmacy Antibiotic Note  Edward Mejia is a 74 y.o. male admitted on 04/03/2019 with acute respiratory failure and fever.  Pharmacy has been consulted for cefepime and vancomyin dosing.  PCT 7.31 > 6.4 SCr elevated but improving (Baseline SCr 1 > 1.9 > 1.6) UOP 250 mL/ 24 hrs documented.  Vancomycin random level = 10 mcg/ml ~24hr after 2g dose last night  Plan: Vancomycin 1500mg  IV x 1 tonight -F/U SCr, UOP -F/U cultures, MRSA PCR, plans for abx  Height: 6' 0.01" (182.9 cm) Weight: 205 lb 11 oz (93.3 kg) IBW/kg (Calculated) : 77.62  Temp (24hrs), Avg:98.1 F (36.7 C), Min:97.9 F (36.6 C), Max:98.3 F (36.8 C)  Recent Labs  Lab 04/02/19 1740 04/03/19 0925 04/03/19 1620 04/04/19 0304 04/04/19 2154  WBC 6.0  --  8.1 4.7  --   CREATININE 1.12  --  1.91* 1.62*  --   LATICACIDVEN  --  2.8* 1.4  --   --   VANCORANDOM  --   --   --   --  10    Estimated Creatinine Clearance: 47.5 mL/min (A) (by C-G formula based on SCr of 1.62 mg/dL (H)).    Allergies  Allergen Reactions  . Pravachol [Pravastatin] Other (See Comments)    Fatigue    Antimicrobials this admission: Remdesivir 2/24 >> (2/28) 2/24 vanc>> 2/24 cefepime>>  Dose adjustments this admission: 2/25 22:00 Vanc random level (24 hrs after loading dose) = 10 mcg/ml  Microbiology results: 2/24BCx: NGTD 2/24 UCx: <10k insignificant growth 2/25 MRSA PCR: neg  Thank you for allowing pharmacy to be a part of this patient's care.  Peggyann Juba, PharmD, BCPS Pharmacy: [phone] Clinical pharmacist phone 7am- 5pm: [phone] 04/04/2019 11:05 PM

## 2019-04-04 NOTE — Plan of Care (Signed)
Pt is AAOX 2-3 and forgetful. Pt did not eat any meals for the day and stated that he was not hungry. Pt is at RA sating 100%. Pt sat up in chair for some hours and requested to lay back in bed. Pt also had 1 loose stool no evidence of blood. Will continue to monitor.  Problem: Education: Goal: Knowledge of risk factors and measures for prevention of condition will improve Outcome: Progressing   Problem: Coping: Goal: Psychosocial and spiritual needs will be supported Outcome: Progressing   Problem: Respiratory: Goal: Will maintain a patent airway Outcome: Progressing Goal: Complications related to the disease process, condition or treatment will be avoided or minimized Outcome: Progressing

## 2019-04-04 NOTE — Progress Notes (Signed)
**Note De-identified vi Obfusction** PROGRESS NOTE    Mttew Ry Mejia  MRN:[id number] DOB: 11/16/1945 DO: 04/03/2019 PCP: Fisher, Donld E, MD   Brief Nrrtive:  Edward Mejia is  74 y.o. mle with medicl history significnt of hypertension, PVD, cellulitis, remote history of rheumtic fever, rthritis, nd dignosed with COVID-19 on 2/17.  He presents with complints of worsening shortness of breth nd ftigue.  Ptient hd received the bmlnivimb infusion on 2/19.  He reports hving  persistent dry cough.  He hs not been eting or drinking nything nd wife estimtes tht he hs hd 16 ounces of Gtorde over the lst few dys. t home he hd  fever up to 102.4 F tht persisted throughout most of the dy yesterdy despite utilizing Tylenol.  [facility] nd given IV fluids, but ws ultimtely dischrged home s he ws mintining O2 sturtions on room ir..  Chest x-ry noted is of  multifocl pneumoni. Since returning home his wife notes tht he is been more wek, nd ws unble to stnd without ssistnce.  This morning he spent pproximtely 2 hours on the commode due to his symptoms nd hd  loose bowel movement.  Wife lso notes tht due to his shortness of breth he hs unble to tlk.  Ptient denied hving ny nuse, vomiting, or chest pin.   ssessment & Pln:   Principl Problem:   Pneumoni due to COVID-19 virus ctive Problems:   Psoritic rthritis (HCC)   Benign essentil HTN   PVD (peripherl vsculr disese) (HCC)  cute Hypoxic Resp Filure 2/2 COVID 19  Community cquired Pneumoni O2 requirement improved tody, on R S/p Bmlnivimb on 2/19 Continue steroids/remdesivir Follow blood cx (NGTD) nd proclcitonin (elevted to 7.3 on presenttion, downtrending) I/O, dily weights IS, OOB, prone s ble Elevted d dimer, CT PE protocol negtive for PE, LE dopplers pending  U not c/w UTI with negtive LE, nitrtie - + RBC's nd WBC's -> follow cx with  elevted proclcitonin MRS PCR pending Continue vnc/cefepime for now, nrrow s ble  Elevted D Dimer CT PE negtive for PE LE dopplers pending Heprin stopped with concern for blood in stool  Concern for GI bleed: nighttime RN reportedly noted blood mixed with stool on 1 occsion.  Nursing this morning notes this hs resolved nd no blood seen in stool this M.  Will continue to follow H/H nd wtch for dditionl si/sx of bleeding, though seems like this ws self limited bsed on discussion with nursing.  Stop heprin gtt Trend H/H Strt PPI BID IV Consider GI c/s   cute Metbolic Encephlopthy Suspect this is 2/2 COVID infection Improving tody, &Ox2, but more lert, still  bit distrctble Hold potentilly sedting meds (gbpentin, nrcotics - home norco) Follow mmoni (wnl) nd VBG (not done, but with improvement will ctm).  B12 (low norml, follow MM), folte (wnl), TSH (wnl), urinlysis (s bove), blood cultures (s bove). Delirium precutions Further w/u s pproprite -> consider imging if worsening or not improving  KI  NGM: improving, likely 2/2 hypovolemi nd poor intke.  NGM likely 2/2 KI.  Continue to monitor.  PO bicrb.  Psoritic rthritis On enbrel weekly lso, prednisone 7.5 mg dily (steroids incresed s noted bove)  PVD  CD: continue rnex, imdur   HTN: hold rb/thizide for now  PUD: continue misoprostol, H2 blocker - wife notes lst time he hd GI bleed/dmission for ulcer ws 2015 (cution with nticogultion)  DVT prophylxis: SCD Code Sttus: DNR Fmily Communiction: none t bedside -  **Note De-identified vi Obfusction** wife over phone Disposition Pln:  . Ptient cme from: home           . nticipted d/c plce: home . Brriers to d/c OR conditions which need to be met to effect  sfe d/c: pending improvement in cute metbolic encephlopthy, completion of remdesivir   Consultnts:   none  Procedures:  none  ntimicrobils:    nti-infectives (From dmission, onwrd)   Strt     Dose/Rte Route Frequency Ordered Stop   04/04/19 1000  remdesivir 100 mg in sodium chloride 0.9 % 100 mL IVPB     100 mg 200 mL/hr over 30 Minutes Intrvenous Dily 04/03/19 1136 04/08/19 0959   04/03/19 2200  vncomycin (VNCOREDY) IVPB 2000 mg/400 mL     2,000 mg 200 mL/hr over 120 Minutes Intrvenous  Once 04/03/19 2059 04/04/19 0002   04/03/19 1900  ceFEPIme (MXIPIME) 2 g in sodium chloride 0.9 % 100 mL IVPB     2 g 200 mL/hr over 30 Minutes Intrvenous Every 12 hours 04/03/19 1814     04/03/19 1900  vncomycin (VNCOREDY) IVPB 2000 mg/400 mL  Sttus:  Discontinued     2,000 mg 200 mL/hr over 120 Minutes Intrvenous  Once 04/03/19 1814 04/03/19 2102   04/03/19 1840  vncomycin vrible dose per unstble renl function (phrmcist dosing)      Does not pply See dmin instructions 04/03/19 1840     04/03/19 1200  remdesivir 200 mg in sodium chloride 0.9% 250 mL IVPB     200 mg 580 mL/hr over 30 Minutes Intrvenous Once 04/03/19 1136 04/03/19 1504      Subjective: Feels better tody &Ox2 Distrcted, but more lert nd pproprite thn yesterdy  Objective: Vitls:   04/03/19 1700 04/03/19 2042 04/04/19 0000 04/04/19 0400  BP: 116/73 130/83 117/79 111/75  Pulse: 81 76 66 63  Resp: (!) 25 (!) 24 (!) 23 (!) 23  Temp: 98.9 F (37.2 C) 97.8 F (36.6 C) 98 F (36.7 C) 97.9 F (36.6 C)  TempSrc: Orl Orl Orl Orl  SpO2: 94% 96% 96% 97%  Weight:      Height:        Intke/Output Summry (Lst 24 hours) t 04/04/2019 1102 Lst dt filed t 04/04/2019 1058 Gross per 24 hour  Intke 791.96 ml  Output 450 ml  Net 341.96 ml   Filed Weights   04/03/19 1653  Weight: 93.3 kg    Exmintion:  Generl exm: ppers clm nd comfortble  Respirtory system: Cler to usculttion. Respirtory effort norml. Crdiovsculr system: S1 & S2 herd, RRR.  Gstrointestinl system: bdomen is nondistended, soft nd  nontender.. Centrl nervous system: lert nd oriented x2. No focl neurologicl deficits. Extremities: no LEE Skin: No rshes, lesions or ulcers Psychitry: Judgement nd insight pper norml. Mood & ffect pproprite.     Dt Reviewed: I hve personlly reviewed following lbs nd imging studies  CBC: Recent Lbs  Lb 04/02/19 1740 04/03/19 1620 04/03/19 1637 04/04/19 0304  WBC 6.0 8.1  --  4.7  NEUTROBS 3.7  --   --  3.1  HGB 18.8* 18.8* 19.4* 16.6  HCT 55.5* 55.5* 57.0* 50.7  MCV 83.7 84.3  --  86.2  PLT 347 265  --  269   Bsic Metbolic Pnel: Recent Lbs  Lb 04/02/19 1740 04/03/19 1620 04/03/19 1637 04/04/19 0304  N 129* 132* 134* 135  K 4.3 4.3 4.1 4.4  CL 96* 104  --  106  CO2 27 15*  --    15*  GLUCOSE 117* 147*  --  131*  BUN 15 31*  --  32*  CREATININE 1.12 1.91*  --  1.62*  CALCIUM 8.3* 7.6*  --  7.4*  MG  --   --   --  1.8  PHOS  --   --   --  3.9   GFR: Estimated Creatinine Clearance: 47.5 mL/min (Jazzlyn Huizenga) (by C-G formula based on SCr of 1.62 mg/dL (H)). Liver Function Tests: Recent Labs  Lab 04/02/19 1740 04/03/19 1620 04/04/19 0304  AST 25 121* 109*  ALT 23 38 34  ALKPHOS 44 43 32*  BILITOT 1.7* 1.5* 1.0  PROT 6.1* 5.6* 4.7*  ALBUMIN 3.3* 3.0* 2.4*   No results for input(s): LIPASE, AMYLASE in the last 168 hours. Recent Labs  Lab 04/03/19 1620  AMMONIA 24   Coagulation Profile: Recent Labs  Lab 04/03/19 1620  INR 1.4*   Cardiac Enzymes: No results for input(s): CKTOTAL, CKMB, CKMBINDEX, TROPONINI in the last 168 hours. BNP (last 3 results) No results for input(s): PROBNP in the last 8760 hours. HbA1C: Recent Labs    04/04/19 0304  HGBA1C 5.8*   CBG: Recent Labs  Lab 04/03/19 2154 04/04/19 0907  GLUCAP 138* 124*   Lipid Profile: No results for input(s): CHOL, HDL, LDLCALC, TRIG, CHOLHDL, LDLDIRECT in the last 72 hours. Thyroid Function Tests: Recent Labs    04/03/19 1620  TSH 1.448   Anemia Panel: Recent  Labs    04/03/19 0916 04/03/19 1620 04/04/19 0304  VITAMINB12  --  198  --   FOLATE  --  20.4  --   FERRITIN 405*  --  376*   Sepsis Labs: Recent Labs  Lab 04/03/19 0925 04/03/19 1620 04/04/19 0304  PROCALCITON  --  7.31 6.40  LATICACIDVEN 2.8* 1.4  --     Recent Results (from the past 240 hour(s))  Novel Coronavirus, NAA ([facility])     Status: Abnormal   Collection Time: 03/27/19  2:27 PM   Specimen: Nasopharyngeal(NP) swabs in vial transport medium   NASOPHARYNGE  TESTING  Result Value Ref Range Status   SARS-CoV-2, NAA Detected ([facility]ordan) Not Detected Final    Comment: Testing was performed using the cobas(R) SARS-CoV-2 test. This nucleic acid amplification test was developed and its performance characteristics determined by [employer]. Nucleic acid amplification tests include RT-PCR and TMA. This test has not been FDA cleared or approved. This test has been authorized by FDA under an Emergency Use Authorization (EUA). This test is only authorized for the duration of time the declaration that circumstances exist justifying the authorization of the emergency use of in vitro diagnostic tests for detection of SARS-CoV-2 virus and/or diagnosis of COVID-19 infection under section 564(b)(1) of the Act, 21 U.S.C. 132GMW-1(U) (1), unless the authorization is terminated or revoked sooner. When diagnostic testing is negative, the possibility of Remington Highbaugh false negative result should be considered in the context of Ferman Basilio patient's recent exposures and the presence of clinical signs and symptoms consistent with COVID-19. An individual without sympto ms of COVID-19 and who is not shedding SARS-CoV-2 virus would expect to have Edgel Degnan negative (not detected) result in this assay.   Culture, blood (routine x 2)     Status: None (Preliminary result)   Collection Time: 04/03/19  9:10 AM   Specimen: BLOOD  Result Value Ref Range Status   Specimen Description BLOOD SITE NOT SPECIFIED  Final    Special Requests   Final    BOTTLES DRAWN AEROBIC AND  ANAEROBIC Blood Culture adequate volume   Culture   Final    NO GROWTH < 12 HOURS Performed at [facility] [address], [place], [place] [address]    Report Status PENDING  Incomplete  Culture, blood (routine x 2)     Status: None (Preliminary result)   Collection Time: 04/03/19  9:15 AM   Specimen: BLOOD RIGHT HAND  Result Value Ref Range Status   Specimen Description BLOOD RIGHT HAND  Final   Special Requests   Final    BOTTLES DRAWN AEROBIC AND ANAEROBIC Blood Culture adequate volume   Culture   Final    NO GROWTH < 12 HOURS Performed at [facility], [place] [address], [place], [place] [address]    Report Status PENDING  Incomplete         Radiology Studies: DG Chest 2 View  Result Date: 04/02/2019 CLINICAL DATA:  74 year old male with positive COVID-19 with worsening fatigue and weakness. EXAM: CHEST - 2 VIEW COMPARISON:  Chest radiograph dated 10/20/2017. FINDINGS: Bilateral diffuse hazy and streaky densities most consistent with multifocal pneumonia and in keeping with COVID-19. No lobar consolidation, pleural effusion, pneumothorax. Mild cardiomegaly. Atherosclerotic calcification of the aorta. No acute osseous pathology. Upper abdominal surgical clips. IMPRESSION: Multifocal pneumonia. Clinical correlation and follow-up to resolution recommended. Electronically Signed   By: Anner Crete M.D.   On: 04/02/2019 18:37   CT ANGIO CHEST PE W OR WO CONTRAST  Result Date: 04/03/2019 CLINICAL DATA:  Hypoxemia. COVID-19 infection. EXAM: CT ANGIOGRAPHY CHEST WITH CONTRAST TECHNIQUE: Multidetector CT imaging of the chest was performed using the standard protocol during bolus administration of intravenous contrast. Multiplanar CT image reconstructions and MIPs were obtained to evaluate the vascular anatomy. CONTRAST:  15m OMNIPAQUE IOHEXOL 350 MG/ML SOLN COMPARISON:  CT 01/01/2018. Radiographs 04/03/2019 and  04/02/2019. FINDINGS: Cardiovascular: The pulmonary arteries are well opacified with contrast to the level of the subsegmental branches. Subsegmental branch valuation is mildly limited by breathing artifact. No evidence of acute pulmonary embolism. There is atherosclerosis of the aorta, great vessels and coronary arteries. The heart size is normal. There is no pericardial effusion. Mediastinum/Nodes: There are no enlarged mediastinal, hilar or axillary lymph nodes. The thyroid gland, trachea and esophagus demonstrate no significant findings. Lungs/Pleura: No pleural effusion or pneumothorax. Multifocal ground-glass and airspace opacities are present peripherally in both lungs consistent with viral pneumonia. In addition, there are mild dependent opacities in both lower lobes which are likely due to atelectasis. No endobronchial lesion identified. Upper abdomen: No acute findings are seen within the visualized upper abdomen. There are postsurgical changes within the stomach consistent with gastric bypass. Possible mild dilatation of the ventral pancreatic duct in the pancreatic head (image 155/4), similar to previous study. Musculoskeletal/Chest wall: There is no chest wall mass or suspicious osseous finding. Review of the MIP images confirms the above findings. IMPRESSION: 1. No evidence of acute pulmonary embolism. 2. Multifocal ground-glass and airspace opacities peripherally in both lungs consistent with viral pneumonia. 3. Coronary and aortic Atherosclerosis (ICD10-I70.0). Electronically Signed   By: WRichardean SaleM.D.   On: 04/03/2019 17:36   DG Chest Port 1 View  Result Date: 04/03/2019 CLINICAL DATA:  Shortness of breath in Josiephine Simao patient who is COVID-19 positive. EXAM: PORTABLE CHEST 1 VIEW COMPARISON:  PA and lateral chest 04/02/2019 and 05/01/2017. FINDINGS: Patchy bilateral airspace disease seen on the most recent examination persists without notable change. Heart size is normal. No pneumothorax or  pleural fluid. No  acute or focal bony abnormality. IMPRESSION: No change of patchy bilateral airspace disease consistent with pneumonia. Electronically Signed   By: Thomas  Dalessio M.D.   On: 04/03/2019 10:14        Scheduled Meds: . albuterol  2 puff Inhalation Q6H  . vitamin C  500 mg Oral Daily  . aspirin EC  81 mg Oral Daily  . insulin aspart  0-5 Units Subcutaneous QHS  . insulin aspart  0-9 Units Subcutaneous TID WC  . isosorbide mononitrate  120 mg Oral Daily  . methylPREDNISolone (SOLU-MEDROL) injection  50 mg Intravenous Q12H  . misoprostol  100 mcg Oral Q lunch  . pantoprazole (PROTONIX) IV  40 mg Intravenous Q12H  . ranolazine  500 mg Oral BID  . sodium chloride flush  3 mL Intravenous Q12H  . vancomycin variable dose per unstable renal function (pharmacist dosing)   Does not apply See admin instructions  . zinc sulfate  220 mg Oral Daily   Continuous Infusions: . ceFEPime (MAXIPIME) IV 2 g (04/04/19 0837)  . lactated ringers 125 mL/hr at 04/04/19 0436  . remdesivir 100 mg in NS 100 mL 100 mg (04/04/19 0916)     LOS: 1 day    Time spent: over 30 min    Caldwell Powell, MD [facility]   To contact the attending provider between 7A-7P or the covering provider during after hours 7P-7A, please log into the web site [email or web address] and access using universal [place] password for that web site. If you do not have the password, please call the hospital operator.  04/04/2019, 11:02 AM    

## 2019-04-04 NOTE — Progress Notes (Signed)
Pharmacy Antibiotic Note  Edward Mejia is a 74 y.o. male admitted on 04/03/2019 with acute respiratory failure and fever.  Pharmacy has been consulted for cefepime and vancomyin dosing.  PCT 7.31 > 6.4 SCr elevated but improving (Baseline SCr 1 > 1.9 > 1.6) UOP 250 mL/ 24 hrs documented.  Plan: Continue Cefepime 2g IV q 12 hours.  Vancomycin dosing per levels -F/U SCr, UOP -F/U cultures, MRSA PCR, plans for abx  Height: 6' 0.01" (182.9 cm) Weight: 205 lb 11 oz (93.3 kg) IBW/kg (Calculated) : 77.62  Temp (24hrs), Avg:99.2 F (37.3 C), Min:97.8 F (36.6 C), Max:102.9 F (39.4 C)  Recent Labs  Lab 04/02/19 1740 04/03/19 0925 04/03/19 1620 04/04/19 0304  WBC 6.0  --  8.1 4.7  CREATININE 1.12  --  1.91* 1.62*  LATICACIDVEN  --  2.8* 1.4  --     Estimated Creatinine Clearance: 47.5 mL/min (A) (by C-G formula based on SCr of 1.62 mg/dL (H)).    Allergies  Allergen Reactions  . Pravachol [Pravastatin] Other (See Comments)    Fatigue    Antimicrobials this admission: Remdesivir 2/24 >> (2/28) 2/24 vanc>> 2/24 cefepime>>  Dose adjustments this admission: 2/25 22:00 Vanc random level (24 hrs after loading dose)  Microbiology results: 2/24BCx: NGTD 2/24 UCx:  2/25 MRSA PCR:   Thank you for allowing pharmacy to be a part of this patient's care.  Gretta Arab PharmD, BCPS Clinical pharmacist phone 7am- 5pm: [phone] 04/04/2019 8:15 AM

## 2019-04-05 LAB — GLUCOSE, CAPILLARY
Glucose-Capillary: 117 mg/dL — ABNORMAL HIGH (ref 70–99)
Glucose-Capillary: 131 mg/dL — ABNORMAL HIGH (ref 70–99)
Glucose-Capillary: 119 mg/dL — ABNORMAL HIGH (ref 70–99)
Glucose-Capillary: 141 mg/dL — ABNORMAL HIGH (ref 70–99)
Glucose-Capillary: 130 mg/dL — ABNORMAL HIGH (ref 70–99)

## 2019-04-05 LAB — CBC WITH DIFFERENTIAL/PLATELET
Abs Immature Granulocytes: 0.1 10*3/uL — ABNORMAL HIGH (ref 0.00–0.07)
Basophils Absolute: 0 10*3/uL (ref 0.0–0.1)
Basophils Relative: 0 %
Eosinophils Absolute: 0 10*3/uL (ref 0.0–0.5)
Eosinophils Relative: 0 %
HCT: 47.6 % (ref 39.0–52.0)
Hemoglobin: 15.7 g/dL (ref 13.0–17.0)
Immature Granulocytes: 1 %
Lymphocytes Relative: 15 %
Lymphs Abs: 1.3 10*3/uL (ref 0.7–4.0)
MCH: 28 pg (ref 26.0–34.0)
MCHC: 33 g/dL (ref 30.0–36.0)
MCV: 85 fL (ref 80.0–100.0)
Monocytes Absolute: 0.5 10*3/uL (ref 0.1–1.0)
Monocytes Relative: 6 %
Neutro Abs: 6.5 10*3/uL (ref 1.7–7.7)
Neutrophils Relative %: 78 %
Platelets: 336 10*3/uL (ref 150–400)
RBC: 5.6 MIL/uL (ref 4.22–5.81)
RDW: 15.2 % (ref 11.5–15.5)
WBC: 8.5 10*3/uL (ref 4.0–10.5)
nRBC: 0 % (ref 0.0–0.2)

## 2019-04-05 LAB — COMPREHENSIVE METABOLIC PANEL
ALT: 41 U/L (ref 0–44)
AST: 101 U/L — ABNORMAL HIGH (ref 15–41)
Albumin: 2.6 g/dL — ABNORMAL LOW (ref 3.5–5.0)
Alkaline Phosphatase: 31 U/L — ABNORMAL LOW (ref 38–126)
Anion gap: 10 (ref 5–15)
BUN: 41 mg/dL — ABNORMAL HIGH (ref 8–23)
CO2: 20 mmol/L — ABNORMAL LOW (ref 22–32)
Calcium: 7.6 mg/dL — ABNORMAL LOW (ref 8.9–10.3)
Chloride: 106 mmol/L (ref 98–111)
Creatinine, Ser: 1.39 mg/dL — ABNORMAL HIGH (ref 0.61–1.24)
GFR calc Af Amer: 57 mL/min — ABNORMAL LOW (ref >60)
GFR calc non Af Amer: 50 mL/min — ABNORMAL LOW (ref >60)
Glucose, Bld: 133 mg/dL — ABNORMAL HIGH (ref 70–99)
Potassium: 3.9 mmol/L (ref 3.5–5.1)
Sodium: 136 mmol/L (ref 135–145)
Total Bilirubin: 0.9 mg/dL (ref 0.3–1.2)
Total Protein: 4.9 g/dL — ABNORMAL LOW (ref 6.5–8.1)

## 2019-04-05 LAB — PHOSPHORUS: Phosphorus: 3.6 mg/dL (ref 2.5–4.6)

## 2019-04-05 LAB — C-REACTIVE PROTEIN: CRP: 10.8 mg/dL — ABNORMAL HIGH (ref <1.0)

## 2019-04-05 LAB — HEMOGLOBIN AND HEMATOCRIT, BLOOD
HCT: 52.6 % — ABNORMAL HIGH (ref 39.0–52.0)
Hemoglobin: 17.8 g/dL — ABNORMAL HIGH (ref 13.0–17.0)

## 2019-04-05 LAB — METHYLMALONIC ACID, SERUM: Methylmalonic Acid, Quantitative: 317 nmol/L (ref 0–378)

## 2019-04-05 LAB — D-DIMER, QUANTITATIVE: D-Dimer, Quant: 1.85 ug/mL-FEU — ABNORMAL HIGH (ref 0.00–0.50)

## 2019-04-05 LAB — MAGNESIUM: Magnesium: 2 mg/dL (ref 1.7–2.4)

## 2019-04-05 LAB — FERRITIN: Ferritin: 307 ng/mL (ref 24–336)

## 2019-04-05 LAB — PROCALCITONIN: Procalcitonin: 5.59 ng/mL

## 2019-04-05 MED ORDER — ENSURE ENLIVE PO LIQD
237.0000 mL | Freq: Three times a day (TID) | ORAL | Status: DC
  Administered 2019-04-05 – 2019-04-07 (×7): 237 mL via ORAL

## 2019-04-05 MED ORDER — BOOST / RESOURCE BREEZE PO LIQD CUSTOM
1.0000 | Freq: Three times a day (TID) | ORAL | Status: DC
  Filled 2019-04-05 (×2): qty 1

## 2019-04-05 MED ORDER — PANTOPRAZOLE SODIUM 40 MG PO TBEC
40.0000 mg | DELAYED_RELEASE_TABLET | Freq: Two times a day (BID) | ORAL | Status: DC
  Administered 2019-04-05 – 2019-04-06 (×3): 40 mg via ORAL
  Filled 2019-04-05 (×3): qty 1

## 2019-04-05 NOTE — Progress Notes (Addendum)
PHYSICAL THERAPY EVALUATION  HISTORY OF ILLNESS: 74 y.o. male with medical history significant of hypertension, PVD, cellulitis, cancer, brain disorder-amaurosis fugax, remote history of rheumatic fever, arthritis, and diagnosed with COVID-19 on 2/17.  He presents with complaints of worsening shortness of breath and fatigue.  Patient had received the bamlanivimab infusion on 2/19. Pt was d/c home as 02 on room air was 100%. Returned to hospital with severe SOB.  CLINICAL IMPRESSION: Pt admitted with above diagnosis. PTA he reports was quite independent, able to do his own bathing and dressing etc, he denies using AD. States could not use cane as it would hurt his back from the asymmetrical support and he couldn't use a walker as he would have to bend over the walker.  Pt currently with functional limitations due to the deficits listed below (see PT Problem List). This pm pt is still quite confused, needing continue cues and instructions for safety. He was able to ambulate to rest room and pt had multi losses of balance needing mod a to recover. Pt was given walker (raised so he would not have to lean over it) pt did much better with balance having walker. Pt was on room air and pt managed to keep his 02 sats in 90s throughout session. Pt will benefit from skilled PT to increase their independence and safety with mobility to allow discharge to the venue listed below.     D/C RECOMMENDATION: Pt states that he was quite independent at home, he does show some weakness and balance deficits, with some education and training on walker he may be able to return home to spouse with home health PT.    04/05/19 1218  PT Visit Information  Last PT Received On 04/05/19  Assistance Needed +1  History of Present Illness 74 y.o. male with medical history significant of hypertension, PVD, cellulitis, cancer, brain disorder-amaurosis fugax, remote history of rheumatic fever, arthritis, and diagnosed with COVID-19 on  2/17.  He presents with complaints of worsening shortness of breath and fatigue.  Patient had received the bamlanivimab infusion on 2/19. Pt was d/c home as 02 on room air was 100%. Returned to hospital with severe SOB.  Subjective Data  Patient Stated Goal quite confused gives long convoluted hx  Precautions  Precautions Fall  Precaution Comments cognition  Restrictions  Weight Bearing Restrictions No  Pain Assessment  Pain Assessment No/denies pain  Cognition  Arousal/Alertness Awake/alert  Behavior During Therapy Flat affect  Overall Cognitive Status No family/caregiver present to determine baseline cognitive functioning  General Comments Pt needing increased cues verbal and also tactile  Bed Mobility  General bed mobility comments pt sitting in recliner at therapist arrival  Transfers  Overall transfer level Needs assistance  Equipment used  (started with no AD but used walker by end)  Transfers Sit to/from Stand  Sit to Stand Min assist;Min guard  General transfer comment stands from recliner and also low commode  Ambulation/Gait  Ambulation/Gait assistance Mod assist  Gait Distance (Feet) 60 Feet  Assistive device  (initially with no ad then w/ RW)  Gait Pattern/deviations Scissoring;Shuffle;Ataxic;Narrow base of support;Drifts right/left;Staggering right;Staggering left  General Gait Details gait pattern is severely mixed pt noted to be scissoring, shuffling at one point tripped over his socks, then over him left foot. He had reported he couldnt use a cen because the asymetry hurt his back, he couldnt use a walker because he had to lean over it. Pt was given walker elevated as to keep him as  upright was possible, and pt did much better with ambulation although still needing min/mod a for safety  Gait velocity decreased  Balance  Overall balance assessment Needs assistance  Sitting-balance support Feet supported  Sitting balance-Leahy Scale Fair  Standing balance support  During functional activity;Bilateral upper extremity supported  Standing balance-Leahy Scale Poor  General Comments  General comments (skin integrity, edema, etc.) pt was on room air throughout session and managed to keep sats in 90s  PT - End of Session  Equipment Utilized During Treatment Gait belt  Activity Tolerance Patient limited by fatigue;Patient limited by lethargy;Treatment limited secondary to agitation  Patient left in chair;with call bell/phone within reach;with chair alarm set   PT - Assessment/Plan  PT Visit Diagnosis Other abnormalities of gait and mobility (R26.89);Muscle weakness (generalized) (M62.81);Unsteadiness on feet (R26.81)  PT Frequency (ACUTE ONLY) Min 3X/week  Follow Up Recommendations Home health PT  PT equipment Rolling walker with 5" wheels  AM-PAC PT "6 Clicks" Mobility Outcome Measure (Version 2)  Help needed turning from your back to your side while in a flat bed without using bedrails? 3  Help needed moving from lying on your back to sitting on the side of a flat bed without using bedrails? 3  Help needed moving to and from a bed to a chair (including a wheelchair)? 3  Help needed standing up from a chair using your arms (e.g., wheelchair or bedside chair)? 3  Help needed to walk in hospital room? 2  Help needed climbing 3-5 steps with a railing?  2  6 Click Score 16  Consider Recommendation of Discharge To: Home with [facility]  Acute Rehab PT Goals  Time For Goal Achievement 04/19/19  Potential to Achieve Goals Fair  PT Time Calculation  PT Start Time (ACUTE ONLY) 1154  PT Stop Time (ACUTE ONLY) 1218  PT Time Calculation (min) (ACUTE ONLY) 24 min  PT General Charges  $$ ACUTE PT VISIT 1 Visit  PT Evaluation  $PT Eval Moderate Complexity 1 Mod  PT Treatments  $Therapeutic Activity 8-22 mins   Horald Chestnut, PT

## 2019-04-05 NOTE — Progress Notes (Signed)
Spoke with [name], the patient's spouse, and answered all questions at this time.

## 2019-04-05 NOTE — Progress Notes (Signed)
PROGRESS NOTE    Edward Mejia  WNU:[id number] DOB: 1945/11/11 DOA: 04/03/2019 PCP: Birdie Sons, MD   Brief Narrative:  Edward Mejia is Edward Mejia 74 y.o. male with medical history significant of hypertension, PVD, cellulitis, remote history of rheumatic fever, arthritis, and diagnosed with COVID-19 on 2/17.  He presents with complaints of worsening shortness of breath and fatigue.  Patient had received the bamlanivimab infusion on 2/19.  He reports having Edward Mejia persistent dry cough.  He has not been eating or drinking anything and wife estimates that he has had 16 ounces of Gatorade over the last few days. At home he had Edward Mejia fever up to 102.4 F that persisted throughout most of the day yesterday despite utilizing Tylenol.  He was taken to [facility] and given IV fluids, but was ultimately discharged home as he was maintaining O2 saturations on room air..  Chest x-ray noted is of Edward Mejia multifocal pneumonia. Since returning home his wife notes that he is been more weak, and was unable to stand without assistance.  This morning he spent approximately 2 hours on the commode due to his symptoms and had Edward Mejia loose bowel movement.  Wife also notes that due to his shortness of breath he has unable to talk.  Patient denied having any nausea, vomiting, or chest pain.   Assessment & Plan:   Principal Problem:   Pneumonia due to COVID-19 virus Active Problems:   Psoriatic arthritis ([place])   Benign essential HTN   PVD (peripheral vascular disease) ([place])  Acute Hypoxic Resp Failure 2/2 COVID 19  Community Acquired Pneumonia O2 requirement improved today, on RA S/p Bamlanivimab on 2/19 Continue steroids/remdesivir Follow blood cx (NGTD) and procalcitonin (elevated to 7.3 on presentation, downtrending) I/O, daily weights IS, OOB, prone as able Elevated d dimer, CT PE protocol negative for PE, LE dopplers without evidence of DVT UA not c/w UTI with negative LE, nitrite - + RBC's and WBC's ->  Blood cx NGTD, urine cx with insignificant growth MRSA PCR negative Continue cefepime   Elevated D Dimer CT PE negative for PE LE dopplers negative D dimer downtrending  Concern for GI bleed: nighttime RN reportedly noted blood mixed with stool on 1 occasion on 2/24-25.  This was again reported 2/25-26.  Nursing again this morning did not note any stool with BM.  Will continue to monitor at this time.   Trend H/H -> repeat this PM Continue PPI BID IV Consider GI c/s   Acute Metabolic Encephalopathy Suspect this is 2/2 COVID infection Improving today, Vidit Boissonneault&Ox3 Hold potentially sedating meds (gabapentin, narcotics - home norco) Follow ammonia (wnl) and VBG (not done, but with improvement will ctm).  B12 (low normal, follow MMA), folate (wnl), TSH (wnl), urinalysis (as above), blood cultures (as above). Delirium precautions Further w/u as appropriate -> consider imaging if worsening or not improving  AKI  NAGMA: improving, likely 2/2 hypovolemia and poor intake.  NAGMA likely 2/2 AKI.  Continue to monitor, improving.    Psoriatic Arthritis On enbrel weekly Also, prednisone 7.5 mg daily (steroids increased as noted above)  PVD  CAD: continue ranexa, imdur   HTN: hold arb/thiazide for now  PUD: continue misoprostol, H2 blocker - wife notes last time he had GI bleed/admission for ulcer was 2015 (caution with anticoagulation)  DVT prophylaxis: SCD Code Status: DNR Family Communication: none at bedside - wife over phone Disposition Plan:  . Patient came from: home           .  Anticipated d/c place: home . Barriers to d/c OR conditions which need to be met to effect Edward Mejia safe d/c: pending improvement in acute metabolic encephalopathy, completion of remdesivir, therapy evaluation   Consultants:   none  Procedures:  none  Antimicrobials:  Anti-infectives (From admission, onward)   Start     Dose/Rate Route Frequency Ordered Stop   04/04/19 2330  vancomycin (VANCOREADY) IVPB  1500 mg/300 mL     1,500 mg 150 mL/hr over 120 Minutes Intravenous  Once 04/04/19 2309 04/05/19 0130   04/04/19 1000  remdesivir 100 mg in sodium chloride 0.9 % 100 mL IVPB     100 mg 200 mL/hr over 30 Minutes Intravenous Daily 04/03/19 1136 04/08/19 0959   04/03/19 2200  vancomycin (VANCOREADY) IVPB 2000 mg/400 mL     2,000 mg 200 mL/hr over 120 Minutes Intravenous  Once 04/03/19 2059 04/04/19 0002   04/03/19 1900  ceFEPIme (MAXIPIME) 2 g in sodium chloride 0.9 % 100 mL IVPB     2 g 200 mL/hr over 30 Minutes Intravenous Every 12 hours 04/03/19 1814     04/03/19 1900  vancomycin (VANCOREADY) IVPB 2000 mg/400 mL  Status:  Discontinued     2,000 mg 200 mL/hr over 120 Minutes Intravenous  Once 04/03/19 1814 04/03/19 2102   04/03/19 1840  vancomycin variable dose per unstable renal function (pharmacist dosing)  Status:  Discontinued      Does not apply See admin instructions 04/03/19 1840 04/05/19 0742   04/03/19 1200  remdesivir 200 mg in sodium chloride 0.9% 250 mL IVPB     200 mg 580 mL/hr over 30 Minutes Intravenous Once 04/03/19 1136 04/03/19 1504      Subjective: Feels progressively better Edward Mejia&Ox3  Objective: Vitals:   04/04/19 1920 04/04/19 2323 04/05/19 0332 04/05/19 0722  BP: 119/72 123/77 (!) 147/86 (!) 144/84  Pulse: 64 69  66  Resp: (!) '24 20  20  '$ Temp: 98.3 F (36.8 C) 98.2 F (36.8 C) 98.7 F (37.1 C) 97.9 F (36.6 C)  TempSrc: Oral Oral Axillary Oral  SpO2: 98% 100% 99% 98%  Weight:      Height:        Intake/Output Summary (Last 24 hours) at 04/05/2019 1132 Last data filed at 04/05/2019 0300 Gross per 24 hour  Intake 2962.83 ml  Output 300 ml  Net 2662.83 ml   Filed Weights   04/03/19 1653  Weight: 93.3 kg    Examination:  General: No acute distress. Cardiovascular: Heart sounds show Cruzita Lipa regular rate, and rhythm. Lungs: Clear to auscultation bilaterally Abdomen: Soft, nontender, nondistended  Neurological: Alert and oriented 3. Moves all extremities  4 . Cranial nerves II through XII grossly intact. Skin: Warm and dry. No rashes or lesions. Extremities: No clubbing or cyanosis. No edema.   Data Reviewed: I have personally reviewed following labs and imaging studies  CBC: Recent Labs  Lab 04/02/19 1740 04/02/19 1740 04/03/19 1620 04/03/19 1637 04/04/19 0304 04/04/19 1335 04/05/19 0303  WBC 6.0  --  8.1  --  4.7  --  8.5  NEUTROABS 3.7  --   --   --  3.1  --  6.5  HGB 18.8*   < > 18.8* 19.4* 16.6 17.9* 15.7  HCT 55.5*   < > 55.5* 57.0* 50.7 53.2* 47.6  MCV 83.7  --  84.3  --  86.2  --  85.0  PLT 347  --  265  --  269  --  336   < > =  values in this interval not displayed.   Basic Metabolic Panel: Recent Labs  Lab 04/02/19 1740 04/03/19 1620 04/03/19 1637 04/04/19 0304 04/05/19 0303  NA 129* 132* 134* 135 136  K 4.3 4.3 4.1 4.4 3.9  CL 96* 104  --  106 106  CO2 27 15*  --  15* 20*  GLUCOSE 117* 147*  --  131* 133*  BUN 15 31*  --  32* 41*  CREATININE 1.12 1.91*  --  1.62* 1.39*  CALCIUM 8.3* 7.6*  --  7.4* 7.6*  MG  --   --   --  1.8 2.0  PHOS  --   --   --  3.9 3.6   GFR: Estimated Creatinine Clearance: 55.3 mL/min (Kaiden Pech) (by C-G formula based on SCr of 1.39 mg/dL (H)). Liver Function Tests: Recent Labs  Lab 04/02/19 1740 04/03/19 1620 04/04/19 0304 04/05/19 0303  AST 25 121* 109* 101*  ALT 23 38 34 41  ALKPHOS 44 43 32* 31*  BILITOT 1.7* 1.5* 1.0 0.9  PROT 6.1* 5.6* 4.7* 4.9*  ALBUMIN 3.3* 3.0* 2.4* 2.6*   No results for input(s): LIPASE, AMYLASE in the last 168 hours. Recent Labs  Lab 04/03/19 1620  AMMONIA 24   Coagulation Profile: Recent Labs  Lab 04/03/19 1620  INR 1.4*   Cardiac Enzymes: No results for input(s): CKTOTAL, CKMB, CKMBINDEX, TROPONINI in the last 168 hours. BNP (last 3 results) No results for input(s): PROBNP in the last 8760 hours. HbA1C: Recent Labs    04/04/19 0304  HGBA1C 5.8*   CBG: Recent Labs  Lab 04/04/19 0907 04/04/19 1158 04/04/19 1652 04/04/19 2138  04/05/19 0726  GLUCAP 124* 87 116* 119* 117*   Lipid Profile: No results for input(s): CHOL, HDL, LDLCALC, TRIG, CHOLHDL, LDLDIRECT in the last 72 hours. Thyroid Function Tests: Recent Labs    04/03/19 1620  TSH 1.448   Anemia Panel: Recent Labs    04/03/19 0916 04/03/19 1620 04/04/19 0304 04/05/19 0303  VITAMINB12  --  198  --   --   FOLATE  --  20.4  --   --   FERRITIN   < >  --  376* 307   < > = values in this interval not displayed.   Sepsis Labs: Recent Labs  Lab 04/03/19 0925 04/03/19 1620 04/04/19 0304 04/05/19 0303  PROCALCITON  --  7.31 6.40 5.59  LATICACIDVEN 2.8* 1.4  --   --     Recent Results (from the past 240 hour(s))  Novel Coronavirus, NAA ([facility])     Status: Abnormal   Collection Time: 03/27/19  2:27 PM   Specimen: Nasopharyngeal(NP) swabs in vial transport medium   NASOPHARYNGE  TESTING  Result Value Ref Range Status   SARS-CoV-2, NAA Detected (Kimberlynn Lumbra) Not Detected Final    Comment: Testing was performed using the cobas(R) SARS-CoV-2 test. This nucleic acid amplification test was developed and its performance characteristics determined by [employer]. Nucleic acid amplification tests include RT-PCR and TMA. This test has not been FDA cleared or approved. This test has been authorized by FDA under an Emergency Use Authorization (EUA). This test is only authorized for the duration of time the declaration that circumstances exist justifying the authorization of the emergency use of in vitro diagnostic tests for detection of SARS-CoV-2 virus and/or diagnosis of COVID-19 infection under section 564(b)(1) of the Act, 21 U.S.C. 202RKY-7(C) (1), unless the authorization is terminated or revoked sooner. When diagnostic testing is negative, the possibility of Radie Berges false  negative result should be considered in the context of Kejon Feild patient's recent exposures and the presence of clinical signs and symptoms consistent with COVID-19. An individual without  sympto ms of COVID-19 and who is not shedding SARS-CoV-2 virus would expect to have Caylen Yardley negative (not detected) result in this assay.   Culture, blood (routine x 2)     Status: None (Preliminary result)   Collection Time: 04/03/19  9:10 AM   Specimen: BLOOD  Result Value Ref Range Status   Specimen Description BLOOD SITE NOT SPECIFIED  Final   Special Requests   Final    BOTTLES DRAWN AEROBIC AND ANAEROBIC Blood Culture adequate volume Performed at [facility], [place] [address], [place], [place] [address]    Culture NO GROWTH 1 DAY  Final   Report Status PENDING  Incomplete  Culture, blood (routine x 2)     Status: None (Preliminary result)   Collection Time: 04/03/19  9:15 AM   Specimen: BLOOD RIGHT HAND  Result Value Ref Range Status   Specimen Description BLOOD RIGHT HAND  Final   Special Requests   Final    BOTTLES DRAWN AEROBIC AND ANAEROBIC Blood Culture adequate volume Performed at [facility], [place] [address] [place], [place] [address]    Culture NO GROWTH 1 DAY  Final   Report Status PENDING  Incomplete  Culture, Urine     Status: Abnormal   Collection Time: 04/03/19 10:03 PM   Specimen: Urine  Result Value Ref Range Status   Specimen Description   Final    Urine Performed at [place] [address], [place], [place] [address]    Special Requests   Final    NONE Performed at [facility], [place] [name] [place], [place] [address]    Culture (Iridiana Fonner)  Final    <10,000 COLONIES/mL INSIGNIFICANT GROWTH Performed at [place] [address] [place], [place] [address]    Report Status 04/04/2019 FINAL  Final  MRSA PCR Screening     Status: None   Collection Time: 04/04/19  8:09 AM   Specimen: Nasopharyngeal  Result Value Ref Range Status   MRSA by PCR NEGATIVE NEGATIVE Final    Comment:        The GeneXpert MRSA Assay (FDA approved for NASAL specimens only), is one component of Keandria Berrocal comprehensive MRSA  colonization surveillance program. It is not intended to diagnose MRSA infection nor to guide or monitor treatment for MRSA infections. Performed at [facility], [place] [address], [place], [place] [address]          Radiology Studies: CT ANGIO CHEST PE W OR WO CONTRAST  Result Date: 04/03/2019 CLINICAL DATA:  Hypoxemia. COVID-19 infection. EXAM: CT ANGIOGRAPHY CHEST WITH CONTRAST TECHNIQUE: Multidetector CT imaging of the chest was performed using the standard protocol during bolus administration of intravenous contrast. Multiplanar CT image reconstructions and MIPs were obtained to evaluate the vascular anatomy. CONTRAST:  40m OMNIPAQUE IOHEXOL 350 MG/ML SOLN COMPARISON:  CT 01/01/2018. Radiographs 04/03/2019 and 04/02/2019. FINDINGS: Cardiovascular: The pulmonary arteries are well opacified with contrast to the level of the subsegmental branches. Subsegmental branch valuation is mildly limited by breathing artifact. No evidence of acute pulmonary embolism. There is atherosclerosis of the aorta, great vessels and coronary arteries. The heart size is normal. There is no pericardial effusion. Mediastinum/Nodes: There are no enlarged mediastinal, hilar or axillary lymph nodes. The thyroid gland, trachea and esophagus demonstrate no significant  findings. Lungs/Pleura: No pleural effusion or pneumothorax. Multifocal ground-glass and airspace opacities are present peripherally in both lungs consistent with viral pneumonia. In addition, there are mild dependent opacities in both lower lobes which are likely due to atelectasis. No endobronchial lesion identified. Upper abdomen: No acute findings are seen within the visualized upper abdomen. There are postsurgical changes within the stomach consistent with gastric bypass. Possible mild dilatation of the ventral pancreatic duct in the pancreatic head (image 155/4), similar to previous study. Musculoskeletal/Chest wall: There is no chest  wall mass or suspicious osseous finding. Review of the MIP images confirms the above findings. IMPRESSION: 1. No evidence of acute pulmonary embolism. 2. Multifocal ground-glass and airspace opacities peripherally in both lungs consistent with viral pneumonia. 3. Coronary and aortic Atherosclerosis (ICD10-I70.0). Electronically Signed   By: Richardean Sale M.D.   On: 04/03/2019 17:36   VAS [place] LOWER EXTREMITY VENOUS (DVT)  Result Date: 04/04/2019  Lower Venous DVTStudy Indications: Elevated ddimer.  Comparison Study: no prior Performing Technologist: Abram Sander RVS  Examination Guidelines: Evvie Behrmann complete evaluation includes B-mode imaging, spectral Doppler, color Doppler, and power Doppler as needed of all accessible portions of each vessel. Bilateral testing is considered an integral part of Yehonatan Grandison complete examination. Limited examinations for reoccurring indications may be performed as noted. The reflux portion of the exam is performed with the patient in reverse Trendelenburg.  +---------+---------------+---------+-----------+----------+--------------+ RIGHT    CompressibilityPhasicitySpontaneityPropertiesThrombus Aging +---------+---------------+---------+-----------+----------+--------------+ CFV      Full           Yes      Yes                                 +---------+---------------+---------+-----------+----------+--------------+ SFJ      Full                                                        +---------+---------------+---------+-----------+----------+--------------+ FV Prox  Full                                                        +---------+---------------+---------+-----------+----------+--------------+ FV Mid   Full                                                        +---------+---------------+---------+-----------+----------+--------------+ FV DistalFull                                                         +---------+---------------+---------+-----------+----------+--------------+ PFV      Full                                                        +---------+---------------+---------+-----------+----------+--------------+  POP      Full           Yes      Yes                                 +---------+---------------+---------+-----------+----------+--------------+ PTV      Full                                                        +---------+---------------+---------+-----------+----------+--------------+ PERO     Full                                                        +---------+---------------+---------+-----------+----------+--------------+   +---------+---------------+---------+-----------+----------+--------------+ LEFT     CompressibilityPhasicitySpontaneityPropertiesThrombus Aging +---------+---------------+---------+-----------+----------+--------------+ CFV      Full           Yes      Yes                                 +---------+---------------+---------+-----------+----------+--------------+ SFJ      Full                                                        +---------+---------------+---------+-----------+----------+--------------+ FV Prox  Full                                                        +---------+---------------+---------+-----------+----------+--------------+ FV Mid   Full                                                        +---------+---------------+---------+-----------+----------+--------------+ FV DistalFull                                                        +---------+---------------+---------+-----------+----------+--------------+ PFV      Full                                                        +---------+---------------+---------+-----------+----------+--------------+ POP      Full           Yes      Yes                                  +---------+---------------+---------+-----------+----------+--------------+  PTV      Full                                                        +---------+---------------+---------+-----------+----------+--------------+ PERO     Full                                                        +---------+---------------+---------+-----------+----------+--------------+     Summary: BILATERAL: - No evidence of deep vein thrombosis seen in the lower extremities, bilaterally.   *See table(s) above for measurements and observations. Electronically signed by Monica Martinez MD on 04/04/2019 at 7:02:03 PM.    Final         Scheduled Meds: . albuterol  2 puff Inhalation Q6H  . vitamin C  500 mg Oral Daily  . aspirin EC  81 mg Oral Daily  . enoxaparin (LOVENOX) injection  40 mg Subcutaneous Q24H  . insulin aspart  0-5 Units Subcutaneous QHS  . insulin aspart  0-9 Units Subcutaneous TID WC  . isosorbide mononitrate  120 mg Oral Daily  . methylPREDNISolone (SOLU-MEDROL) injection  50 mg Intravenous Q12H  . misoprostol  100 mcg Oral Q lunch  . pantoprazole (PROTONIX) IV  40 mg Intravenous Q12H  . ranolazine  500 mg Oral BID  . sodium bicarbonate  650 mg Oral TID  . sodium chloride flush  3 mL Intravenous Q12H  . zinc sulfate  220 mg Oral Daily   Continuous Infusions: . ceFEPime (MAXIPIME) IV 2 g (04/05/19 1027)  . remdesivir 100 mg in NS 100 mL 100 mg (04/05/19 0952)     LOS: 2 days    Time spent: over 65 min    Fayrene Helper, MD [facility]   To contact the attending provider between 7A-7P or the covering provider during after hours 7P-7A, please log into the web site [email or web address] and access using universal [place] password for that web site. If you do not have the password, please call the hospital operator.  04/05/2019, 11:32 AM

## 2019-04-05 NOTE — Progress Notes (Signed)
Initial Nutrition Assessment RD working remotely.  DOCUMENTATION CODES:   Not applicable  INTERVENTION:    Ensure Enlive po TID, each supplement provides 350 kcal and 20 grams of protein   Pt receiving Hormel Shake daily with Breakfast which provides 520 kcals and 22 g of protein and Magic cup BID with lunch and dinner, each supplement provides 290 kcal and 9 grams of protein, automatically on meal trays to optimize nutritional intake.    NUTRITION DIAGNOSIS:   Increased nutrient needs related to acute illness, catabolic [id number] as evidenced by estimated needs.  GOAL:   Patient will meet greater than or equal to 90% of their needs  MONITOR:   PO intake, Supplement acceptance  REASON FOR ASSESSMENT:   Consult Assessment of nutrition requirement/status  ASSESSMENT:   74 yo male admitted with worsening SOB after being diagnoses with COVID-19 on 2/17. PMH includes HTN, PVD, cellulitis, rheumatic fever, arthritis.   Patient is currently on room air.  PTA, he was eating poorly, only consuming Gatorade for a few days. Intake has remained minimal since admission. Ongoing poor appetite related to COVID symptoms. Diet has been liberalized to regular to help improve intake. Boost Breeze ordered with a comment for "vanilla Boost." Vanilla Boost is not on formulary; will change to vanilla Ensure Enlive.   In the past month, patient has had a significant 6% weight loss. Suspect he has some degree of malnutrition, Unable to obtain enough information at this time for identification of  malnutrition.   Patient would benefit from nutrient dense PO supplements to help meet increased nutrition needs for COVID-19.  Labs reviewed.  CBG's: 117-131  Medications reviewed and include vitamin C, novolog, solu-medrol, zinc, remdesivir.   NUTRITION - FOCUSED PHYSICAL EXAM:  unable to complete  Diet Order:   Diet Order            Diet regular Room service appropriate? Yes; Fluid  consistency: Thin  Diet effective now              EDUCATION NEEDS:   Not appropriate for education at this time  Skin:  Skin Assessment: Reviewed RN Assessment  Last BM:  2/26 type 7  Height:   Ht Readings from Last 1 Encounters:  04/03/19 6' 0.01" (1.829 m)    Weight:   Wt Readings from Last 1 Encounters:  04/03/19 93.3 kg    Ideal Body Weight:  80.9 kg  BMI:  Body mass index is 27.89 kg/m.  Estimated Nutritional Needs:   Kcal:  2400-2600  Protein:  120-140 gm  Fluid:  >/= 2.3 L    Molli Barrows, RD, LDN, CNSC Please refer to Amion for contact information.

## 2019-04-06 LAB — MAGNESIUM: Magnesium: 2.2 mg/dL (ref 1.7–2.4)

## 2019-04-06 LAB — CBC WITH DIFFERENTIAL/PLATELET
Abs Immature Granulocytes: 0.06 10*3/uL (ref 0.00–0.07)
Basophils Absolute: 0 10*3/uL (ref 0.0–0.1)
Basophils Relative: 0 %
Eosinophils Absolute: 0 10*3/uL (ref 0.0–0.5)
Eosinophils Relative: 0 %
HCT: 46.5 % (ref 39.0–52.0)
Hemoglobin: 15.8 g/dL (ref 13.0–17.0)
Immature Granulocytes: 1 %
Lymphocytes Relative: 15 %
Lymphs Abs: 1.1 10*3/uL (ref 0.7–4.0)
MCH: 28.5 pg (ref 26.0–34.0)
MCHC: 34 g/dL (ref 30.0–36.0)
MCV: 83.8 fL (ref 80.0–100.0)
Monocytes Absolute: 0.7 10*3/uL (ref 0.1–1.0)
Monocytes Relative: 9 %
Neutro Abs: 5.5 10*3/uL (ref 1.7–7.7)
Neutrophils Relative %: 75 %
Platelets: 379 10*3/uL (ref 150–400)
RBC: 5.55 MIL/uL (ref 4.22–5.81)
RDW: 15.2 % (ref 11.5–15.5)
WBC: 7.4 10*3/uL (ref 4.0–10.5)
nRBC: 0 % (ref 0.0–0.2)

## 2019-04-06 LAB — HEMOGLOBIN AND HEMATOCRIT, BLOOD
HCT: 48.8 % (ref 39.0–52.0)
Hemoglobin: 16 g/dL (ref 13.0–17.0)

## 2019-04-06 LAB — COMPREHENSIVE METABOLIC PANEL
ALT: 42 U/L (ref 0–44)
AST: 69 U/L — ABNORMAL HIGH (ref 15–41)
Albumin: 2.7 g/dL — ABNORMAL LOW (ref 3.5–5.0)
Alkaline Phosphatase: 32 U/L — ABNORMAL LOW (ref 38–126)
Anion gap: 11 (ref 5–15)
BUN: 38 mg/dL — ABNORMAL HIGH (ref 8–23)
CO2: 19 mmol/L — ABNORMAL LOW (ref 22–32)
Calcium: 7.8 mg/dL — ABNORMAL LOW (ref 8.9–10.3)
Chloride: 106 mmol/L (ref 98–111)
Creatinine, Ser: 1.3 mg/dL — ABNORMAL HIGH (ref 0.61–1.24)
GFR calc Af Amer: >60 mL/min (ref >60)
GFR calc non Af Amer: 54 mL/min — ABNORMAL LOW (ref >60)
Glucose, Bld: 118 mg/dL — ABNORMAL HIGH (ref 70–99)
Potassium: 3.8 mmol/L (ref 3.5–5.1)
Sodium: 136 mmol/L (ref 135–145)
Total Bilirubin: 0.8 mg/dL (ref 0.3–1.2)
Total Protein: 4.9 g/dL — ABNORMAL LOW (ref 6.5–8.1)

## 2019-04-06 LAB — GLUCOSE, CAPILLARY
Glucose-Capillary: 119 mg/dL — ABNORMAL HIGH (ref 70–99)
Glucose-Capillary: 128 mg/dL — ABNORMAL HIGH (ref 70–99)
Glucose-Capillary: 116 mg/dL — ABNORMAL HIGH (ref 70–99)
Glucose-Capillary: 137 mg/dL — ABNORMAL HIGH (ref 70–99)

## 2019-04-06 LAB — FERRITIN: Ferritin: 261 ng/mL (ref 24–336)

## 2019-04-06 LAB — C-REACTIVE PROTEIN: CRP: 4.4 mg/dL — ABNORMAL HIGH (ref <1.0)

## 2019-04-06 LAB — D-DIMER, QUANTITATIVE: D-Dimer, Quant: 1.09 ug/mL-FEU — ABNORMAL HIGH (ref 0.00–0.50)

## 2019-04-06 LAB — PHOSPHORUS: Phosphorus: 2.7 mg/dL (ref 2.5–4.6)

## 2019-04-06 MED ORDER — SODIUM CHLORIDE 0.9 % IV SOLN
8.0000 mg/h | INTRAVENOUS | Status: DC
  Administered 2019-04-06 – 2019-04-07 (×2): 8 mg/h via INTRAVENOUS
  Filled 2019-04-06 (×3): qty 80

## 2019-04-06 MED ORDER — SODIUM CHLORIDE 0.9 % IV SOLN
2.0000 g | INTRAVENOUS | Status: DC
  Administered 2019-04-06: 2 g via INTRAVENOUS
  Filled 2019-04-06: qty 20

## 2019-04-06 MED ORDER — PANTOPRAZOLE SODIUM 40 MG IV SOLR: 40.0000 mg | Freq: Two times a day (BID) | INTRAVENOUS | Status: DC

## 2019-04-06 MED ORDER — HYDROCODONE-ACETAMINOPHEN 7.5-325 MG PO TABS: 1.0000 | ORAL_TABLET | Freq: Four times a day (QID) | ORAL | Status: DC | PRN

## 2019-04-06 MED ORDER — PANTOPRAZOLE SODIUM 40 MG IV SOLR
40.0000 mg | Freq: Once | INTRAVENOUS | Status: AC
  Administered 2019-04-06: 20:00:00 40 mg via INTRAVENOUS
  Filled 2019-04-06: qty 40

## 2019-04-06 NOTE — Progress Notes (Signed)
Spoke with spouse [name] and gave an update on the patient's condition.  Answered all questions at this time.

## 2019-04-06 NOTE — Progress Notes (Signed)
Physical Therapy Treatment Patient Details Name: Edward Mejia MRN: [id number] DOB: Mar 02, 1945 Today's Date: 04/06/2019    History of Present Illness 74 y.o. male with medical history significant of hypertension, PVD, cellulitis, cancer, brain disorder-amaurosis fugax, remote history of rheumatic fever, arthritis, and diagnosed with COVID-19 on 2/17.  He presents with complaints of worsening shortness of breath and fatigue.  Patient had received the bamlanivimab infusion on 2/19. Pt was d/c home as 02 on room air was 100%. Returned to hospital with severe SOB.    PT Comments    Pt seems to be doing better today both cognitively and also functionally. He still needs increased cues and instrcutions for safety and also sequencing with  All functional mobility. He was able to ambulate in hall with walker approx 168ft with min a and verbal cues. Noted has tendency to externally rotate his L foot with walking, this did cause him to lose balance at last session, with verbal ces from therapist he was able to correct this. But, he needed continued cues to be able to do so. Pt reports that he should be able to take care of himself once home and spouse will help where he is deficient. Pt was on room air and sats did remain in 90s throughout session. Wil continue to work with pt and see how he progresses as to whether he may go home with spouse vs seeking placement, even temporary for rehabilitation purposes.        Follow Up Recommendations  Supervision/Assistance - 24 hour     Equipment Recommendations  Rolling walker with 5" wheels    Recommendations for Other Services OT consult     Precautions / Restrictions Precautions Precautions: Fall Precaution Comments: cognition Restrictions Weight Bearing Restrictions: No    Mobility  Bed Mobility Overal bed mobility: Needs Assistance Bed Mobility: Supine to Sit     Supine to sit: Supervision     General bed mobility comments: needs set  up and line management  Transfers Overall transfer level: Needs assistance Equipment used: Rolling walker (2 wheeled) Transfers: Sit to/from [employer] Transfers Sit to Stand: Min guard;Supervision Stand pivot transfers: Min assist;Min guard       General transfer comment: needs cues for safety and also hand placement for walker use  Ambulation/Gait Ambulation/Gait assistance: Min assist Gait Distance (Feet): 150 Feet Assistive device: Rolling walker (2 wheeled) Gait Pattern/deviations: Narrow base of support;Scissoring;Shuffle Gait velocity: extremely decreased   General Gait Details: noted L foot has tendency to ER and hence whip infront of RLE with ambulation, in last session he lost his balance due to this. today therapist was able to cue to wacth out for when this happened and with verbal reminders pt was able to correct this   Stairs             Wheelchair Mobility    Modified Rankin (Stroke Patients Only)       Balance Overall balance assessment: Needs assistance Sitting-balance support: Feet supported Sitting balance-Leahy Scale: Fair Sitting balance - Comments: slightly better than fair was able to sit unsupported edge of bed without loss of balance   Standing balance support: During functional activity;Bilateral upper extremity supported Standing balance-Leahy Scale: Fair                              Cognition Arousal/Alertness: Awake/alert Behavior During Therapy: Anxious Overall Cognitive Status: No family/caregiver present to determine baseline cognitive functioning  General Comments: still needing increased cues for safety and at times sequencing iwth functional mobility      Exercises      General Comments        Pertinent Vitals/Pain Pain Assessment: No/denies pain    Home Living                      Prior Function            PT Goals (current goals can  now be found in the care plan section) Acute Rehab PT Goals Patient Stated Goal: to get better and go home, states spouse will help him where needed Time For Goal Achievement: 04/19/19 Potential to Achieve Goals: Fair Progress towards PT goals: Progressing toward goals    Frequency    Min 3X/week      PT Plan Other (comment)(will need to reassess at next session)    Co-evaluation              AM-PAC PT "6 Clicks" Mobility   Outcome Measure  Help needed turning from your back to your side while in a flat bed without using bedrails?: A Little Help needed moving from lying on your back to sitting on the side of a flat bed without using bedrails?: A Little Help needed moving to and from a bed to a chair (including a wheelchair)?: A Little Help needed standing up from a chair using your arms (e.g., wheelchair or bedside chair)?: A Little Help needed to walk in hospital room?: A Little Help needed climbing 3-5 steps with a railing? : A Lot 6 Click Score: 17    End of Session Equipment Utilized During Treatment: Gait belt Activity Tolerance: Patient limited by fatigue;Patient limited by lethargy Patient left: in chair;with call bell/phone within reach;with chair alarm set Nurse Communication: Mobility status PT Visit Diagnosis: Other abnormalities of gait and mobility (R26.89);Muscle weakness (generalized) (M62.81);Unsteadiness on feet (R26.81)     Time: [id number] PT Time Calculation (min) (ACUTE ONLY): 28 min  Charges:  $Gait Training: 8-22 mins $Therapeutic Activity: 8-22 mins                     Horald Chestnut, PT    Delford Field 04/06/2019, 1:13 PM

## 2019-04-06 NOTE — TOC Initial Note (Addendum)
Transition of Care [facility]) - Initial/Assessment Note    Patient Details  Name: Edward Mejia MRN: [id number] Date of Birth: September 01, 1945  Transition of Care [facility]) CM/SW Contact:    Ninfa Meeker, RN Phone Number: [phone] (working remotely) 04/06/2019, 10:30 AM  Clinical Narrative: Case manager spoke with patient's wife via telephone at his request. Discussed need for [facility] therapy. Discussed available agency accepting his coverage. Referral was called to [facility], Adela Lank. Patient will receive Remdesivir until 04/08/19, on IV steroids, Room Air. TOC Team will follow for DME needs prior to discharge.                  11:02am- Wife requested patient have RW/3in1. CM contacted Magda Paganini with [employer]. Will request AC to deliver DME to patient's room.   Expected Discharge Plan: [place] Barriers to Discharge: Continued Medical Work up   Patient Goals and CMS Choice Patient states their goals for this hospitalization and ongoing recovery are:: per wife to get better   Choice offered to / list presented to : Spouse  Expected Discharge Plan and Services Expected Discharge Plan: [place]   Discharge Planning Services: CM Consult Post Acute Care Choice: [employer] arrangements for the past 2 months: Single Family Home                 DME Arranged: N/A DME Agency: NA       HH Arranged: PT HH Agency: [place] [facility] Agency Contacted: 04/06/19 Time HH Agency Contacted: 2 Representative spoke with at [place]: Adela Lank  Prior Living Arrangements/Services Living arrangements for the past 2 months: Single Family Home Lives with:: Spouse Patient language and need for interpreter reviewed:: Yes Do you feel safe going back to the place where you live?: Yes      Need for Family Participation in Patient Care: Yes (Comment) Care giver support system in place?: Yes (comment) Current home services:  Home PT Criminal Activity/Legal Involvement Pertinent to Current Situation/Hospitalization: No - Comment as needed  Activities of Daily Living Home Assistive Devices/Equipment: None ADL Screening (condition at time of admission) Patient's cognitive ability adequate to safely complete daily activities?: Yes Is the patient deaf or have difficulty hearing?: No Does the patient have difficulty seeing, even when wearing glasses/contacts?: No Does the patient have difficulty concentrating, remembering, or making decisions?: Yes Patient able to express need for assistance with ADLs?: No Does the patient have difficulty dressing or bathing?: Yes Independently performs ADLs?: Yes (appropriate for developmental age) Does the patient have difficulty walking or climbing stairs?: No Weakness of Legs: None Weakness of Arms/Hands: None  Permission Sought/Granted Permission sought to share information with : Case Manager       Permission granted to share info w AGENCY: [facility]        Emotional Assessment   Attitude/Demeanor/Rapport: Gracious   Orientation: : Oriented to Self, Oriented to Place, Oriented to  Time, Oriented to Situation Alcohol / Substance Use: Not Applicable Psych Involvement: No (comment)  Admission diagnosis:  Pneumonia due to COVID-19 virus [U07.1, J12.82] Patient Active Problem List   Diagnosis Date Noted  . Pneumonia due to COVID-19 virus 04/03/2019  . PVD (peripheral vascular disease) ([place]) 04/03/2019  . Recurrent cellulitis of lower extremity 04/04/2017  . Bilateral lower extremity edema 04/04/2017  . Bilateral lower extremity pain 04/04/2017  . Benign essential HTN 05/18/2016  . Elevated blood pressure reading 04/19/2016  .  Hip pain 09/15/2015  . Pain in the chest 03/27/2015  . Arthritis 03/24/2015  . History of GI bleed 03/24/2015  . History of brain disorder: history of amaurosis fugax 03/24/2015  . H/O adenomatous polyp of colon 03/24/2015  .  Immunosuppressed status ([place]) 03/24/2015  . Psoriatic arthritis ([place]) 08/10/2011  . History of pneumonia 04/04/2008  . Herpes 03/26/2007  . Dyslipidemia 11/25/2005  . Hypercholesteremia 11/23/2005   PCP:  Birdie Sons, MD Pharmacy:   [facility] [phone], [place] [address] Phone: [phone] Fax: [phone]     Social Determinants of Health (SDOH) Interventions    Readmission Risk Interventions No flowsheet data found.

## 2019-04-06 NOTE — Progress Notes (Signed)
PROGRESS NOTE    Edward Mejia  [id number] DOB: 04/18/1945 DOA: 04/03/2019 PCP: Birdie Sons, MD   Brief Narrative:  Edward Mejia is Edward Mejia 74 y.o. male with medical history significant of hypertension, PVD, cellulitis, remote history of rheumatic fever, arthritis, and diagnosed with COVID-19 on 2/17.  He presents with complaints of worsening shortness of breath and fatigue.  Patient had received the bamlanivimab infusion on 2/19.  He reports having Edward Mejia persistent dry cough.  He has not been eating or drinking anything and wife estimates that he has had 16 ounces of Gatorade over the last few days. At home he had Edward Mejia fever up to 102.4 F that persisted throughout most of the day yesterday despite utilizing Tylenol.  He was taken to [facility] and given IV fluids, but was ultimately discharged home as he was maintaining O2 saturations on room air..  Chest x-ray noted is of Edward Mejia multifocal pneumonia. Since returning home his wife notes that he is been more weak, and was unable to stand without assistance.  This morning he spent approximately 2 hours on the commode due to his symptoms and had Edward Mejia loose bowel movement.  Wife also notes that due to his shortness of breath he has unable to talk.  Patient denied having any nausea, vomiting, or chest pain.   Assessment & Plan:   Principal Problem:   Pneumonia due to COVID-19 virus Active Problems:   Psoriatic arthritis ([place])   Benign essential HTN   PVD (peripheral vascular disease) ([place])  Acute Hypoxic Resp Failure 2/2 COVID 19  Community Acquired Pneumonia O2 requirement improved today, on RA S/p Bamlanivimab on 2/19 Continue steroids/remdesivir Follow blood cx (NGTD) and procalcitonin (elevated to 7.3 on presentation, downtrending) I/O, daily weights IS, OOB, prone as able Elevated d dimer, CT PE protocol negative for PE, LE dopplers without evidence of DVT UA not c/w UTI with negative LE, nitrite - + RBC's and WBC's ->  Blood cx NGTD, urine cx with insignificant growth MRSA PCR negative Continue cefepime -> narrow to ceftriaxone with improvement   COVID-19 Labs  Recent Labs    04/04/19 0304 04/05/19 0303 04/06/19 0412  DDIMER 2.55* 1.85* 1.09*  FERRITIN 376* 307 261  CRP 18.1* 10.8* 4.4*    Lab Results  Component Value Date   SARSCOV2NAA Detected ([place]) 03/27/2019    Elevated D Dimer CT PE negative for PE LE dopplers negative D dimer downtrending  Concern for GI bleed: nighttime RN reportedly noted blood mixed with stool on 1 occasion on 2/24-25.  This was again reported 2/25-26.  This has only been reported on 2 occasions by night RN, no documentation (received sign out from PM doc about this).  Appears to have been self limited as no blood has been noted by daytime RN's I've followed up with. Trend H/H (relatively stable) -> repeat this PM Continue PPI  Consider GI c/s   Diarrhea: likely related to Edward Mejia.  This is improving per nurse and pt.  Acute Metabolic Encephalopathy Suspect this is 2/2 COVID infection Improving today, Edward Mejia&Ox3 Hold potentially sedating meds (gabapentin, narcotics - home norco) Follow ammonia (wnl) and VBG (not done, but with improvement will ctm).  B12 (low normal, follow MMA - wnl), folate (wnl), TSH (wnl), urinalysis (as above), blood cultures (as above). Delirium precautions Further w/u as appropriate -> consider imaging if worsening or not improving  AKI  NAGMA: improving, likely 2/2 hypovolemia and poor intake.  NAGMA likely 2/2 AKI.  Continue to  monitor, improving.    Psoriatic Arthritis On enbrel weekly Also, prednisone 7.5 mg daily (steroids increased as noted above)  PVD  CAD: continue ranexa, imdur   HTN: hold arb/thiazide for now  PUD: continue misoprostol, H2 blocker - wife notes last time he had GI bleed/admission for ulcer was 2015 (caution with anticoagulation)  DVT prophylaxis: SCD Code Status: DNR Family Communication: none at bedside  - wife over phone Disposition Plan:  . Patient came from: home           . Anticipated d/c place: home . Barriers to d/c OR conditions which need to be met to effect Edward Mejia safe d/c: pending improvement in acute metabolic encephalopathy, completion of remdesivir, therapy evaluation   Consultants:   none  Procedures:  none  Antimicrobials:  Anti-infectives (From admission, onward)   Start     Dose/Rate Route Frequency Ordered Stop   04/04/19 2330  vancomycin (VANCOREADY) IVPB 1500 mg/300 mL     1,500 mg 150 mL/hr over 120 Minutes Intravenous  Once 04/04/19 2309 04/05/19 0130   04/04/19 1000  remdesivir 100 mg in sodium chloride 0.9 % 100 mL IVPB     100 mg 200 mL/hr over 30 Minutes Intravenous Daily 04/03/19 1136 04/08/19 0959   04/03/19 2200  vancomycin (VANCOREADY) IVPB 2000 mg/400 mL     2,000 mg 200 mL/hr over 120 Minutes Intravenous  Once 04/03/19 2059 04/04/19 0002   04/03/19 1900  ceFEPIme (MAXIPIME) 2 g in sodium chloride 0.9 % 100 mL IVPB     2 g 200 mL/hr over 30 Minutes Intravenous Every 12 hours 04/03/19 1814     04/03/19 1900  vancomycin (VANCOREADY) IVPB 2000 mg/400 mL  Status:  Discontinued     2,000 mg 200 mL/hr over 120 Minutes Intravenous  Once 04/03/19 1814 04/03/19 2102   04/03/19 1840  vancomycin variable dose per unstable renal function (pharmacist dosing)  Status:  Discontinued      Does not apply See admin instructions 04/03/19 1840 04/05/19 0742   04/03/19 1200  remdesivir 200 mg in sodium chloride 0.9% 250 mL IVPB     200 mg 580 mL/hr over 30 Minutes Intravenous Once 04/03/19 1136 04/03/19 1504      Subjective: Feeling better Edward Mejia&Ox3  Objective: Vitals:   04/05/19 1935 04/05/19 2343 04/06/19 0405 04/06/19 0710  BP: 128/76 131/75 (!) 114/58 (!) 152/86  Pulse: 61 64 (!) 55 (!) 55  Resp: '20 18 19 18  '$ Temp: 98.2 F (36.8 C) 97.7 F (36.5 C) 97.7 F (36.5 C) 97.8 F (36.6 C)  TempSrc: Oral Oral Oral Oral  SpO2: 96% 98% 96% 97%  Weight:        Height:       No intake or output data in the 24 hours ending 04/06/19 1127 Filed Weights   04/03/19 1653  Weight: 93.3 kg    Examination:  General: No acute distress. Cardiovascular: Heart sounds show Myking Sar regular rate, and rhythm. Lungs: Clear to auscultation bilaterally Abdomen: Soft, nontender, nondistended  Neurological: Alert and oriented 3. Moves all extremities 4 . Cranial nerves II through XII grossly intact. Skin: Warm and dry. No rashes or lesions. Extremities: No clubbing or cyanosis. No edema.    Data Reviewed: I have personally reviewed following labs and imaging studies  CBC: Recent Labs  Lab 04/02/19 1740 04/02/19 1740 04/03/19 1620 04/03/19 1637 04/04/19 0304 04/04/19 1335 04/05/19 0303 04/05/19 1423 04/06/19 0412  WBC 6.0  --  8.1  --  4.7  --  8.5  --  7.4  NEUTROABS 3.7  --   --   --  3.1  --  6.5  --  5.5  HGB 18.8*   < > 18.8*   < > 16.6 17.9* 15.7 17.8* 15.8  HCT 55.5*   < > 55.5*   < > 50.7 53.2* 47.6 52.6* 46.5  MCV 83.7  --  84.3  --  86.2  --  85.0  --  83.8  PLT 347  --  265  --  269  --  336  --  379   < > = values in this interval not displayed.   Basic Metabolic Panel: Recent Labs  Lab 04/02/19 1740 04/02/19 1740 04/03/19 1620 04/03/19 1637 04/04/19 0304 04/05/19 0303 04/06/19 0412  NA 129*   < > 132* 134* 135 136 136  K 4.3   < > 4.3 4.1 4.4 3.9 3.8  CL 96*  --  104  --  106 106 106  CO2 27  --  15*  --  15* 20* 19*  GLUCOSE 117*  --  147*  --  131* 133* 118*  BUN 15  --  31*  --  32* 41* 38*  CREATININE 1.12  --  1.91*  --  1.62* 1.39* 1.30*  CALCIUM 8.3*  --  7.6*  --  7.4* 7.6* 7.8*  MG  --   --   --   --  1.8 2.0 2.2  PHOS  --   --   --   --  3.9 3.6 2.7   < > = values in this interval not displayed.   GFR: Estimated Creatinine Clearance: 59.2 mL/min (Aarib Pulido) (by C-G formula based on SCr of 1.3 mg/dL (H)). Liver Function Tests: Recent Labs  Lab 04/02/19 1740 04/03/19 1620 04/04/19 0304 04/05/19 0303 04/06/19 0412   AST 25 121* 109* 101* 69*  ALT 23 38 34 41 42  ALKPHOS 44 43 32* 31* 32*  BILITOT 1.7* 1.5* 1.0 0.9 0.8  PROT 6.1* 5.6* 4.7* 4.9* 4.9*  ALBUMIN 3.3* 3.0* 2.4* 2.6* 2.7*   No results for input(s): LIPASE, AMYLASE in the last 168 hours. Recent Labs  Lab 04/03/19 1620  AMMONIA 24   Coagulation Profile: Recent Labs  Lab 04/03/19 1620  INR 1.4*   Cardiac Enzymes: No results for input(s): CKTOTAL, CKMB, CKMBINDEX, TROPONINI in the last 168 hours. BNP (last 3 results) No results for input(s): PROBNP in the last 8760 hours. HbA1C: Recent Labs    04/04/19 0304  HGBA1C 5.8*   CBG: Recent Labs  Lab 04/05/19 1131 04/05/19 1559 04/05/19 2023 04/05/19 2050 04/06/19 0709  GLUCAP 131* 119* 141* 130* 119*   Lipid Profile: No results for input(s): CHOL, HDL, LDLCALC, TRIG, CHOLHDL, LDLDIRECT in the last 72 hours. Thyroid Function Tests: Recent Labs    04/03/19 1620  TSH 1.448   Anemia Panel: Recent Labs    04/03/19 1620 04/04/19 0304 04/05/19 0303 04/06/19 0412  VITAMINB12 198  --   --   --   FOLATE 20.4  --   --   --   FERRITIN  --    < > 307 261   < > = values in this interval not displayed.   Sepsis Labs: Recent Labs  Lab 04/03/19 0925 04/03/19 1620 04/04/19 0304 04/05/19 0303  PROCALCITON  --  7.31 6.40 5.59  LATICACIDVEN 2.8* 1.4  --   --     Recent Results (from the past 240 hour(s))  Novel  Coronavirus, NAA ([facility])     Status: Abnormal   Collection Time: 03/27/19  2:27 PM   Specimen: Nasopharyngeal(NP) swabs in vial transport medium   NASOPHARYNGE  TESTING  Result Value Ref Range Status   SARS-CoV-2, NAA Detected ([facility]) Not Detected Final    Comment: Testing was performed using the cobas(R) SARS-CoV-2 test. This nucleic acid amplification test was developed and its performance characteristics determined by [employer]. Nucleic acid amplification tests include RT-PCR and TMA. This test has not been FDA cleared or approved. This test has  been authorized by FDA under an Emergency Use Authorization (EUA). This test is only authorized for the duration of time the declaration that circumstances exist justifying the authorization of the emergency use of in vitro diagnostic tests for detection of SARS-CoV-2 virus and/or diagnosis of COVID-19 infection under section 564(b)(1) of the Act, 21 U.S.C. 937TKW-4(O) (1), unless the authorization is terminated or revoked sooner. When diagnostic testing is negative, the possibility of Briceida Rasberry false negative result should be considered in the context of [facility] patient's recent exposures and the presence of clinical signs and symptoms consistent with COVID-19. An individual without sympto ms of COVID-19 and who is not shedding SARS-CoV-2 virus would expect to have Edit Ricciardelli negative (not detected) result in this assay.   Culture, blood (routine x 2)     Status: None (Preliminary result)   Collection Time: 04/03/19  9:10 AM   Specimen: BLOOD  Result Value Ref Range Status   Specimen Description BLOOD SITE NOT SPECIFIED  Final   Special Requests   Final    BOTTLES DRAWN AEROBIC AND ANAEROBIC Blood Culture adequate volume   Culture   Final    NO GROWTH 3 DAYS Performed at [facility], [address] [place], [place] [address]    Report Status PENDING  Incomplete  Culture, blood (routine x 2)     Status: None (Preliminary result)   Collection Time: 04/03/19  9:15 AM   Specimen: BLOOD RIGHT HAND  Result Value Ref Range Status   Specimen Description BLOOD RIGHT HAND  Final   Special Requests   Final    BOTTLES DRAWN AEROBIC AND ANAEROBIC Blood Culture adequate volume   Culture   Final    NO GROWTH 3 DAYS Performed at [facility], [place] [address] [place], [place] [address]    Report Status PENDING  Incomplete  Culture, Urine     Status: Abnormal   Collection Time: 04/03/19 10:03 PM   Specimen: Urine  Result Value Ref Range Status   Specimen Description   Final    Urine Performed at  [place] [address], [place] , [place] [address]    Special Requests   Final    NONE Performed at [facility], [place] [address], [place], [place] [address]    Culture (Donalee Gaumond)  Final    <10,000 COLONIES/mL INSIGNIFICANT GROWTH Performed at [place] [address], [place], [place] [address]    Report Status 04/04/2019 FINAL  Final  MRSA PCR Screening     Status: None   Collection Time: 04/04/19  8:09 AM   Specimen: Nasopharyngeal  Result Value Ref Range Status   MRSA by PCR NEGATIVE NEGATIVE Final    Comment:        The GeneXpert MRSA Assay (FDA approved for NASAL specimens only), is one component of Ziasia Lenoir comprehensive MRSA colonization surveillance program. It is not intended to diagnose MRSA infection nor to  guide or monitor treatment for MRSA infections. Performed at [facility], [place] [address] [place], [place] [address]          Radiology Studies: No results found.      Scheduled Meds: . albuterol  2 puff Inhalation Q6H  . vitamin C  500 mg Oral Daily  . aspirin EC  81 mg Oral Daily  . enoxaparin (LOVENOX) injection  40 mg Subcutaneous Q24H  . feeding supplement (ENSURE ENLIVE)  237 mL Oral TID BM  . insulin aspart  0-5 Units Subcutaneous QHS  . insulin aspart  0-9 Units Subcutaneous TID WC  . isosorbide mononitrate  120 mg Oral Daily  . methylPREDNISolone (SOLU-MEDROL) injection  50 mg Intravenous Q12H  . misoprostol  100 mcg Oral Q lunch  . pantoprazole  40 mg Oral BID AC  . ranolazine  500 mg Oral BID  . sodium chloride flush  3 mL Intravenous Q12H  . zinc sulfate  220 mg Oral Daily   Continuous Infusions: . ceFEPime (MAXIPIME) IV 2 g (04/06/19 1019)  . remdesivir 100 mg in NS 100 mL 100 mg (04/06/19 0930)     LOS: 3 days    Time spent: over 72 min    Fayrene Helper, MD [facility]   To contact the attending provider between 7A-7P or the covering provider during  after hours 7P-7A, please log into the web site [email or web address] and access using universal [place] password for that web site. If you do not have the password, please call the hospital operator.  04/06/2019, 11:27 AM

## 2019-04-06 NOTE — Progress Notes (Signed)
Per RN, frank blood per rectum noted. Start PPI bolus and infusion Trend H/H, type and screen.  Ensure 2 PIV.

## 2019-04-07 LAB — GLUCOSE, CAPILLARY
Glucose-Capillary: 114 mg/dL — ABNORMAL HIGH (ref 70–99)
Glucose-Capillary: 142 mg/dL — ABNORMAL HIGH (ref 70–99)

## 2019-04-07 LAB — CBC WITH DIFFERENTIAL/PLATELET
Abs Immature Granulocytes: 0.13 10*3/uL — ABNORMAL HIGH (ref 0.00–0.07)
Basophils Absolute: 0 10*3/uL (ref 0.0–0.1)
Basophils Relative: 0 %
Eosinophils Absolute: 0 10*3/uL (ref 0.0–0.5)
Eosinophils Relative: 0 %
HCT: 46.4 % (ref 39.0–52.0)
Hemoglobin: 15.3 g/dL (ref 13.0–17.0)
Immature Granulocytes: 2 %
Lymphocytes Relative: 12 %
Lymphs Abs: 0.9 10*3/uL (ref 0.7–4.0)
MCH: 28.2 pg (ref 26.0–34.0)
MCHC: 33 g/dL (ref 30.0–36.0)
MCV: 85.5 fL (ref 80.0–100.0)
Monocytes Absolute: 0.6 10*3/uL (ref 0.1–1.0)
Monocytes Relative: 8 %
Neutro Abs: 6.2 10*3/uL (ref 1.7–7.7)
Neutrophils Relative %: 78 %
Platelets: 359 10*3/uL (ref 150–400)
RBC: 5.43 MIL/uL (ref 4.22–5.81)
RDW: 15.1 % (ref 11.5–15.5)
WBC: 7.9 10*3/uL (ref 4.0–10.5)
nRBC: 0 % (ref 0.0–0.2)

## 2019-04-07 LAB — COMPREHENSIVE METABOLIC PANEL
ALT: 41 U/L (ref 0–44)
AST: 46 U/L — ABNORMAL HIGH (ref 15–41)
Albumin: 2.6 g/dL — ABNORMAL LOW (ref 3.5–5.0)
Alkaline Phosphatase: 31 U/L — ABNORMAL LOW (ref 38–126)
Anion gap: 10 (ref 5–15)
BUN: 32 mg/dL — ABNORMAL HIGH (ref 8–23)
CO2: 20 mmol/L — ABNORMAL LOW (ref 22–32)
Calcium: 7.6 mg/dL — ABNORMAL LOW (ref 8.9–10.3)
Chloride: 107 mmol/L (ref 98–111)
Creatinine, Ser: 1.18 mg/dL (ref 0.61–1.24)
GFR calc Af Amer: >60 mL/min (ref >60)
GFR calc non Af Amer: >60 mL/min (ref >60)
Glucose, Bld: 120 mg/dL — ABNORMAL HIGH (ref 70–99)
Potassium: 3.9 mmol/L (ref 3.5–5.1)
Sodium: 137 mmol/L (ref 135–145)
Total Bilirubin: 1.2 mg/dL (ref 0.3–1.2)
Total Protein: 4.7 g/dL — ABNORMAL LOW (ref 6.5–8.1)

## 2019-04-07 LAB — MAGNESIUM: Magnesium: 2.1 mg/dL (ref 1.7–2.4)

## 2019-04-07 LAB — C-REACTIVE PROTEIN: CRP: 1.9 mg/dL — ABNORMAL HIGH (ref <1.0)

## 2019-04-07 LAB — PHOSPHORUS: Phosphorus: 2.6 mg/dL (ref 2.5–4.6)

## 2019-04-07 LAB — HEMOGLOBIN AND HEMATOCRIT, BLOOD
HCT: 49.7 % (ref 39.0–52.0)
Hemoglobin: 16.9 g/dL (ref 13.0–17.0)

## 2019-04-07 LAB — FERRITIN: Ferritin: 264 ng/mL (ref 24–336)

## 2019-04-07 LAB — D-DIMER, QUANTITATIVE: D-Dimer, Quant: 0.89 ug/mL-FEU — ABNORMAL HIGH (ref 0.00–0.50)

## 2019-04-07 LAB — PROCALCITONIN: Procalcitonin: 0.87 ng/mL

## 2019-04-07 MED ORDER — HYDROCORTISONE ACETATE 25 MG RE SUPP
25.0000 mg | Freq: Two times a day (BID) | RECTAL | Status: DC
  Administered 2019-04-07: 25 mg via RECTAL
  Filled 2019-04-07 (×2): qty 1

## 2019-04-07 MED ORDER — AMOXICILLIN-POT CLAVULANATE 875-125 MG PO TABS: 1.0000 | ORAL_TABLET | Freq: Two times a day (BID) | ORAL | 0 refills | Status: AC

## 2019-04-07 MED ORDER — HYDROCORTISONE ACETATE 25 MG RE SUPP: 25.0000 mg | Freq: Two times a day (BID) | RECTAL | 0 refills | Status: DC

## 2019-04-07 MED ORDER — PANTOPRAZOLE SODIUM 40 MG PO TBEC: 40.0000 mg | DELAYED_RELEASE_TABLET | Freq: Two times a day (BID) | ORAL | 0 refills | Status: DC

## 2019-04-07 NOTE — Progress Notes (Signed)
Written and verbal discharge instructions given to patient.  Patient verbalized understanding of instructions.  Discharged patient to home via private vehicle.

## 2019-04-07 NOTE — Discharge Summary (Signed)
Physician Discharge Summary  Baylor Kritzer [id number] DOB: Feb 23, 1945 DOA: 04/03/2019  PCP: Birdie Sons, MD  Admit date: 04/03/2019 Discharge date: 04/07/2019  Time spent: 40 minutes  Recommendations for Outpatient Follow-up:  1. Follow outpatient CBC/CMP 2. Follow with GI outpatient for blood in stool - holding aspirin 3. Follow quarantine guidelines per CDC (March 10) 4. Follow CXR outpatient  Discharge Diagnoses:  Principal Problem:   Pneumonia due to COVID-19 virus Active Problems:   Psoriatic arthritis ([place])   Benign essential HTN   PVD (peripheral vascular disease) ([place])   Discharge Condition: stable  Diet recommendation: heart healthy  Filed Weights   04/03/19 1653  Weight: 93.3 kg    History of present illness:  Tramon Picking Bryantis Panzy Bubeck 74 y.o.malewith medical history significant ofhypertension, PVD, cellulitis, remote history of rheumatic fever, arthritis, and diagnosed with COVID-19 on 2/17. He presents with complaints of worsening shortness of breath and fatigue. Patient had received the bamlanivimab infusionon 2/19. He reports having Socorro Ebron persistent dry cough. Hehas not been eating or drinking anythingand wife estimates that he has had 16 ounces of Gatorade over the last few days. At home he had Tarez Bowns fever up to102.59F that persisted throughout most of the day yesterday despite utilizing Tylenol. He was taken to [facility] and given IV fluids, but was ultimately discharged home as he was maintaining O2 saturations on room air.. Chest x-ray noted is of Loy Little multifocal pneumonia. Since returning home his wife notes that he is been more weak, and was unable to stand without assistance. This morning he spent approximately 2 hours on the commode due to his symptoms and had Alphonzo Devera loose bowel movement. Wife also notes that due to his shortness of breath he has unable to talk. Patient denied having any nausea, vomiting, or chest pain.  He was  admitted for AHRF 2/2 COVID pneumonia.  He was treated with steroids, remdesivir and had received bamlanivimab prior to admission.  He received antibiotics for possible superimposed bacterial pneumonia.  Hospitalization was complicated by BRBPR, but hemoglobin has been relatively stable and pt has history of hemorrhoids.  Suspect this is most likely cause, though also hx of PUD.  Outpatient GI follow up arranged.  Discharged with anusol, BID PPI, outpatient GI follow up and abx to complete course for pneumonia.  See below for additional details  Hospital Course:  Acute Hypoxic Resp Failure 2/2 COVID 19 Community Acquired Pneumonia O2 requirement improved today, on RA at discharge, doing well S/p Bamlanivimab on 2/19 Continue steroids/remdesivir Follow blood cx (NGTD) and procalcitonin (elevated to 7.3 on presentation, downtrending) I/O, daily weights IS, OOB, prone as able Elevated d dimer, CT PE protocol negative for PE, LE dopplers without evidence of DVT UA not c/w UTI with negative LE, nitrite - + RBC's and WBC's -> Blood cx NGTD, urine cx with insignificant growth MRSA PCR negative Continue cefepime -> narrow to ceftriaxone with improvement - discharge with augmentin to complete course.  COVID-19 Labs  Recent Labs    04/05/19 0303 04/06/19 0412 04/07/19 0317  DDIMER 1.85* 1.09* 0.89*  FERRITIN 307 261 264  CRP 10.8* 4.4* 1.9*    Lab Results  Component Value Date   SARSCOV2NAA Detected (Evalyse Stroope) 03/27/2019   Elevated D Dimer CT PE negative for PE LE dopplers negative D dimer downtrending  Concern for GI bleed: Pt with hx of PUD and also hemorrhoids.  Wife notes hemorrhoids for 20 years which occasionally bleed.  His hb is relatively stable and has  not significantly dropped.  Suspect BRBPR maybe 2/2 hemorrhoids.  Will start on anusol.  Discharge with BID PPI.  GI follow up arranged.  Return precautions.   Diarrhea: likely related to COVID.  This is improving per nurse and  pt.  Acute Metabolic Encephalopathy Suspect this is 2/2 COVID infection Improved, Dante Cooter&Ox3 Hold potentially sedating meds (gabapentin, narcotics - home norco) Follow ammonia (wnl) and VBG (not done, but with improvement will ctm). B12 (low normal, follow MMA - wnl), folate (wnl), TSH (wnl), urinalysis (as above), blood cultures (as above). Delirium precautions Further w/u as appropriate -> consider imaging if worsening or not improving  AKI  NAGMA: improving, likely 2/2 hypovolemia and poor intake.  NAGMA likely 2/2 AKI.  Continue to monitor, improving.    Psoriatic Arthritis On enbrel weekly Resume prednisone 7.5 mg daily at home  PVD  [id number] ranexa, imdur  HTN: hold arb/thiazide for now  PUD: continue misoprostol, PPI  Procedures: LE [place] Summary:  BILATERAL:  - No evidence of deep vein thrombosis seen in the lower extremities,  bilaterally.   Consultations:  GI to arrange follow up  Discharge Exam: Vitals:   04/07/19 0727 04/07/19 1049  BP: (!) 147/73   Pulse: (!) 57   Resp: 20   Temp: 98.4 F (36.9 C)   SpO2: 98% 100%   Feels good overall Eager to go home Discussed d/c plan with pt and wife  General: No acute distress. Cardiovascular: Heart sounds show Kynlea Blackston regular rate, and rhythm. Lungs: Clear to auscultation bilaterally  Abdomen: Soft, nontender, nondistended  Neurological: Alert and oriented 3. Moves all extremities 4 . Cranial nerves II through XII grossly intact. Skin: Warm and dry. No rashes or lesions. Extremities: No clubbing or cyanosis. No edema.  Discharge Instructions   Discharge Instructions    Call MD for:  difficulty breathing, headache or visual disturbances   Complete by: As directed    Call MD for:  extreme fatigue   Complete by: As directed    Call MD for:  persistant dizziness or light-headedness   Complete by: As directed    Call MD for:  persistant nausea and vomiting   Complete by: As directed    Call MD for:   redness, tenderness, or signs of infection (pain, swelling, redness, odor or green/yellow discharge around incision site)   Complete by: As directed    Call MD for:  severe uncontrolled pain   Complete by: As directed    Call MD for:  temperature >100.4   Complete by: As directed    Diet - low sodium heart healthy   Complete by: As directed    Discharge instructions   Complete by: As directed    You were admitted with COVID 19 pneumonia.  You've improved with steroids and remdesivir.  We also treated you with antibiotics.  We'll send you home with another antibiotic to take for 3 more days.  You had some issues with bleeding from your rectum while you were here.  This is most likely from hemorrhoids which you have Durand Wittmeyer history of.  We'll send you home with anusol for hemorrhoids.  You should also take protonix twice daily.  We'll send you for gastroenterology follow up since you have Chyann Ambrocio history of ulcers.  If your bleeding gets worse you should return to the hospital.  Return for new, recurrent, or worsening symptoms.  Please ask your PCP to request records from this hospitalization so they know what was done and what the next  steps will be.   Increase activity slowly   Complete by: As directed      Allergies as of 04/07/2019      Reactions   Pravachol [pravastatin] Other (See Comments)   Fatigue      Medication List    STOP taking these medications   aspirin 81 MG EC tablet     TAKE these medications   acetaminophen 500 MG tablet Commonly known as: TYLENOL Take 1,000 mg by mouth 3 (three) times daily as needed for moderate pain or headache.   amoxicillin-clavulanate 875-125 MG tablet Commonly known as: Augmentin Take 1 tablet by mouth 2 (two) times daily for 3 days.   Enbrel 50 MG/ML injection Generic drug: etanercept Inject 50 mg into the skin every Thursday.   gabapentin 300 MG capsule Commonly known as: NEURONTIN Take 600 mg by mouth daily.    HYDROcodone-acetaminophen 7.5-325 MG tablet Commonly known as: NORCO Take 1 tablet by mouth 3 (three) times daily as needed for moderate pain. What changed: when to take this   hydrocortisone 25 MG suppository Commonly known as: ANUSOL-HC Place 1 suppository (25 mg total) rectally 2 (two) times daily.   isosorbide mononitrate 120 MG 24 hr tablet Commonly known as: IMDUR Take 120 mg by mouth daily.   misoprostol 100 MCG tablet Commonly known as: CYTOTEC Take 100 mcg by mouth daily with lunch.   nitroGLYCERIN 0.4 MG SL tablet Commonly known as: NITROSTAT Place 0.4 mg under the tongue every 5 (five) minutes as needed for chest pain.   pantoprazole 40 MG tablet Commonly known as: Protonix Take 1 tablet (40 mg total) by mouth 2 (two) times daily before Jujuan Dugo meal.   predniSONE 5 MG tablet Commonly known as: DELTASONE Take 7.5 mg by mouth daily with lunch.   ranolazine 500 MG 12 hr tablet Commonly known as: RANEXA Take 500 mg by mouth in the morning and at bedtime.   valsartan-hydrochlorothiazide 160-12.5 MG tablet Commonly known as: DIOVAN-HCT Take 1 tablet by mouth daily.            Durable Medical Equipment  (From admission, onward)         Start     Ordered   04/07/19 1135  DME Walker  Once    Question Answer Comment  Walker: With [place] Wheels   Patient needs Kleigh Hoelzer walker to treat with the following condition Physical deconditioning      04/07/19 1144   04/06/19 1058  For home use only DME 3 n 1  Once     04/06/19 1058   04/06/19 1058  For home use only DME Walker rolling  Once    Question Answer Comment  Walker: With 5 Inch Wheels   Patient needs Neo Yepiz walker to treat with the following condition Generalized weakness      04/06/19 1058         Allergies  Allergen Reactions  . Pravachol [Pravastatin] Other (See Comments)    Fatigue   Follow-up Information    Care, [facility] Follow up.   Specialty: [place] Why: Arnie Maiolo representative from  [facility] will contact you to arrange start date and time for [facility] services (RN/PT/OT/aide) Contact information: [address] [place] [address] [phone]        [place] Follow up.   Why: Your Rolling walker and 3in1 wil be delivered to your room. (per [facility] DME has been placed by the front door at [facility]) Contact  information: [place] [address] [phone]        Birdie Sons, MD. Schedule an appointment as soon as possible for Ehab Humber visit in 2 week(s).   Specialty: Family Medicine Contact information: [address] [place] [address] [phone]            The results of significant diagnostics from this hospitalization (including imaging, microbiology, ancillary and laboratory) are listed below for reference.    Significant Diagnostic Studies: DG Chest 2 View  Result Date: 04/02/2019 CLINICAL DATA:  74 year old male with positive COVID-19 with worsening fatigue and weakness. EXAM: CHEST - 2 VIEW COMPARISON:  Chest radiograph dated 10/20/2017. FINDINGS: Bilateral diffuse hazy and streaky densities most consistent with multifocal pneumonia and in keeping with COVID-19. No lobar consolidation, pleural effusion, pneumothorax. Mild cardiomegaly. Atherosclerotic calcification of the aorta. No acute osseous pathology. Upper abdominal surgical clips. IMPRESSION: Multifocal pneumonia. Clinical correlation and follow-up to resolution recommended. Electronically Signed   By: Anner Crete M.D.   On: 04/02/2019 18:37   CT ANGIO CHEST PE W OR WO CONTRAST  Result Date: 04/03/2019 CLINICAL DATA:  Hypoxemia. COVID-19 infection. EXAM: CT ANGIOGRAPHY CHEST WITH CONTRAST TECHNIQUE: Multidetector CT imaging of the chest was performed using the standard protocol during bolus administration of intravenous contrast. Multiplanar CT image reconstructions and MIPs were obtained to evaluate the vascular anatomy. CONTRAST:   51mL OMNIPAQUE IOHEXOL 350 MG/ML SOLN COMPARISON:  CT 01/01/2018. Radiographs 04/03/2019 and 04/02/2019. FINDINGS: Cardiovascular: The pulmonary arteries are well opacified with contrast to the level of the subsegmental branches. Subsegmental branch valuation is mildly limited by breathing artifact. No evidence of acute pulmonary embolism. There is atherosclerosis of the aorta, great vessels and coronary arteries. The heart size is normal. There is no pericardial effusion. Mediastinum/Nodes: There are no enlarged mediastinal, hilar or axillary lymph nodes. The thyroid gland, trachea and esophagus demonstrate no significant findings. Lungs/Pleura: No pleural effusion or pneumothorax. Multifocal ground-glass and airspace opacities are present peripherally in both lungs consistent with viral pneumonia. In addition, there are mild dependent opacities in both lower lobes which are likely due to atelectasis. No endobronchial lesion identified. Upper abdomen: No acute findings are seen within the visualized upper abdomen. There are postsurgical changes within the stomach consistent with gastric bypass. Possible mild dilatation of the ventral pancreatic duct in the pancreatic head (image 155/4), similar to previous study. Musculoskeletal/Chest wall: There is no chest wall mass or suspicious osseous finding. Review of the MIP images confirms the above findings. IMPRESSION: 1. No evidence of acute pulmonary embolism. 2. Multifocal ground-glass and airspace opacities peripherally in both lungs consistent with viral pneumonia. 3. Coronary and aortic Atherosclerosis (ICD10-I70.0). Electronically Signed   By: Richardean Sale M.D.   On: 04/03/2019 17:36   DG Chest Port 1 View  Result Date: 04/03/2019 CLINICAL DATA:  Shortness of breath in Jailyn Leeson patient who is COVID-19 positive. EXAM: PORTABLE CHEST 1 VIEW COMPARISON:  PA and lateral chest 04/02/2019 and 05/01/2017. FINDINGS: Patchy bilateral airspace disease seen on the most  recent examination persists without notable change. Heart size is normal. No pneumothorax or pleural fluid. No acute or focal bony abnormality. IMPRESSION: No change of patchy bilateral airspace disease consistent with pneumonia. Electronically Signed   By: Inge Rise M.D.   On: 04/03/2019 10:14   VAS [place] LOWER EXTREMITY VENOUS (DVT)  Result Date: 04/04/2019  Lower Venous DVTStudy Indications: Elevated ddimer.  Comparison Study: no prior Performing Technologist: Abram Sander RVS  Examination Guidelines: Regino Fournet  complete evaluation includes B-mode imaging, spectral Doppler, color Doppler, and power Doppler as needed of all accessible portions of each vessel. Bilateral testing is considered an integral part of Milagros Middendorf complete examination. Limited examinations for reoccurring indications may be performed as noted. The reflux portion of the exam is performed with the patient in reverse Trendelenburg.  +---------+---------------+---------+-----------+----------+--------------+ RIGHT    CompressibilityPhasicitySpontaneityPropertiesThrombus Aging +---------+---------------+---------+-----------+----------+--------------+ CFV      Full           Yes      Yes                                 +---------+---------------+---------+-----------+----------+--------------+ SFJ      Full                                                        +---------+---------------+---------+-----------+----------+--------------+ FV Prox  Full                                                        +---------+---------------+---------+-----------+----------+--------------+ FV Mid   Full                                                        +---------+---------------+---------+-----------+----------+--------------+ FV DistalFull                                                        +---------+---------------+---------+-----------+----------+--------------+ PFV      Full                                                         +---------+---------------+---------+-----------+----------+--------------+ POP      Full           Yes      Yes                                 +---------+---------------+---------+-----------+----------+--------------+ PTV      Full                                                        +---------+---------------+---------+-----------+----------+--------------+ PERO     Full                                                        +---------+---------------+---------+-----------+----------+--------------+   +---------+---------------+---------+-----------+----------+--------------+  LEFT     CompressibilityPhasicitySpontaneityPropertiesThrombus Aging +---------+---------------+---------+-----------+----------+--------------+ CFV      Full           Yes      Yes                                 +---------+---------------+---------+-----------+----------+--------------+ SFJ      Full                                                        +---------+---------------+---------+-----------+----------+--------------+ FV Prox  Full                                                        +---------+---------------+---------+-----------+----------+--------------+ FV Mid   Full                                                        +---------+---------------+---------+-----------+----------+--------------+ FV DistalFull                                                        +---------+---------------+---------+-----------+----------+--------------+ PFV      Full                                                        +---------+---------------+---------+-----------+----------+--------------+ POP      Full           Yes      Yes                                 +---------+---------------+---------+-----------+----------+--------------+ PTV      Full                                                         +---------+---------------+---------+-----------+----------+--------------+ PERO     Full                                                        +---------+---------------+---------+-----------+----------+--------------+     Summary: BILATERAL: - No evidence of deep vein thrombosis seen in the lower extremities, bilaterally.   *See table(s) above for measurements and observations. Electronically signed by Monica Martinez MD on 04/04/2019 at 7:02:03 PM.    Final  Microbiology: Recent Results (from the past 240 hour(s))  Culture, blood (routine x 2)     Status: None (Preliminary result)   Collection Time: 04/03/19  9:10 AM   Specimen: BLOOD  Result Value Ref Range Status   Specimen Description BLOOD SITE NOT SPECIFIED  Final   Special Requests   Final    BOTTLES DRAWN AEROBIC AND ANAEROBIC Blood Culture adequate volume   Culture   Final    NO GROWTH 4 DAYS Performed at [facility], [address] [place], [place] [address]    Report Status PENDING  Incomplete  Culture, blood (routine x 2)     Status: None (Preliminary result)   Collection Time: 04/03/19  9:15 AM   Specimen: BLOOD RIGHT HAND  Result Value Ref Range Status   Specimen Description BLOOD RIGHT HAND  Final   Special Requests   Final    BOTTLES DRAWN AEROBIC AND ANAEROBIC Blood Culture adequate volume   Culture   Final    NO GROWTH 4 DAYS Performed at [facility], [place] [address], [place], [place] [address]    Report Status PENDING  Incomplete  Culture, Urine     Status: Abnormal   Collection Time: 04/03/19 10:03 PM   Specimen: Urine  Result Value Ref Range Status   Specimen Description   Final    Urine Performed at [facility] [address], [place], [place] [address]    Special Requests   Final    NONE Performed at [facility], [place] [address], [place], [place] [address]    Culture (Aunesty Tyson)  Final    <10,000 COLONIES/mL INSIGNIFICANT GROWTH Performed at  [facility] [address], [place], [place] [address]    Report Status 04/04/2019 FINAL  Final  MRSA PCR Screening     Status: None   Collection Time: 04/04/19  8:09 AM   Specimen: Nasopharyngeal  Result Value Ref Range Status   MRSA by PCR NEGATIVE NEGATIVE Final    Comment:        The GeneXpert MRSA Assay (FDA approved for NASAL specimens only), is one component of Johnte Portnoy comprehensive MRSA colonization surveillance program. It is not intended to diagnose MRSA infection nor to guide or monitor treatment for MRSA infections. Performed at [facility], [place] [address] [place],  [address]      Labs: Basic Metabolic Panel: Recent Labs  Lab 04/03/19 1620 04/03/19 1620 04/03/19 1637 04/04/19 0304 04/05/19 0303 04/06/19 0412 04/07/19 0317  NA 132*   < > 134* 135 136 136 137  K 4.3   < > 4.1 4.4 3.9 3.8 3.9  CL 104  --   --  106 106 106 107  CO2 15*  --   --  15* 20* 19* 20*  GLUCOSE 147*  --   --  131* 133* 118* 120*  BUN 31*  --   --  32* 41* 38* 32*  CREATININE 1.91*  --   --  1.62* 1.39* 1.30* 1.18  CALCIUM 7.6*  --   --  7.4* 7.6* 7.8* 7.6*  MG  --   --   --  1.8 2.0 2.2 2.1  PHOS  --   --   --  3.9 3.6 2.7 2.6   < > = values in this interval not displayed.   Liver Function Tests: Recent Labs  Lab 04/03/19 1620 04/04/19 0304 04/05/19 0303 04/06/19 0412 04/07/19 0317  AST 121*  109* 101* 69* 46*  ALT 38 34 41 42 41  ALKPHOS 43 32* 31* 32* 31*  BILITOT 1.5* 1.0 0.9 0.8 1.2  PROT 5.6* 4.7* 4.9* 4.9* 4.7*  ALBUMIN 3.0* 2.4* 2.6* 2.7* 2.6*   No results for input(s): LIPASE, AMYLASE in the last 168 hours. Recent Labs  Lab 04/03/19 1620  AMMONIA 24   CBC: Recent Labs  Lab 04/02/19 1740 04/02/19 1740 04/03/19 1620 04/03/19 1637 04/04/19 0304 04/04/19 1335 04/05/19 0303 04/05/19 0303 04/05/19 1423 04/06/19 0412 04/06/19 1944 04/07/19 0317 04/07/19 1100  WBC 6.0   < > 8.1  --  4.7  --  8.5  --   --  7.4  --  7.9  --    NEUTROABS 3.7  --   --   --  3.1  --  6.5  --   --  5.5  --  6.2  --   HGB 18.8*   < > 18.8*   < > 16.6   < > 15.7   < > 17.8* 15.8 16.0 15.3 16.9  HCT 55.5*   < > 55.5*   < > 50.7   < > 47.6   < > 52.6* 46.5 48.8 46.4 49.7  MCV 83.7   < > 84.3  --  86.2  --  85.0  --   --  83.8  --  85.5  --   PLT 347   < > 265  --  269  --  336  --   --  379  --  359  --    < > = values in this interval not displayed.   Cardiac Enzymes: No results for input(s): CKTOTAL, CKMB, CKMBINDEX, TROPONINI in the last 168 hours. BNP: BNP (last 3 results) No results for input(s): BNP in the last 8760 hours.  ProBNP (last 3 results) No results for input(s): PROBNP in the last 8760 hours.  CBG: Recent Labs  Lab 04/06/19 1138 04/06/19 1616 04/06/19 2034 04/07/19 0722 04/07/19 1138  GLUCAP 128* 116* 137* 114* 142*       Signed:  Fayrene Helper MD.  [facility] 04/07/2019, 4:33 PM

## 2019-04-07 NOTE — TOC Transition Note (Addendum)
Transition of Care [facility]) - CM/SW Discharge Note [phone] [place] weekend coverage Transitions of Care Marvetta Gibbons  RN Case Manager   Patient Details  Name: Edward Mejia MRN: [id number] Date of Birth: 1945-09-09  Transition of Care [facility]) CM/SW Contact:  Dawayne Patricia, RN Phone Number: 04/07/2019, 12:27 PM   Clinical Narrative:    Pt stable for transition home today, HH has been set up with [facility] per previous CM note- Cory with [facility] has been notified of d/c home today for start of care. DME was arranged with [employer] and should have been delivered to room by [facility] for transition home. (have sent msg to bedside RN to confirm). Per [facility] today DME has been placed by the front door at [facility] for pt to pick up as he is leaving.  Pt has PCP for f/u.   Final next level of care: [place] Barriers to Discharge: Barriers Resolved, No Barriers Identified   Patient Goals and CMS Choice Patient states their goals for this hospitalization and ongoing recovery are:: per wife to get better   Choice offered to / list presented to : Spouse  Discharge Placement               Home with [facility]        Discharge Plan and Services   Discharge Planning Services: CM Consult Post Acute Care Choice: Home Health          DME Arranged: 3-N-1, Walker rolling DME Agency: [place] Date DME Agency Contacted: 04/06/19 Time DME Agency Contacted: [id number] Representative spoke with at DME Agency: Learta Codding [facility] Arranged: PT /RN/OT/aide [place]: [place] Date [place]: 04/06/19 Time [place]: 1022 Representative spoke with at [place]: Many ([place]) Interventions     Readmission Risk Interventions Readmission Risk Prevention Plan 04/07/2019  Transportation Screening Complete  PCP or Specialist Appt within 5-7 Days Complete  Home Care Screening Complete  Medication Review (RN CM) Complete  Some  recent data might be hidden

## 2019-04-07 NOTE — Discharge Instructions (Signed)
Continue your quarantine until March 10th.  You can discontinue quarantine if you've continued to improve without the use of fever reducing medications.     Person Under Monitoring Name: Edward Mejia  Location: [place] [address]   Infection Prevention Recommendations for Individuals Confirmed to have, or Being Evaluated for, 2019 Novel Coronavirus (COVID-19) Infection Who Receive Care at Home  Individuals who are confirmed to have, or are being evaluated for, COVID-19 should follow the prevention steps below until [name] See healthcare provider or local or state health department says they can return to normal activities.  Stay home except to get medical care You should restrict activities outside your home, except for getting medical care. Do not go to work, school, or public areas, and do not use public transportation or taxis.  Call ahead before visiting your doctor Before your medical appointment, call the healthcare provider and tell them that you have, or are being evaluated for, COVID-19 infection. This will help the healthcare provider's office take steps to keep other people from getting infected. Ask your healthcare provider to call the local or state health department.  Monitor your symptoms Seek prompt medical attention if your illness is worsening (e.g., difficulty breathing). Before going to your medical appointment, call the healthcare provider and tell them that you have, or are being evaluated for, COVID-19 infection. Ask your healthcare provider to call the local or state health department.  Wear Pryor Guettler facemask You should wear Arvis Miguez facemask that covers your nose and mouth when you are in the same room with other people and when you visit Aryka Coonradt healthcare provider. People who live with or visit you should also wear Jodeen Mclin facemask while they are in the same room with you.  Separate yourself from other people in your home As much as possible, you should stay in Selma Rodelo  different room from other people in your home. Also, you should use Rocklin Soderquist separate bathroom, if available.  Avoid sharing household items You should not share dishes, drinking glasses, cups, eating utensils, towels, bedding, or other items with other people in your home. After using these items, you should wash them thoroughly with soap and water.  Cover your coughs and sneezes Cover your mouth and nose with Ghalia Reicks tissue when you cough or sneeze, or you can cough or sneeze into your sleeve. Throw used tissues in Story Vanvranken lined trash can, and immediately wash your hands with soap and water for at least 20 seconds or use an alcohol-based hand rub.  Wash your [facility] your hands often and thoroughly with soap and water for at least 20 seconds. You can use an alcohol-based hand sanitizer if soap and water are not available and if your hands are not visibly dirty. Avoid touching your eyes, nose, and mouth with unwashed hands.   Prevention Steps for Caregivers and Household Members of Individuals Confirmed to have, or Being Evaluated for, COVID-19 Infection Being Cared for in the Home  If you live with, or provide care at home for, [name] person confirmed to have, or being evaluated for, COVID-19 infection please follow these guidelines to prevent infection:  Follow healthcare provider's instructions Make sure that you understand and can help the patient follow any healthcare provider instructions for all care.  Provide for the patient's basic needs You should help the patient with basic needs in the home and provide support for getting groceries, prescriptions, and other personal needs.  Monitor the patient's symptoms If they are getting sicker, call  his or her medical provider and tell them that the patient has, or is being evaluated for, COVID-19 infection. This will help the healthcare provider's office take steps to keep other people from getting infected. Ask the healthcare provider to call the local  or state health department.  Limit the number of people who have contact with the patient  If possible, have only one caregiver for the patient.  Other household members should stay in another home or place of residence. If this is not possible, they should stay  in another room, or be separated from the patient as much as possible. Use [name] separate bathroom, if available.  Restrict visitors who do not have an essential need to be in the home.  Keep older adults, very young children, and other sick people away from the patient Keep older adults, very young children, and those who have compromised immune systems or chronic health conditions away from the patient. This includes people with chronic heart, lung, or kidney conditions, diabetes, and cancer.  Ensure good ventilation Make sure that shared spaces in the home have good air flow, such as from an air conditioner or an opened window, weather permitting.  Wash your hands often  Wash your hands often and thoroughly with soap and water for at least 20 seconds. You can use an alcohol based hand sanitizer if soap and water are not available and if your hands are not visibly dirty.  Avoid touching your eyes, nose, and mouth with unwashed hands.  Use disposable paper towels to dry your hands. If not available, use dedicated cloth towels and replace them when they become wet.  Wear [name] facemask and gloves  Wear [name] disposable facemask at all times in the room and gloves when you touch or have contact with the patient's blood, body fluids, and/or secretions or excretions, such as sweat, saliva, sputum, nasal mucus, vomit, urine, or feces.  Ensure the mask fits over your nose and mouth tightly, and do not touch it during use.  Throw out disposable facemasks and gloves after using them. Do not reuse.  Wash your hands immediately after removing your facemask and gloves.  If your personal clothing becomes contaminated, carefully remove clothing  and launder. Wash your hands after handling contaminated clothing.  Place all used disposable facemasks, gloves, and other waste in Lora Chavers lined container before disposing them with other household waste.  Remove gloves and wash your hands immediately after handling these items.  Do not share dishes, glasses, or other household items with the patient  Avoid sharing household items. You should not share dishes, drinking glasses, cups, eating utensils, towels, bedding, or other items with Herron Fero patient who is confirmed to have, or being evaluated for, COVID-19 infection.  After the person uses these items, you should wash them thoroughly with soap and water.  Wash laundry thoroughly  Immediately remove and wash clothes or bedding that have blood, body fluids, and/or secretions or excretions, such as sweat, saliva, sputum, nasal mucus, vomit, urine, or feces, on them.  Wear gloves when handling laundry from the patient.  Read and follow directions on labels of laundry or clothing items and detergent. In general, wash and dry with the warmest temperatures recommended on the label.  Clean all areas the individual has used often  Clean all touchable surfaces, such as counters, tabletops, doorknobs, bathroom fixtures, toilets, phones, keyboards, tablets, and bedside tables, every day. Also, clean any surfaces that may have blood, body fluids, and/or secretions or excretions on  them.  Wear gloves when cleaning surfaces the patient has come in contact with.  Use Jasmina Gendron diluted bleach solution (e.g., dilute bleach with 1 part bleach and 10 parts water) or Cristofer Yaffe household disinfectant with Karington Zarazua label that says EPA-registered for coronaviruses. To make Eryka Dolinger bleach solution at home, add 1 tablespoon of bleach to 1 quart (4 cups) of water. For Brittaney Beaulieu larger supply, add  cup of bleach to 1 gallon (16 cups) of water.  Read labels of cleaning products and follow recommendations provided on product labels. Labels contain instructions  for safe and effective use of the cleaning product including precautions you should take when applying the product, such as wearing gloves or eye protection and making sure you have good ventilation during use of the product.  Remove gloves and wash hands immediately after cleaning.  Monitor yourself for signs and symptoms of illness Caregivers and household members are considered close contacts, should monitor their health, and will be asked to limit movement outside of the home to the extent possible. Follow the monitoring steps for close contacts listed on the symptom monitoring form.   ? If you have additional questions, contact your local health department or call the epidemiologist on call at [phone] (available 24/7). ? This guidance is subject to change. For the most up-to-date guidance from [facility], please refer to their website: [email or web address]

## 2019-04-07 NOTE — Progress Notes (Signed)
Physical Therapy Treatment Patient Details Name: Edward Mejia MRN: [id number] DOB: 1945-03-28 Today's Date: 04/07/2019    History of Present Illness 74 y.o. male with medical history significant of hypertension, PVD, cellulitis, cancer, brain disorder-amaurosis fugax, remote history of rheumatic fever, arthritis, and diagnosed with COVID-19 on 2/17.  He presents with complaints of worsening shortness of breath and fatigue.  Patient had received the bamlanivimab infusion on 2/19. Pt was d/c home as 02 on room air was 100%. Returned to hospital with severe SOB.    PT Comments    He was resting in BS chair when PT arrived. Had checked on him at 840 AM, and he had not finished eating breakfast. On return, he agreed to perform ex- but had to have max encouragement to perform these. Initially said he had no "strength to perform the ex, as he needed to eat a lot more". He made excuses to not stand up as he "was told not to get up or attempt to walk". Explained that he was not supposed to get up by himself but that he could with nursing or PT/OT. He was able to perform LE ex as outlined. Practiced sit<>stand with RW, x 5, and stood 1-2 minutes each time with emphasis on postural awareness. He indicated that he planned to get a rollator. Should benefit from PT while hospitalized for optimal functional outcomes. He remains at risk for falls. Does require cues to re-direct, encourage, and stay on task.   Follow Up Recommendations  Supervision/Assistance - 24 hour     Equipment Recommendations  Rolling walker with 5" wheels    Recommendations for Other Services       Precautions / Restrictions Precautions Precautions: Fall Precaution Comments: cognition Restrictions Weight Bearing Restrictions: No    Mobility  Bed Mobility Overal bed mobility: Needs Assistance Bed Mobility: Supine to Sit     Supine to sit: Supervision     General bed mobility comments: needs set up and line  management  Transfers Overall transfer level: Needs assistance Equipment used: Rolling walker (2 wheeled) Transfers: Sit to/from [employer] Transfers Sit to Stand: Min guard;Supervision Stand pivot transfers: Min assist;Min guard       General transfer comment: needs cues for safety and also hand placement for walker use  Ambulation/Gait Ambulation/Gait assistance: Min assist(only practiced sit<>stand with RW, as he indicated that he was going home and could not be redirected.)   Assistive device: Rolling walker (2 wheeled) Gait Pattern/deviations: Narrow base of support;Scissoring;Shuffle Gait velocity: extremely decreased   General Gait Details: noted L foot has tendency to ER and hence whip infront of RLE with ambulation, in last session he lost his balance due to this. today therapist was able to cue to wacth out for when this happened and with verbal reminders pt was able to correct this   Stairs             Wheelchair Mobility    Modified Rankin (Stroke Patients Only)       Balance Overall balance assessment: Needs assistance Sitting-balance support: Feet supported Sitting balance-Leahy Scale: Fair Sitting balance - Comments: slightly better than fair was able to sit unsupported edge of bed without loss of balance   Standing balance support: During functional activity;Bilateral upper extremity supported Standing balance-Leahy Scale: Fair                              Cognition Arousal/Alertness: Awake/alert Behavior During Therapy:  Anxious Overall Cognitive Status: No family/caregiver present to determine baseline cognitive functioning                                 General Comments: still needing increased cues for safety and at times sequencing iwth functional mobility//said he could not possibly do any ex as he was not able to eat enough to gain his strength??      Exercises Total Joint Exercises Marching in  Standing: Seated General Exercises - Lower Extremity Ankle Circles/Pumps: AROM;Seated;10 reps Short Arc Quad: AROM;Seated;10 reps Hip Flexion/Marching: AROM;Seated;10 reps Other Exercises Other Exercises: Reminded to perform LE ex in between sessions. Says he normally does a lot of these ex as he has numbness and tingling in his feet. Made excuses to not stand up, but finally agreed.    General Comments General comments (skin integrity, edema, etc.): Patient was on RA throughout the PT session and maintained > 96% SPO2      Pertinent Vitals/Pain Pain Assessment: No/denies pain    Home Living                      Prior Function            PT Goals (current goals can now be found in the care plan section) Acute Rehab PT Goals Patient Stated Goal: to get better and go home, states spouse will help him where needed Time For Goal Achievement: 04/19/19 Potential to Achieve Goals: Fair Progress towards PT goals: Progressing toward goals    Frequency    Min 3X/week      PT Plan      Co-evaluation              AM-PAC PT "6 Clicks" Mobility   Outcome Measure  Help needed turning from your back to your side while in a flat bed without using bedrails?: A Little Help needed moving from lying on your back to sitting on the side of a flat bed without using bedrails?: A Little Help needed moving to and from a bed to a chair (including a wheelchair)?: A Little Help needed standing up from a chair using your arms (e.g., wheelchair or bedside chair)?: A Little Help needed to walk in hospital room?: A Little Help needed climbing 3-5 steps with a railing? : A Lot 6 Click Score: 17    End of Session   Activity Tolerance: Patient limited by fatigue;Patient limited by lethargy Patient left: in chair;with call bell/phone within reach;with chair alarm set Nurse Communication: Mobility status PT Visit Diagnosis: Other abnormalities of gait and mobility (R26.89);Muscle  weakness (generalized) (M62.81);Unsteadiness on feet (R26.81)     Time: [id number] PT Time Calculation (min) (ACUTE ONLY): 31 min  Charges:  $Therapeutic Exercise: 8-22 mins $Therapeutic Activity: 8-22 mins                   Keylie Beavers P, PT # [phone] CGV cell  Casandra Doffing 04/07/2019, 11:00 AM

## 2019-04-07 NOTE — Progress Notes (Signed)
Occupational Therapy Evaluation Patient Details Name: Edward Mejia MRN: [id number] DOB: 1945-12-05 Today's Date: 04/07/2019    History of Present Illness 74 y.o. male with medical history significant of hypertension, PVD, cellulitis, cancer, brain disorder-amaurosis fugax, remote history of rheumatic fever, arthritis, and diagnosed with COVID-19 on 2/17.  He presents with complaints of worsening shortness of breath and fatigue.  Patient had received the bamlanivimab infusion on 2/19. Pt was d/c home as 02 on room air was 100%. Returned to hospital with severe SOB.   Clinical Impression   PTA pt resided with wife, independent in all ADL, IADL, and mobility tasks. Pt does not ambulate with an assistive device and reports 0 falls in the last 6 months. Pt still drives. Pt currently independent to min guard for self-care and mobility tasks. Pt able to ambulate to/from bathroom with min guard, utilizing IV pole for support. Pt completed toileting task with variable supervision to min guard. Noted bright red blood from rectum with RN notified. Pt completed grooming tasks in sitting due to fatigue. SpO2 maintained in 90s throughout with no reports of shortness of breath. Educated pt on safety strategies, fall prevention, and energy conservation techniques with fair understanding. Pt demonstrates decreased strength, endurance, balance, standing tolerance, and activity tolerance impacting ability to complete self-care and functional transfer tasks. Recommend skilled OT services to address above deficits in order to promote function and prevent further decline. Recommend [facility] OT for continued rehab following hospital discharge.    Follow Up Recommendations  Home health OT;Supervision/Assistance - 24 hour    Equipment Recommendations  3 in 1 bedside commode(for use in shower)    Recommendations for Other Services       Precautions / Restrictions Precautions Precautions: Fall Precaution Comments:  cognition Restrictions Weight Bearing Restrictions: No      Mobility Bed Mobility Overal bed mobility: Needs Assistance Bed Mobility: Supine to Sit     Supine to sit: Supervision     General bed mobility comments: Pt seated in bedside chair upon OT arrival.   Transfers Overall transfer level: Needs assistance Equipment used: None Transfers: Sit to/from Stand Sit to Stand: Min guard Stand pivot transfers: Min assist;Min guard       General transfer comment: Use of IV pole for support while ambulating.     Balance Overall balance assessment: Needs assistance Sitting-balance support: Feet supported Sitting balance-Leahy Scale: Good Sitting balance - Comments: slightly better than fair was able to sit unsupported edge of bed without loss of balance   Standing balance support: During functional activity;Bilateral upper extremity supported Standing balance-Leahy Scale: Fair                             ADL either performed or assessed with clinical judgement   ADL Overall ADL's : Needs assistance/impaired Eating/Feeding: Independent;Sitting   Grooming: Set up;Supervision/safety;Sitting   Upper Body Bathing: Set up;Supervision/ safety;Sitting   Lower Body Bathing: Supervison/ safety;Min guard;Sit to/from stand   Upper Body Dressing : Set up;Supervision/safety;Sitting   Lower Body Dressing: Supervision/safety;Min guard;Sit to/from stand   Toilet Transfer: Min guard;Ambulation;Regular Toilet;Grab bars   Toileting- Clothing Manipulation and Hygiene: Supervision/safety;Min guard;Sit to/from stand       Functional mobility during ADLs: Min guard General ADL Comments: Pt able to ambulate to/from bathroom with min guard. Noted 0 instances of LOB, however pt unsteady on feet.      Vision Baseline Vision/History: Wears glasses Wears Glasses: Reading only  Perception     Praxis      Pertinent Vitals/Pain Pain Assessment: No/denies pain      Hand Dominance Right   Extremity/Trunk Assessment Upper Extremity Assessment Upper Extremity Assessment: Overall WFL for tasks assessed   Lower Extremity Assessment Lower Extremity Assessment: Defer to PT evaluation       Communication Communication Communication: HOH   Cognition Arousal/Alertness: Awake/alert Behavior During Therapy: Anxious Overall Cognitive Status: No family/caregiver present to determine baseline cognitive functioning                                 General Comments: Increased time to process instructions   General Comments  Pt on room air with SpO2 maintaining in 90s throughout.     Exercises Total Joint Exercises Marching in Standing: Seated General Exercises - Lower Extremity Ankle Circles/Pumps: AROM;Seated;10 reps Short Arc Quad: AROM;Seated;10 reps Hip Flexion/Marching: AROM;Seated;10 reps Other Exercises Other Exercises: Reminded to perform LE ex in between sessions. Says he normally does a lot of these ex as he has numbness and tingling in his feet. Made excuses to not stand up, but finally agreed.   Shoulder Instructions      Home Living Family/patient expects to be discharged to:: Private residence Living Arrangements: Spouse/significant other Available Help at Discharge: Family Type of Home: Mobile home Home Access: Stairs to enter Entrance Stairs-Number of Steps: 3   Home Layout: One level     Bathroom Shower/Tub: Tub/shower unit         Home Equipment: [place] - single point          Prior Functioning/Environment Level of Independence: Independent        Comments: Pt reports being independent in all ADL, IADL, and mobility tasks. Pt does not ambulate with an assistive device and reports 0 falls in the last 6 months. Pt still drives.        OT Problem List: Decreased strength;Decreased activity tolerance;Impaired balance (sitting and/or standing);Decreased cognition;Decreased safety  awareness;Cardiopulmonary status limiting activity      OT Treatment/Interventions: Self-care/ADL training;Therapeutic exercise;Neuromuscular education;Energy conservation;DME and/or AE instruction;Therapeutic activities;Patient/family education;Balance training    OT Goals(Current goals can be found in the care plan section) Acute Rehab OT Goals Patient Stated Goal: to go home Time For Goal Achievement: 04/21/19 Potential to Achieve Goals: Good ADL Goals Additional ADL Goal #1: Pt to complete all ADLs with modified independence and SpO2 maintaining in 90s. Additional ADL Goal #2: Pt to tolerate standing up to 10 min with modified independence in preparation for ADLs. Additional ADL Goal #3: Pt to recall and verbalize 3 fall prevention strategies with 0 verbal cues.  OT Frequency: Min 3X/week   Barriers to D/C:            Co-evaluation              AM-PAC OT "6 Clicks" Daily Activity     Outcome Measure Help from another person eating meals?: None Help from another person taking care of personal grooming?: A Little Help from another person toileting, which includes using toliet, bedpan, or urinal?: A Little Help from another person bathing (including washing, rinsing, drying)?: A Little Help from another person to put on and taking off regular upper body clothing?: A Little Help from another person to put on and taking off regular lower body clothing?: A Little 6 Click Score: 19   End of Session Nurse Communication: Mobility status  Activity Tolerance:  Patient limited by fatigue Patient left: in chair;with call bell/phone within reach;with chair alarm set  OT Visit Diagnosis: Unsteadiness on feet (R26.81);Muscle weakness (generalized) (M62.81)                Time: [id number] OT Time Calculation (min): 25 min Charges:  OT General Charges $OT Visit: 1 Visit OT Evaluation $OT Eval Low Complexity: 1 Low OT Treatments $Self Care/Home Management : 8-22 mins  Mauri Brooklyn OTR/L [phone]  Mauri Brooklyn 04/07/2019, 12:34 PM

## 2019-04-08 ENCOUNTER — Telehealth: Payer: Self-pay

## 2019-04-08 DIAGNOSIS — Z8669 Personal history of other diseases of the nervous system and sense organs: Secondary | ICD-10-CM | POA: Diagnosis not present

## 2019-04-08 DIAGNOSIS — L4050 Arthropathic psoriasis, unspecified: Secondary | ICD-10-CM | POA: Diagnosis not present

## 2019-04-08 DIAGNOSIS — U071 COVID-19: Secondary | ICD-10-CM | POA: Diagnosis not present

## 2019-04-08 LAB — CULTURE, BLOOD (ROUTINE X 2)
Culture: NO GROWTH
Culture: NO GROWTH
Special Requests: ADEQUATE
Special Requests: ADEQUATE

## 2019-04-08 NOTE — Telephone Encounter (Signed)
No HFU scheduled. Next apt is a CPE on 04/19/19.

## 2019-04-08 NOTE — Telephone Encounter (Signed)
Transition Care Management Follow-up Telephone Call  Date of discharge and from where: 04/07/19 from [facility]  How have you been since you were released from the hospital? Doing better and slept good last night. Still having pain in his hips and lower back but pt has been off his pain medication that was prescribed for arthritis. Pt is getting up and moving around but is still weak and tired. Declines fever, cough, SOB or n/v/d.  Any questions or concerns? No   Items Reviewed:  Did the pt receive and understand the discharge instructions provided? Yes   Medications obtained and verified? Wife declined reviewing over the phone.  Any new allergies since your discharge? No   Dietary orders reviewed? Yes  Do you have support at home? Yes   Other (ie: DME, Home Health, etc) Order placed to [facility] for [facility]. Wife declines needing HH at this time.   Functional Questionnaire: (I = Independent and D = Dependent)  Bathing/Dressing- I   Meal Prep- I  Eating- I  Maintaining continence- I  Transferring/Ambulation- D, currently using a walker for weakness.  Managing Meds- I   Follow up appointments reviewed:    PCP Hospital f/u appt confirmed? No, pt has a CPE scheduled for 04/19/19. Wife declined scheduling a HFU prior to that.   [facility] f/u appt confirmed? N/A   Are transportation arrangements needed? No   If their condition worsens, is the pt aware to call  their PCP or go to the ED? Yes  Was the patient provided with contact information for the PCP's office or ED? Yes  Was the pt encouraged to call back with questions or concerns? Yes

## 2019-04-09 ENCOUNTER — Telehealth: Payer: Self-pay | Admitting: Physician Assistant

## 2019-04-09 NOTE — Telephone Encounter (Signed)
Called pt and spoke with pt's wife. She will call back to schedule an appt.

## 2019-04-09 NOTE — Telephone Encounter (Signed)
-----   Message from Greggory Keen, LPN sent at [id number] 12:18 PM EST ----- Regarding: FW: Needs office appointment New patient needing an appointment after 04/17/19 with anyone. Dx - hematochezia ----- Message ----- From: Alfredia Ferguson, PA-C Sent: 04/07/2019  12:12 PM EST To: Greggory Keen, LPN Subject: Needs office appointment                       This patient is currently hospitalized at [facility] with COVID-19.  He is to be discharged today.Hospitalist called with concerns that he had been seeing a small amount of bright red blood intermittently with his bowel movements.  Hemoglobin normal No prior GI work-up  He will be a new patient.  Please get him an appointment with any provider after 04/17/2019 that he will be beyond his infectious state. Please call patient with the appointment thank you

## 2019-04-12 ENCOUNTER — Emergency Department (HOSPITAL_COMMUNITY): Payer: Medicare Other

## 2019-04-12 ENCOUNTER — Inpatient Hospital Stay (HOSPITAL_COMMUNITY)
Admission: EM | Admit: 2019-04-12 | Discharge: 2019-04-16 | DRG: 175 | Disposition: A | Discharge status: Home Health | Payer: Medicare Other | Attending: Internal Medicine | Admitting: Internal Medicine

## 2019-04-12 ENCOUNTER — Other Ambulatory Visit: Payer: Self-pay

## 2019-04-12 ENCOUNTER — Emergency Department (HOSPITAL_BASED_OUTPATIENT_CLINIC_OR_DEPARTMENT_OTHER): Payer: Medicare Other

## 2019-04-12 ENCOUNTER — Encounter (HOSPITAL_COMMUNITY): Payer: Self-pay | Admitting: *Deleted

## 2019-04-12 DIAGNOSIS — I82432 Acute embolism and thrombosis of left popliteal vein: Secondary | ICD-10-CM | POA: Diagnosis not present

## 2019-04-12 DIAGNOSIS — I82220 Acute embolism and thrombosis of inferior vena cava: Secondary | ICD-10-CM | POA: Diagnosis present

## 2019-04-12 DIAGNOSIS — I82409 Acute embolism and thrombosis of unspecified deep veins of unspecified lower extremity: Secondary | ICD-10-CM | POA: Diagnosis not present

## 2019-04-12 DIAGNOSIS — I824Y2 Acute embolism and thrombosis of unspecified deep veins of left proximal lower extremity: Secondary | ICD-10-CM

## 2019-04-12 DIAGNOSIS — E785 Hyperlipidemia, unspecified: Secondary | ICD-10-CM | POA: Diagnosis present

## 2019-04-12 DIAGNOSIS — D6869 Other thrombophilia: Secondary | ICD-10-CM | POA: Diagnosis present

## 2019-04-12 DIAGNOSIS — Z8616 Personal history of COVID-19: Secondary | ICD-10-CM | POA: Diagnosis present

## 2019-04-12 DIAGNOSIS — Z888 Allergy status to other drugs, medicaments and biological substances status: Secondary | ICD-10-CM

## 2019-04-12 DIAGNOSIS — I10 Essential (primary) hypertension: Secondary | ICD-10-CM | POA: Diagnosis present

## 2019-04-12 DIAGNOSIS — I82422 Acute embolism and thrombosis of left iliac vein: Secondary | ICD-10-CM | POA: Diagnosis present

## 2019-04-12 DIAGNOSIS — K219 Gastro-esophageal reflux disease without esophagitis: Secondary | ICD-10-CM | POA: Diagnosis present

## 2019-04-12 DIAGNOSIS — M7989 Other specified soft tissue disorders: Secondary | ICD-10-CM

## 2019-04-12 DIAGNOSIS — L4050 Arthropathic psoriasis, unspecified: Secondary | ICD-10-CM | POA: Diagnosis present

## 2019-04-12 DIAGNOSIS — I82402 Acute embolism and thrombosis of unspecified deep veins of left lower extremity: Secondary | ICD-10-CM | POA: Diagnosis not present

## 2019-04-12 DIAGNOSIS — Z8711 Personal history of peptic ulcer disease: Secondary | ICD-10-CM

## 2019-04-12 DIAGNOSIS — R531 Weakness: Secondary | ICD-10-CM | POA: Diagnosis not present

## 2019-04-12 DIAGNOSIS — I2699 Other pulmonary embolism without acute cor pulmonale: Secondary | ICD-10-CM | POA: Diagnosis not present

## 2019-04-12 DIAGNOSIS — I82452 Acute embolism and thrombosis of left peroneal vein: Secondary | ICD-10-CM | POA: Diagnosis present

## 2019-04-12 DIAGNOSIS — J189 Pneumonia, unspecified organism: Secondary | ICD-10-CM

## 2019-04-12 DIAGNOSIS — Z8701 Personal history of pneumonia (recurrent): Secondary | ICD-10-CM

## 2019-04-12 DIAGNOSIS — I82462 Acute embolism and thrombosis of left calf muscular vein: Secondary | ICD-10-CM | POA: Diagnosis not present

## 2019-04-12 DIAGNOSIS — A419 Sepsis, unspecified organism: Secondary | ICD-10-CM | POA: Diagnosis not present

## 2019-04-12 DIAGNOSIS — I82412 Acute embolism and thrombosis of left femoral vein: Secondary | ICD-10-CM | POA: Diagnosis present

## 2019-04-12 DIAGNOSIS — Z87891 Personal history of nicotine dependence: Secondary | ICD-10-CM

## 2019-04-12 DIAGNOSIS — I361 Nonrheumatic tricuspid (valve) insufficiency: Secondary | ICD-10-CM | POA: Diagnosis not present

## 2019-04-12 DIAGNOSIS — J9601 Acute respiratory failure with hypoxia: Secondary | ICD-10-CM | POA: Diagnosis present

## 2019-04-12 DIAGNOSIS — R0602 Shortness of breath: Secondary | ICD-10-CM | POA: Diagnosis not present

## 2019-04-12 DIAGNOSIS — I82442 Acute embolism and thrombosis of left tibial vein: Secondary | ICD-10-CM | POA: Diagnosis present

## 2019-04-12 DIAGNOSIS — I2510 Atherosclerotic heart disease of native coronary artery without angina pectoris: Secondary | ICD-10-CM | POA: Diagnosis present

## 2019-04-12 DIAGNOSIS — Z79899 Other long term (current) drug therapy: Secondary | ICD-10-CM

## 2019-04-12 DIAGNOSIS — Z66 Do not resuscitate: Secondary | ICD-10-CM | POA: Diagnosis present

## 2019-04-12 DIAGNOSIS — E222 Syndrome of inappropriate secretion of antidiuretic hormone: Secondary | ICD-10-CM | POA: Diagnosis not present

## 2019-04-12 DIAGNOSIS — U071 COVID-19: Secondary | ICD-10-CM | POA: Diagnosis present

## 2019-04-12 DIAGNOSIS — M79609 Pain in unspecified limb: Secondary | ICD-10-CM

## 2019-04-12 DIAGNOSIS — Z85828 Personal history of other malignant neoplasm of skin: Secondary | ICD-10-CM

## 2019-04-12 DIAGNOSIS — Z903 Acquired absence of stomach [part of]: Secondary | ICD-10-CM

## 2019-04-12 DIAGNOSIS — Z8249 Family history of ischemic heart disease and other diseases of the circulatory system: Secondary | ICD-10-CM

## 2019-04-12 DIAGNOSIS — Z7952 Long term (current) use of systemic steroids: Secondary | ICD-10-CM

## 2019-04-12 DIAGNOSIS — I2609 Other pulmonary embolism with acute cor pulmonale: Secondary | ICD-10-CM

## 2019-04-12 DIAGNOSIS — J1282 Pneumonia due to coronavirus disease 2019: Secondary | ICD-10-CM | POA: Diagnosis not present

## 2019-04-12 HISTORY — DX: Pneumonia, unspecified organism: J18.9

## 2019-04-12 HISTORY — DX: Sepsis, unspecified organism: A41.9

## 2019-04-12 HISTORY — DX: Acute embolism and thrombosis of unspecified deep veins of left lower extremity: I82.402

## 2019-04-12 LAB — CBC
HCT: 56.7 % — ABNORMAL HIGH (ref 39.0–52.0)
Hemoglobin: 18.7 g/dL — ABNORMAL HIGH (ref 13.0–17.0)
MCH: 28.3 pg (ref 26.0–34.0)
MCHC: 33 g/dL (ref 30.0–36.0)
MCV: 85.8 fL (ref 80.0–100.0)
Platelets: 228 10*3/uL (ref 150–400)
RBC: 6.61 MIL/uL — ABNORMAL HIGH (ref 4.22–5.81)
RDW: 17.5 % — ABNORMAL HIGH (ref 11.5–15.5)
WBC: 20.1 10*3/uL — ABNORMAL HIGH (ref 4.0–10.5)
nRBC: 0 % (ref 0.0–0.2)

## 2019-04-12 LAB — URINALYSIS, ROUTINE W REFLEX MICROSCOPIC
Bacteria, UA: NONE SEEN
Bilirubin Urine: NEGATIVE
Glucose, UA: NEGATIVE mg/dL
Hgb urine dipstick: NEGATIVE
Ketones, ur: 20 mg/dL — AB
Leukocytes,Ua: NEGATIVE
Nitrite: NEGATIVE
Protein, ur: 100 mg/dL — AB
Specific Gravity, Urine: >1.046 — ABNORMAL HIGH (ref 1.005–1.030)
pH: 6 (ref 5.0–8.0)

## 2019-04-12 LAB — HEPATIC FUNCTION PANEL
ALT: 23 U/L (ref 0–44)
AST: 29 U/L (ref 15–41)
Albumin: 2.6 g/dL — ABNORMAL LOW (ref 3.5–5.0)
Alkaline Phosphatase: 49 U/L (ref 38–126)
Bilirubin, Direct: 0.4 mg/dL — ABNORMAL HIGH (ref 0.0–0.2)
Indirect Bilirubin: 2.2 mg/dL — ABNORMAL HIGH (ref 0.3–0.9)
Total Bilirubin: 2.6 mg/dL — ABNORMAL HIGH (ref 0.3–1.2)
Total Protein: 5.9 g/dL — ABNORMAL LOW (ref 6.5–8.1)

## 2019-04-12 LAB — BASIC METABOLIC PANEL
Anion gap: 14 (ref 5–15)
BUN: 17 mg/dL (ref 8–23)
CO2: 16 mmol/L — ABNORMAL LOW (ref 22–32)
Calcium: 8 mg/dL — ABNORMAL LOW (ref 8.9–10.3)
Chloride: 101 mmol/L (ref 98–111)
Creatinine, Ser: 0.91 mg/dL (ref 0.61–1.24)
GFR calc Af Amer: >60 mL/min (ref >60)
GFR calc non Af Amer: >60 mL/min (ref >60)
Glucose, Bld: 102 mg/dL — ABNORMAL HIGH (ref 70–99)
Potassium: 5.1 mmol/L (ref 3.5–5.1)
Sodium: 131 mmol/L — ABNORMAL LOW (ref 135–145)

## 2019-04-12 LAB — PROCALCITONIN: Procalcitonin: 0.13 ng/mL

## 2019-04-12 LAB — PROTIME-INR
INR: 1.3 — ABNORMAL HIGH (ref 0.8–1.2)
Prothrombin Time: 16.2 seconds — ABNORMAL HIGH (ref 11.4–15.2)

## 2019-04-12 LAB — LACTIC ACID, PLASMA
Lactic Acid, Venous: 1.8 mmol/L (ref 0.5–1.9)
Lactic Acid, Venous: 2.8 mmol/L (ref 0.5–1.9)

## 2019-04-12 LAB — HEPARIN LEVEL (UNFRACTIONATED): Heparin Unfractionated: <0.1 IU/mL — ABNORMAL LOW (ref 0.30–0.70)

## 2019-04-12 LAB — TROPONIN I (HIGH SENSITIVITY)
Troponin I (High Sensitivity): 494 ng/L (ref <18)
Troponin I (High Sensitivity): 356 ng/L (ref <18)
Troponin I (High Sensitivity): 403 ng/L (ref <18)

## 2019-04-12 LAB — HEPATITIS B SURFACE ANTIGEN: Hepatitis B Surface Ag: NONREACTIVE

## 2019-04-12 LAB — BRAIN NATRIURETIC PEPTIDE: B Natriuretic Peptide: 676.3 pg/mL — ABNORMAL HIGH (ref 0.0–100.0)

## 2019-04-12 LAB — LACTATE DEHYDROGENASE: LDH: 369 U/L — ABNORMAL HIGH (ref 98–192)

## 2019-04-12 LAB — C-REACTIVE PROTEIN: CRP: 13.7 mg/dL — ABNORMAL HIGH (ref <1.0)

## 2019-04-12 LAB — MAGNESIUM: Magnesium: 1.8 mg/dL (ref 1.7–2.4)

## 2019-04-12 LAB — FIBRINOGEN: Fibrinogen: 622 mg/dL — ABNORMAL HIGH (ref 210–475)

## 2019-04-12 LAB — CBG MONITORING, ED: Glucose-Capillary: 100 mg/dL — ABNORMAL HIGH (ref 70–99)

## 2019-04-12 LAB — D-DIMER, QUANTITATIVE: D-Dimer, Quant: 2.95 ug/mL-FEU — ABNORMAL HIGH (ref 0.00–0.50)

## 2019-04-12 LAB — FERRITIN: Ferritin: 496 ng/mL — ABNORMAL HIGH (ref 24–336)

## 2019-04-12 LAB — ABO/RH: ABO/RH(D): A POS

## 2019-04-12 MED ORDER — ACETAMINOPHEN 650 MG RE SUPP: 650.0000 mg | Freq: Four times a day (QID) | RECTAL | Status: DC | PRN

## 2019-04-12 MED ORDER — ASCORBIC ACID 500 MG PO TABS
500.0000 mg | ORAL_TABLET | Freq: Every day | ORAL | Status: DC
  Administered 2019-04-12 – 2019-04-16 (×5): 500 mg via ORAL
  Filled 2019-04-12 (×5): qty 1

## 2019-04-12 MED ORDER — HEPARIN BOLUS VIA INFUSION
2900.0000 [IU] | Freq: Once | INTRAVENOUS | Status: AC
  Administered 2019-04-12: 2900 [IU] via INTRAVENOUS
  Filled 2019-04-12: qty 2900

## 2019-04-12 MED ORDER — PIPERACILLIN-TAZOBACTAM 3.375 G IVPB
3.3750 g | Freq: Three times a day (TID) | INTRAVENOUS | Status: DC
  Administered 2019-04-13 (×2): 3.375 g via INTRAVENOUS
  Filled 2019-04-12: qty 50

## 2019-04-12 MED ORDER — SENNOSIDES-DOCUSATE SODIUM 8.6-50 MG PO TABS
1.0000 | ORAL_TABLET | Freq: Every evening | ORAL | Status: DC | PRN
  Administered 2019-04-15: 1 via ORAL
  Filled 2019-04-12: qty 1

## 2019-04-12 MED ORDER — HEPARIN BOLUS VIA INFUSION
5500.0000 [IU] | Freq: Once | INTRAVENOUS | Status: AC
  Administered 2019-04-12: 5500 [IU] via INTRAVENOUS
  Filled 2019-04-12: qty 5500

## 2019-04-12 MED ORDER — GABAPENTIN 300 MG PO CAPS
600.0000 mg | ORAL_CAPSULE | Freq: Every day | ORAL | Status: DC
  Administered 2019-04-12 – 2019-04-16 (×5): 600 mg via ORAL
  Filled 2019-04-12 (×5): qty 2

## 2019-04-12 MED ORDER — HYDROCOD POLST-CPM POLST ER 10-8 MG/5ML PO SUER: 5.0000 mL | Freq: Two times a day (BID) | ORAL | Status: DC | PRN

## 2019-04-12 MED ORDER — ALBUTEROL SULFATE HFA 108 (90 BASE) MCG/ACT IN AERS
2.0000 | INHALATION_SPRAY | Freq: Four times a day (QID) | RESPIRATORY_TRACT | Status: DC | PRN
  Administered 2019-04-12: 2 via RESPIRATORY_TRACT
  Filled 2019-04-12: qty 6.7

## 2019-04-12 MED ORDER — IOHEXOL 350 MG/ML SOLN
100.0000 mL | Freq: Once | INTRAVENOUS | Status: AC | PRN
  Administered 2019-04-12: 100 mL via INTRAVENOUS

## 2019-04-12 MED ORDER — VANCOMYCIN HCL 2000 MG/400ML IV SOLN
2000.0000 mg | Freq: Once | INTRAVENOUS | Status: AC
  Administered 2019-04-12: 2000 mg via INTRAVENOUS
  Filled 2019-04-12 (×2): qty 400

## 2019-04-12 MED ORDER — RANOLAZINE ER 500 MG PO TB12
500.0000 mg | ORAL_TABLET | Freq: Two times a day (BID) | ORAL | Status: DC
  Administered 2019-04-12 – 2019-04-16 (×8): 500 mg via ORAL
  Filled 2019-04-12 (×10): qty 1

## 2019-04-12 MED ORDER — SODIUM CHLORIDE 0.9 % IV BOLUS
500.0000 mL | Freq: Once | INTRAVENOUS | Status: AC
  Administered 2019-04-12: 500 mL via INTRAVENOUS

## 2019-04-12 MED ORDER — GUAIFENESIN-DM 100-10 MG/5ML PO SYRP: 10.0000 mL | ORAL_SOLUTION | ORAL | Status: DC | PRN

## 2019-04-12 MED ORDER — PREDNISONE 5 MG PO TABS: 7.5000 mg | ORAL_TABLET | Freq: Every day | ORAL | Status: DC

## 2019-04-12 MED ORDER — PIPERACILLIN-TAZOBACTAM 3.375 G IVPB 30 MIN
3.3750 g | Freq: Once | INTRAVENOUS | Status: AC
  Administered 2019-04-12: 3.375 g via INTRAVENOUS
  Filled 2019-04-12: qty 50

## 2019-04-12 MED ORDER — SODIUM CHLORIDE 0.9 % IV SOLN: INTRAVENOUS | Status: DC

## 2019-04-12 MED ORDER — PANTOPRAZOLE SODIUM 40 MG PO TBEC
40.0000 mg | DELAYED_RELEASE_TABLET | Freq: Two times a day (BID) | ORAL | Status: DC
  Administered 2019-04-13 – 2019-04-16 (×7): 40 mg via ORAL
  Filled 2019-04-12 (×7): qty 1

## 2019-04-12 MED ORDER — HEPARIN (PORCINE) 25000 UT/250ML-% IV SOLN
2100.0000 [IU]/h | INTRAVENOUS | Status: DC
  Administered 2019-04-12: 1500 [IU]/h via INTRAVENOUS
  Administered 2019-04-13: 1900 [IU]/h via INTRAVENOUS
  Filled 2019-04-12 (×2): qty 250

## 2019-04-12 MED ORDER — ZINC SULFATE 220 (50 ZN) MG PO CAPS
220.0000 mg | ORAL_CAPSULE | Freq: Every day | ORAL | Status: DC
  Administered 2019-04-12 – 2019-04-16 (×5): 220 mg via ORAL
  Filled 2019-04-12 (×5): qty 1

## 2019-04-12 MED ORDER — OXYCODONE-ACETAMINOPHEN 5-325 MG PO TABS
2.0000 | ORAL_TABLET | ORAL | Status: DC | PRN
  Administered 2019-04-13 (×2): 2 via ORAL
  Filled 2019-04-12 (×2): qty 2

## 2019-04-12 MED ORDER — SODIUM CHLORIDE 0.9% FLUSH: 3.0000 mL | Freq: Once | INTRAVENOUS | Status: DC

## 2019-04-12 MED ORDER — FENTANYL CITRATE (PF) 100 MCG/2ML IJ SOLN
50.0000 ug | Freq: Once | INTRAMUSCULAR | Status: AC
  Administered 2019-04-12: 50 ug via INTRAVENOUS
  Filled 2019-04-12: qty 2

## 2019-04-12 MED ORDER — MISOPROSTOL 100 MCG PO TABS
100.0000 ug | ORAL_TABLET | Freq: Every day | ORAL | Status: DC
  Administered 2019-04-13 – 2019-04-16 (×4): 100 ug via ORAL
  Filled 2019-04-12 (×4): qty 1

## 2019-04-12 MED ORDER — ONDANSETRON HCL 4 MG PO TABS: 4.0000 mg | ORAL_TABLET | Freq: Four times a day (QID) | ORAL | Status: DC | PRN

## 2019-04-12 MED ORDER — VANCOMYCIN HCL 1250 MG/250ML IV SOLN
1250.0000 mg | Freq: Two times a day (BID) | INTRAVENOUS | Status: DC
  Administered 2019-04-13: 1250 mg via INTRAVENOUS
  Filled 2019-04-12: qty 250

## 2019-04-12 MED ORDER — ONDANSETRON HCL 4 MG/2ML IJ SOLN
4.0000 mg | Freq: Four times a day (QID) | INTRAMUSCULAR | Status: DC | PRN
  Administered 2019-04-12: 4 mg via INTRAVENOUS
  Filled 2019-04-12: qty 2

## 2019-04-12 MED ORDER — HYDROCODONE-ACETAMINOPHEN 7.5-325 MG PO TABS
1.0000 | ORAL_TABLET | Freq: Four times a day (QID) | ORAL | Status: DC
  Administered 2019-04-12 – 2019-04-16 (×15): 1 via ORAL
  Filled 2019-04-12 (×15): qty 1

## 2019-04-12 MED ORDER — NITROGLYCERIN 0.4 MG SL SUBL: 0.4000 mg | SUBLINGUAL_TABLET | SUBLINGUAL | Status: DC | PRN

## 2019-04-12 MED ORDER — ACETAMINOPHEN 325 MG PO TABS: 650.0000 mg | ORAL_TABLET | Freq: Four times a day (QID) | ORAL | Status: DC | PRN

## 2019-04-12 NOTE — Consult Note (Addendum)
NAME:  Edward Mejia, MRN:  [id number], DOB:  06/19/1945, LOS: 0 ADMISSION DATE:  04/12/2019, CONSULTATION DATE:  04/12/2019 REFERRING MD:  Dr. Rex Kras, CHIEF COMPLAINT:  Bilateral PE  Brief History   74 yo male admitted with large left lower extremity a DVT extending the entire left leg and into the IVC with bilateral PE as well with CT evidence of right heart strain. PCCM consulted for evaluation of TPA candidacy   History of present illness   Edward Mejia is a 74yo male with recent diagnosis os COVID pneumonia requiring inpatient treatment at this facility 04/03/2019 - 04/07/2019 who presented to the ED 04/12/2019 with complaints of left lower left swelling, mild dyspnea, and weakness. Workup this on admission significant for extensive left lower extremity DVT expanding the entire left left and into the IVC. Additionally seen with bilateral PE with CT evidence of heart strain.   Per wife patient originally did well post discharge Sunday into Monday but progressively became weaker with decreased mobility over the last several days.  States she noticed increased left lower extremity edema Sunday night that worsened into Monday.  Reports episode of coughing spell Monday night that resulted in the development of mild dyspnea.   PCCM consulted for assistance in determining the need of thrombolytic therapy given bilateral PEs with evidence of right heart strain.  Past Medical History  Peptic ulcer disease Partial gastrectomy secondary to peptic ulcer disease Hypertension Cellulitis Skin cancer Arthritis Recent Covid pneumonia  Significant Hospital Events   Admitted 3/5  Consults:  Vascular surgery  Procedures:    Significant Diagnostic Tests:  CTA chest 3/5 >Extensive bilateral pulmonary emboli with elevated RV LV ratio of 1.1.No significant change in the extensive peripheral bilateral pulmonary infiltrates.  Micro Data:    Antimicrobials:     Interim history/subjective:  Lying  in bed with complaints of feeling very poor with mild dyspnea.  Denies any chest pain, chest pressure, lightheadedness or dizziness.  Objective   Blood pressure 127/82, pulse 90, temperature (!) 97.4 F (36.3 C), temperature source Oral, resp. rate (!) 30, height 6' (1.829 m), weight 99.8 kg, SpO2 95 %.       No intake or output data in the 24 hours ending 04/12/19 1706 Filed Weights   04/12/19 1007  Weight: 99.8 kg    Examination: General: Chronically ill appearing elderly mildly deconditioned male lying in bed in no acute distress  HEENT: [place]/AT, MM pink/moist, PERRL,  Neuro: Alert and oriented, nonfocal CV: s1s2 regular rate and rhythm, no murmur, rubs, or gallops,  PULM: Clear to auscultation bilaterally, no added breath sounds, no increased work of breathing, oxygen saturations 92-96 on room air GI: soft, bowel sounds active in all 4 quadrants, non-tender, non-distended, Extremities: warm/dry, 3+ pitting edema extending from toes up to groin on left lower extremity  Skin: no rashes or lesions   Resolved Hospital Problem list     Assessment & Plan:  Bilateral pulmonary emboli with CT evidence of right heart strain Extensive left lower extremity DVT extending into the IVC -Patient with history of diagnosis of Covid pneumonia presented with extensive palpable extremity edema with work-up consistent with significant left lower extremity DVT and bilateral pulmonary emboli seen on CTA chest -PESI score 134 -Risk factors include advanced age, previous cancer diagnosis, recent decreased mobility, and hypercoagulable state given Covid pneumonia indicating DVT and PE likely provoked P: Continue systemic heparin drip Goal INR greater than 2 with 5-day minimal overlap Vascular surgery consulted for DVT  in left lower extremity May require thrombectomy prior to discharge Elevate left lower extremity  Obtain echocardiogram Patient does not meet criteria for thrombolytic therapy at this  time  Close monitoring on progressive care  Continuous oxygen saturation monitoring  Supplemental oxygen as needed for SPO2 goal greater than 92  If patient were to decompensate consideration should be given to possible administration of thrombolytics Verified with both patient and wife patient has a desire for DO NOT RESUSCITATE including NO INTUBATION   Rest of chronic medical conditions managed by primary  Best practice:  Diet: Heart healthy diet Pain/Anxiety/Delirium protocol (if indicated): PRNs VAP protocol (if indicated): N/A DVT prophylaxis: Heparin drip GI prophylaxis: PPI Glucose control: Monitor  Mobility: Bedrest Code Status: DNR, verified with both patient and wife at bedside 3/5 Family Communication: Updated extensively at bedside 3/5 Disposition: Recommend progressive care  Labs   CBC: Recent Labs  Lab 04/06/19 0412 04/06/19 1944 04/07/19 0317 04/07/19 1100 04/12/19 1045  WBC 7.4  --  7.9  --  20.1*  NEUTROABS 5.5  --  6.2  --   --   HGB 15.8 16.0 15.3 16.9 18.7*  HCT 46.5 48.8 46.4 49.7 56.7*  MCV 83.8  --  85.5  --  85.8  PLT 379  --  359  --  [id number]    Basic Metabolic Panel: Recent Labs  Lab 04/06/19 0412 04/07/19 0317 04/12/19 1045  NA 136 137 131*  K 3.8 3.9 5.1  CL 106 107 101  CO2 19* 20* 16*  GLUCOSE 118* 120* 102*  BUN 38* 32* 17  CREATININE 1.30* 1.18 0.91  CALCIUM 7.8* 7.6* 8.0*  MG 2.2 2.1  --   PHOS 2.7 2.6  --    GFR: Estimated Creatinine Clearance: 87.1 mL/min (by C-G formula based on SCr of 0.91 mg/dL). Recent Labs  Lab 04/06/19 0412 04/07/19 0317 04/12/19 1045  PROCALCITON  --  0.87  --   WBC 7.4 7.9 20.1*    Liver Function Tests: Recent Labs  Lab 04/06/19 0412 04/07/19 0317 04/12/19 1045  AST 69* 46* 29  ALT 42 41 23  ALKPHOS 32* 31* 49  BILITOT 0.8 1.2 2.6*  PROT 4.9* 4.7* 5.9*  ALBUMIN 2.7* 2.6* 2.6*   No results for input(s): LIPASE, AMYLASE in the last 168 hours. No results for input(s): AMMONIA in the  last 168 hours.  ABG    Component Value Date/Time   HCO3 15.1 (L) 04/03/2019 1637   TCO2 16 (L) 04/03/2019 1637   ACIDBASEDEF 9.0 (H) 04/03/2019 1637   O2SAT 92.0 04/03/2019 1637     Coagulation Profile: Recent Labs  Lab 04/12/19 1227  INR 1.3*    Cardiac Enzymes: No results for input(s): CKTOTAL, CKMB, CKMBINDEX, TROPONINI in the last 168 hours.  HbA1C: Hgb A1c MFr Bld  Date/Time Value Ref Range Status  04/04/2019 03:04 AM 5.8 (H) 4.8 - 5.6 % Final    Comment:    (NOTE) Pre diabetes:          5.7%-6.4% Diabetes:              >6.4% Glycemic control for   <7.0% adults with diabetes     CBG: Recent Labs  Lab 04/06/19 1616 04/06/19 2034 04/07/19 0722 04/07/19 1138 04/12/19 1145  GLUCAP 116* 137* 114* 142* 100*    Review of Systems: Positives in bold  Gen: Denies fever, chills, weight change, fatigue, night sweats HEENT: Denies blurred vision, double vision, hearing loss, tinnitus, sinus congestion, rhinorrhea, sore  throat, neck stiffness, dysphagia PULM: Denies shortness of breath, cough, sputum production, hemoptysis, wheezing CV: Denies chest pain, left lower extremity edema, orthopnea, paroxysmal nocturnal dyspnea, palpitations GI: Denies abdominal pain, nausea, vomiting, diarrhea, hematochezia, melena, constipation, change in bowel habits GU: Denies dysuria, hematuria, polyuria, oliguria, urethral discharge Endocrine: Denies hot or cold intolerance, polyuria, polyphagia or appetite change Derm: Denies rash, dry skin, scaling or peeling skin change Heme: Denies easy bruising, bleeding, bleeding gums Neuro: Denies headache, numbness, weakness, slurred speech, loss of memory or consciousness   Past Medical History  He,  has a past medical history of Arthritis, Cancer ([place]), Cellulitis, Cellulitis, H/O adenomatous polyp of colon (03/24/2015), History of brain disorder: history of amaurosis fugax (03/24/2015), History of rheumatic fever (03/24/2015), Hypertension,  and Peptic ulcer disease.   Surgical History    Past Surgical History:  Procedure Laterality Date  . APPENDECTOMY    . LEFT HEART CATH AND CORONARY ANGIOGRAPHY Left 02/06/2018   Procedure: LEFT HEART CATH AND CORONARY ANGIOGRAPHY;  Surgeon: Teodoro Spray, MD;  Location: [facility];  Service: Cardiovascular;  Laterality: Left;  Marland Kitchen MANDIBLE FRACTURE SURGERY     fractured jaw  . PARTIAL GASTRECTOMY     peptic ulcer disease     Social History   reports that he has quit smoking. He has never used smokeless tobacco. He reports that he does not drink alcohol or use drugs.   Family History   His family history includes Cancer in his brother, brother, and mother; Heart disease (age of onset: 61) in his father.   Allergies Allergies  Allergen Reactions  . Pravachol [Pravastatin] Other (See Comments)    Caused FATIGUE     Home Medications  Prior to Admission medications   Medication Sig Start Date End Date Taking? Authorizing Provider  acetaminophen (TYLENOL) 500 MG tablet Take 1,000 mg by mouth 3 (three) times daily as needed for mild pain or headache.    Yes [provider]  albuterol (PROVENTIL HFA) 108 (90 Base) MCG/ACT inhaler Inhale 2 puffs into the lungs every 6 (six) hours as needed for wheezing or shortness of breath.   Yes [provider]  etanercept (ENBREL) 50 MG/ML injection Inject 50 mg into the skin every Thursday.    Yes [provider]  gabapentin (NEURONTIN) 300 MG capsule Take 600 mg by mouth See admin instructions. Take 600 mg by mouth once daily at 4 PM 10/02/18  Yes [provider]  HYDROcodone-acetaminophen (NORCO) 7.5-325 MG tablet Take 1 tablet by mouth 3 (three) times daily as needed for moderate pain. Patient taking differently: Take 1 tablet by mouth every 6 (six) hours.  06/04/18  Yes Birdie Sons, MD  hydrocortisone (ANUSOL-HC) 25 MG suppository Place 1 suppository (25 mg total) rectally 2 (two) times daily.  04/07/19  Yes Elodia Florence., MD  isosorbide mononitrate (IMDUR) 120 MG 24 hr tablet Take 120 mg by mouth daily.  09/24/18  Yes [provider]  misoprostol (CYTOTEC) 100 MCG tablet Take 100 mcg by mouth daily with lunch.  10/31/11  Yes [provider]  nitroGLYCERIN (NITROSTAT) 0.4 MG SL tablet Place 0.4 mg under the tongue every 5 (five) minutes as needed for chest pain.   Yes [provider]  pantoprazole (PROTONIX) 40 MG tablet Take 1 tablet (40 mg total) by mouth 2 (two) times daily before a meal. 04/07/19 05/07/19 Yes Elodia Florence., MD  predniSONE (DELTASONE) 5 MG tablet Take 7.5 mg by mouth  daily with lunch.    Yes [provider]  ranolazine (RANEXA) 500 MG 12 hr tablet Take 500 mg by mouth in the morning and at bedtime. 03/18/19  Yes [provider]  tiZANidine (ZANAFLEX) 2 MG tablet Take 2 mg by mouth at bedtime.   Yes [provider]  valsartan-hydrochlorothiazide (DIOVAN-HCT) 160-12.5 MG tablet Take 1 tablet by mouth daily. 02/22/19  Yes Bacigalupo, Dionne Bucy, MD     Signature:   Johnsie Cancel, NP-C [facility] Contact / Pager information can be found on Amion  04/12/2019, 5:45 PM

## 2019-04-12 NOTE — Progress Notes (Addendum)
Hospital Consult    Reason for Consult:  LLE DVT Requesting Physician:  Theotis Burrow, MD MRN #:  [id number]  History of Present Illness: This is a 74 y.o. male who presents to the emergency department complaining of approximately 24-hour history of left lower extremity edema. He is interviewed and examined in the emergency department where his wife is at bedside. He has past medical history significant for recent COVID-19 infection, pneumonia fully treated. Prior to his hospitalization, he was able to ambulate short distances independently. He is disabled secondary to psoriatic arthritis. He describes easy fatigability but no acute shortness of breath. Denies chest pain. Denies fever, chills.  He has no prior history of DVT, no history of malignancy and no family history of clotting disorders.  Past surgical history includes partial gastrectomy secondary to peptic ulcer disease. No previous vascular surgery or interventions.  The pt not on a statin for cholesterol management.  The pt not on a daily aspirin.   Other AC:  none The pt is on Imdur, Diovan for hypertension.   The pt not diabetic.   Tobacco hx:  None  Past Medical History:  Diagnosis Date  . Arthritis   . Cancer ([place])    skin cancer  . Cellulitis   . Cellulitis   . H/O adenomatous polyp of colon 03/24/2015  . History of brain disorder: history of amaurosis fugax 03/24/2015  . History of rheumatic fever 03/24/2015   DID Have Rheumatic Fever.    . Hypertension   . Peptic ulcer disease     Past Surgical History:  Procedure Laterality Date  . APPENDECTOMY    . LEFT HEART CATH AND CORONARY ANGIOGRAPHY Left 02/06/2018   Procedure: LEFT HEART CATH AND CORONARY ANGIOGRAPHY;  Surgeon: Teodoro Spray, MD;  Location: [facility];  Service: Cardiovascular;  Laterality: Left;  Marland Kitchen MANDIBLE FRACTURE SURGERY     fractured jaw  . PARTIAL GASTRECTOMY     peptic ulcer disease    Allergies  Allergen Reactions  . Pravachol  [Pravastatin] Other (See Comments)    Fatigue    Prior to Admission medications   Medication Sig Start Date End Date Taking? Authorizing Provider  acetaminophen (TYLENOL) 500 MG tablet Take 1,000 mg by mouth 3 (three) times daily as needed for moderate pain or headache.    [provider]  etanercept (ENBREL) 50 MG/ML injection Inject 50 mg into the skin every Thursday.     [provider]  gabapentin (NEURONTIN) 300 MG capsule Take 600 mg by mouth daily.  10/02/18   [provider]  HYDROcodone-acetaminophen (NORCO) 7.5-325 MG tablet Take 1 tablet by mouth 3 (three) times daily as needed for moderate pain. Patient taking differently: Take 1 tablet by mouth every 6 (six) hours as needed for moderate pain.  06/04/18   Birdie Sons, MD  hydrocortisone (ANUSOL-HC) 25 MG suppository Place 1 suppository (25 mg total) rectally 2 (two) times daily. 04/07/19   Elodia Florence., MD  isosorbide mononitrate (IMDUR) 120 MG 24 hr tablet Take 120 mg by mouth daily.  09/24/18   [provider]  misoprostol (CYTOTEC) 100 MCG tablet Take 100 mcg by mouth daily with lunch.  10/31/11   [provider]  nitroGLYCERIN (NITROSTAT) 0.4 MG SL tablet Place 0.4 mg under the tongue every 5 (five) minutes as needed for chest pain.    [provider]  pantoprazole (PROTONIX) 40 MG tablet Take 1 tablet (40 mg total) by mouth 2 (two)  times daily before a meal. 04/07/19 05/07/19  Elodia Florence., MD  predniSONE (DELTASONE) 5 MG tablet Take 7.5 mg by mouth daily with lunch.     [provider]  ranolazine (RANEXA) 500 MG 12 hr tablet Take 500 mg by mouth in the morning and at bedtime. 03/18/19   [provider]  valsartan-hydrochlorothiazide (DIOVAN-HCT) 160-12.5 MG tablet Take 1 tablet by mouth daily. 02/22/19   Virginia Crews, MD  albuterol (PROVENTIL HFA;VENTOLIN HFA) 108 (90 Base) MCG/ACT inhaler Inhale 2 puffs into the lungs every 6 (six)  hours as needed for wheezing. 05/01/17 05/09/17  Birdie Sons, MD    Social History   Socioeconomic History  . Marital status: Married    Spouse name: Lovey Newcomer   . Number of children: 4  . Years of education: Not on file  . Highest education level: 8th grade  Occupational History  . Occupation: Disabled    Comment: due to Psoriatic Arthritis  Tobacco Use  . Smoking status: Former Research scientist (life sciences)  . Smokeless tobacco: Never Used  . Tobacco comment: quit over 40+ years ago  Substance and Sexual Activity  . Alcohol use: No    Alcohol/week: 0.0 standard drinks  . Drug use: No  . Sexual activity: Not on file  Other Topics Concern  . Not on file  Social History Narrative  . Not on file   Social Determinants of Health   Financial Resource Strain: Low Risk   . Difficulty of Paying Living Expenses: Not hard at all  Food Insecurity:   . Worried About Charity fundraiser in the Last Year: Not on file  . Ran Out of Food in the Last Year: Not on file  Transportation Needs:   . Lack of Transportation (Medical): Not on file  . Lack of Transportation (Non-Medical): Not on file  Physical Activity: Unknown  . Days of Exercise per Week: Not on file  . Minutes of Exercise per Session: 0 min  Stress: No Stress Concern Present  . Feeling of Stress : Not at all  Social Connections: Unknown  . Frequency of Communication with Friends and Family: Patient refused  . Frequency of Social Gatherings with Friends and Family: Patient refused  . Attends Religious Services: Patient refused  . Active Member of Clubs or Organizations: Patient refused  . Attends Archivist Meetings: Patient refused  . Marital Status: Patient refused  Intimate Partner Violence: Unknown  . Fear of Current or Ex-Partner: Patient refused  . Emotionally Abused: Patient refused  . Physically Abused: Patient refused  . Sexually Abused: Patient refused     Family History  Problem Relation Age of Onset  . Cancer Mother         lung cancer  . Heart disease Father 63       heart attack  . Cancer Brother        pancreatic cancer  . Cancer Brother        pancreatic cancer    ROS: [x]  Positive   [ ]  Negative   [ ]  All sytems reviewed and are negative  Cardiac: []  chest pain/pressure []  palpitations []  SOB lying flat x[]  DOE  Vascular: [x]  pain in legs while walking []  pain in legs at rest []  pain in legs at night []  non-healing ulcers []  hx of DVT x[]  swelling in legs  Pulmonary: []  productive cough []  asthma/wheezing []  home O2  Neurologic: [x]  weakness in []  arms [x]  legs []  numbness in []   arms []  legs []  hx of CVA []  mini stroke [] difficulty speaking or slurred speech []  temporary loss of vision in one eye []  dizziness  Hematologic: []  hx of cancer []  bleeding problems []  problems with blood clotting easily  Endocrine:   []  diabetes []  thyroid disease  GI []  vomiting blood []  blood in stool  GU: []  CKD/renal failure []  HD--[]  M/W/F or []  T/T/S []  burning with urination []  blood in urine  Psychiatric: []  anxiety []  depression  Musculoskeletal: []  arthritis []  joint pain  Integumentary: [x]  rashes []  ulcers  Constitutional: []  fever []  chills   Physical Examination  Vitals:   04/12/19 1330 04/12/19 1345  BP: 126/84 135/89  Pulse: 86 92  Resp: (!) 22 (!) 26  Temp:    SpO2: 97% 95%   Body mass index is 29.84 kg/m.  General:  WDWN in NAD Gait: Not observed HENT: WNL, normocephalic Pulmonary: normal non-labored breathing, without Rales, rhonchi,  wheezing Cardiac: regular, without  Murmurs, rubs or gallops; without carotid bruits Abdomen:  soft, NT/ND, no masses Skin: with rashes; erythematous patches of feet and ankles Vascular Exam/Pulses:  Right Left  Radial 3+ (hyperdynamic) 3+ (hyperdynamic)  Ulnar Not eval Not eval  Brachial 2+ (normal) 2+ (normal)  Femoral 2+ (normal) 2+ (normal)  Popliteal 2+ (normal) 1+ (weak)  DP  + Doppler + Doppler    PT + Doppler + Doppler  Peroneal + Doppler + Doppler   Extremities: without ischemic changes, without Gangrene , without cellulitis; without open wounds; he has biphasic dorsalis pedis pulses bilaterally. There is 1-2+ edema of the left lower extremity. Pitting edema of the ankle. Lower leg compartments soft Musculoskeletal: mild muscle wasting/atrophy lower legs/feet  Neurologic: A&O X 3;  No focal weakness or paresthesias are detected; speech is fluent/normal Psychiatric:  The pt has Normal affect.   CBC    Component Value Date/Time   WBC 20.1 (H) 04/12/2019 1045   RBC 6.61 (H) 04/12/2019 1045   HGB 18.7 (H) 04/12/2019 1045   HGB 18.6 (H) 02/22/2019 1057   HCT 56.7 (H) 04/12/2019 1045   HCT 53.9 (H) 02/22/2019 1057   PLT 228 04/12/2019 1045   PLT 435 02/22/2019 1057   MCV 85.8 04/12/2019 1045   MCV 90 02/22/2019 1057   MCH 28.3 04/12/2019 1045   MCHC 33.0 04/12/2019 1045   RDW 17.5 (H) 04/12/2019 1045   RDW 14.5 02/22/2019 1057   LYMPHSABS 0.9 04/07/2019 0317   MONOABS 0.6 04/07/2019 0317   EOSABS 0.0 04/07/2019 0317   BASOSABS 0.0 04/07/2019 0317    BMET    Component Value Date/Time   NA 131 (L) 04/12/2019 1045   NA 134 02/22/2019 1057   K 5.1 04/12/2019 1045   CL 101 04/12/2019 1045   CO2 16 (L) 04/12/2019 1045   GLUCOSE 102 (H) 04/12/2019 1045   BUN 17 04/12/2019 1045   BUN 9 02/22/2019 1057   CREATININE 0.91 04/12/2019 1045   CALCIUM 8.0 (L) 04/12/2019 1045   GFRNONAA >60 04/12/2019 1045   GFRAA >60 04/12/2019 1045    COAGS: Lab Results  Component Value Date   INR PENDING 04/12/2019   INR 1.4 (H) 04/03/2019   Non-Invasive Vascular Imaging:   LEFT:  - Findings consistent with acute deep vein thrombosis involving the left  common femoral vein, SF junction, left femoral vein, left proximal  profunda vein, left popliteal vein, left posterior tibial veins, left  peroneal veins, left gastrocnemius veins,  and EIV.   IVC/Iliac:  There is evidence of  acute thrombus involving the IVC. There is  evidence of acute thrombus involving the left external iliac vein.      ASSESSMENT/PLAN: This is a 74 y.o. male recent hospitalization for COVID-19 associated pneumonia. Presents with extensive DVT of the left lower extremity extending into the IVC and left external iliac vein. No evidence of phlegmasia. No prior history of DVT. CTA of chest, PE protocol pending.  Patient is evaluated with Dr. Carlis Abbott. Heparin infusion ordered. Discussed with the patient and his wife treatment for DVT, possible venous thrombectomy early next week, prevention of post thrombotic syndrome. We will need to monitor response to heparin, his lower extremity edema and the patient's overall status over the next 24 to 48 hours. Their questions were answered and they agreed to proceed with the current treatment plan.  -Recommend IV fluids, lower extremity elevation.   Risa Grill, PA-C [facility] [phone]  I have seen and evaluated the patient. I agree with the PA note as documented above.  74 year old male presents with extensive left lower extremity DVT after recent Covid diagnosis on 2/17.  Patient and his wife report that his leg swelling started yesterday.  No previous history of DVTs otherwise or other thromboembolic events.  He does have good Doppler signals in the left lower extremity.  No evidence of phlegmasia.  Would continue heparin drip and recommended leg elevation with gentle compression wraps.  Vascular will continue to follow.  He appears very weak right now and will have to see how he looks through the weekend to see if he could tolerate any type of venous intervention including percutaneous mechanical thrombectomy next week.  Wife states that he was walking normally prior to his Covid diagnosis in February but has become much more weak since then.  Marty Heck, MD [facility] Office:  [phone]

## 2019-04-12 NOTE — H&P (Signed)
History and Physical    Edward Mejia [id number] DOB: 01-07-1946 DOA: 04/12/2019  PCP: Birdie Sons, MD  Patient coming from: Home I have personally briefly reviewed patient's old medical records in [place]  Chief Complaint: Left lower extremity swelling, shortness of breath and chest pain  HPI: Edward Mejia is a 74 y.o. male with medical history significant of hypertension, hyperlipidemia, recently diagnosed with Covid pneumonia 2 weeks ago and was hospitalized from 04/03/2019 to 04/07/2019 at [facility] presented to emergency department due to left lower extremity swelling, shortness of breath and generalized weakness.  Patient's wife at bedside reports that patient was doing fine after the discharge from the hospital however on Monday she noticed worsening of left leg swelling pain however last night patient developed  shortness of breath, chest pain, dry cough and generalized generalized weakness however no history of fever, chills, nausea, vomiting, headache, blurry vision, orthopnea, PND, urinary or bowel changes.  No history of smoking, alcohol, illicit drug use  ED Course: Upon arrival to ED: Patient tachypneic, blood pressure: Soft, CBC showed leukocytosis of 20.1, CT angiogram shows bilateral pulmonary embolism with right heart strain, Doppler ultrasound of left lower extremity shows acute DVT.  Chest x-ray concerning for left lung pneumonia.  Patient started on IV heparin drip.  EDP consulted vascular surgery and PCCM who recommended to admit the patient at progressive unit as patient does not meet the criteria for thrombolytic therapy.  [facility] hospitalist consulted for the admission.    Review of Systems: As per HPI otherwise negative.    Past Medical History:  Diagnosis Date  . Arthritis   . Cancer ([place])    skin cancer  . Cellulitis   . Cellulitis   . H/O adenomatous polyp of colon 03/24/2015  . History of brain disorder: history of amaurosis fugax 03/24/2015   . History of rheumatic fever 03/24/2015   DID Have Rheumatic Fever.    . Hypertension   . Peptic ulcer disease     Past Surgical History:  Procedure Laterality Date  . APPENDECTOMY    . LEFT HEART CATH AND CORONARY ANGIOGRAPHY Left 02/06/2018   Procedure: LEFT HEART CATH AND CORONARY ANGIOGRAPHY;  Surgeon: Teodoro Spray, MD;  Location: [facility];  Service: Cardiovascular;  Laterality: Left;  Marland Kitchen MANDIBLE FRACTURE SURGERY     fractured jaw  . PARTIAL GASTRECTOMY     peptic ulcer disease     reports that he has quit smoking. He has never used smokeless tobacco. He reports that he does not drink alcohol or use drugs.  Allergies  Allergen Reactions  . Pravachol [Pravastatin] Other (See Comments)    Caused FATIGUE    Family History  Problem Relation Age of Onset  . Cancer Mother        lung cancer  . Heart disease Father 35       heart attack  . Cancer Brother        pancreatic cancer  . Cancer Brother        pancreatic cancer    Prior to Admission medications   Medication Sig Start Date End Date Taking? Authorizing Provider  acetaminophen (TYLENOL) 500 MG tablet Take 1,000 mg by mouth 3 (three) times daily as needed for mild pain or headache.    Yes [provider]  albuterol (PROVENTIL HFA) 108 (90 Base) MCG/ACT inhaler Inhale 2 puffs into the lungs every 6 (six) hours as needed for wheezing or shortness of breath.  Yes [provider]  etanercept (ENBREL) 50 MG/ML injection Inject 50 mg into the skin every Thursday.    Yes [provider]  gabapentin (NEURONTIN) 300 MG capsule Take 600 mg by mouth See admin instructions. Take 600 mg by mouth once daily at 4 PM 10/02/18  Yes [provider]  HYDROcodone-acetaminophen (NORCO) 7.5-325 MG tablet Take 1 tablet by mouth 3 (three) times daily as needed for moderate pain. Patient taking differently: Take 1 tablet by mouth every 6 (six) hours.  06/04/18  Yes Birdie Sons, MD   hydrocortisone (ANUSOL-HC) 25 MG suppository Place 1 suppository (25 mg total) rectally 2 (two) times daily. 04/07/19  Yes Elodia Florence., MD  isosorbide mononitrate (IMDUR) 120 MG 24 hr tablet Take 120 mg by mouth daily.  09/24/18  Yes [provider]  misoprostol (CYTOTEC) 100 MCG tablet Take 100 mcg by mouth daily with lunch.  10/31/11  Yes [provider]  nitroGLYCERIN (NITROSTAT) 0.4 MG SL tablet Place 0.4 mg under the tongue every 5 (five) minutes as needed for chest pain.   Yes [provider]  pantoprazole (PROTONIX) 40 MG tablet Take 1 tablet (40 mg total) by mouth 2 (two) times daily before a meal. 04/07/19 05/07/19 Yes Elodia Florence., MD  predniSONE (DELTASONE) 5 MG tablet Take 7.5 mg by mouth daily with lunch.    Yes [provider]  ranolazine (RANEXA) 500 MG 12 hr tablet Take 500 mg by mouth in the morning and at bedtime. 03/18/19  Yes [provider]  tiZANidine (ZANAFLEX) 2 MG tablet Take 2 mg by mouth at bedtime.   Yes [provider]  valsartan-hydrochlorothiazide (DIOVAN-HCT) 160-12.5 MG tablet Take 1 tablet by mouth daily. 02/22/19  Yes Virginia Crews, MD    Physical Exam: Vitals:   04/12/19 1345 04/12/19 1400 04/12/19 1415 04/12/19 1615  BP: 135/89 134/90 127/82   Pulse: 92 92 88 90  Resp: (!) 26 (!) 30 (!) 24 (!) 30  Temp:      TempSrc:      SpO2: 95% 95% 94% 95%  Weight:      Height:        Constitutional: NAD, calm, comfortable, on room air Eyes: PERRL, lids and conjunctivae normal ENMT: Mucous membranes are moist. Posterior pharynx clear of any exudate or lesions.Normal dentition.  Neck: normal, supple, no masses, no thyromegaly Respiratory: clear to auscultation bilaterally, no wheezing, no crackles. Normal respiratory effort. No accessory muscle use.  Cardiovascular: Regular rate and rhythm, no murmurs / rubs / gallops. No extremity edema. 2+ pedal pulses. No carotid bruits.  Abdomen: no  tenderness, no masses palpated. No hepatosplenomegaly. Bowel sounds positive.  Musculoskeletal: Left leg: Swollen, cold & calmly on touch as compared to right leg.   Neurologic: CN 2-12 grossly intact. Sensation intact, DTR normal. Strength 5/5 in all 4.  Psychiatric: Normal judgment and insight. Alert and oriented x 3. Normal mood.    Labs on Admission: I have personally reviewed following labs and imaging studies  CBC: Recent Labs  Lab 04/06/19 0412 04/06/19 1944 04/07/19 0317 04/07/19 1100 04/12/19 1045  WBC 7.4  --  7.9  --  20.1*  NEUTROABS 5.5  --  6.2  --   --   HGB 15.8 16.0 15.3 16.9 18.7*  HCT 46.5 48.8 46.4 49.7 56.7*  MCV 83.8  --  85.5  --  85.8  PLT 379  --  359  --  [id number]   Basic Metabolic  Panel: Recent Labs  Lab 04/06/19 0412 04/07/19 0317 04/12/19 1045  NA 136 137 131*  K 3.8 3.9 5.1  CL 106 107 101  CO2 19* 20* 16*  GLUCOSE 118* 120* 102*  BUN 38* 32* 17  CREATININE 1.30* 1.18 0.91  CALCIUM 7.8* 7.6* 8.0*  MG 2.2 2.1  --   PHOS 2.7 2.6  --    GFR: Estimated Creatinine Clearance: 87.1 mL/min (by C-G formula based on SCr of 0.91 mg/dL). Liver Function Tests: Recent Labs  Lab 04/06/19 0412 04/07/19 0317 04/12/19 1045  AST 69* 46* 29  ALT 42 41 23  ALKPHOS 32* 31* 49  BILITOT 0.8 1.2 2.6*  PROT 4.9* 4.7* 5.9*  ALBUMIN 2.7* 2.6* 2.6*   No results for input(s): LIPASE, AMYLASE in the last 168 hours. No results for input(s): AMMONIA in the last 168 hours. Coagulation Profile: Recent Labs  Lab 04/12/19 1227  INR 1.3*   Cardiac Enzymes: No results for input(s): CKTOTAL, CKMB, CKMBINDEX, TROPONINI in the last 168 hours. BNP (last 3 results) No results for input(s): PROBNP in the last 8760 hours. HbA1C: No results for input(s): HGBA1C in the last 72 hours. CBG: Recent Labs  Lab 04/06/19 1616 04/06/19 2034 04/07/19 0722 04/07/19 1138 04/12/19 1145  GLUCAP 116* 137* 114* 142* 100*   Lipid Profile: No results for input(s): CHOL, HDL,  LDLCALC, TRIG, CHOLHDL, LDLDIRECT in the last 72 hours. Thyroid Function Tests: No results for input(s): TSH, T4TOTAL, FREET4, T3FREE, THYROIDAB in the last 72 hours. Anemia Panel: No results for input(s): VITAMINB12, FOLATE, FERRITIN, TIBC, IRON, RETICCTPCT in the last 72 hours. Urine analysis:    Component Value Date/Time   COLORURINE AMBER (A) 04/03/2019 2202   APPEARANCEUR HAZY (A) 04/03/2019 2202   LABSPEC >1.046 (H) 04/03/2019 2202   PHURINE 5.0 04/03/2019 2202   GLUCOSEU NEGATIVE 04/03/2019 2202   HGBUR LARGE (A) 04/03/2019 2202   BILIRUBINUR NEGATIVE 04/03/2019 2202   KETONESUR 5 (A) 04/03/2019 2202   PROTEINUR >=300 (A) 04/03/2019 2202   NITRITE NEGATIVE 04/03/2019 2202   LEUKOCYTESUR NEGATIVE 04/03/2019 2202    Radiological Exams on Admission: CT Angio Chest PE W/Cm &/Or Wo Cm  Result Date: 04/12/2019 CLINICAL DATA:  Progressive shortness of breath. COVID-19. EXAM: CT ANGIOGRAPHY CHEST WITH CONTRAST TECHNIQUE: Multidetector CT imaging of the chest was performed using the standard protocol during bolus administration of intravenous contrast. Multiplanar CT image reconstructions and MIPs were obtained to evaluate the vascular anatomy. CONTRAST:  156mL OMNIPAQUE IOHEXOL 350 MG/ML SOLN COMPARISON:  Chest x-ray dated 04/12/2019 and 04/03/2019 and chest CT dated 04/03/2019 FINDINGS: Cardiovascular: The patient has developed numerous bilateral pulmonary emboli to all lobes of both lungs, most extensive in the right lung. Elevated RV LV ratio of 1.1. Aortic atherosclerosis. Coronary artery calcifications. No pericardial effusion. Mediastinum/Nodes: No enlarged mediastinal, hilar, or axillary lymph nodes. Thyroid gland, trachea, and esophagus demonstrate no significant findings. Lungs/Pleura: Extensive primarily peripheral pulmonary infiltrates are minimally changed since the prior study. No effusions. Upper Abdomen: No acute abnormality. Musculoskeletal: No chest wall abnormality. No acute  or significant osseous findings. Review of the MIP images confirms the above findings. IMPRESSION: Extensive bilateral pulmonary emboli with elevated RV LV ratio of 1.1. No significant change in the extensive peripheral bilateral pulmonary infiltrates. Aortic Atherosclerosis (ICD10-I70.0). Electronically Signed   By: Lorriane Shire M.D.   On: 04/12/2019 15:53   VAS [place] IVC/ILIAC (VENOUS ONLY)  Result Date: 04/12/2019 IVC/ILIAC STUDY Indications: DVT left leg Limitations: Air/bowel gas.  Performing Technologist:  Kristy Siebrecht ARDMS, RVT  Examination Guidelines: A complete evaluation includes B-mode imaging, spectral Doppler, color Doppler, and power Doppler as needed of all accessible portions of each vessel. Bilateral testing is considered an integral part of a complete examination. Limited examinations for reoccurring indications may be performed as noted.  IVC/Iliac Findings: +----------+------+--------+--------+    IVC    PatentThrombusComments +----------+------+--------+--------+ IVC Distalpatent acute           +----------+------+--------+--------+  +---------------+---------+-----------+---------+-----------+-----------------+       CIV      RT-PatentRT-ThrombusLT-PatentLT-Thrombus    Comments      +---------------+---------+-----------+---------+-----------+-----------------+ Common Iliac                                            Not visualized   Mid                                                    due to overlying                                                             bowel/gas     +---------------+---------+-----------+---------+-----------+-----------------+  +-------------------------+---------+-----------+---------+-----------+--------+            EIV           RT-PatentRT-ThrombusLT-PatentLT-ThrombusComments +-------------------------+---------+-----------+---------+-----------+--------+ External Iliac Vein Mid                                   acute            +-------------------------+---------+-----------+---------+-----------+--------+ External Iliac Vein                                      acute            Distal                                                                    +-------------------------+---------+-----------+---------+-----------+--------+   Summary: IVC/Iliac: There is evidence of acute thrombus involving the IVC. There is evidence of acute thrombus involving the left external iliac vein.  *See table(s) above for measurements and observations.  Electronically signed by Monica Martinez MD on 04/12/2019 at 4:39:37 PM.   Final    DG Chest Port 1 View  Result Date: 04/12/2019 CLINICAL DATA:  Weakness, shortness of breath EXAM: PORTABLE CHEST 1 VIEW COMPARISON:  04/03/2019 FINDINGS: Patchy airspace disease within the lungs bilaterally concerning for pneumonia. This has increased since prior study, particularly in the left lung. Heart is normal size. No effusions or acute bony abnormality. IMPRESSION: Progressive patchy bilateral airspace disease, particularly in the left lung concerning for pneumonia. Electronically Signed   By: Rolm Baptise M.D.  On: 04/12/2019 10:43   VAS [place] LOWER EXTREMITY VENOUS (DVT) (MC and WL 7a-7p)  Result Date: 04/12/2019  Lower Venous DVTStudy Indications: Pain, and Swelling.  Comparison Study: LEV 04-04-19, negative. Performing Technologist: Baldwin Crown ARDMS, RVT  Examination Guidelines: A complete evaluation includes B-mode imaging, spectral Doppler, color Doppler, and power Doppler as needed of all accessible portions of each vessel. Bilateral testing is considered an integral part of a complete examination. Limited examinations for reoccurring indications may be performed as noted. The reflux portion of the exam is performed with the patient in reverse Trendelenburg.  +-----+---------------+---------+-----------+----------+--------------+  RIGHTCompressibilityPhasicitySpontaneityPropertiesThrombus Aging +-----+---------------+---------+-----------+----------+--------------+ CFV  Full           Yes      Yes                                 +-----+---------------+---------+-----------+----------+--------------+   +---------+---------------+---------+-----------+----------+--------------+ LEFT     CompressibilityPhasicitySpontaneityPropertiesThrombus Aging +---------+---------------+---------+-----------+----------+--------------+ CFV      None           No       No                   Acute          +---------+---------------+---------+-----------+----------+--------------+ SFJ      None                                                        +---------+---------------+---------+-----------+----------+--------------+ FV Prox  None                                                        +---------+---------------+---------+-----------+----------+--------------+ FV Mid   None                                                        +---------+---------------+---------+-----------+----------+--------------+ FV DistalNone                                                        +---------+---------------+---------+-----------+----------+--------------+ PFV      None                                                        +---------+---------------+---------+-----------+----------+--------------+ POP      None           No       No                                  +---------+---------------+---------+-----------+----------+--------------+ PTV      None                                                        +---------+---------------+---------+-----------+----------+--------------+  PERO     None                                                        +---------+---------------+---------+-----------+----------+--------------+ Gastroc  None                                                         +---------+---------------+---------+-----------+----------+--------------+ EIV      None                                                        +---------+---------------+---------+-----------+----------+--------------+     Summary: RIGHT: - No evidence of common femoral vein obstruction.  LEFT: - Findings consistent with acute deep vein thrombosis involving the left common femoral vein, SF junction, left femoral vein, left proximal profunda vein, left popliteal vein, left posterior tibial veins, left peroneal veins, left gastrocnemius veins, and EIV. - No cystic structure found in the popliteal fossa.  *See table(s) above for measurements and observations. Electronically signed by Monica Martinez MD on 04/12/2019 at 4:39:23 PM.    Final     EKG: Sinus tachycardia, atrial premature complexes, no ST elevation or depression noted.  Assessment/Plan Principal Problem:   Bilateral pulmonary embolism (HCC) Active Problems:   Dyslipidemia   Benign essential HTN   COVID-19 virus infection   Left leg DVT (HCC)   HCAP (healthcare-associated pneumonia)   Sepsis ([place])   Bilateral pulmonary embolism with extensive left lower extremity DVT extending into the IVC: -Patient recently diagnosed with COVID-19 2 weeks ago.  Presented with shortness of breath and generalized weakness and dry cough. -Reviewed CT angiogram of the chest. -Troponin elevated at 494-likely secondary to PE.  BNP elevated at 676-likely secondary to right heart strain. -Started on IV heparin drip in ED-we will continue same. -EDP consulted vascular surgery and PCCM-appreciate help -Patient does not meet the criteria for thrombolytic therapy.  Will admit patient to stepdown unit for close monitoring.  Patient is maintaining oxygen saturation on room air.  On continuous pulse ox.  On telemetry.  Will get transthoracic echo -We will continue to monitor his vitals closely. -Elevate left lower extremity.  Sepsis due to  HCAP: -Patient admitted in the hospital 2 weeks ago due to Covid pneumonia.  Presented with tachypnea, blood pressure on lower side.  Leukocytosis of 20.1.  Chest x-ray shows progressive patchy bilateral airspace disease particularly in the left lung concerning for pneumonia -We will treat him as HCAP with broad-spectrum antibiotics vancomycin and Zosyn. -We will obtain blood culture, lactic acid, procalcitonin level, inflammatory markers.  -Start on gentle hydration.  Hypertension: Blood pressure is WNL/ soft -We will hold home blood pressure medication valsartan-HCTZ and monitor blood pressure closely  Psoriatic arthritis: Patient takes Enbrel and chronic steroids will continue same  PAD/CAD: Continue Ranexa.  DVT prophylaxis: On heparin drip Code Status: DNR-confirmed with patient's wife Family Communication: Patient's wife present at bedside.  Plan of care discussed with patient and his wife in length and the  verbalized understanding and agreed with it. Disposition Plan: To be determined Consults called: Vascular surgery and PCCM by EDP  admission status: Inpatient   Mckinley Jewel MD [facility] Pager [phone]  If 7PM-7AM, please contact night-coverage [email or web address] Password [facility]  04/12/2019, 5:57 PM

## 2019-04-12 NOTE — Progress Notes (Deleted)
ANTICOAGULATION CONSULT NOTE - Initial Consult  Pharmacy Consult for Heparin Indication: pulmonary embolus  Allergies  Allergen Reactions  . Pravachol [Pravastatin] Other (See Comments)    Caused FATIGUE    Patient Measurements: Height: 6' (182.9 cm) Weight: 220 lb (99.8 kg) IBW/kg (Calculated) : 77.6 Heparin Dosing Weight: 97 kg  Vital Signs: Temp: 97.7 F (36.5 C) (03/05 2115) Temp Source: Oral (03/05 2115) BP: 132/76 (03/05 2115) Pulse Rate: 96 (03/05 2115)  Labs: Recent Labs    04/12/19 1045 04/12/19 1227 04/12/19 2023  HGB 18.7*  --   --   HCT 56.7*  --   --   PLT 228  --   --   LABPROT  --  16.2*  --   INR  --  1.3*  --   HEPARINUNFRC  --   --  <0.10*  CREATININE 0.91  --   --   TROPONINIHS 494*  --  356*    Estimated Creatinine Clearance: 87.1 mL/min (by C-G formula based on SCr of 0.91 mg/dL).   Medical History: Past Medical History:  Diagnosis Date  . Arthritis   . Cancer ([place])    skin cancer  . Cellulitis   . Cellulitis   . H/O adenomatous polyp of colon 03/24/2015  . History of brain disorder: history of amaurosis fugax 03/24/2015  . History of rheumatic fever 03/24/2015   DID Have Rheumatic Fever.    . Hypertension   . Peptic ulcer disease    Assessment: 74 yr old male presented with SOB, LLE, CP, and recent COVID infection; diagnosed with bilateral PE with R heart strain and extensive LLE DVT.  No anticoagulation PTA.   Heparin level ~7.5 hrs after heparin 5500 units IV bolus, followed by heparin infusion at 1500 units/hr, was <0.10 units/ml, which is below the goal range for this pt. H/H 18.7/56.7, platelets 228. Per RN, no issues with IV or bleeding observed.  Goal of Therapy:  Heparin level 0.3-0.7 units/ml Monitor platelets by anticoagulation protocol: Yes   Plan:  Heparin 2900 units IV bolus X 1 Increase heparin infusion to 2150 units/hr Check heparin level in 8 hrs Monitor daily heparin level, CBC Monitor for signs/symptoms of  bleeding  Gillermina Hu, PharmD, BCPS, [facility] Clinical Pharmacist 04/12/2019 10:03 PM

## 2019-04-12 NOTE — Progress Notes (Signed)
Left lower extremity venous duplex exam completed.  Preliminary results given to Dr. Rex Kras.  Preliminary results can be found under CV proc under chart review.  04/12/2019 12:28 PM  Jonessa Triplett, K., RDMS, RVT

## 2019-04-12 NOTE — ED Provider Notes (Signed)
[facility] EMERGENCY DEPARTMENT Provider Note   CSN: [id number] Arrival date & time: 04/12/19  [id number]     History Chief Complaint  Patient presents with   Weakness    Edward Mejia is a 74 y.o. male.  74 year old male with past medical history below including hypertension, psoriatic arthritis, HLD, PUD who presents with weakness.  Patient was diagnosed with COVID-19 on 2/17.  He was admitted to the hospital 2/24-2/28.  Discharged home on Augmentin for possible secondary bacterial pneumonia and also referred to GI for some blood in his stool thought to be related to hemorrhoids.  Wife reports that the bleeding has stopped.  When he went home, he was ambulatory and eating well.  For the first day he had a normal appetite and seemed to be functioning normally at home.  However for the past few days he has had a progressive decline in his appetite and had worsening generalized weakness to the point that wife has had to help him stand and he has had to walk with a walker.  Yesterday she began noticing some left leg swelling compared to right and today the swelling is extensive, extending all the way up to his thigh.  He has had some shortness of breath and wife notes that he seemed to be breathing very fast at home.  He reported some chest pain last night.  Currently he states that his ribs are "sore" but he denies pain with inspiration.  T-max 99.9 at home.  He denies any abdominal pain or urinary symptoms.  No history of blood clots.  The history is provided by the patient and the spouse.  Weakness      Past Medical History:  Diagnosis Date   Arthritis    Cancer ([place])    skin cancer   Cellulitis    Cellulitis    H/O adenomatous polyp of colon 03/24/2015   History of brain disorder: history of amaurosis fugax 03/24/2015   History of rheumatic fever 03/24/2015   DID Have Rheumatic Fever.     Hypertension    Peptic ulcer disease     Patient Active Problem  List   Diagnosis Date Noted   Pneumonia due to COVID-19 virus 04/03/2019   PVD (peripheral vascular disease) ([place]) 04/03/2019   Recurrent cellulitis of lower extremity 04/04/2017   Bilateral lower extremity edema 04/04/2017   Bilateral lower extremity pain 04/04/2017   Benign essential HTN 05/18/2016   Elevated blood pressure reading 04/19/2016   Hip pain 09/15/2015   Pain in the chest 03/27/2015   Arthritis 03/24/2015   History of GI bleed 03/24/2015   History of brain disorder: history of amaurosis fugax 03/24/2015   H/O adenomatous polyp of colon 03/24/2015   Immunosuppressed status ([place]) 03/24/2015   Psoriatic arthritis ([place]) 08/10/2011   History of pneumonia 04/04/2008   Herpes 03/26/2007   Dyslipidemia 11/25/2005   Hypercholesteremia 11/23/2005    Past Surgical History:  Procedure Laterality Date   APPENDECTOMY     LEFT HEART CATH AND CORONARY ANGIOGRAPHY Left 02/06/2018   Procedure: LEFT HEART CATH AND CORONARY ANGIOGRAPHY;  Surgeon: Teodoro Spray, MD;  Location: [facility] [facility];  Service: Cardiovascular;  Laterality: Left;   MANDIBLE FRACTURE SURGERY     fractured jaw   PARTIAL GASTRECTOMY     peptic ulcer disease       Family History  Problem Relation Age of Onset   Cancer Mother        lung cancer  Heart disease Father 60       heart attack   Cancer Brother        pancreatic cancer   Cancer Brother        pancreatic cancer    Social History   Tobacco Use   Smoking status: Former Smoker   Smokeless tobacco: Never Used   Tobacco comment: quit over 40+ years ago  Substance Use Topics   Alcohol use: No    Alcohol/week: 0.0 standard drinks   Drug use: No    Home Medications Prior to Admission medications   Medication Sig Start Date End Date Taking? Authorizing Provider  acetaminophen (TYLENOL) 500 MG tablet Take 1,000 mg by mouth 3 (three) times daily as needed for mild pain or headache.    Yes [provider]  albuterol (PROVENTIL HFA) 108 (90 Base) MCG/ACT inhaler Inhale 2 puffs into the lungs every 6 (six) hours as needed for wheezing or shortness of breath.   Yes [provider]  etanercept (ENBREL) 50 MG/ML injection Inject 50 mg into the skin every Thursday.    Yes [provider]  gabapentin (NEURONTIN) 300 MG capsule Take 600 mg by mouth See admin instructions. Take 600 mg by mouth once daily at 4 PM 10/02/18  Yes [provider]  HYDROcodone-acetaminophen (NORCO) 7.5-325 MG tablet Take 1 tablet by mouth 3 (three) times daily as needed for moderate pain. Patient taking differently: Take 1 tablet by mouth every 6 (six) hours.  06/04/18  Yes Birdie Sons, MD  hydrocortisone (ANUSOL-HC) 25 MG suppository Place 1 suppository (25 mg total) rectally 2 (two) times daily. 04/07/19  Yes Elodia Florence., MD  isosorbide mononitrate (IMDUR) 120 MG 24 hr tablet Take 120 mg by mouth daily.  09/24/18  Yes [provider]  misoprostol (CYTOTEC) 100 MCG tablet Take 100 mcg by mouth daily with lunch.  10/31/11  Yes [provider]  nitroGLYCERIN (NITROSTAT) 0.4 MG SL tablet Place 0.4 mg under the tongue every 5 (five) minutes as needed for chest pain.   Yes [provider]  pantoprazole (PROTONIX) 40 MG tablet Take 1 tablet (40 mg total) by mouth 2 (two) times daily before a meal. 04/07/19 05/07/19 Yes Elodia Florence., MD  predniSONE (DELTASONE) 5 MG tablet Take 7.5 mg by mouth daily with lunch.    Yes [provider]  ranolazine (RANEXA) 500 MG 12 hr tablet Take 500 mg by mouth in the morning and at bedtime. 03/18/19  Yes [provider]  tiZANidine (ZANAFLEX) 2 MG tablet Take 2 mg by mouth at bedtime.   Yes [provider]  valsartan-hydrochlorothiazide (DIOVAN-HCT) 160-12.5 MG tablet Take 1 tablet by mouth daily. 02/22/19  Yes Bacigalupo, Dionne Bucy, MD    Allergies    Pravachol [pravastatin]  Review of  Systems   Review of Systems  Neurological: Positive for weakness.   All other systems reviewed and are negative except that which was mentioned in HPI  Physical Exam Updated Vital Signs BP 127/82    Pulse 88    Temp (!) 97.4 F (36.3 C) (Oral)    Resp (!) 24    Ht 6' (1.829 m)    Wt 99.8 kg    SpO2 94%    BMI 29.84 kg/m   Physical Exam Vitals and nursing note reviewed.  Constitutional:      General: He is not in acute distress.    Appearance: He is well-developed.     Comments:  Frail, chronically ill appearing  HENT:     Head: Normocephalic and atraumatic.  Eyes:     Conjunctiva/sclera: Conjunctivae normal.  Cardiovascular:     Rate and Rhythm: Normal rate and regular rhythm.     Heart sounds: Normal heart sounds. No murmur.  Pulmonary:     Effort: Pulmonary effort is normal. Tachypnea present.     Comments: Diminished b/l, no wheezing or crackles Abdominal:     General: Bowel sounds are normal. There is no distension.     Palpations: Abdomen is soft.     Tenderness: There is no abdominal tenderness.  Musculoskeletal:     Cervical back: Neck supple.     Left lower leg: Edema present.     Comments: 3+ pitting edema L leg from foot extending all the way through proximal thigh; bilateral feet are cold with prolonged cap refill  Skin:    General: Skin is warm and dry.     Comments: Mottling of skin on b/l feet  Neurological:     Mental Status: He is alert and oriented to person, place, and time.     Comments: Fluent speech  Psychiatric:        Judgment: Judgment normal.     ED Results / Procedures / Treatments   Labs (all labs ordered are listed, but only abnormal results are displayed) Labs Reviewed  BASIC METABOLIC PANEL - Abnormal; Notable for the following components:      Result Value   Sodium 131 (*)    CO2 16 (*)    Glucose, Bld 102 (*)    Calcium 8.0 (*)    All other components within normal limits  CBC - Abnormal; Notable for the following components:    WBC 20.1 (*)    RBC 6.61 (*)    Hemoglobin 18.7 (*)    HCT 56.7 (*)    RDW 17.5 (*)    All other components within normal limits  HEPATIC FUNCTION PANEL - Abnormal; Notable for the following components:   Total Protein 5.9 (*)    Albumin 2.6 (*)    Total Bilirubin 2.6 (*)    Bilirubin, Direct 0.4 (*)    Indirect Bilirubin 2.2 (*)    All other components within normal limits  BRAIN NATRIURETIC PEPTIDE - Abnormal; Notable for the following components:   B Natriuretic Peptide 676.3 (*)    All other components within normal limits  PROTIME-INR - Abnormal; Notable for the following components:   Prothrombin Time 16.2 (*)    INR 1.3 (*)    All other components within normal limits  CBG MONITORING, ED - Abnormal; Notable for the following components:   Glucose-Capillary 100 (*)    All other components within normal limits  TROPONIN I (HIGH SENSITIVITY) - Abnormal; Notable for the following components:   Troponin I (High Sensitivity) 494 (*)    All other components within normal limits  URINALYSIS, ROUTINE W REFLEX MICROSCOPIC  HEPARIN LEVEL (UNFRACTIONATED)    EKG None  Radiology CT Angio Chest PE W/Cm &/Or Wo Cm  Result Date: 04/12/2019 CLINICAL DATA:  Progressive shortness of breath. COVID-19. EXAM: CT ANGIOGRAPHY CHEST WITH CONTRAST TECHNIQUE: Multidetector CT imaging of the chest was performed using the standard protocol during bolus administration of intravenous contrast. Multiplanar CT image reconstructions and MIPs were obtained to evaluate the vascular anatomy. CONTRAST:  160mL OMNIPAQUE IOHEXOL 350 MG/ML SOLN COMPARISON:  Chest x-ray dated 04/12/2019 and 04/03/2019 and chest CT dated 04/03/2019 FINDINGS: Cardiovascular: The patient has developed  numerous bilateral pulmonary emboli to all lobes of both lungs, most extensive in the right lung. Elevated RV LV ratio of 1.1. Aortic atherosclerosis. Coronary artery calcifications. No pericardial effusion. Mediastinum/Nodes: No enlarged  mediastinal, hilar, or axillary lymph nodes. Thyroid gland, trachea, and esophagus demonstrate no significant findings. Lungs/Pleura: Extensive primarily peripheral pulmonary infiltrates are minimally changed since the prior study. No effusions. Upper Abdomen: No acute abnormality. Musculoskeletal: No chest wall abnormality. No acute or significant osseous findings. Review of the MIP images confirms the above findings. IMPRESSION: Extensive bilateral pulmonary emboli with elevated RV LV ratio of 1.1. No significant change in the extensive peripheral bilateral pulmonary infiltrates. Aortic Atherosclerosis (ICD10-I70.0). Electronically Signed   By: Lorriane Shire M.D.   On: 04/12/2019 15:53   VAS [place] IVC/ILIAC (VENOUS ONLY)  Result Date: 04/12/2019 IVC/ILIAC STUDY Indications: DVT left leg Limitations: Air/bowel gas.  Performing Technologist: Baldwin Crown ARDMS, RVT  Examination Guidelines: A complete evaluation includes B-mode imaging, spectral Doppler, color Doppler, and power Doppler as needed of all accessible portions of each vessel. Bilateral testing is considered an integral part of a complete examination. Limited examinations for reoccurring indications may be performed as noted.  IVC/Iliac Findings: +----------+------+--------+--------+     IVC     Patent Thrombus Comments  +----------+------+--------+--------+  IVC Distal patent  acute             +----------+------+--------+--------+  +---------------+---------+-----------+---------+-----------+-----------------+        CIV       RT-Patent RT-Thrombus LT-Patent LT-Thrombus     Comments       +---------------+---------+-----------+---------+-----------+-----------------+  Common Iliac                                                 Not visualized     Mid                                                         due to overlying                                                                    bowel/gas       +---------------+---------+-----------+---------+-----------+-----------------+  +-------------------------+---------+-----------+---------+-----------+--------+             EIV            RT-Patent RT-Thrombus LT-Patent LT-Thrombus Comments  +-------------------------+---------+-----------+---------+-----------+--------+  External Iliac Vein Mid                                      acute              +-------------------------+---------+-----------+---------+-----------+--------+  External Iliac Vein                                          acute  Distal                                                                          +-------------------------+---------+-----------+---------+-----------+--------+   Summary: IVC/Iliac: There is evidence of acute thrombus involving the IVC. There is evidence of acute thrombus involving the left external iliac vein.  *See table(s) above for measurements and observations.   Preliminary    DG Chest Port 1 View  Result Date: 04/12/2019 CLINICAL DATA:  Weakness, shortness of breath EXAM: PORTABLE CHEST 1 VIEW COMPARISON:  04/03/2019 FINDINGS: Patchy airspace disease within the lungs bilaterally concerning for pneumonia. This has increased since prior study, particularly in the left lung. Heart is normal size. No effusions or acute bony abnormality. IMPRESSION: Progressive patchy bilateral airspace disease, particularly in the left lung concerning for pneumonia. Electronically Signed   By: Rolm Baptise M.D.   On: 04/12/2019 10:43   VAS [place] LOWER EXTREMITY VENOUS (DVT) (MC and WL 7a-7p)  Result Date: 04/12/2019  Lower Venous DVTStudy Indications: Pain, and Swelling.  Comparison Study: LEV 04-04-19, negative. Performing Technologist: Baldwin Crown ARDMS, RVT  Examination Guidelines: A complete evaluation includes B-mode imaging, spectral Doppler, color Doppler, and power Doppler as needed of all accessible portions of each vessel. Bilateral testing is considered  an integral part of a complete examination. Limited examinations for reoccurring indications may be performed as noted. The reflux portion of the exam is performed with the patient in reverse Trendelenburg.  +-----+---------------+---------+-----------+----------+--------------+  RIGHT Compressibility Phasicity Spontaneity Properties Thrombus Aging  +-----+---------------+---------+-----------+----------+--------------+  CFV   Full            Yes       Yes                                    +-----+---------------+---------+-----------+----------+--------------+   +---------+---------------+---------+-----------+----------+--------------+  LEFT      Compressibility Phasicity Spontaneity Properties Thrombus Aging  +---------+---------------+---------+-----------+----------+--------------+  CFV       None            No        No                     Acute           +---------+---------------+---------+-----------+----------+--------------+  SFJ       None                                                             +---------+---------------+---------+-----------+----------+--------------+  FV Prox   None                                                             +---------+---------------+---------+-----------+----------+--------------+  FV Mid    None                                                             +---------+---------------+---------+-----------+----------+--------------+  FV Distal None                                                             +---------+---------------+---------+-----------+----------+--------------+  PFV       None                                                             +---------+---------------+---------+-----------+----------+--------------+  POP       None            No        No                                     +---------+---------------+---------+-----------+----------+--------------+  PTV       None                                                              +---------+---------------+---------+-----------+----------+--------------+  PERO      None                                                             +---------+---------------+---------+-----------+----------+--------------+  Gastroc   None                                                             +---------+---------------+---------+-----------+----------+--------------+  EIV       None                                                             +---------+---------------+---------+-----------+----------+--------------+     Summary: RIGHT: - No evidence of common femoral vein obstruction.  LEFT: - Findings consistent with acute deep vein thrombosis involving the left common femoral vein, SF junction, left femoral vein, left proximal profunda vein, left popliteal vein, left posterior tibial veins, left peroneal veins, left gastrocnemius veins, and EIV. - No cystic structure found in the popliteal fossa.  *See table(s) above for measurements and observations.    Preliminary     Procedures Procedures (including critical care time) CRITICAL CARE Performed by: Wenda Overland Rayen Dafoe   Total critical care time: 60 minutes  Critical care time was exclusive of separately billable procedures and treating other patients.  Critical care was necessary to treat or prevent imminent  or life-threatening deterioration.  Critical care was time spent personally by me on the following activities: development of treatment plan with patient and/or surrogate as well as nursing, discussions with consultants, evaluation of patient's response to treatment, examination of patient, obtaining history from patient or surrogate, ordering and performing treatments and interventions, ordering and review of laboratory studies, ordering and review of radiographic studies, pulse oximetry and re-evaluation of patient's condition.  Medications Ordered in ED Medications  heparin ADULT infusion 100 units/mL (25000 units/260mL sodium  chloride 0.45%) (1,500 Units/hr Intravenous New Bag/Given 04/12/19 1249)  heparin bolus via infusion 5,500 Units (5,500 Units Intravenous Bolus from Bag 04/12/19 1250)  iohexol (OMNIPAQUE) 350 MG/ML injection 100 mL (100 mLs Intravenous Contrast Given 04/12/19 1500)    ED Course  I have reviewed the triage vital signs and the nursing notes.  Pertinent labs & imaging results that were available during my care of the patient were reviewed by me and considered in my medical decision making (see chart for details).    MDM Rules/Calculators/A&P                      VSS on arrival. Leg exam very concerning for DVT. Given SOB and intermittent CP, I am also concerned about PE. DDx also includes heart failure, infection such as UTI or worsening pneumonia, dehydration.  Labs show INR 1.3, normal Cr. WBC 20.1, trop 494, BNP 676. US shows extensive clot extending up proximal leg to IVC. Discussed w/ vascular surgery, Dr. Carlis Abbott, who will see in consultation but agrees w/ plan for Heparin for now.   Concern for extensive PE given signs of heart failure. CT chest shows extensive clot burden w/ elevated RV:LV ratio. Discussed w/ CMM and they will admit for further care.  Edward Mejia was evaluated in Emergency Department on 04/12/2019 for the symptoms described in the history of present illness. He was evaluated in the context of the global COVID-19 pandemic, which necessitated consideration that the patient might be at risk for infection with the SARS-CoV-2 virus that causes COVID-19. Institutional protocols and algorithms that pertain to the evaluation of patients at risk for COVID-19 are in a state of rapid change based on information released by regulatory bodies including the CDC and federal and state organizations. These policies and algorithms were followed during the patient's care in the ED.  Final Clinical Impression(s) / ED Diagnoses Final diagnoses:  None    Rx / DC Orders ED Discharge Orders      None       Aunika Kirsten, Wenda Overland, MD 04/12/19 1624

## 2019-04-12 NOTE — Progress Notes (Signed)
ANTICOAGULATION CONSULT NOTE - Initial Consult  Pharmacy Consult for heparin Indication: pulmonary embolus  Allergies  Allergen Reactions  . Pravachol [Pravastatin] Other (See Comments)    Fatigue    Patient Measurements: Height: 6' (182.9 cm) Weight: 220 lb (99.8 kg) IBW/kg (Calculated) : 77.6 Heparin Dosing Weight: 97kg  Vital Signs: Temp: 97.4 F (36.3 C) (03/05 1002) Temp Source: Oral (03/05 1002) BP: 133/89 (03/05 1002) Pulse Rate: 96 (03/05 1002)  Labs: Recent Labs    04/12/19 1045  HGB 18.7*  HCT 56.7*  PLT 228    Estimated Creatinine Clearance: 67.2 mL/min (by C-G formula based on SCr of 1.18 mg/dL).   Medical History: Past Medical History:  Diagnosis Date  . Arthritis   . Cancer ([place])    skin cancer  . Cellulitis   . Cellulitis   . H/O adenomatous polyp of colon 03/24/2015  . History of brain disorder: history of amaurosis fugax 03/24/2015  . History of rheumatic fever 03/24/2015   DID Have Rheumatic Fever.    . Hypertension   . Peptic ulcer disease    Assessment: 70 YOM presenting with SOB, LLE, CP, and recent COVID infection.  No anticoagulation PTA,   Goal of Therapy:  Heparin level 0.3-0.7 units/ml Monitor platelets by anticoagulation protocol: Yes   Plan:  Heparin 5500 units IV x 1, and gtt at 1500 units/hr F/u 8 hour heparin level F/u PE workup  Bertis Ruddy, PharmD Clinical Pharmacist ED Pharmacist Phone # [phone] 04/12/2019 12:14 PM

## 2019-04-12 NOTE — Progress Notes (Signed)
Pharmacy Antibiotic Note  Edward Mejia is a 74 y.o. male admitted on 04/12/2019 with pneumonia.  Pharmacy has been consulted for vancomycin and zosyn dosing. Pt is afebrile but WBC is elevated at 20.1. SCr is WNL. Recent hospital discharge for COVID-19.   Plan: Vancomycin 2gm IV x 1 then 1250mg  IV Q12H  Zosyn 3.375gm IV Q8H (4 hr inf) F/u renal fxn, C&S, clinical status and peak/trough at SS  Height: 6' (182.9 cm) Weight: 220 lb (99.8 kg) IBW/kg (Calculated) : 77.6  Temp (24hrs), Avg:97.4 F (36.3 C), Min:97.4 F (36.3 C), Max:97.4 F (36.3 C)  Recent Labs  Lab 04/06/19 0412 04/07/19 0317 04/12/19 1045  WBC 7.4 7.9 20.1*  CREATININE 1.30* 1.18 0.91    Estimated Creatinine Clearance: 87.1 mL/min (by C-G formula based on SCr of 0.91 mg/dL).    Allergies  Allergen Reactions  . Pravachol [Pravastatin] Other (See Comments)    Caused FATIGUE    Antimicrobials this admission: Vanc 3/5>> Zosyn 3/5>>  Dose adjustments this admission: N/A  Microbiology results: Pending  Thank you for allowing pharmacy to be a part of this patient's care.  Terran Hollenkamp, Rande Lawman 04/12/2019 6:06 PM

## 2019-04-12 NOTE — ED Notes (Signed)
Phlebotomy consulted, will attempt blood draw while in process for IV team consult

## 2019-04-12 NOTE — ED Notes (Signed)
Attempted to start 2nd IV accessx2, unable to obtain.

## 2019-04-12 NOTE — Progress Notes (Addendum)
ANTICOAGULATION CONSULT NOTE - Follow Up Consult  Pharmacy Consult for Heparin Indication: pulmonary embolus  Allergies  Allergen Reactions  . Pravachol [Pravastatin] Other (See Comments)    Caused FATIGUE    Patient Measurements: Height: 6' (182.9 cm) Weight: 220 lb (99.8 kg) IBW/kg (Calculated) : 77.6 Heparin Dosing Weight: 97 kg  Vital Signs: Temp: 97.7 F (36.5 C) (03/05 2115) Temp Source: Oral (03/05 2115) BP: 132/76 (03/05 2115) Pulse Rate: 96 (03/05 2115)  Labs: Recent Labs    04/12/19 1045 04/12/19 1227 04/12/19 2023  HGB 18.7*  --   --   HCT 56.7*  --   --   PLT 228  --   --   LABPROT  --  16.2*  --   INR  --  1.3*  --   HEPARINUNFRC  --   --  <0.10*  CREATININE 0.91  --   --   TROPONINIHS 494*  --  356*    Estimated Creatinine Clearance: 87.1 mL/min (by C-G formula based on SCr of 0.91 mg/dL).   Medical History: Past Medical History:  Diagnosis Date  . Arthritis   . Cancer ([place])    skin cancer  . Cellulitis   . Cellulitis   . H/O adenomatous polyp of colon 03/24/2015  . History of brain disorder: history of amaurosis fugax 03/24/2015  . History of rheumatic fever 03/24/2015   DID Have Rheumatic Fever.    . Hypertension   . Peptic ulcer disease    Assessment: 74 yr old male presented with SOB, LLE, CP, and recent COVID infection; diagnosed with bilateral PE with R heart strain and extensive LLE DVT.  No anticoagulation PTA.   Heparin level ~7.5 hrs after heparin 5500 units IV bolus, followed by heparin infusion at 1500 units/hr, was <0.10 units/ml, which is below the goal range for this pt. H/H 18.7/56.7, platelets 228. Per RN, no issues with IV or bleeding observed.  Goal of Therapy:  Heparin level 0.3-0.7 units/ml Monitor platelets by anticoagulation protocol: Yes   Plan:  Heparin 2900 units IV bolus X 1 Increase heparin infusion to 1900 units/hr Check heparin level in 8 hrs Monitor daily heparin level, CBC Monitor for signs/symptoms of  bleeding  Gillermina Hu, PharmD, BCPS, [facility] Clinical Pharmacist 04/12/2019 10:14 PM

## 2019-04-12 NOTE — ED Triage Notes (Addendum)
States he was dx. Dema Severin covid 2/17 and was in [place] was discharged Sunday 2/28, wife states he has been progressive declining since he was discharge . States Sunday and Monday was eating . Left leg swelling

## 2019-04-13 ENCOUNTER — Inpatient Hospital Stay (HOSPITAL_COMMUNITY): Payer: Medicare Other

## 2019-04-13 DIAGNOSIS — I361 Nonrheumatic tricuspid (valve) insufficiency: Secondary | ICD-10-CM

## 2019-04-13 LAB — COMPREHENSIVE METABOLIC PANEL
ALT: 23 U/L (ref 0–44)
AST: 23 U/L (ref 15–41)
Albumin: 2.5 g/dL — ABNORMAL LOW (ref 3.5–5.0)
Alkaline Phosphatase: 47 U/L (ref 38–126)
Anion gap: 16 — ABNORMAL HIGH (ref 5–15)
BUN: 17 mg/dL (ref 8–23)
CO2: 17 mmol/L — ABNORMAL LOW (ref 22–32)
Calcium: 7.6 mg/dL — ABNORMAL LOW (ref 8.9–10.3)
Chloride: 100 mmol/L (ref 98–111)
Creatinine, Ser: 1.13 mg/dL (ref 0.61–1.24)
GFR calc Af Amer: >60 mL/min (ref >60)
GFR calc non Af Amer: >60 mL/min (ref >60)
Glucose, Bld: 90 mg/dL (ref 70–99)
Potassium: 4.1 mmol/L (ref 3.5–5.1)
Sodium: 133 mmol/L — ABNORMAL LOW (ref 135–145)
Total Bilirubin: 2.4 mg/dL — ABNORMAL HIGH (ref 0.3–1.2)
Total Protein: 5.2 g/dL — ABNORMAL LOW (ref 6.5–8.1)

## 2019-04-13 LAB — TYPE AND SCREEN
ABO/RH(D): A POS
Antibody Screen: NEGATIVE

## 2019-04-13 LAB — D-DIMER, QUANTITATIVE: D-Dimer, Quant: 2.89 ug/mL-FEU — ABNORMAL HIGH (ref 0.00–0.50)

## 2019-04-13 LAB — CBC
HCT: 53.3 % — ABNORMAL HIGH (ref 39.0–52.0)
Hemoglobin: 17.4 g/dL — ABNORMAL HIGH (ref 13.0–17.0)
MCH: 27.9 pg (ref 26.0–34.0)
MCHC: 32.6 g/dL (ref 30.0–36.0)
MCV: 85.4 fL (ref 80.0–100.0)
Platelets: 247 10*3/uL (ref 150–400)
RBC: 6.24 MIL/uL — ABNORMAL HIGH (ref 4.22–5.81)
RDW: 16.2 % — ABNORMAL HIGH (ref 11.5–15.5)
WBC: 23.9 10*3/uL — ABNORMAL HIGH (ref 4.0–10.5)
nRBC: 0 % (ref 0.0–0.2)

## 2019-04-13 LAB — C-REACTIVE PROTEIN: CRP: 16.6 mg/dL — ABNORMAL HIGH (ref <1.0)

## 2019-04-13 LAB — TROPONIN I (HIGH SENSITIVITY): Troponin I (High Sensitivity): 317 ng/L (ref <18)

## 2019-04-13 LAB — ECHOCARDIOGRAM COMPLETE
Height: 72 in
Weight: 3400.38 oz

## 2019-04-13 LAB — FERRITIN: Ferritin: 626 ng/mL — ABNORMAL HIGH (ref 24–336)

## 2019-04-13 LAB — PHOSPHORUS: Phosphorus: 1.9 mg/dL — ABNORMAL LOW (ref 2.5–4.6)

## 2019-04-13 LAB — HEPARIN LEVEL (UNFRACTIONATED): Heparin Unfractionated: 0.29 IU/mL — ABNORMAL LOW (ref 0.30–0.70)

## 2019-04-13 LAB — MAGNESIUM: Magnesium: 1.8 mg/dL (ref 1.7–2.4)

## 2019-04-13 LAB — PROCALCITONIN: Procalcitonin: 0.24 ng/mL

## 2019-04-13 MED ORDER — DEXAMETHASONE SODIUM PHOSPHATE 10 MG/ML IJ SOLN
10.0000 mg | INTRAMUSCULAR | Status: DC
  Administered 2019-04-13: 10 mg via INTRAVENOUS
  Filled 2019-04-13: qty 1

## 2019-04-13 MED ORDER — SODIUM CHLORIDE 0.9 % IV SOLN
INTRAVENOUS | Status: DC
  Administered 2019-04-13: 600 mL via INTRAVENOUS

## 2019-04-13 MED ORDER — VANCOMYCIN HCL IN DEXTROSE 1-5 GM/200ML-% IV SOLN: 1000.0000 mg | Freq: Two times a day (BID) | INTRAVENOUS | Status: DC

## 2019-04-13 MED ORDER — DEXAMETHASONE SODIUM PHOSPHATE 4 MG/ML IJ SOLN: 2.0000 mg | INTRAMUSCULAR | Status: DC

## 2019-04-13 MED ORDER — ENOXAPARIN SODIUM 100 MG/ML ~~LOC~~ SOLN
95.0000 mg | Freq: Two times a day (BID) | SUBCUTANEOUS | Status: AC
  Administered 2019-04-13 – 2019-04-15 (×6): 95 mg via SUBCUTANEOUS
  Filled 2019-04-13 (×6): qty 1

## 2019-04-13 MED ORDER — HYDROCORTISONE ACETATE 25 MG RE SUPP
25.0000 mg | Freq: Two times a day (BID) | RECTAL | Status: DC
  Administered 2019-04-13 – 2019-04-16 (×7): 25 mg via RECTAL
  Filled 2019-04-13 (×7): qty 1

## 2019-04-13 MED ORDER — SODIUM PHOSPHATES 45 MMOLE/15ML IV SOLN
30.0000 mmol | Freq: Once | INTRAVENOUS | Status: AC
  Administered 2019-04-13: 30 mmol via INTRAVENOUS
  Filled 2019-04-13: qty 10

## 2019-04-13 NOTE — Progress Notes (Signed)
PROGRESS NOTE                                                                                                                                                                                                             Patient Demographics:    Edward Mejia, is a 74 y.o. male, DOB - 1945-08-19, TJ:[id number]  Outpatient Primary MD for the patient is Caryn Section Kirstie Peri, MD    LOS - 1  Admit date - 04/12/2019    Chief Complaint  Patient presents with  . Weakness       Brief Narrative  Edward Mejia is a 74 y.o. male with medical history significant of hypertension, hyperlipidemia, recently diagnosed with Covid pneumonia 2 weeks ago and was hospitalized from 04/03/2019 to 04/07/2019 at [facility] presented to emergency department due to left lower extremity swelling, shortness of breath and generalized weakness, work-up was consistent with bilateral large PEs, with large left lower extremity DVT, blood clots in IVC and left external iliac vein.  He was admitted for further care.  He was seen by PCCM and vascular surgery in the ER.   Subjective:    [name] today has, No headache, does have some pleuritic chest pain and shortness of breath, no focal weakness, no abdominal pain.   Assessment  & Plan :      1. Acute Hypoxic Resp. Failure due to Acute bilateral PE - had recently been adequately treated for COVID-19 infection, likely hypercoagulable due to that, currently on therapeutic dose Lovenox, hemodynamically stable, continue oxygen supplementation and supportive care and monitor closely.  Case discussed with vascular surgeon Dr. Carlis Abbott on 04/13/2019.  Encouraged the patient to sit up in chair in the daytime use I-S and flutter valve for pulmonary toiletry and then prone in bed when at night.  SpO2: 93 % O2 Flow Rate (L/min): 1 L/min  Recent Labs  Lab 04/07/19 0317 04/12/19 1045 04/12/19 2023 04/13/19 0418  CRP 1.9*   --  13.7* 16.6*  DDIMER 0.89*  --  2.95* 2.89*  FERRITIN 264  --  496* 626*  BNP  --  676.3*  --   --   PROCALCITON 0.87  --  0.13  --     2.  Recent COVID-19 infection.  Adequately treated.  Supportive care only.  Was diagnosed on 02/17/2019.  No evidence  of acute bacterial infection.  Stop all antibiotics.  3.  Left leg DVT, clot in IVC/left external iliac vein, large bilateral PE.  Full dose Lovenox.  Keep left leg elevated, TED stockings.  Case discussed with vascular surgery as above.  4.  GERD.  PPI and Misoprostol.  5.  History of psoriatic arthritis.  Resume home medications once acute issues have is resolved.    Condition - Extremely Guarded  Family Communication  :  Wife  Code Status :  DNR  Diet :   Diet Order            Diet Heart Room service appropriate? Yes; Fluid consistency: Thin  Diet effective now               Disposition Plan  : Stay in PCU getting treatment for massive PE, DVT involving L.Leg and IVC and left external iliac vein.  Consults  :  VVS  Procedures  :    CTA - Bilat PE  Leg [place]  - RIGHT: - No evidence of common femoral vein obstruction.  LEFT: - Findings consistent with acute deep vein thrombosis involving the left common femoral vein, SF junction, left femoral vein, left proximal profunda vein, left popliteal vein, left posterior tibial veins, left peroneal veins, left gastrocnemius veins, and EIV. - No cystic structure found in the popliteal fossa.  *See table(s) above for measurements and observations  US IVC/Iliac: There is evidence of acute thrombus involving the IVC. There is evidence of acute thrombus involving the left external iliac vein.    PUD Prophylaxis : PPI, Misoprostol   DVT Prophylaxis  :  Lovenox    Lab Results  Component Value Date   PLT 247 04/13/2019    Inpatient Medications  Scheduled Meds: . vitamin C  500 mg Oral Daily  . dexamethasone (DECADRON) injection  10 mg Intravenous Q24H  . enoxaparin  (LOVENOX) injection  95 mg Subcutaneous Q12H  . gabapentin  600 mg Oral Q1400  . HYDROcodone-acetaminophen  1 tablet Oral Q6H  . misoprostol  100 mcg Oral Q lunch  . pantoprazole  40 mg Oral BID AC  . ranolazine  500 mg Oral BID  . zinc sulfate  220 mg Oral Daily   Continuous Infusions: . sodium chloride 75 mL/hr at 04/12/19 2023  . piperacillin-tazobactam (ZOSYN)  IV 3.375 g (04/13/19 0951)  . sodium phosphate  Dextrose 5% IVPB 30 mmol (04/13/19 0950)  . vancomycin     PRN Meds:.acetaminophen **OR** [DISCONTINUED] acetaminophen, albuterol, chlorpheniramine-HYDROcodone, guaiFENesin-dextromethorphan, nitroGLYCERIN, [DISCONTINUED] ondansetron **OR** ondansetron (ZOFRAN) IV, oxyCODONE-acetaminophen, senna-docusate  Antibiotics  :    Anti-infectives (From admission, onward)   Start     Dose/Rate Route Frequency Ordered Stop   04/13/19 2000  vancomycin (VANCOCIN) IVPB 1000 mg/200 mL premix     1,000 mg 200 mL/hr over 60 Minutes Intravenous Every 12 hours 04/13/19 0847     04/13/19 0800  vancomycin (VANCOREADY) IVPB 1250 mg/250 mL  Status:  Discontinued     1,250 mg 166.7 mL/hr over 90 Minutes Intravenous Every 12 hours 04/12/19 1805 04/13/19 0847   04/13/19 0100  piperacillin-tazobactam (ZOSYN) IVPB 3.375 g     3.375 g 12.5 mL/hr over 240 Minutes Intravenous Every 8 hours 04/12/19 1805     04/12/19 1815  vancomycin (VANCOREADY) IVPB 2000 mg/400 mL     2,000 mg 200 mL/hr over 120 Minutes Intravenous  Once 04/12/19 1801 04/13/19 0102   04/12/19 1815  piperacillin-tazobactam (ZOSYN) IVPB 3.375 g  3.375 g 100 mL/hr over 30 Minutes Intravenous  Once 04/12/19 1801 04/12/19 2314       Time Spent in minutes  30   Lala Lund M.D on 04/13/2019 at 10:08 AM  To page go to [email or web address] - password [facility]  [facility] -  Office  [phone]   See all Orders from today for further details    Objective:   Vitals:   04/13/19 0400 04/13/19 0407 04/13/19 0658 04/13/19 0800    BP: 132/77   123/77  Pulse: 93  90 91  Resp: (!) 29  (!) 21 (!) 21  Temp: 97.8 F (36.6 C)   97.9 F (36.6 C)  TempSrc: Oral   Oral  SpO2: 94%  92% 93%  Weight:  96.4 kg    Height:        Wt Readings from Last 3 Encounters:  04/13/19 96.4 kg  04/03/19 93.3 kg  04/02/19 99.8 kg     Intake/Output Summary (Last 24 hours) at 04/13/2019 1008 Last data filed at 04/13/2019 0746 Gross per 24 hour  Intake 1264.78 ml  Output --  Net 1264.78 ml     Physical Exam  Awake Alert, No new F.N deficits, Normal affect [place].AT,PERRAL Supple Neck,No JVD, No cervical lymphadenopathy appriciated.  Symmetrical Chest wall movement, Good air movement bilaterally, CTAB RRR,No Gallops,Rubs or new Murmurs, No Parasternal Heave +ve B.Sounds, Abd Soft, No tenderness, No organomegaly appriciated, No rebound - guarding or rigidity. No Cyanosis, L. Leg is swollen    Data Review:    CBC Recent Labs  Lab 04/06/19 1944 04/07/19 0317 04/07/19 1100 04/12/19 1045 04/13/19 0418  WBC  --  7.9  --  20.1* 23.9*  HGB 16.0 15.3 16.9 18.7* 17.4*  HCT 48.8 46.4 49.7 56.7* 53.3*  PLT  --  359  --  228 247  MCV  --  85.5  --  85.8 85.4  MCH  --  28.2  --  28.3 27.9  MCHC  --  33.0  --  33.0 32.6  RDW  --  15.1  --  17.5* 16.2*  LYMPHSABS  --  0.9  --   --   --   MONOABS  --  0.6  --   --   --   EOSABS  --  0.0  --   --   --   BASOSABS  --  0.0  --   --   --     Chemistries  Recent Labs  Lab 04/07/19 0317 04/12/19 1045 04/12/19 2023 04/13/19 0418  NA 137 131*  --  133*  K 3.9 5.1  --  4.1  CL 107 101  --  100  CO2 20* 16*  --  17*  GLUCOSE 120* 102*  --  90  BUN 32* 17  --  17  CREATININE 1.18 0.91  --  1.13  CALCIUM 7.6* 8.0*  --  7.6*  MG 2.1  --  1.8 1.8  AST 46* 29  --  23  ALT 41 23  --  23  ALKPHOS 31* 49  --  47  BILITOT 1.2 2.6*  --  2.4*   ------------------------------------------------------------------------------------------------------------------ No results for input(s):  CHOL, HDL, LDLCALC, TRIG, CHOLHDL, LDLDIRECT in the last 72 hours.  Lab Results  Component Value Date   HGBA1C 5.8 (H) 04/04/2019   ------------------------------------------------------------------------------------------------------------------ No results for input(s): TSH, T4TOTAL, T3FREE, THYROIDAB in the last 72 hours.  Invalid input(s): FREET3  Cardiac Enzymes No results for input(s): CKMB,  TROPONINI, MYOGLOBIN in the last 168 hours.  Invalid input(s): CK ------------------------------------------------------------------------------------------------------------------    Component Value Date/Time   BNP 676.3 (H) 04/12/2019 1045    Micro Results Recent Results (from the past 240 hour(s))  Culture, Urine     Status: Abnormal   Collection Time: 04/03/19 10:03 PM   Specimen: Urine  Result Value Ref Range Status   Specimen Description   Final    Urine Performed at [facility] [address] [place], [place] [address]    Special Requests   Final    NONE Performed at [facility], [place] [address] [place], [place] [address]    Culture (A)  Final    <10,000 COLONIES/mL INSIGNIFICANT GROWTH Performed at [facility] [address], [place], [place] [address]    Report Status 04/04/2019 FINAL  Final  MRSA PCR Screening     Status: None   Collection Time: 04/04/19  8:09 AM   Specimen: Nasopharyngeal  Result Value Ref Range Status   MRSA by PCR NEGATIVE NEGATIVE Final    Comment:        The GeneXpert MRSA Assay (FDA approved for NASAL specimens only), is one component of a comprehensive MRSA colonization surveillance program. It is not intended to diagnose MRSA infection nor to guide or monitor treatment for MRSA infections. Performed at [facility], [place] [address], [place], [place] [address]     Radiology Reports DG Chest 2 View  Result Date: 04/02/2019 CLINICAL DATA:  74 year old male with  positive COVID-19 with worsening fatigue and weakness. EXAM: CHEST - 2 VIEW COMPARISON:  Chest radiograph dated 10/20/2017. FINDINGS: Bilateral diffuse hazy and streaky densities most consistent with multifocal pneumonia and in keeping with COVID-19. No lobar consolidation, pleural effusion, pneumothorax. Mild cardiomegaly. Atherosclerotic calcification of the aorta. No acute osseous pathology. Upper abdominal surgical clips. IMPRESSION: Multifocal pneumonia. Clinical correlation and follow-up to resolution recommended. Electronically Signed   By: Anner Crete M.D.   On: 04/02/2019 18:37   CT Angio Chest PE W/Cm &/Or Wo Cm  Result Date: 04/12/2019 CLINICAL DATA:  Progressive shortness of breath. COVID-19. EXAM: CT ANGIOGRAPHY CHEST WITH CONTRAST TECHNIQUE: Multidetector CT imaging of the chest was performed using the standard protocol during bolus administration of intravenous contrast. Multiplanar CT image reconstructions and MIPs were obtained to evaluate the vascular anatomy. CONTRAST:  139mL OMNIPAQUE IOHEXOL 350 MG/ML SOLN COMPARISON:  Chest x-ray dated 04/12/2019 and 04/03/2019 and chest CT dated 04/03/2019 FINDINGS: Cardiovascular: The patient has developed numerous bilateral pulmonary emboli to all lobes of both lungs, most extensive in the right lung. Elevated RV LV ratio of 1.1. Aortic atherosclerosis. Coronary artery calcifications. No pericardial effusion. Mediastinum/Nodes: No enlarged mediastinal, hilar, or axillary lymph nodes. Thyroid gland, trachea, and esophagus demonstrate no significant findings. Lungs/Pleura: Extensive primarily peripheral pulmonary infiltrates are minimally changed since the prior study. No effusions. Upper Abdomen: No acute abnormality. Musculoskeletal: No chest wall abnormality. No acute or significant osseous findings. Review of the MIP images confirms the above findings. IMPRESSION: Extensive bilateral pulmonary emboli with elevated RV LV ratio of 1.1. No significant  change in the extensive peripheral bilateral pulmonary infiltrates. Aortic Atherosclerosis (ICD10-I70.0). Electronically Signed   By: Lorriane Shire M.D.   On: 04/12/2019 15:53   CT ANGIO CHEST PE W OR WO CONTRAST  Result Date: 04/03/2019 CLINICAL DATA:  Hypoxemia. COVID-19 infection. EXAM: CT ANGIOGRAPHY CHEST WITH CONTRAST TECHNIQUE: Multidetector CT imaging of the chest was performed using the standard protocol  during bolus administration of intravenous contrast. Multiplanar CT image reconstructions and MIPs were obtained to evaluate the vascular anatomy. CONTRAST:  10mL OMNIPAQUE IOHEXOL 350 MG/ML SOLN COMPARISON:  CT 01/01/2018. Radiographs 04/03/2019 and 04/02/2019. FINDINGS: Cardiovascular: The pulmonary arteries are well opacified with contrast to the level of the subsegmental branches. Subsegmental branch valuation is mildly limited by breathing artifact. No evidence of acute pulmonary embolism. There is atherosclerosis of the aorta, great vessels and coronary arteries. The heart size is normal. There is no pericardial effusion. Mediastinum/Nodes: There are no enlarged mediastinal, hilar or axillary lymph nodes. The thyroid gland, trachea and esophagus demonstrate no significant findings. Lungs/Pleura: No pleural effusion or pneumothorax. Multifocal ground-glass and airspace opacities are present peripherally in both lungs consistent with viral pneumonia. In addition, there are mild dependent opacities in both lower lobes which are likely due to atelectasis. No endobronchial lesion identified. Upper abdomen: No acute findings are seen within the visualized upper abdomen. There are postsurgical changes within the stomach consistent with gastric bypass. Possible mild dilatation of the ventral pancreatic duct in the pancreatic head (image 155/4), similar to previous study. Musculoskeletal/Chest wall: There is no chest wall mass or suspicious osseous finding. Review of the MIP images confirms the above  findings. IMPRESSION: 1. No evidence of acute pulmonary embolism. 2. Multifocal ground-glass and airspace opacities peripherally in both lungs consistent with viral pneumonia. 3. Coronary and aortic Atherosclerosis (ICD10-I70.0). Electronically Signed   By: Richardean Sale M.D.   On: 04/03/2019 17:36   VAS [place] IVC/ILIAC (VENOUS ONLY)  Result Date: 04/12/2019 IVC/ILIAC STUDY Indications: DVT left leg Limitations: Air/bowel gas.  Performing Technologist: Baldwin Crown ARDMS, RVT  Examination Guidelines: A complete evaluation includes B-mode imaging, spectral Doppler, color Doppler, and power Doppler as needed of all accessible portions of each vessel. Bilateral testing is considered an integral part of a complete examination. Limited examinations for reoccurring indications may be performed as noted.  IVC/Iliac Findings: +----------+------+--------+--------+    IVC    PatentThrombusComments +----------+------+--------+--------+ IVC Distalpatent acute           +----------+------+--------+--------+  +---------------+---------+-----------+---------+-----------+-----------------+       CIV      RT-PatentRT-ThrombusLT-PatentLT-Thrombus    Comments      +---------------+---------+-----------+---------+-----------+-----------------+ Common Iliac                                            Not visualized   Mid                                                    due to overlying                                                             bowel/gas     +---------------+---------+-----------+---------+-----------+-----------------+  +-------------------------+---------+-----------+---------+-----------+--------+            EIV           RT-PatentRT-ThrombusLT-PatentLT-ThrombusComments +-------------------------+---------+-----------+---------+-----------+--------+ External Iliac Vein Mid  acute             +-------------------------+---------+-----------+---------+-----------+--------+ External Iliac Vein                                      acute            Distal                                                                    +-------------------------+---------+-----------+---------+-----------+--------+   Summary: IVC/Iliac: There is evidence of acute thrombus involving the IVC. There is evidence of acute thrombus involving the left external iliac vein.  *See table(s) above for measurements and observations.  Electronically signed by Monica Martinez MD on 04/12/2019 at 4:39:37 PM.   Final    DG Chest Port 1 View  Result Date: 04/12/2019 CLINICAL DATA:  Weakness, shortness of breath EXAM: PORTABLE CHEST 1 VIEW COMPARISON:  04/03/2019 FINDINGS: Patchy airspace disease within the lungs bilaterally concerning for pneumonia. This has increased since prior study, particularly in the left lung. Heart is normal size. No effusions or acute bony abnormality. IMPRESSION: Progressive patchy bilateral airspace disease, particularly in the left lung concerning for pneumonia. Electronically Signed   By: Rolm Baptise M.D.   On: 04/12/2019 10:43   DG Chest Port 1 View  Result Date: 04/03/2019 CLINICAL DATA:  Shortness of breath in a patient who is COVID-19 positive. EXAM: PORTABLE CHEST 1 VIEW COMPARISON:  PA and lateral chest 04/02/2019 and 05/01/2017. FINDINGS: Patchy bilateral airspace disease seen on the most recent examination persists without notable change. Heart size is normal. No pneumothorax or pleural fluid. No acute or focal bony abnormality. IMPRESSION: No change of patchy bilateral airspace disease consistent with pneumonia. Electronically Signed   By: Inge Rise M.D.   On: 04/03/2019 10:14   VAS [place] LOWER EXTREMITY VENOUS (DVT) (MC and WL 7a-7p)  Result Date: 04/12/2019  Lower Venous DVTStudy Indications: Pain, and Swelling.  Comparison Study: LEV 04-04-19, negative. Performing  Technologist: Baldwin Crown ARDMS, RVT  Examination Guidelines: A complete evaluation includes B-mode imaging, spectral Doppler, color Doppler, and power Doppler as needed of all accessible portions of each vessel. Bilateral testing is considered an integral part of a complete examination. Limited examinations for reoccurring indications may be performed as noted. The reflux portion of the exam is performed with the patient in reverse Trendelenburg.  +-----+---------------+---------+-----------+----------+--------------+ RIGHTCompressibilityPhasicitySpontaneityPropertiesThrombus Aging +-----+---------------+---------+-----------+----------+--------------+ CFV  Full           Yes      Yes                                 +-----+---------------+---------+-----------+----------+--------------+   +---------+---------------+---------+-----------+----------+--------------+ LEFT     CompressibilityPhasicitySpontaneityPropertiesThrombus Aging +---------+---------------+---------+-----------+----------+--------------+ CFV      None           No       No                   Acute          +---------+---------------+---------+-----------+----------+--------------+ SFJ      None                                                        +---------+---------------+---------+-----------+----------+--------------+  FV Prox  None                                                        +---------+---------------+---------+-----------+----------+--------------+ FV Mid   None                                                        +---------+---------------+---------+-----------+----------+--------------+ FV DistalNone                                                        +---------+---------------+---------+-----------+----------+--------------+ PFV      None                                                         +---------+---------------+---------+-----------+----------+--------------+ POP      None           No       No                                  +---------+---------------+---------+-----------+----------+--------------+ PTV      None                                                        +---------+---------------+---------+-----------+----------+--------------+ PERO     None                                                        +---------+---------------+---------+-----------+----------+--------------+ Gastroc  None                                                        +---------+---------------+---------+-----------+----------+--------------+ EIV      None                                                        +---------+---------------+---------+-----------+----------+--------------+     Summary: RIGHT: - No evidence of common femoral vein obstruction.  LEFT: - Findings consistent with acute deep vein thrombosis involving the left common femoral vein, SF junction, left femoral vein, left proximal profunda vein, left popliteal vein, left posterior tibial veins, left peroneal veins, left gastrocnemius  veins, and EIV. - No cystic structure found in the popliteal fossa.  *See table(s) above for measurements and observations. Electronically signed by Monica Martinez MD on 04/12/2019 at 4:39:23 PM.    Final    VAS [place] LOWER EXTREMITY VENOUS (DVT)  Result Date: 04/04/2019  Lower Venous DVTStudy Indications: Elevated ddimer.  Comparison Study: no prior Performing Technologist: Abram Sander RVS  Examination Guidelines: A complete evaluation includes B-mode imaging, spectral Doppler, color Doppler, and power Doppler as needed of all accessible portions of each vessel. Bilateral testing is considered an integral part of a complete examination. Limited examinations for reoccurring indications may be performed as noted. The reflux portion of the exam is performed with the patient in  reverse Trendelenburg.  +---------+---------------+---------+-----------+----------+--------------+ RIGHT    CompressibilityPhasicitySpontaneityPropertiesThrombus Aging +---------+---------------+---------+-----------+----------+--------------+ CFV      Full           Yes      Yes                                 +---------+---------------+---------+-----------+----------+--------------+ SFJ      Full                                                        +---------+---------------+---------+-----------+----------+--------------+ FV Prox  Full                                                        +---------+---------------+---------+-----------+----------+--------------+ FV Mid   Full                                                        +---------+---------------+---------+-----------+----------+--------------+ FV DistalFull                                                        +---------+---------------+---------+-----------+----------+--------------+ PFV      Full                                                        +---------+---------------+---------+-----------+----------+--------------+ POP      Full           Yes      Yes                                 +---------+---------------+---------+-----------+----------+--------------+ PTV      Full                                                        +---------+---------------+---------+-----------+----------+--------------+  PERO     Full                                                        +---------+---------------+---------+-----------+----------+--------------+   +---------+---------------+---------+-----------+----------+--------------+ LEFT     CompressibilityPhasicitySpontaneityPropertiesThrombus Aging +---------+---------------+---------+-----------+----------+--------------+ CFV      Full           Yes      Yes                                  +---------+---------------+---------+-----------+----------+--------------+ SFJ      Full                                                        +---------+---------------+---------+-----------+----------+--------------+ FV Prox  Full                                                        +---------+---------------+---------+-----------+----------+--------------+ FV Mid   Full                                                        +---------+---------------+---------+-----------+----------+--------------+ FV DistalFull                                                        +---------+---------------+---------+-----------+----------+--------------+ PFV      Full                                                        +---------+---------------+---------+-----------+----------+--------------+ POP      Full           Yes      Yes                                 +---------+---------------+---------+-----------+----------+--------------+ PTV      Full                                                        +---------+---------------+---------+-----------+----------+--------------+ PERO     Full                                                        +---------+---------------+---------+-----------+----------+--------------+  Summary: BILATERAL: - No evidence of deep vein thrombosis seen in the lower extremities, bilaterally.   *See table(s) above for measurements and observations. Electronically signed by Monica Martinez MD on 04/04/2019 at 7:02:03 PM.    Final

## 2019-04-13 NOTE — Progress Notes (Signed)
ANTICOAGULATION CONSULT NOTE - Follow Up Consult  Pharmacy Consult for Heparin Indication: pulmonary embolus  Allergies  Allergen Reactions  . Pravachol [Pravastatin] Other (See Comments)    Caused FATIGUE    Patient Measurements: Height: 6' (182.9 cm) Weight: 212 lb 8.4 oz (96.4 kg) IBW/kg (Calculated) : 77.6 Heparin Dosing Weight: 97 kg  Vital Signs: Temp: 97.8 F (36.6 C) (03/06 0400) Temp Source: Oral (03/06 0400) BP: 132/77 (03/06 0400) Pulse Rate: 93 (03/06 0400)  Labs: Recent Labs    04/12/19 1045 04/12/19 1227 04/12/19 2023 04/12/19 2145 04/13/19 0418  HGB 18.7*  --   --   --  17.4*  HCT 56.7*  --   --   --  53.3*  PLT 228  --   --   --  247  LABPROT  --  16.2*  --   --   --   INR  --  1.3*  --   --   --   HEPARINUNFRC  --   --  <0.10*  --  0.29*  CREATININE 0.91  --   --   --  1.13  TROPONINIHS 494*  --  356* 403*  --     Estimated Creatinine Clearance: 69 mL/min (by C-G formula based on SCr of 1.13 mg/dL).  Assessment: 74 yr old male presented with SOB, LLE, CP, and recent COVID infection; diagnosed with bilateral PE with R heart strain and extensive LLE DVT.  No anticoagulation PTA.   Heparin level remains slightly subtherapeutic (0.29) on gtt at 1900 units/hr. No issues with line or bleeding reported per RN.  Goal of Therapy:  Heparin level 0.3-0.7 units/ml Monitor platelets by anticoagulation protocol: Yes   Plan:  Increase heparin infusion to 2100 units/hr Check heparin level in 8 hrs  Sherlon Handing, PharmD, BCPS Please see amion for complete clinical pharmacist phone list 04/13/2019 6:37 AM

## 2019-04-13 NOTE — Progress Notes (Signed)
Heparin drip was stopped per order at 08:44 o'clock.

## 2019-04-13 NOTE — Progress Notes (Addendum)
Patient out of bed in the chair; he was able to walk few steps from bed to chair. No acute distress noted, no complaints. Oxygen 1 L via [place] - sat O2 94%. Patient's wife was called with updates. All questions were answered. Will continue to monitor.

## 2019-04-13 NOTE — Progress Notes (Signed)
  Echocardiogram 2D Echocardiogram has been performed.  Anamika Kueker A Yojan Paskett 04/13/2019, 9:36 AM

## 2019-04-13 NOTE — Progress Notes (Addendum)
Pharmacy Antibiotic Note  Edward Mejia is a 74 y.o. male admitted on 04/12/2019 with pneumonia.  Pharmacy has been consulted for vancomycin and zosyn dosing. Pt is afebrile but WBC is elevated at 23.9. Recent hospital discharge for COVID-19.   Patient started on vancomycin 1250mg  IV q12h. Scr rose to 1.13 and based on current regimen of vancomycin 1250mg  IV q12h the estimated AUC would be supratherapeutic at 635 (goal 400-550).   Vancomycin 1000 mg IV Q 12 hrs. Goal AUC 400-550. Expected AUC: 509  SCr used: 1.13   Plan: Adjust vancomycin to 1000mg  IV q12h  Zosyn 3.375gm IV Q8H (4 hr inf) F/u renal fxn, C&S, clinical status and peak/trough at SS  Height: 6' (182.9 cm) Weight: 212 lb 8.4 oz (96.4 kg) IBW/kg (Calculated) : 77.6  Temp (24hrs), Avg:97.7 F (36.5 C), Min:97.4 F (36.3 C), Max:97.9 F (36.6 C)  Recent Labs  Lab 04/07/19 0317 04/12/19 1045 04/12/19 2024 04/12/19 2145 04/13/19 0418  WBC 7.9 20.1*  --   --  23.9*  CREATININE 1.18 0.91  --   --  1.13  LATICACIDVEN  --   --  1.8 2.8*  --     Estimated Creatinine Clearance: 69 mL/min (by C-G formula based on SCr of 1.13 mg/dL).    Allergies  Allergen Reactions  . Pravachol [Pravastatin] Other (See Comments)    Caused FATIGUE    Antimicrobials this admission: Vanc 3/5>> Zosyn 3/5>>  Dose adjustments this admission: 3/6: adjust vancomycin from 1250mg  IV q12h to 1000mg  q12h   Microbiology results: 3/6 MRSA PCR:   Thank you for allowing pharmacy to be a part of this patient's care.  Cristela Felt, PharmD PGY1 Pharmacy Resident [employer]: [phone]  04/13/2019 8:41 AM

## 2019-04-13 NOTE — Progress Notes (Signed)
ANTICOAGULATION CONSULT NOTE - Initial Consult  Pharmacy Consult for LMWH Indication: pulmonary embolus  Allergies  Allergen Reactions  . Pravachol [Pravastatin] Other (See Comments)    Caused FATIGUE    Patient Measurements: Height: 6' (182.9 cm) Weight: 212 lb 8.4 oz (96.4 kg) IBW/kg (Calculated) : 77.6 Heparin Dosing Weight: 97 kg  Vital Signs: Temp: 97.8 F (36.6 C) (03/06 0400) Temp Source: Oral (03/06 0400) BP: 132/77 (03/06 0400) Pulse Rate: 90 (03/06 0658)  Labs: Recent Labs    04/12/19 1045 04/12/19 1227 04/12/19 2023 04/12/19 2145 04/13/19 0418  HGB 18.7*  --   --   --  17.4*  HCT 56.7*  --   --   --  53.3*  PLT 228  --   --   --  247  LABPROT  --  16.2*  --   --   --   INR  --  1.3*  --   --   --   HEPARINUNFRC  --   --  <0.10*  --  0.29*  CREATININE 0.91  --   --   --  1.13  TROPONINIHS 494*  --  356* 403*  --     Estimated Creatinine Clearance: 69 mL/min (by C-G formula based on SCr of 1.13 mg/dL).  Assessment: 74 yr old male presented with SOB, LLE, CP, and recent COVID infection. Patient found to have extensive bilateral PE with RV/LV ratio 1.1. Currently on heparin for PE at increased rate of 2100 units/hr after slightly subtherapeutic level of 0.29 on 1900 units/hr. Expect patient is therapeutic on current rate of heparin. Pharmacy consulted to dose enoxaparin for PE. CBC stable. No reported bleeding. CrCl 69 ml/min.    Goal of Therapy:  Anti-Xa level 0.6-1 units/ml 4hrs after LMWH dose given Monitor platelets by anticoagulation protocol: Yes   Plan:  Discontinue heparin infusion and administer enoxaparin dose 1 hour after heparin infusion stopped - RN aware Start enoxaparin 95mg  SQ q12h (1mg /kg q12h)  Monitor S/S of bleeding.   Cristela Felt, PharmD PGY1 Pharmacy Resident [employer]: [phone]  04/13/2019 7:54 AM

## 2019-04-13 NOTE — Evaluation (Signed)
Physical Therapy Evaluation Patient Details Name: Edward Mejia MRN: [id number] DOB: 1945/07/29 Today's Date: 04/13/2019   History of Present Illness   Edward Mejia is a 74 y.o. male with medical history significant of hypertension, hyperlipidemia, recently diagnosed with Covid pneumonia 2 weeks ago and was hospitalized from 04/03/2019 to 04/07/2019 at [facility] presented to emergency department due to left lower extremity swelling, shortness of breath and generalized weakness, work-up was consistent with bilateral large PEs, with large left lower extremity DVT, blood clots in IVC and left external iliac vein.   Clinical Impression  Pt admitted with above diagnosis. Pt presents with weakness and poor tolerance for activity. Min A for sit<>stand transfers and pivot to chair. Pt required seated rest break after pivot to chair. Tolerated standing and seated there ex. SPO2 in 90's on 2L O2, HR <100 bpm, RR elevated throughout.  Pt currently with functional limitations due to the deficits listed below (see PT Problem List). Pt will benefit from skilled PT to increase their independence and safety with mobility to allow discharge to the venue listed below.       Follow Up Recommendations Home health PT;Supervision/Assistance - 24 hour    Equipment Recommendations  None recommended by PT    Recommendations for Other Services       Precautions / Restrictions Precautions Precautions: Fall Restrictions Weight Bearing Restrictions: No      Mobility  Bed Mobility Overal bed mobility: Needs Assistance Bed Mobility: Supine to Sit     Supine to sit: Min guard;HOB elevated     General bed mobility comments: increased time needed to get to EOB, vc's for use of rail and sequencing  Transfers Overall transfer level: Needs assistance Equipment used: Rolling walker (2 wheeled) Transfers: Sit to/from Stand Sit to Stand: Min guard;Min assist Stand pivot transfers: Min assist       General  transfer comment: min-guard A from bed, min A from recliner (lower surface). Min A to steady with pivot to chair, pt very fatigued after pivot and needed to sit to rest, incr RR  Ambulation/Gait             General Gait Details: NT  Stairs            Wheelchair Mobility    Modified Rankin (Stroke Patients Only)       Balance Overall balance assessment: Needs assistance Sitting-balance support: Feet supported Sitting balance-Leahy Scale: Good     Standing balance support: During functional activity;Bilateral upper extremity supported Standing balance-Leahy Scale: Poor Standing balance comment: reliant on UE support for balance and energy conservation                             Pertinent Vitals/Pain Pain Assessment: Faces Faces Pain Scale: Hurts even more Pain Location: low back (has been worse since covid) Pain Descriptors / Indicators: Aching;Sore Pain Intervention(s): Monitored during session;Limited activity within patient's tolerance    Home Living Family/patient expects to be discharged to:: Private residence Living Arrangements: Spouse/significant other Available Help at Discharge: Family;Available 24 hours/day Type of Home: Mobile home Home Access: Stairs to enter   Entrance Stairs-Number of Steps: 3 Home Layout: One level Home Equipment: Cane - single point;Walker - 2 wheels Additional Comments: has been using RW since going home from [facility]. No AD before Covid    Prior Function Level of Independence: Independent         Comments: pt drives  Hand Dominance   Dominant Hand: Right    Extremity/Trunk Assessment   Upper Extremity Assessment Upper Extremity Assessment: Generalized weakness    Lower Extremity Assessment Lower Extremity Assessment: Generalized weakness;LLE deficits/detail LLE Deficits / Details: increased swelling LLE LLE Sensation: decreased light touch;decreased proprioception LLE Coordination:  decreased gross motor    Cervical / Trunk Assessment Cervical / Trunk Assessment: Other exceptions Cervical / Trunk Exceptions: decreased lumbar lordosis  Communication   Communication: HOH  Cognition Arousal/Alertness: Awake/alert Behavior During Therapy: WFL for tasks assessed/performed Overall Cognitive Status: Within Functional Limits for tasks assessed                                 General Comments: repeated self a couple of times but pt was appropriate and processing time was appropriate      General Comments General comments (skin integrity, edema, etc.): SpO2 in mid 90's on 2L O2, HR <100 bpm    Exercises Total Joint Exercises Marching in Standing: AROM;Both;10 reps;Standing General Exercises - Lower Extremity Long Arc Quad: AROM;Both;10 reps;Seated Hip Flexion/Marching: AROM;Both;10 reps;Seated Toe Raises: AROM;Both;10 reps;Seated Heel Raises: AROM;Both;10 reps;Seated   Assessment/Plan    PT Assessment Patient needs continued PT services  PT Problem List Decreased strength;Decreased activity tolerance;Decreased balance;Decreased mobility;Cardiopulmonary status limiting activity;Pain       PT Treatment Interventions DME instruction;Gait training;Functional mobility training;Therapeutic activities;Therapeutic exercise;Balance training;Neuromuscular re-education;Cognitive remediation;Patient/family education;Stair training    PT Goals (Current goals can be found in the Care Plan section)  Acute Rehab PT Goals Patient Stated Goal: to go home PT Goal Formulation: With patient Time For Goal Achievement: 04/27/19 Potential to Achieve Goals: Good    Frequency Min 3X/week   Barriers to discharge        Co-evaluation               AM-PAC PT "6 Clicks" Mobility  Outcome Measure Help needed turning from your back to your side while in a flat bed without using bedrails?: A Little Help needed moving from lying on your back to sitting on the side  of a flat bed without using bedrails?: A Little Help needed moving to and from a bed to a chair (including a wheelchair)?: A Little Help needed standing up from a chair using your arms (e.g., wheelchair or bedside chair)?: A Little Help needed to walk in hospital room?: A Lot Help needed climbing 3-5 steps with a railing? : A Lot 6 Click Score: 16    End of Session Equipment Utilized During Treatment: Gait belt;Oxygen Activity Tolerance: Patient limited by fatigue Patient left: in chair;with call bell/phone within reach Nurse Communication: Mobility status PT Visit Diagnosis: Muscle weakness (generalized) (M62.81);Difficulty in walking, not elsewhere classified (R26.2);Pain Pain - part of body: (back)    Time: 1420-1450 PT Time Calculation (min) (ACUTE ONLY): 30 min   Charges:   PT Evaluation $PT Eval Moderate Complexity: 1 Mod PT Treatments $Therapeutic Activity: 8-22 mins        Leighton Roach, PT  Acute Rehab Services  Pager [phone] Office [place] 04/13/2019, 3:03 PM

## 2019-04-13 NOTE — Progress Notes (Signed)
CCMD called staff and informed that patient had 6 beats of Atrial Tachy. Patient is asymptomatic, no complaints, no distress noted. MD made aware. Will continue to monitor.

## 2019-04-14 ENCOUNTER — Inpatient Hospital Stay (HOSPITAL_COMMUNITY): Payer: Medicare Other

## 2019-04-14 LAB — CBC WITH DIFFERENTIAL/PLATELET
Abs Immature Granulocytes: 1.33 10*3/uL — ABNORMAL HIGH (ref 0.00–0.07)
Basophils Absolute: 0.1 10*3/uL (ref 0.0–0.1)
Basophils Relative: 1 %
Eosinophils Absolute: 0 10*3/uL (ref 0.0–0.5)
Eosinophils Relative: 0 %
HCT: 47.1 % (ref 39.0–52.0)
Hemoglobin: 16 g/dL (ref 13.0–17.0)
Immature Granulocytes: 6 %
Lymphocytes Relative: 7 %
Lymphs Abs: 1.4 10*3/uL (ref 0.7–4.0)
MCH: 28.2 pg (ref 26.0–34.0)
MCHC: 34 g/dL (ref 30.0–36.0)
MCV: 83.1 fL (ref 80.0–100.0)
Monocytes Absolute: 1.4 10*3/uL — ABNORMAL HIGH (ref 0.1–1.0)
Monocytes Relative: 7 %
Neutro Abs: 16.7 10*3/uL — ABNORMAL HIGH (ref 1.7–7.7)
Neutrophils Relative %: 79 %
Platelets: 233 10*3/uL (ref 150–400)
RBC: 5.67 MIL/uL (ref 4.22–5.81)
RDW: 16.1 % — ABNORMAL HIGH (ref 11.5–15.5)
WBC: 20.9 10*3/uL — ABNORMAL HIGH (ref 4.0–10.5)
nRBC: 0 % (ref 0.0–0.2)

## 2019-04-14 LAB — COMPREHENSIVE METABOLIC PANEL
ALT: 23 U/L (ref 0–44)
AST: 21 U/L (ref 15–41)
Albumin: 2.2 g/dL — ABNORMAL LOW (ref 3.5–5.0)
Alkaline Phosphatase: 50 U/L (ref 38–126)
Anion gap: 11 (ref 5–15)
BUN: 17 mg/dL (ref 8–23)
CO2: 19 mmol/L — ABNORMAL LOW (ref 22–32)
Calcium: 7.4 mg/dL — ABNORMAL LOW (ref 8.9–10.3)
Chloride: 99 mmol/L (ref 98–111)
Creatinine, Ser: 1 mg/dL (ref 0.61–1.24)
GFR calc Af Amer: >60 mL/min (ref >60)
GFR calc non Af Amer: >60 mL/min (ref >60)
Glucose, Bld: 150 mg/dL — ABNORMAL HIGH (ref 70–99)
Potassium: 4.2 mmol/L (ref 3.5–5.1)
Sodium: 129 mmol/L — ABNORMAL LOW (ref 135–145)
Total Bilirubin: 1.4 mg/dL — ABNORMAL HIGH (ref 0.3–1.2)
Total Protein: 5.2 g/dL — ABNORMAL LOW (ref 6.5–8.1)

## 2019-04-14 LAB — URIC ACID: Uric Acid, Serum: 4.7 mg/dL (ref 3.7–8.6)

## 2019-04-14 LAB — C-REACTIVE PROTEIN: CRP: 19.9 mg/dL — ABNORMAL HIGH (ref <1.0)

## 2019-04-14 LAB — MAGNESIUM: Magnesium: 1.9 mg/dL (ref 1.7–2.4)

## 2019-04-14 LAB — OSMOLALITY, URINE: Osmolality, Ur: 631 mOsm/kg (ref 300–900)

## 2019-04-14 LAB — PHOSPHORUS: Phosphorus: 2.4 mg/dL — ABNORMAL LOW (ref 2.5–4.6)

## 2019-04-14 LAB — D-DIMER, QUANTITATIVE: D-Dimer, Quant: 2.15 ug/mL-FEU — ABNORMAL HIGH (ref 0.00–0.50)

## 2019-04-14 LAB — OSMOLALITY: Osmolality: 276 mOsm/kg (ref 275–295)

## 2019-04-14 LAB — SODIUM, URINE, RANDOM: Sodium, Ur: 15 mmol/L

## 2019-04-14 LAB — CREATININE, URINE, RANDOM: Creatinine, Urine: 106.78 mg/dL

## 2019-04-14 LAB — BRAIN NATRIURETIC PEPTIDE: B Natriuretic Peptide: 231.1 pg/mL — ABNORMAL HIGH (ref 0.0–100.0)

## 2019-04-14 LAB — PROCALCITONIN: Procalcitonin: 0.19 ng/mL

## 2019-04-14 NOTE — Progress Notes (Signed)
Patient out of bed in the chair; encouraged to use IS and flutter valve. When in bed, LLE elevated. Patient's wife Edward Mejia was called with updates. Will continue to monitor.

## 2019-04-14 NOTE — Care Management (Signed)
Spoke w patient's wife about [facility] services. She stats that she declined it after the [facility] discharge because she thought it was not covered by insurance. I explained to her that it is and it is really helpful at times to prevent readmission to the hospital. She is agreeable to [facility] and I asked the liaison to reach out to her to explain cost and copay if any.

## 2019-04-14 NOTE — Progress Notes (Signed)
[facility]   Subjective  -states his left leg feels much better on anticoagulation. Not having any significant leg pain at this time. Left leg is in compression.   Objective 123/70 73 98.3 F (36.8 C) (Oral) 18 94%  Intake/Output Summary (Last 24 hours) at 04/14/2019 1043 Last data filed at 04/14/2019 0854 Gross per 24 hour  Intake 1027.19 ml  Output 1050 ml  Net -22.81 ml    Left leg swelling significantly improved, no evidence of phlegmasia  Laboratory Lab Results: Recent Labs    04/13/19 0418 04/14/19 0209  WBC 23.9* 20.9*  HGB 17.4* 16.0  HCT 53.3* 47.1  PLT 247 233   BMET Recent Labs    04/13/19 0418 04/14/19 0209  NA 133* 129*  K 4.1 4.2  CL 100 99  CO2 17* 19*  GLUCOSE 90 150*  BUN 17 17  CREATININE 1.13 1.00  CALCIUM 7.6* 7.4*    COAG Lab Results  Component Value Date   INR 1.3 (H) 04/12/2019   INR 1.4 (H) 04/03/2019   No results found for: PTT  Assessment/Planning:  74 year old male with recent diagnosis of Covid that presented to the ED on Friday night with extensive left lower extremity DVT. He has been in the Covid unit through the weekend and has gotten anticoagulation and compression and has had significant improvement in his symptoms and has no pain in the left leg at this time. In discussing with Dr.Singh we agreed that likely will not tolerate any type of prone positioning in the Cath Lab for intervention and any intervention in the OR requiring general anestehsia would be high risk particularly given extensive PE's. Given that his symptoms have resolved we can elect for conservative management and will have him follow-up with me in 2 to 3 weeks in clinic to see how he is progressing and can always reevaluate percutaneous mechanical thrombectomy at that time if his symptoms progress.  Vascular will sign off.  Call with questions or concerns.  Marty Heck 04/14/2019 10:43 AM --

## 2019-04-14 NOTE — Progress Notes (Signed)
PROGRESS NOTE                                                                                                                                                                                                             Patient Demographics:    Edward Mejia, is a 74 y.o. male, DOB - 1945/06/01, [id number]  Outpatient Primary MD for the patient is Caryn Section Kirstie Peri, MD    LOS - 2  Admit date - 04/12/2019    Chief Complaint  Patient presents with  . Weakness       Brief Narrative  Edward Mejia is a 74 y.o. male with medical history significant of hypertension, hyperlipidemia, recently diagnosed with Covid pneumonia 2 weeks ago and was hospitalized from 04/03/2019 to 04/07/2019 at [facility] presented to emergency department due to left lower extremity swelling, shortness of breath and generalized weakness, work-up was consistent with bilateral large PEs, with large left lower extremity DVT, blood clots in IVC and left external iliac vein.  He was admitted for further care.  He was seen by PCCM and vascular surgery in the ER.   Subjective:   Patient in bed, appears comfortable, denies any headache, no fever, no chest pain or pressure, no shortness of breath , no abdominal pain. No focal weakness.   Assessment  & Plan :      1. Acute Hypoxic Resp. Failure due to Acute bilateral PE - had recently been adequately treated for COVID-19 infection, likely hypercoagulable due to that, currently on therapeutic dose Lovenox, hemodynamically stable, continue oxygen supplementation and supportive care and monitor closely.  Case discussed with vascular surgeon Dr. Carlis Abbott on 04/13/2019.  Overall clinically he looks much better, leg is less swollen and he is not having any shortness of breath at rest.  Continue present line of care.  Encouraged the patient to sit up in chair in the daytime use I-S and flutter valve for pulmonary toiletry and then  prone in bed when at night.  SpO2: 94 % O2 Flow Rate (L/min): 1.5 L/min  Recent Labs  Lab 04/12/19 1045 04/12/19 2023 04/13/19 0418 04/13/19 0805 04/14/19 0209  CRP  --  13.7* 16.6*  --  19.9*  DDIMER  --  2.95* 2.89*  --  2.15*  FERRITIN  --  496* 626*  --   --  BNP 676.3*  --   --   --  231.1*  PROCALCITON  --  0.13  --  0.24 0.19    2.  Recent COVID-19 infection.  Adequately treated.  Supportive care only.  Was diagnosed on 02/17/2019.  No evidence of acute bacterial infection.  Stop all antibiotics.  3.  Left leg DVT, clot in IVC/left external iliac vein, large bilateral PE.  Full dose Lovenox.  Keep left leg elevated, TED stockings.  Case discussed with vascular surgery as above.  4.  GERD.  PPI and Misoprostol.  5.  History of psoriatic arthritis.  Resume home medications once acute issues have is resolved.  6. Hyponatremia - stop IVF, could be SIADH, monitor Osm and Na.     Condition - Fair  Family Communication  :  Wife 3/6, 3/7  Code Status :  DNR  Diet :   Diet Order            Diet Heart Room service appropriate? Yes; Fluid consistency: Thin  Diet effective now               Disposition Plan  : Stay in PCU getting treatment for massive PE, DVT involving L.Leg and IVC and left external iliac vein.  Consults  :  VVS  Procedures  :    CTA - Bilat PE  Leg [place]  - RIGHT: - No evidence of common femoral vein obstruction.  LEFT: - Findings consistent with acute deep vein thrombosis involving the left common femoral vein, SF junction, left femoral vein, left proximal profunda vein, left popliteal vein, left posterior tibial veins, left peroneal veins, left gastrocnemius veins, and EIV. - No cystic structure found in the popliteal fossa.  *See table(s) above for measurements and observations  US IVC/Iliac: There is evidence of acute thrombus involving the IVC. There is evidence of acute thrombus involving the left external iliac vein.    PUD Prophylaxis :  PPI, Misoprostol   DVT Prophylaxis  :  Lovenox    Lab Results  Component Value Date   PLT 233 04/14/2019    Inpatient Medications  Scheduled Meds: . vitamin C  500 mg Oral Daily  . enoxaparin (LOVENOX) injection  95 mg Subcutaneous Q12H  . gabapentin  600 mg Oral Q1400  . HYDROcodone-acetaminophen  1 tablet Oral Q6H  . hydrocortisone  25 mg Rectal BID  . misoprostol  100 mcg Oral Q lunch  . pantoprazole  40 mg Oral BID AC  . ranolazine  500 mg Oral BID  . zinc sulfate  220 mg Oral Daily   Continuous Infusions:  PRN Meds:.acetaminophen **OR** [DISCONTINUED] acetaminophen, albuterol, chlorpheniramine-HYDROcodone, guaiFENesin-dextromethorphan, nitroGLYCERIN, [DISCONTINUED] ondansetron **OR** ondansetron (ZOFRAN) IV, oxyCODONE-acetaminophen, senna-docusate  Antibiotics  :    Anti-infectives (From admission, onward)   Start     Dose/Rate Route Frequency Ordered Stop   04/13/19 2000  vancomycin (VANCOCIN) IVPB 1000 mg/200 mL premix  Status:  Discontinued     1,000 mg 200 mL/hr over 60 Minutes Intravenous Every 12 hours 04/13/19 0847 04/13/19 1017   04/13/19 0800  vancomycin (VANCOREADY) IVPB 1250 mg/250 mL  Status:  Discontinued     1,250 mg 166.7 mL/hr over 90 Minutes Intravenous Every 12 hours 04/12/19 1805 04/13/19 0847   04/13/19 0100  piperacillin-tazobactam (ZOSYN) IVPB 3.375 g  Status:  Discontinued     3.375 g 12.5 mL/hr over 240 Minutes Intravenous Every 8 hours 04/12/19 1805 04/13/19 1017   04/12/19 1815  vancomycin (VANCOREADY) IVPB 2000 mg/400  mL     2,000 mg 200 mL/hr over 120 Minutes Intravenous  Once 04/12/19 1801 04/13/19 0102   04/12/19 1815  piperacillin-tazobactam (ZOSYN) IVPB 3.375 g     3.375 g 100 mL/hr over 30 Minutes Intravenous  Once 04/12/19 1801 04/12/19 2314       Time Spent in minutes  30   Lala Lund M.D on 04/14/2019 at 9:47 AM  To page go to [email or web address] - password [facility]  [facility] -  Office  [phone]   See all Orders  from today for further details    Objective:   Vitals:   04/14/19 0000 04/14/19 0400 04/14/19 0515 04/14/19 0746  BP: (!) 143/89 121/74 123/70   Pulse: 79 70 69 73  Resp: 20 15 14 18   Temp:   98.1 F (36.7 C) 98.3 F (36.8 C)  TempSrc:   Oral Oral  SpO2: 95% 90% 91% 94%  Weight:   97.5 kg   Height:        Wt Readings from Last 3 Encounters:  04/14/19 97.5 kg  04/03/19 93.3 kg  04/02/19 99.8 kg     Intake/Output Summary (Last 24 hours) at 04/14/2019 0947 Last data filed at 04/14/2019 0854 Gross per 24 hour  Intake 1582.19 ml  Output 1050 ml  Net 532.19 ml     Physical Exam  Awake Alert, No new F.N deficits, Normal affect [place].AT,PERRAL Supple Neck,No JVD, No cervical lymphadenopathy appriciated.  Symmetrical Chest wall movement, Good air movement bilaterally, CTAB RRR,No Gallops, Rubs or new Murmurs, No Parasternal Heave +ve B.Sounds, Abd Soft, No tenderness, No organomegaly appriciated, No rebound - guarding or rigidity. No Cyanosis,  L. Leg is swollen    Data Review:    CBC Recent Labs  Lab 04/07/19 1100 04/12/19 1045 04/13/19 0418 04/14/19 0209  WBC  --  20.1* 23.9* 20.9*  HGB 16.9 18.7* 17.4* 16.0  HCT 49.7 56.7* 53.3* 47.1  PLT  --  228 247 233  MCV  --  85.8 85.4 83.1  MCH  --  28.3 27.9 28.2  MCHC  --  33.0 32.6 34.0  RDW  --  17.5* 16.2* 16.1*  LYMPHSABS  --   --   --  1.4  MONOABS  --   --   --  1.4*  EOSABS  --   --   --  0.0  BASOSABS  --   --   --  0.1    Chemistries  Recent Labs  Lab 04/12/19 1045 04/12/19 2023 04/13/19 0418 04/14/19 0209  NA 131*  --  133* 129*  K 5.1  --  4.1 4.2  CL 101  --  100 99  CO2 16*  --  17* 19*  GLUCOSE 102*  --  90 150*  BUN 17  --  17 17  CREATININE 0.91  --  1.13 1.00  CALCIUM 8.0*  --  7.6* 7.4*  MG  --  1.8 1.8 1.9  AST 29  --  23 21  ALT 23  --  23 23  ALKPHOS 49  --  47 50  BILITOT 2.6*  --  2.4* 1.4*    ------------------------------------------------------------------------------------------------------------------ No results for input(s): CHOL, HDL, LDLCALC, TRIG, CHOLHDL, LDLDIRECT in the last 72 hours.  Lab Results  Component Value Date   HGBA1C 5.8 (H) 04/04/2019   ------------------------------------------------------------------------------------------------------------------ No results for input(s): TSH, T4TOTAL, T3FREE, THYROIDAB in the last 72 hours.  Invalid input(s): FREET3  Cardiac Enzymes No results for input(s): CKMB, TROPONINI,  MYOGLOBIN in the last 168 hours.  Invalid input(s): CK ------------------------------------------------------------------------------------------------------------------    Component Value Date/Time   BNP 231.1 (H) 04/14/2019 0209    Micro Results No results found for this or any previous visit (from the past 240 hour(s)).  Radiology Reports DG Chest 2 View  Result Date: 04/02/2019 CLINICAL DATA:  74 year old male with positive COVID-19 with worsening fatigue and weakness. EXAM: CHEST - 2 VIEW COMPARISON:  Chest radiograph dated 10/20/2017. FINDINGS: Bilateral diffuse hazy and streaky densities most consistent with multifocal pneumonia and in keeping with COVID-19. No lobar consolidation, pleural effusion, pneumothorax. Mild cardiomegaly. Atherosclerotic calcification of the aorta. No acute osseous pathology. Upper abdominal surgical clips. IMPRESSION: Multifocal pneumonia. Clinical correlation and follow-up to resolution recommended. Electronically Signed   By: Anner Crete M.D.   On: 04/02/2019 18:37   CT Angio Chest PE W/Cm &/Or Wo Cm  Result Date: 04/12/2019 CLINICAL DATA:  Progressive shortness of breath. COVID-19. EXAM: CT ANGIOGRAPHY CHEST WITH CONTRAST TECHNIQUE: Multidetector CT imaging of the chest was performed using the standard protocol during bolus administration of intravenous contrast. Multiplanar CT image reconstructions  and MIPs were obtained to evaluate the vascular anatomy. CONTRAST:  172mL OMNIPAQUE IOHEXOL 350 MG/ML SOLN COMPARISON:  Chest x-ray dated 04/12/2019 and 04/03/2019 and chest CT dated 04/03/2019 FINDINGS: Cardiovascular: The patient has developed numerous bilateral pulmonary emboli to all lobes of both lungs, most extensive in the right lung. Elevated RV LV ratio of 1.1. Aortic atherosclerosis. Coronary artery calcifications. No pericardial effusion. Mediastinum/Nodes: No enlarged mediastinal, hilar, or axillary lymph nodes. Thyroid gland, trachea, and esophagus demonstrate no significant findings. Lungs/Pleura: Extensive primarily peripheral pulmonary infiltrates are minimally changed since the prior study. No effusions. Upper Abdomen: No acute abnormality. Musculoskeletal: No chest wall abnormality. No acute or significant osseous findings. Review of the MIP images confirms the above findings. IMPRESSION: Extensive bilateral pulmonary emboli with elevated RV LV ratio of 1.1. No significant change in the extensive peripheral bilateral pulmonary infiltrates. Aortic Atherosclerosis (ICD10-I70.0). Electronically Signed   By: Lorriane Shire M.D.   On: 04/12/2019 15:53   CT ANGIO CHEST PE W OR WO CONTRAST  Result Date: 04/03/2019 CLINICAL DATA:  Hypoxemia. COVID-19 infection. EXAM: CT ANGIOGRAPHY CHEST WITH CONTRAST TECHNIQUE: Multidetector CT imaging of the chest was performed using the standard protocol during bolus administration of intravenous contrast. Multiplanar CT image reconstructions and MIPs were obtained to evaluate the vascular anatomy. CONTRAST:  26mL OMNIPAQUE IOHEXOL 350 MG/ML SOLN COMPARISON:  CT 01/01/2018. Radiographs 04/03/2019 and 04/02/2019. FINDINGS: Cardiovascular: The pulmonary arteries are well opacified with contrast to the level of the subsegmental branches. Subsegmental branch valuation is mildly limited by breathing artifact. No evidence of acute pulmonary embolism. There is  atherosclerosis of the aorta, great vessels and coronary arteries. The heart size is normal. There is no pericardial effusion. Mediastinum/Nodes: There are no enlarged mediastinal, hilar or axillary lymph nodes. The thyroid gland, trachea and esophagus demonstrate no significant findings. Lungs/Pleura: No pleural effusion or pneumothorax. Multifocal ground-glass and airspace opacities are present peripherally in both lungs consistent with viral pneumonia. In addition, there are mild dependent opacities in both lower lobes which are likely due to atelectasis. No endobronchial lesion identified. Upper abdomen: No acute findings are seen within the visualized upper abdomen. There are postsurgical changes within the stomach consistent with gastric bypass. Possible mild dilatation of the ventral pancreatic duct in the pancreatic head (image 155/4), similar to previous study. Musculoskeletal/Chest wall: There is no chest wall mass or suspicious osseous finding. Review  of the MIP images confirms the above findings. IMPRESSION: 1. No evidence of acute pulmonary embolism. 2. Multifocal ground-glass and airspace opacities peripherally in both lungs consistent with viral pneumonia. 3. Coronary and aortic Atherosclerosis (ICD10-I70.0). Electronically Signed   By: Richardean Sale M.D.   On: 04/03/2019 17:36   VAS [place] IVC/ILIAC (VENOUS ONLY)  Result Date: 04/12/2019 IVC/ILIAC STUDY Indications: DVT left leg Limitations: Air/bowel gas.  Performing Technologist: Baldwin Crown ARDMS, RVT  Examination Guidelines: A complete evaluation includes B-mode imaging, spectral Doppler, color Doppler, and power Doppler as needed of all accessible portions of each vessel. Bilateral testing is considered an integral part of a complete examination. Limited examinations for reoccurring indications may be performed as noted.  IVC/Iliac Findings: +----------+------+--------+--------+    IVC    PatentThrombusComments  +----------+------+--------+--------+ IVC Distalpatent acute           +----------+------+--------+--------+  +---------------+---------+-----------+---------+-----------+-----------------+       CIV      RT-PatentRT-ThrombusLT-PatentLT-Thrombus    Comments      +---------------+---------+-----------+---------+-----------+-----------------+ Common Iliac                                            Not visualized   Mid                                                    due to overlying                                                             bowel/gas     +---------------+---------+-----------+---------+-----------+-----------------+  +-------------------------+---------+-----------+---------+-----------+--------+            EIV           RT-PatentRT-ThrombusLT-PatentLT-ThrombusComments +-------------------------+---------+-----------+---------+-----------+--------+ External Iliac Vein Mid                                  acute            +-------------------------+---------+-----------+---------+-----------+--------+ External Iliac Vein                                      acute            Distal                                                                    +-------------------------+---------+-----------+---------+-----------+--------+   Summary: IVC/Iliac: There is evidence of acute thrombus involving the IVC. There is evidence of acute thrombus involving the left external iliac vein.  *See table(s) above for measurements and observations.  Electronically signed by Monica Martinez MD on 04/12/2019 at 4:39:37 PM.   Final    DG Chest [facility]  Result Date: 04/14/2019 CLINICAL DATA:  Follow-up COVID-19 pneumonia. Current history of pulmonary emboli. EXAM: PORTABLE CHEST 1 VIEW COMPARISON:  04/12/2019 and earlier, including CTA chest 04/12/2019. FINDINGS: Cardiac silhouette normal in size for AP portable technique, unchanged. Peripheral airspace  opacities throughout both lungs, progressive since the examination 2 days ago. No visible pleural effusions. Pulmonary vascularity normal. IMPRESSION: Progressive peripheral airspace opacities throughout both lungs since the examination 2 days ago, indicating worsening pneumonia. Electronically Signed   By: Evangeline Dakin M.D.   On: 04/14/2019 08:03   DG Chest Port 1 View  Result Date: 04/12/2019 CLINICAL DATA:  Weakness, shortness of breath EXAM: PORTABLE CHEST 1 VIEW COMPARISON:  04/03/2019 FINDINGS: Patchy airspace disease within the lungs bilaterally concerning for pneumonia. This has increased since prior study, particularly in the left lung. Heart is normal size. No effusions or acute bony abnormality. IMPRESSION: Progressive patchy bilateral airspace disease, particularly in the left lung concerning for pneumonia. Electronically Signed   By: Rolm Baptise M.D.   On: 04/12/2019 10:43   DG Chest Port 1 View  Result Date: 04/03/2019 CLINICAL DATA:  Shortness of breath in a patient who is COVID-19 positive. EXAM: PORTABLE CHEST 1 VIEW COMPARISON:  PA and lateral chest 04/02/2019 and 05/01/2017. FINDINGS: Patchy bilateral airspace disease seen on the most recent examination persists without notable change. Heart size is normal. No pneumothorax or pleural fluid. No acute or focal bony abnormality. IMPRESSION: No change of patchy bilateral airspace disease consistent with pneumonia. Electronically Signed   By: Inge Rise M.D.   On: 04/03/2019 10:14   ECHOCARDIOGRAM COMPLETE  Result Date: 04/13/2019    ECHOCARDIOGRAM REPORT   Patient Name:   Edward Mejia Date of Exam: 04/13/2019 Medical Rec #:  [id number]         Height:       72.0 in Accession #:    [id number]        Weight:       212.5 lb Date of Birth:  [birth year]        BSA:          2.186 m Patient Age:    50 years          BP:           123/77 mmHg Patient Gender: M                 HR:           91 bpm. Exam Location:  Inpatient Procedure:  2D Echo Indications:    Pulmonary Embolus 415.19 / I26.99  History:        Patient has no prior history of Echocardiogram examinations.                 Risk Factors:Hypertension. COVID-19 virus infection                 Sepsis.  Sonographer:    Vikki Ports Turrentine Referring Phys: [id number] Mckinley Jewel  Sonographer Comments: Technically difficult to accurately assess IVC diameter and collapse due to patient respiratory motion and body habitus. IMPRESSIONS  1. Normal LV systolic function; grade 1 diastolic dysfunction; probable mobile plaque in aortic arch.  2. Left ventricular ejection fraction, by estimation, is 60 to 65%. The left ventricle has normal function. The left ventricle has no regional wall motion abnormalities. Left ventricular diastolic parameters are consistent with Grade I diastolic dysfunction (impaired relaxation).  3. Right ventricular systolic function is normal. The right ventricular size is  normal.  4. The mitral valve is normal in structure and function. Trivial mitral valve regurgitation. No evidence of mitral stenosis.  5. The aortic valve is tricuspid. Aortic valve regurgitation is not visualized. No aortic stenosis is present.  6. The inferior vena cava is normal in size with greater than 50% respiratory variability, suggesting right atrial pressure of 3 mmHg. FINDINGS  Left Ventricle: Left ventricular ejection fraction, by estimation, is 60 to 65%. The left ventricle has normal function. The left ventricle has no regional wall motion abnormalities. The left ventricular internal cavity size was normal in size. There is  no left ventricular hypertrophy. Left ventricular diastolic parameters are consistent with Grade I diastolic dysfunction (impaired relaxation). Right Ventricle: The right ventricular size is normal. Right ventricular systolic function is normal. Left Atrium: Left atrial size was normal in size. Right Atrium: Right atrial size was normal in size. Pericardium: Trivial  pericardial effusion is present. Mitral Valve: The mitral valve is normal in structure and function. Normal mobility of the mitral valve leaflets. Trivial mitral valve regurgitation. No evidence of mitral valve stenosis. Tricuspid Valve: The tricuspid valve is normal in structure. Tricuspid valve regurgitation is mild . No evidence of tricuspid stenosis. Aortic Valve: The aortic valve is tricuspid. Aortic valve regurgitation is not visualized. No aortic stenosis is present. Pulmonic Valve: The pulmonic valve was normal in structure. Pulmonic valve regurgitation is trivial. No evidence of pulmonic stenosis. Aorta: The aortic root is normal in size and structure. Venous: The inferior vena cava is normal in size with greater than 50% respiratory variability, suggesting right atrial pressure of 3 mmHg. IAS/Shunts: No atrial level shunt detected by color flow Doppler. Additional Comments: Normal LV systolic function; grade 1 diastolic dysfunction; probable mobile plaque in aortic arch.  LEFT VENTRICLE PLAX 2D LVIDd:         4.27 cm  Diastology LVIDs:         2.70 cm  LV e' lateral:   6.96 cm/s LV PW:         1.11 cm  LV E/e' lateral: 8.5 LV IVS:        1.14 cm  LV e' medial:    5.55 cm/s LVOT diam:     2.30 cm  LV E/e' medial:  10.7 LV SV:         72 LV SV Index:   33 LVOT Area:     4.15 cm  RIGHT ATRIUM           Index RA Area:     17.80 cm RA Volume:   48.00 ml  21.96 ml/m  AORTIC VALVE LVOT Vmax:   97.90 cm/s LVOT Vmean:  66.800 cm/s LVOT VTI:    0.173 m  AORTA Ao Root diam: 3.60 cm MITRAL VALVE                TRICUSPID VALVE MV Area (PHT): 3.27 cm     TR Peak grad:   23.0 mmHg MV Decel Time: 232 msec     TR Vmax:        240.00 cm/s MV E velocity: 59.40 cm/s MV A velocity: 107.00 cm/s  SHUNTS MV E/A ratio:  0.56         Systemic VTI:  0.17 m                             Systemic Diam: 2.30 cm Kirk Ruths MD Electronically signed by Kirk Ruths  MD Signature Date/Time: 04/13/2019/1:14:52 PM    Final    VAS [place]  LOWER EXTREMITY VENOUS (DVT) (MC and WL 7a-7p)  Result Date: 04/12/2019  Lower Venous DVTStudy Indications: Pain, and Swelling.  Comparison Study: LEV 04-04-19, negative. Performing Technologist: Baldwin Crown ARDMS, RVT  Examination Guidelines: A complete evaluation includes B-mode imaging, spectral Doppler, color Doppler, and power Doppler as needed of all accessible portions of each vessel. Bilateral testing is considered an integral part of a complete examination. Limited examinations for reoccurring indications may be performed as noted. The reflux portion of the exam is performed with the patient in reverse Trendelenburg.  +-----+---------------+---------+-----------+----------+--------------+ RIGHTCompressibilityPhasicitySpontaneityPropertiesThrombus Aging +-----+---------------+---------+-----------+----------+--------------+ CFV  Full           Yes      Yes                                 +-----+---------------+---------+-----------+----------+--------------+   +---------+---------------+---------+-----------+----------+--------------+ LEFT     CompressibilityPhasicitySpontaneityPropertiesThrombus Aging +---------+---------------+---------+-----------+----------+--------------+ CFV      None           No       No                   Acute          +---------+---------------+---------+-----------+----------+--------------+ SFJ      None                                                        +---------+---------------+---------+-----------+----------+--------------+ FV Prox  None                                                        +---------+---------------+---------+-----------+----------+--------------+ FV Mid   None                                                        +---------+---------------+---------+-----------+----------+--------------+ FV DistalNone                                                         +---------+---------------+---------+-----------+----------+--------------+ PFV      None                                                        +---------+---------------+---------+-----------+----------+--------------+ POP      None           No       No                                  +---------+---------------+---------+-----------+----------+--------------+ PTV  None                                                        +---------+---------------+---------+-----------+----------+--------------+ PERO     None                                                        +---------+---------------+---------+-----------+----------+--------------+ Gastroc  None                                                        +---------+---------------+---------+-----------+----------+--------------+ EIV      None                                                        +---------+---------------+---------+-----------+----------+--------------+     Summary: RIGHT: - No evidence of common femoral vein obstruction.  LEFT: - Findings consistent with acute deep vein thrombosis involving the left common femoral vein, SF junction, left femoral vein, left proximal profunda vein, left popliteal vein, left posterior tibial veins, left peroneal veins, left gastrocnemius veins, and EIV. - No cystic structure found in the popliteal fossa.  *See table(s) above for measurements and observations. Electronically signed by Monica Martinez MD on 04/12/2019 at 4:39:23 PM.    Final    VAS [place] LOWER EXTREMITY VENOUS (DVT)  Result Date: 04/04/2019  Lower Venous DVTStudy Indications: Elevated ddimer.  Comparison Study: no prior Performing Technologist: Abram Sander RVS  Examination Guidelines: A complete evaluation includes B-mode imaging, spectral Doppler, color Doppler, and power Doppler as needed of all accessible portions of each vessel. Bilateral testing is considered an integral part of a complete  examination. Limited examinations for reoccurring indications may be performed as noted. The reflux portion of the exam is performed with the patient in reverse Trendelenburg.  +---------+---------------+---------+-----------+----------+--------------+ RIGHT    CompressibilityPhasicitySpontaneityPropertiesThrombus Aging +---------+---------------+---------+-----------+----------+--------------+ CFV      Full           Yes      Yes                                 +---------+---------------+---------+-----------+----------+--------------+ SFJ      Full                                                        +---------+---------------+---------+-----------+----------+--------------+ FV Prox  Full                                                        +---------+---------------+---------+-----------+----------+--------------+  FV Mid   Full                                                        +---------+---------------+---------+-----------+----------+--------------+ FV DistalFull                                                        +---------+---------------+---------+-----------+----------+--------------+ PFV      Full                                                        +---------+---------------+---------+-----------+----------+--------------+ POP      Full           Yes      Yes                                 +---------+---------------+---------+-----------+----------+--------------+ PTV      Full                                                        +---------+---------------+---------+-----------+----------+--------------+ PERO     Full                                                        +---------+---------------+---------+-----------+----------+--------------+   +---------+---------------+---------+-----------+----------+--------------+ LEFT     CompressibilityPhasicitySpontaneityPropertiesThrombus Aging  +---------+---------------+---------+-----------+----------+--------------+ CFV      Full           Yes      Yes                                 +---------+---------------+---------+-----------+----------+--------------+ SFJ      Full                                                        +---------+---------------+---------+-----------+----------+--------------+ FV Prox  Full                                                        +---------+---------------+---------+-----------+----------+--------------+ FV Mid   Full                                                        +---------+---------------+---------+-----------+----------+--------------+   FV DistalFull                                                        +---------+---------------+---------+-----------+----------+--------------+ PFV      Full                                                        +---------+---------------+---------+-----------+----------+--------------+ POP      Full           Yes      Yes                                 +---------+---------------+---------+-----------+----------+--------------+ PTV      Full                                                        +---------+---------------+---------+-----------+----------+--------------+ PERO     Full                                                        +---------+---------------+---------+-----------+----------+--------------+     Summary: BILATERAL: - No evidence of deep vein thrombosis seen in the lower extremities, bilaterally.   *See table(s) above for measurements and observations. Electronically signed by Monica Martinez MD on 04/04/2019 at 7:02:03 PM.    Final

## 2019-04-14 NOTE — Evaluation (Signed)
Occupational Therapy Evaluation Patient Details Name: Edward Mejia MRN: [id number] DOB: 08/01/1945 Today's Date: 04/14/2019    History of Present Illness  Edward Mejia is a 74 y.o. male with medical history significant of hypertension, hyperlipidemia, recently diagnosed with Covid pneumonia 2 weeks ago and was hospitalized from 04/03/2019 to 04/07/2019 at [facility] presented to emergency department due to left lower extremity swelling, shortness of breath and generalized weakness, work-up was consistent with bilateral large PEs, with large left lower extremity DVT, blood clots in IVC and left external iliac vein.    Clinical Impression   PTA independent in all ADL, IADL, and mobility tasks (prior to covid diagnosis and hospitalization, short stint at home where pt stated "i just got worse and couldn't do anything") Pt currently set up for self-care in sitting due to decreased activity tolerance and SOB after short ambulation to bathroom and back with RW at min A. SpO2 dropped to 88% with mobility and Pt DOE 2/4 - required sitting for grooming tasks. Pt mod A for LB ADL due to back pain and pain in LLE. Pt also with confusion during session vs HOH during session, STM deficits noted, as well as trouble following directions, sequencing tasks, and problem solving. OT will continue to follow acutely and recommend HHOT follow up to maximize safety and independence in ADL and functional transfers and to prevent re-admission (HHOT could do education with wife as well). Next session to focus on cognitive deficits (pill box test?) and continued OOB/activity tolerance.     Follow Up Recommendations  Home health OT;Supervision/Assistance - 24 hour(initially)    Equipment Recommendations  None recommended by OT(Pt has appropriate DME)    Recommendations for Other Services       Precautions / Restrictions Precautions Precautions: Fall Precaution Comments: cognition (is HOH a  factor?) Restrictions Weight Bearing Restrictions: No      Mobility Bed Mobility               General bed mobility comments: OOB at beginning and end of session  Transfers Overall transfer level: Needs assistance Equipment used: Rolling walker (2 wheeled) Transfers: Sit to/from Stand Sit to Stand: Min assist         General transfer comment: min A for line management and safety, Pt required x3 attempts to stand on his own - cues to push from arm rest to assist    Balance Overall balance assessment: Needs assistance Sitting-balance support: Feet supported Sitting balance-Leahy Scale: Good     Standing balance support: During functional activity;Bilateral upper extremity supported Standing balance-Leahy Scale: Poor Standing balance comment: reliant on UE support for balance and energy conservation                           ADL either performed or assessed with clinical judgement   ADL Overall ADL's : Needs assistance/impaired Eating/Feeding: Independent;Sitting   Grooming: Sitting;Minimal assistance;Wash/dry hands;Wash/dry face;Oral care Grooming Details (indicate cue type and reason): decreased activity tolerance, min cues for problem solving with opening containers and sequencing grooming tasks, Pt SOB with ambulation to and from bathrroom Upper Body Bathing: Min guard;Sitting   Lower Body Bathing: Minimal assistance;Sitting/lateral leans Lower Body Bathing Details (indicate cue type and reason): Pt has 3 in 1 as shower seat at home     Lower Body Dressing: Moderate assistance;Sit to/from stand   Toilet Transfer: Min guard;Ambulation;Regular Toilet;Grab bars;RW Armed forces technical officer Details (indicate cue type and reason): Pt with condom cath  currently Toileting- Water quality scientist and Hygiene: Min guard;Sit to/from stand Toileting - Clothing Manipulation Details (indicate cue type and reason): simulated     Functional mobility during ADLs: Min  guard;Rolling walker;Cueing for safety(Pt thought that the "fold-in buttons" were breaks) General ADL Comments: decreased activity tolerance and cognition impacting ability to perform ADL independently     Vision Baseline Vision/History: Wears glasses Wears Glasses: Reading only Patient Visual Report: No change from baseline Vision Assessment?: No apparent visual deficits     Perception     Praxis      Pertinent Vitals/Pain Pain Assessment: Faces Faces Pain Scale: Hurts a little bit Pain Location: low back (has been worse since covid) Pain Descriptors / Indicators: Aching;Sore Pain Intervention(s): Limited activity within patient's tolerance;Monitored during session;Repositioned     Hand Dominance Right   Extremity/Trunk Assessment Upper Extremity Assessment Upper Extremity Assessment: Generalized weakness   Lower Extremity Assessment Lower Extremity Assessment: Defer to PT evaluation   Cervical / Trunk Assessment Cervical / Trunk Assessment: Other exceptions Cervical / Trunk Exceptions: decreased lumbar lordosis   Communication Communication Communication: HOH   Cognition Arousal/Alertness: Awake/alert Behavior During Therapy: WFL for tasks assessed/performed Overall Cognitive Status: Impaired/Different from baseline Area of Impairment: Attention;Memory;Following commands;Safety/judgement;Problem solving                   Current Attention Level: Selective Memory: Decreased short-term memory Following Commands: Follows multi-step commands inconsistently Safety/Judgement: Decreased awareness of safety(line management)   Problem Solving: Difficulty sequencing;Requires verbal cues General Comments: Pt would answer questions incorrectly, trouble problem solving how to open toothbrush, when asked to sit in the chair he headed for the bed and needed cues. needs to continue to be assessed   General Comments  HR <100 throughout session, SpO2 did drop to 88% with  ambulation to and from bathroom, able to recover >90 with PLB - Pt SOB for approx 5 min afterwards    Exercises     Shoulder Instructions      Home Living Family/patient expects to be discharged to:: Private residence Living Arrangements: Spouse/significant other Available Help at Discharge: Family;Available 24 hours/day Type of Home: Mobile home Home Access: Stairs to enter Entrance Stairs-Number of Steps: 3   Home Layout: One level     Bathroom Shower/Tub: Teacher, early years/pre: Standard     Home Equipment: Cane - single point;Walker - 2 wheels;Bedside commode   Additional Comments: has been using RW since going home from [facility]. No AD before Covid      Prior Functioning/Environment Level of Independence: Independent        Comments: pt drives        OT Problem List: Decreased strength;Decreased activity tolerance;Impaired balance (sitting and/or standing);Decreased cognition;Decreased safety awareness;Cardiopulmonary status limiting activity      OT Treatment/Interventions: Self-care/ADL training;Therapeutic exercise;Neuromuscular education;Energy conservation;DME and/or AE instruction;Therapeutic activities;Patient/family education;Balance training    OT Goals(Current goals can be found in the care plan section) Acute Rehab OT Goals Patient Stated Goal: get better, decrease pain OT Goal Formulation: With patient Time For Goal Achievement: 04/28/19 Potential to Achieve Goals: Good ADL Goals Pt Will Perform Grooming: with supervision;standing Pt Will Perform Lower Body Dressing: with supervision;sit to/from stand Pt Will Transfer to Toilet: with supervision;ambulating Pt Will Perform Toileting - Clothing Manipulation and hygiene: with modified independence;sit to/from stand Pt Will Perform Tub/Shower Transfer: with supervision;3 in 1;rolling walker Pt/caregiver will Perform Home Exercise Program: Both right and left upper extremity;With  theraband;With written HEP provided Additional  ADL Goal #1: Pt will verbalize 3 energy conservation strategies to implement during ADL routine with no verbal cues  OT Frequency: Min 3X/week   Barriers to D/C:            Co-evaluation              AM-PAC OT "6 Clicks" Daily Activity     Outcome Measure Help from another person eating meals?: None Help from another person taking care of personal grooming?: A Little Help from another person toileting, which includes using toliet, bedpan, or urinal?: A Little Help from another person bathing (including washing, rinsing, drying)?: A Little Help from another person to put on and taking off regular upper body clothing?: A Little Help from another person to put on and taking off regular lower body clothing?: A Lot 6 Click Score: 18   End of Session Equipment Utilized During Treatment: Rolling walker Nurse Communication: Mobility status  Activity Tolerance: Patient tolerated treatment well Patient left: in chair;with call bell/phone within reach;with chair alarm set  OT Visit Diagnosis: Unsteadiness on feet (R26.81);Muscle weakness (generalized) (M62.81)                Time: [id number] OT Time Calculation (min): 30 min Charges:  OT General Charges $OT Visit: 1 Visit OT Evaluation $OT Eval Moderate Complexity: 1 Mod OT Treatments $Self Care/Home Management : 8-22 mins  Jesse Sans OTR/L Acute Rehabilitation Services Pager: [phone] Office: [place] 04/14/2019, 3:59 PM

## 2019-04-15 LAB — CBC WITH DIFFERENTIAL/PLATELET
Abs Immature Granulocytes: 1.31 10*3/uL — ABNORMAL HIGH (ref 0.00–0.07)
Basophils Absolute: 0.1 10*3/uL (ref 0.0–0.1)
Basophils Relative: 1 %
Eosinophils Absolute: 0 10*3/uL (ref 0.0–0.5)
Eosinophils Relative: 0 %
HCT: 48.9 % (ref 39.0–52.0)
Hemoglobin: 16.2 g/dL (ref 13.0–17.0)
Immature Granulocytes: 6 %
Lymphocytes Relative: 8 %
Lymphs Abs: 1.8 10*3/uL (ref 0.7–4.0)
MCH: 28.3 pg (ref 26.0–34.0)
MCHC: 33.1 g/dL (ref 30.0–36.0)
MCV: 85.5 fL (ref 80.0–100.0)
Monocytes Absolute: 1.8 10*3/uL — ABNORMAL HIGH (ref 0.1–1.0)
Monocytes Relative: 8 %
Neutro Abs: 16.9 10*3/uL — ABNORMAL HIGH (ref 1.7–7.7)
Neutrophils Relative %: 77 %
Platelets: 266 10*3/uL (ref 150–400)
RBC: 5.72 MIL/uL (ref 4.22–5.81)
RDW: 15.8 % — ABNORMAL HIGH (ref 11.5–15.5)
WBC: 21.9 10*3/uL — ABNORMAL HIGH (ref 4.0–10.5)
nRBC: 0 % (ref 0.0–0.2)

## 2019-04-15 LAB — C-REACTIVE PROTEIN: CRP: 7.8 mg/dL — ABNORMAL HIGH (ref <1.0)

## 2019-04-15 LAB — COMPREHENSIVE METABOLIC PANEL
ALT: 31 U/L (ref 0–44)
AST: 28 U/L (ref 15–41)
Albumin: 2.3 g/dL — ABNORMAL LOW (ref 3.5–5.0)
Alkaline Phosphatase: 55 U/L (ref 38–126)
Anion gap: 13 (ref 5–15)
BUN: 18 mg/dL (ref 8–23)
CO2: 23 mmol/L (ref 22–32)
Calcium: 7.5 mg/dL — ABNORMAL LOW (ref 8.9–10.3)
Chloride: 94 mmol/L — ABNORMAL LOW (ref 98–111)
Creatinine, Ser: 0.91 mg/dL (ref 0.61–1.24)
GFR calc Af Amer: >60 mL/min (ref >60)
GFR calc non Af Amer: >60 mL/min (ref >60)
Glucose, Bld: 113 mg/dL — ABNORMAL HIGH (ref 70–99)
Potassium: 4 mmol/L (ref 3.5–5.1)
Sodium: 130 mmol/L — ABNORMAL LOW (ref 135–145)
Total Bilirubin: 1.2 mg/dL (ref 0.3–1.2)
Total Protein: 5.5 g/dL — ABNORMAL LOW (ref 6.5–8.1)

## 2019-04-15 LAB — PROCALCITONIN: Procalcitonin: 0.21 ng/mL

## 2019-04-15 LAB — BRAIN NATRIURETIC PEPTIDE: B Natriuretic Peptide: 211.3 pg/mL — ABNORMAL HIGH (ref 0.0–100.0)

## 2019-04-15 LAB — PHOSPHORUS: Phosphorus: 1.6 mg/dL — ABNORMAL LOW (ref 2.5–4.6)

## 2019-04-15 LAB — D-DIMER, QUANTITATIVE: D-Dimer, Quant: 2.26 ug/mL-FEU — ABNORMAL HIGH (ref 0.00–0.50)

## 2019-04-15 LAB — MAGNESIUM: Magnesium: 2.1 mg/dL (ref 1.7–2.4)

## 2019-04-15 MED ORDER — APIXABAN 5 MG PO TABS: 5.0000 mg | ORAL_TABLET | Freq: Two times a day (BID) | ORAL | Status: DC

## 2019-04-15 MED ORDER — SODIUM PHOSPHATES 45 MMOLE/15ML IV SOLN
30.0000 mmol | Freq: Once | INTRAVENOUS | Status: AC
  Administered 2019-04-15: 30 mmol via INTRAVENOUS
  Filled 2019-04-15: qty 10

## 2019-04-15 MED ORDER — FUROSEMIDE 10 MG/ML IJ SOLN
40.0000 mg | Freq: Once | INTRAMUSCULAR | Status: AC
  Administered 2019-04-15: 40 mg via INTRAVENOUS
  Filled 2019-04-15: qty 4

## 2019-04-15 MED ORDER — APIXABAN 5 MG PO TABS
10.0000 mg | ORAL_TABLET | Freq: Two times a day (BID) | ORAL | Status: DC
  Administered 2019-04-16: 10 mg via ORAL
  Filled 2019-04-15: qty 2

## 2019-04-15 NOTE — Discharge Instructions (Addendum)
Follow with Primary MD Birdie Sons, MD in 7 days   Get CBC, CMP, 2 view Chest X ray -  checked next visit within 1 week by Primary MD    Activity: As tolerated with Full fall precautions use walker/cane & assistance as needed.  Keep TED stockings on during the daytime.  You can remove this at night.  Keep your left leg elevated with 3 pillows at night.  Disposition Home    Diet: Heart Healthy   Special Instructions: If you have smoked or chewed Tobacco  in the last 2 yrs please stop smoking, stop any regular Alcohol  and or any Recreational drug use.  On your next visit with your primary care physician please Get Medicines reviewed and adjusted.  Please request your Prim.MD to go over all Hospital Tests and Procedure/Radiological results at the follow up, please get all Hospital records sent to your Prim MD by signing hospital release before you go home.  If you experience worsening of your admission symptoms, develop shortness of breath, life threatening emergency, suicidal or homicidal thoughts you must seek medical attention immediately by calling 911 or calling your MD immediately  if symptoms less severe.  You Must read complete instructions/literature along with all the possible adverse reactions/side effects for all the Medicines you take and that have been prescribed to you. Take any new Medicines after you have completely understood and accpet all the possible adverse reactions/side effects.            Person Under Monitoring Name: Edward Mejia  Location: [place] [address]   Infection Prevention Recommendations for Individuals Confirmed to have, or Being Evaluated for, 2019 Novel Coronavirus (COVID-19) Infection Who Receive Care at Home  Individuals who are confirmed to have, or are being evaluated for, COVID-19 should follow the prevention steps below until a healthcare provider or local or state health department says they can return to  normal activities.  Stay home except to get medical care You should restrict activities outside your home, except for getting medical care. Do not go to work, school, or public areas, and do not use public transportation or taxis.  Call ahead before visiting your doctor Before your medical appointment, call the healthcare provider and tell them that you have, or are being evaluated for, COVID-19 infection. This will help the healthcare provider's office take steps to keep other people from getting infected. Ask your healthcare provider to call the local or state health department.  Monitor your symptoms Seek prompt medical attention if your illness is worsening (e.g., difficulty breathing). Before going to your medical appointment, call the healthcare provider and tell them that you have, or are being evaluated for, COVID-19 infection. Ask your healthcare provider to call the local or state health department.  Wear a facemask You should wear a facemask that covers your nose and mouth when you are in the same room with other people and when you visit a healthcare provider. People who live with or visit you should also wear a facemask while they are in the same room with you.  Separate yourself from other people in your home As much as possible, you should stay in a different room from other people in your home. Also, you should use a separate bathroom, if available.  Avoid sharing household items You should not share dishes, drinking glasses, cups, eating utensils, towels, bedding, or other items with other people in your home. After using these items, you  should wash them thoroughly with soap and water.  Cover your coughs and sneezes Cover your mouth and nose with a tissue when you cough or sneeze, or you can cough or sneeze into your sleeve. Throw used tissues in a lined trash can, and immediately wash your hands with soap and water for at least 20 seconds or use an alcohol-based  hand rub.  Wash your [facility] your hands often and thoroughly with soap and water for at least 20 seconds. You can use an alcohol-based hand sanitizer if soap and water are not available and if your hands are not visibly dirty. Avoid touching your eyes, nose, and mouth with unwashed hands.   Prevention Steps for Caregivers and Household Members of Individuals Confirmed to have, or Being Evaluated for, COVID-19 Infection Being Cared for in the Home  If you live with, or provide care at home for, a person confirmed to have, or being evaluated for, COVID-19 infection please follow these guidelines to prevent infection:  Follow healthcare provider's instructions Make sure that you understand and can help the patient follow any healthcare provider instructions for all care.  Provide for the patient's basic needs You should help the patient with basic needs in the home and provide support for getting groceries, prescriptions, and other personal needs.  Monitor the patient's symptoms If they are getting sicker, call his or her medical provider and tell them that the patient has, or is being evaluated for, COVID-19 infection. This will help the healthcare provider's office take steps to keep other people from getting infected. Ask the healthcare provider to call the local or state health department.  Limit the number of people who have contact with the patient  If possible, have only one caregiver for the patient.  Other household members should stay in another home or place of residence. If this is not possible, they should stay  in another room, or be separated from the patient as much as possible. Use a separate bathroom, if available.  Restrict visitors who do not have an essential need to be in the home.  Keep older adults, very young children, and other sick people away from the patient Keep older adults, very young children, and those who have compromised immune systems or  chronic health conditions away from the patient. This includes people with chronic heart, lung, or kidney conditions, diabetes, and cancer.  Ensure good ventilation Make sure that shared spaces in the home have good air flow, such as from an air conditioner or an opened window, weather permitting.  Wash your hands often  Wash your hands often and thoroughly with soap and water for at least 20 seconds. You can use an alcohol based hand sanitizer if soap and water are not available and if your hands are not visibly dirty.  Avoid touching your eyes, nose, and mouth with unwashed hands.  Use disposable paper towels to dry your hands. If not available, use dedicated cloth towels and replace them when they become wet.  Wear a facemask and gloves  Wear a disposable facemask at all times in the room and gloves when you touch or have contact with the patient's blood, body fluids, and/or secretions or excretions, such as sweat, saliva, sputum, nasal mucus, vomit, urine, or feces.  Ensure the mask fits over your nose and mouth tightly, and do not touch it during use.  Throw out disposable facemasks and gloves after using them. Do not reuse.  Wash your hands immediately after removing your  facemask and gloves.  If your personal clothing becomes contaminated, carefully remove clothing and launder. Wash your hands after handling contaminated clothing.  Place all used disposable facemasks, gloves, and other waste in a lined container before disposing them with other household waste.  Remove gloves and wash your hands immediately after handling these items.  Do not share dishes, glasses, or other household items with the patient  Avoid sharing household items. You should not share dishes, drinking glasses, cups, eating utensils, towels, bedding, or other items with a patient who is confirmed to have, or being evaluated for, COVID-19 infection.  After the person uses these items, you should wash them  thoroughly with soap and water.  Wash laundry thoroughly  Immediately remove and wash clothes or bedding that have blood, body fluids, and/or secretions or excretions, such as sweat, saliva, sputum, nasal mucus, vomit, urine, or feces, on them.  Wear gloves when handling laundry from the patient.  Read and follow directions on labels of laundry or clothing items and detergent. In general, wash and dry with the warmest temperatures recommended on the label.  Clean all areas the individual has used often  Clean all touchable surfaces, such as counters, tabletops, doorknobs, bathroom fixtures, toilets, phones, keyboards, tablets, and bedside tables, every day. Also, clean any surfaces that may have blood, body fluids, and/or secretions or excretions on them.  Wear gloves when cleaning surfaces the patient has come in contact with.  Use a diluted bleach solution (e.g., dilute bleach with 1 part bleach and 10 parts water) or a household disinfectant with a label that says EPA-registered for coronaviruses. To make a bleach solution at home, add 1 tablespoon of bleach to 1 quart (4 cups) of water. For a larger supply, add  cup of bleach to 1 gallon (16 cups) of water.  Read labels of cleaning products and follow recommendations provided on product labels. Labels contain instructions for safe and effective use of the cleaning product including precautions you should take when applying the product, such as wearing gloves or eye protection and making sure you have good ventilation during use of the product.  Remove gloves and wash hands immediately after cleaning.  Monitor yourself for signs and symptoms of illness Caregivers and household members are considered close contacts, should monitor their health, and will be asked to limit movement outside of the home to the extent possible. Follow the monitoring steps for close contacts listed on the symptom monitoring form.   ? If you have additional  questions, contact your local health department or call the epidemiologist on call at [phone] (available 24/7). ? This guidance is subject to change. For the most up-to-date guidance from [facility], please refer to their website: YouBlogs.pl     Information on my medicine - ELIQUIS (apixaban)  Why was Eliquis prescribed for you? Eliquis was prescribed to treat blood clots that may have been found in the veins of your legs (deep vein thrombosis) or in your lungs (pulmonary embolism) and to reduce the risk of them occurring again.  What do You need to know about Eliquis ? The starting dose is 10 mg (two 5 mg tablets) taken TWICE daily for the FIRST SEVEN (7) DAYS, then on (enter date)  04/23/2019  the dose is reduced to ONE 5 mg tablet taken TWICE daily.  Eliquis may be taken with or without food.   Try to take the dose about the same time in the morning and in the evening. If you have difficulty  swallowing the tablet whole please discuss with your pharmacist how to take the medication safely.  Take Eliquis exactly as prescribed and DO NOT stop taking Eliquis without talking to the doctor who prescribed the medication.  Stopping may increase your risk of developing a new blood clot.  Refill your prescription before you run out.  After discharge, you should have regular check-up appointments with your healthcare provider that is prescribing your Eliquis.    What do you do if you miss a dose? If a dose of ELIQUIS is not taken at the scheduled time, take it as soon as possible on the same day and twice-daily administration should be resumed. The dose should not be doubled to make up for a missed dose.  Important Safety Information A possible side effect of Eliquis is bleeding. You should call your healthcare provider right away if you experience any of the following: ? Bleeding from an injury or your nose that does not  stop. ? Unusual colored urine (red or dark brown) or unusual colored stools (red or black). ? Unusual bruising for unknown reasons. ? A serious fall or if you hit your head (even if there is no bleeding).  Some medicines may interact with Eliquis and might increase your risk of bleeding or clotting while on Eliquis. To help avoid this, consult your healthcare provider or pharmacist prior to using any new prescription or non-prescription medications, including herbals, vitamins, non-steroidal anti-inflammatory drugs (NSAIDs) and supplements.  This website has more information on Eliquis (apixaban): http://www.eliquis.com/eliquis/home

## 2019-04-15 NOTE — Progress Notes (Signed)
ANTICOAGULATION CONSULT NOTE - Initial Consult  Pharmacy Consult for Eliquis Indication: BL PE, IVC thrombi, extensive LLE DVT  Allergies  Allergen Reactions  . Pravachol [Pravastatin] Other (See Comments)    Caused FATIGUE    Patient Measurements: Height: 6' (182.9 cm) Weight: 214 lb 15.2 oz (97.5 kg) IBW/kg (Calculated) : 77.6   Vital Signs: Temp: 97.6 F (36.4 C) (03/08 0800) Temp Source: Axillary (03/08 0800) BP: 139/85 (03/08 0800) Pulse Rate: 77 (03/08 0800)  Labs: Recent Labs    04/12/19 1045 04/12/19 1227 04/12/19 2023 04/12/19 2145 04/13/19 0418 04/13/19 0418 04/13/19 0805 04/14/19 0209 04/15/19 0226  HGB   < >  --   --   --  17.4*   < >  --  16.0 16.2  HCT   < >  --   --   --  53.3*  --   --  47.1 48.9  PLT   < >  --   --   --  247  --   --  233 266  LABPROT  --  16.2*  --   --   --   --   --   --   --   INR  --  1.3*  --   --   --   --   --   --   --   HEPARINUNFRC  --   --  <0.10*  --  0.29*  --   --   --   --   CREATININE   < >  --   --   --  1.13  --   --  1.00 0.91  TROPONINIHS   < >  --  356* 403*  --   --  317*  --   --    < > = values in this interval not displayed.    Estimated Creatinine Clearance: 86.2 mL/min (by C-G formula based on SCr of 0.91 mg/dL).   Medical History: Past Medical History:  Diagnosis Date  . Arthritis   . Cancer ([place])    skin cancer  . Cellulitis   . Cellulitis   . H/O adenomatous polyp of colon 03/24/2015  . History of brain disorder: history of amaurosis fugax 03/24/2015  . History of rheumatic fever 03/24/2015   DID Have Rheumatic Fever.    . Hypertension   . Peptic ulcer disease    Assessment: Anticoag: Hep > enox for BL PE, IVC thrombi, extensive LLE DVT. CBC stable and WNL. No reported bleeding. Ddimer 2.26. No vascular intervention planned. Convert to Eliquis on 3/9. - CTA extensive bilateral PE with elevated RV/LV ratio 1.1.   Goal of Therapy:  Therapeutic oral anticoagulation   Plan:  Cont enox  95mg  q12h >>Start Eliquis on 3/9 Eliquis 10mg  BID x 7d, then 5mg  BID Phos: NaPhos 63mmol x 1 per Dr. Rosalio Loud. Alford Highland, PharmD, BCPS Clinical Staff Pharmacist [email or web address]  Alford Highland, [place] 04/15/2019,9:08 AM

## 2019-04-15 NOTE — Progress Notes (Signed)
Occupational Therapy Treatment Patient Details Name: Edward Mejia MRN: [id number] DOB: 03-13-1945 Today's Date: 04/15/2019    History of present illness  Edward Mejia is a 74 y.o. male with medical history significant of hypertension, hyperlipidemia, recently diagnosed with Covid pneumonia 2 weeks ago and was hospitalized from 04/03/2019 to 04/07/2019 at [facility] presented to emergency department due to left lower extremity swelling, shortness of breath and generalized weakness, work-up was consistent with bilateral large PEs, with large left lower extremity DVT, blood clots in IVC and left external iliac vein.    OT comments  Pt progressing towards OT goals. Pt received in bed and agreeable to participate in therapy. Pt Supervision for bed mobility to sit EOB and return to supine with appropriate pillow positioning under LLE. Pt Min A for initial sit to stand transfer with RW, min guard for mobility to sink/bathroom with RW. Pt setup for oral care and washing face seated at sink, then reported need to use bathroom. Pt min guard for toilet transfer with cues to use grab bar for stability. Pt Supervision for posterior hygiene after BM leaning side to side, min guard in standing to ensure no dizziness after this task. Pt required Min A for mobility back to bed with assistance needed to navigate RW out of bathroom and around sink. SpO2 readings on RA did drop in 80s, but unsure if due to pt using B hands for RW mobility. Pt did endorse SOB, but able to recover within 1 minute with cues for pursed lip breathing.    Follow Up Recommendations  Home health OT;Supervision/Assistance - 24 hour    Equipment Recommendations  None recommended by OT    Recommendations for Other Services      Precautions / Restrictions Precautions Precautions: Fall Precaution Comments: cognition (is HOH a factor?) Restrictions Weight Bearing Restrictions: No       Mobility Bed Mobility Overal bed mobility: Needs  Assistance Bed Mobility: Supine to Sit;Sit to Supine     Supine to sit: Supervision Sit to supine: Supervision      Transfers Overall transfer level: Needs assistance Equipment used: Rolling walker (2 wheeled) Transfers: Sit to/from [facility] Sit to Stand: Min assist Stand pivot transfers: Min guard            Balance Overall balance assessment: Needs assistance Sitting-balance support: Feet supported Sitting balance-Leahy Scale: Good     Standing balance support: During functional activity;Bilateral upper extremity supported Standing balance-Leahy Scale: Fair                             ADL either performed or assessed with clinical judgement   ADL Overall ADL's : Needs assistance/impaired     Grooming: Set up;Oral care;Wash/dry hands;Wash/dry face;Sitting Grooming Details (indicate cue type and reason): Pt completed denture care and washing face while seated. Pt reports sore back and fatigue from preventing completion of task in standing                 Toilet Transfer: Min guard;Ambulation;Regular Toilet;Grab bars;RW Armed forces technical officer Details (indicate cue type and reason): min guard to ensure safety due to mild unsteadiness Toileting- Clothing Manipulation and Hygiene: Min guard;Sit to/from stand;Sitting/lateral lean Toileting - Clothing Manipulation Details (indicate cue type and reason): Pt able to complete peri care with increased time and lateral leans, min guard to ensure balance/safety after standing s/p BM      Functional mobility during ADLs: Min guard;Minimal assistance;Rolling  walker General ADL Comments: Pt initially Min guard for mobility to bathroom with RW, when returning, pt Min A for RW navigation out of bathroom and around sink - pt became fatigued and SOB     Vision       Perception     Praxis      Cognition Arousal/Alertness: Awake/alert Behavior During Therapy: WFL for tasks  assessed/performed Overall Cognitive Status: Impaired/Different from baseline Area of Impairment: Attention;Following commands;Memory;Safety/judgement                   Current Attention Level: Selective Memory: Decreased short-term memory Following Commands: Follows multi-step commands inconsistently Safety/Judgement: Decreased awareness of safety   Problem Solving: Difficulty sequencing;Requires verbal cues          Exercises     Shoulder Instructions       General Comments Pt on RA, brief drops into 80s, but this occurred while pt using hands with finger probe attached. Pt did become SOB after ambulation back to bed, but recovered and breathing calmed within 1 minute    Pertinent Vitals/ Pain       Pain Assessment: Faces Faces Pain Scale: Hurts little more Pain Location: low back (has been worse since covid) Pain Descriptors / Indicators: Aching;Sore Pain Intervention(s): Monitored during session  Home Living                                          Prior Functioning/Environment              Frequency  Min 3X/week        Progress Toward Goals  OT Goals(current goals can now be found in the care plan section)  Progress towards OT goals: Progressing toward goals  Acute Rehab OT Goals Patient Stated Goal: get better, decrease pain OT Goal Formulation: With patient Time For Goal Achievement: 04/28/19 Potential to Achieve Goals: Good ADL Goals Pt Will Perform Grooming: with supervision;standing Pt Will Perform Lower Body Dressing: with supervision;sit to/from stand Pt Will Transfer to Toilet: with supervision;ambulating Pt Will Perform Toileting - Clothing Manipulation and hygiene: with modified independence;sit to/from stand Pt Will Perform Tub/Shower Transfer: with supervision;3 in 1;rolling walker Pt/caregiver will Perform Home Exercise Program: Both right and left upper extremity;With theraband;With written HEP  provided Additional ADL Goal #1: Pt will verbalize 3 energy conservation strategies to implement during ADL routine with no verbal cues Additional ADL Goal #2: Pt to tolerate standing up to 10 min with modified independence in preparation for ADLs. Additional ADL Goal #3: Pt to recall and verbalize 3 fall prevention strategies with 0 verbal cues.  Plan Discharge plan remains appropriate    Co-evaluation                 AM-PAC OT "6 Clicks" Daily Activity     Outcome Measure   Help from another person eating meals?: None Help from another person taking care of personal grooming?: A Little Help from another person toileting, which includes using toliet, bedpan, or urinal?: A Little Help from another person bathing (including washing, rinsing, drying)?: A Little Help from another person to put on and taking off regular upper body clothing?: A Little Help from another person to put on and taking off regular lower body clothing?: A Lot 6 Click Score: 18    End of Session Equipment Utilized During Treatment: Gait belt;Rolling walker  OT Visit  Diagnosis: Unsteadiness on feet (R26.81);Muscle weakness (generalized) (M62.81)   Activity Tolerance Patient tolerated treatment well   Patient Left in bed;with call bell/phone within reach;with bed alarm set   Nurse Communication Other (comment);Mobility status(Pt w/ first BM in 10 days)        Time: [id number] OT Time Calculation (min): 48 min  Charges: OT General Charges $OT Visit: 1 Visit OT Treatments $Self Care/Home Management : 38-52 mins  Layla Maw, OTR/L   Layla Maw 04/15/2019, 3:59 PM

## 2019-04-15 NOTE — TOC Benefit Eligibility Note (Signed)
Transition of Care [facility]) Benefit Eligibility Note    Patient Details  Name: Edward Mejia MRN: [id number] Date of Birth: 1945/12/23   Medication/Dose: Eliquis BID  Covered?: Yes  Tier: 3 Drug     Spoke with Person/Company/Phone Number:: [facility] Medicare [id number]  Co-Pay: $47 for 30 day retail  Prior Approval: [phone] op 5)          Delorse Lek Phone Number: 04/15/2019, 11:40 AM

## 2019-04-15 NOTE — Care Management (Addendum)
CM submitted benefit check for Eliquis.  CM printed both free 30 day card to nurse station and requested secretary to place on shadow chart.  Bedside nurse requested to provide directly to pt.    Update:  CM informed attending that PA is required for Eliquis.  Prior Approval: [id number] op 5).  Attending requests that PCP call in PA.  CM unable to reach pt however was able to reach pts wife via phone  Wife in agreement with CM contacting PCP.  CM made follow up appt with Dr Caryn Section for 05/03/19 at 3pm.  CM also informed both office and wife that PA is required for next refill.

## 2019-04-15 NOTE — Progress Notes (Signed)
PROGRESS NOTE                                                                                                                                                                                                             Patient Demographics:    Edward Mejia, is a 74 y.o. male, DOB - 01/31/1946, [id number]  Outpatient Primary MD for the patient is Caryn Section Kirstie Peri, MD    LOS - 3  Admit date - 04/12/2019    Chief Complaint  Patient presents with  . Weakness       Brief Narrative  Edward Mejia is a 74 y.o. male with medical history significant of hypertension, hyperlipidemia, recently diagnosed with Covid pneumonia 2 weeks ago and was hospitalized from 04/03/2019 to 04/07/2019 at [facility] presented to emergency department due to left lower extremity swelling, shortness of breath and generalized weakness, work-up was consistent with bilateral large PEs, with large left lower extremity DVT, blood clots in IVC and left external iliac vein.  He was admitted for further care.  He was seen by PCCM and vascular surgery in the ER.   Subjective:   Patient in bed, appears comfortable, denies any headache, no fever, no chest pain or pressure, no shortness of breath , no abdominal pain. No focal weakness.  Feels much better today.   Assessment  & Plan :      1. Acute Hypoxic Resp. Failure due to Acute bilateral PE - had recently been adequately treated for COVID-19 infection, likely hypercoagulable due to that, currently on therapeutic dose Lovenox, hemodynamically stable, continue oxygen supplementation and supportive care and monitor closely.  Seen by vascular surgeon Dr. Carlis Abbott Case discussed with him, clinically he is improving, left leg swelling is going down with TED stockings and elevation along with anticoagulation.  Breathing has improved.  Continue to advance activity titrate down oxygen, will switch to Eliquis on 04/16/2019 if  clinically stable and prepare for discharge thereafter.  Encouraged the patient to sit up in chair in the daytime use I-S and flutter valve for pulmonary toiletry and then prone in bed when at night.  SpO2: 97 % O2 Flow Rate (L/min): 1.5 L/min  Recent Labs  Lab 04/12/19 1045 04/12/19 2023 04/13/19 0418 04/13/19 0805 04/14/19 0209 04/15/19 0226  CRP  --  13.7* 16.6*  --  19.9* 7.8*  DDIMER  --  2.95* 2.89*  --  2.15* 2.26*  FERRITIN  --  496* 626*  --   --   --   BNP 676.3*  --   --   --  231.1* 211.3*  PROCALCITON  --  0.13  --  0.24 0.19 0.21    2.  Recent COVID-19 infection.  Adequately treated.  Supportive care only.  Was diagnosed on 02/17/2019.  No evidence of acute bacterial infection.  Stop all antibiotics.  3.  Left leg DVT, clot in IVC/left external iliac vein, large bilateral PE.  Full dose Lovenox with transition to Eliquis on 04/16/2019.  Keep left leg elevated, TED stockings.  Case discussed with vascular surgery as above.  4.  GERD.  PPI and Misoprostol.  5.  History of psoriatic arthritis.  Resume home medications once acute issues have is resolved.  6. Hyponatremia - stop IVF, could be SIADH, monitor Osm and Na.     Condition - Fair  Family Communication  :  Wife 3/6, 3/7, 3/8  Code Status :  DNR  Diet :   Diet Order            Diet Heart Room service appropriate? Yes; Fluid consistency: Thin  Diet effective now               Disposition Plan  : Stay in PCU getting treatment with Lovenox for massive PE, DVT involving L.Leg and IVC and left external iliac vein.  Likely discharge home if stable on Eliquis on 04/16/2019 or 04/17/2019 depending on clinical progress.  Consults  :  VVS  Procedures  :    CTA - Bilat PE  Leg [place]  - RIGHT: - No evidence of common femoral vein obstruction.  LEFT: - Findings consistent with acute deep vein thrombosis involving the left common femoral vein, SF junction, left femoral vein, left proximal profunda vein, left  popliteal vein, left posterior tibial veins, left peroneal veins, left gastrocnemius veins, and EIV. - No cystic structure found in the popliteal fossa.  *See table(s) above for measurements and observations  US IVC/Iliac: There is evidence of acute thrombus involving the IVC. There is evidence of acute thrombus involving the left external iliac vein.    PUD Prophylaxis : PPI, Misoprostol   DVT Prophylaxis  :  Lovenox    Lab Results  Component Value Date   PLT 266 04/15/2019    Inpatient Medications  Scheduled Meds: . vitamin C  500 mg Oral Daily  . enoxaparin (LOVENOX) injection  95 mg Subcutaneous Q12H  . furosemide  40 mg Intravenous Once  . gabapentin  600 mg Oral Q1400  . HYDROcodone-acetaminophen  1 tablet Oral Q6H  . hydrocortisone  25 mg Rectal BID  . misoprostol  100 mcg Oral Q lunch  . pantoprazole  40 mg Oral BID AC  . ranolazine  500 mg Oral BID  . zinc sulfate  220 mg Oral Daily   Continuous Infusions: . sodium phosphate  Dextrose 5% IVPB     PRN Meds:.acetaminophen **OR** [DISCONTINUED] acetaminophen, albuterol, chlorpheniramine-HYDROcodone, guaiFENesin-dextromethorphan, nitroGLYCERIN, [DISCONTINUED] ondansetron **OR** ondansetron (ZOFRAN) IV, oxyCODONE-acetaminophen, senna-docusate  Antibiotics  :    Anti-infectives (From admission, onward)   Start     Dose/Rate Route Frequency Ordered Stop   04/13/19 2000  vancomycin (VANCOCIN) IVPB 1000 mg/200 mL premix  Status:  Discontinued     1,000 mg 200 mL/hr over 60 Minutes Intravenous Every 12 hours 04/13/19 0847 04/13/19 1017   04/13/19 0800  vancomycin (VANCOREADY) IVPB 1250 mg/250 mL  Status:  Discontinued     1,250 mg 166.7 mL/hr over 90 Minutes Intravenous Every 12 hours 04/12/19 1805 04/13/19 0847   04/13/19 0100  piperacillin-tazobactam (ZOSYN) IVPB 3.375 g  Status:  Discontinued     3.375 g 12.5 mL/hr over 240 Minutes Intravenous Every 8 hours 04/12/19 1805 04/13/19 1017   04/12/19 1815  vancomycin  (VANCOREADY) IVPB 2000 mg/400 mL     2,000 mg 200 mL/hr over 120 Minutes Intravenous  Once 04/12/19 1801 04/13/19 0102   04/12/19 1815  piperacillin-tazobactam (ZOSYN) IVPB 3.375 g     3.375 g 100 mL/hr over 30 Minutes Intravenous  Once 04/12/19 1801 04/12/19 2314       Time Spent in minutes  30   Lala Lund M.D on 04/15/2019 at 8:47 AM  To page go to [email or web address] - password [email or web address]  [facility] -  Office  [phone]   See all Orders from today for further details    Objective:   Vitals:   04/14/19 2013 04/15/19 0130 04/15/19 0400 04/15/19 0800  BP: (!) 150/93 (!) 151/88 129/78 139/85  Pulse: 74 71 62 77  Resp: 18 16 14 16   Temp: 97.9 F (36.6 C) 97.6 F (36.4 C) 97.8 F (36.6 C) 97.6 F (36.4 C)  TempSrc: Axillary Axillary Axillary Axillary  SpO2: 93% 97% 94% 97%  Weight:      Height:        Wt Readings from Last 3 Encounters:  04/14/19 97.5 kg  04/03/19 93.3 kg  04/02/19 99.8 kg     Intake/Output Summary (Last 24 hours) at 04/15/2019 0847 Last data filed at 04/15/2019 0600 Gross per 24 hour  Intake 600 ml  Output 2000 ml  Net -1400 ml     Physical Exam   Awake Alert, No new F.N deficits, Normal affect [place].AT,PERRAL Supple Neck,No JVD, No cervical lymphadenopathy appriciated.  Symmetrical Chest wall movement, Good air movement bilaterally, CTAB RRR,No Gallops, Rubs or new Murmurs, No Parasternal Heave +ve B.Sounds, Abd Soft, No tenderness, No organomegaly appriciated, No rebound - guarding or rigidity. No Cyanosis, left leg under TED stockings but less swollen than before    Data Review:    CBC Recent Labs  Lab 04/12/19 1045 04/13/19 0418 04/14/19 0209 04/15/19 0226  WBC 20.1* 23.9* 20.9* 21.9*  HGB 18.7* 17.4* 16.0 16.2  HCT 56.7* 53.3* 47.1 48.9  PLT 228 247 233 266  MCV 85.8 85.4 83.1 85.5  MCH 28.3 27.9 28.2 28.3  MCHC 33.0 32.6 34.0 33.1  RDW 17.5* 16.2* 16.1* 15.8*  LYMPHSABS  --   --  1.4 1.8  MONOABS  --   --  1.4*  1.8*  EOSABS  --   --  0.0 0.0  BASOSABS  --   --  0.1 0.1    Chemistries  Recent Labs  Lab 04/12/19 1045 04/12/19 2023 04/13/19 0418 04/14/19 0209 04/15/19 0226  NA 131*  --  133* 129* 130*  K 5.1  --  4.1 4.2 4.0  CL 101  --  100 99 94*  CO2 16*  --  17* 19* 23  GLUCOSE 102*  --  90 150* 113*  BUN 17  --  17 17 18   CREATININE 0.91  --  1.13 1.00 0.91  CALCIUM 8.0*  --  7.6* 7.4* 7.5*  MG  --  1.8 1.8 1.9 2.1  AST 29  --  23 21 28   ALT 23  --  23 23  31  ALKPHOS 49  --  47 50 55  BILITOT 2.6*  --  2.4* 1.4* 1.2   ------------------------------------------------------------------------------------------------------------------ No results for input(s): CHOL, HDL, LDLCALC, TRIG, CHOLHDL, LDLDIRECT in the last 72 hours.  Lab Results  Component Value Date   HGBA1C 5.8 (H) 04/04/2019   ------------------------------------------------------------------------------------------------------------------ No results for input(s): TSH, T4TOTAL, T3FREE, THYROIDAB in the last 72 hours.  Invalid input(s): FREET3  Cardiac Enzymes No results for input(s): CKMB, TROPONINI, MYOGLOBIN in the last 168 hours.  Invalid input(s): CK ------------------------------------------------------------------------------------------------------------------    Component Value Date/Time   BNP 211.3 (H) 04/15/2019 [id number]    Micro Results No results found for this or any previous visit (from the past 240 hour(s)).  Radiology Reports DG Chest 2 View  Result Date: 04/02/2019 CLINICAL DATA:  74 year old male with positive COVID-19 with worsening fatigue and weakness. EXAM: CHEST - 2 VIEW COMPARISON:  Chest radiograph dated 10/20/2017. FINDINGS: Bilateral diffuse hazy and streaky densities most consistent with multifocal pneumonia and in keeping with COVID-19. No lobar consolidation, pleural effusion, pneumothorax. Mild cardiomegaly. Atherosclerotic calcification of the aorta. No acute osseous pathology.  Upper abdominal surgical clips. IMPRESSION: Multifocal pneumonia. Clinical correlation and follow-up to resolution recommended. Electronically Signed   By: Anner Crete M.D.   On: 04/02/2019 18:37   CT Angio Chest PE W/Cm &/Or Wo Cm  Result Date: 04/12/2019 CLINICAL DATA:  Progressive shortness of breath. COVID-19. EXAM: CT ANGIOGRAPHY CHEST WITH CONTRAST TECHNIQUE: Multidetector CT imaging of the chest was performed using the standard protocol during bolus administration of intravenous contrast. Multiplanar CT image reconstructions and MIPs were obtained to evaluate the vascular anatomy. CONTRAST:  188mL OMNIPAQUE IOHEXOL 350 MG/ML SOLN COMPARISON:  Chest x-ray dated 04/12/2019 and 04/03/2019 and chest CT dated 04/03/2019 FINDINGS: Cardiovascular: The patient has developed numerous bilateral pulmonary emboli to all lobes of both lungs, most extensive in the right lung. Elevated RV LV ratio of 1.1. Aortic atherosclerosis. Coronary artery calcifications. No pericardial effusion. Mediastinum/Nodes: No enlarged mediastinal, hilar, or axillary lymph nodes. Thyroid gland, trachea, and esophagus demonstrate no significant findings. Lungs/Pleura: Extensive primarily peripheral pulmonary infiltrates are minimally changed since the prior study. No effusions. Upper Abdomen: No acute abnormality. Musculoskeletal: No chest wall abnormality. No acute or significant osseous findings. Review of the MIP images confirms the above findings. IMPRESSION: Extensive bilateral pulmonary emboli with elevated RV LV ratio of 1.1. No significant change in the extensive peripheral bilateral pulmonary infiltrates. Aortic Atherosclerosis (ICD10-I70.0). Electronically Signed   By: Lorriane Shire M.D.   On: 04/12/2019 15:53   CT ANGIO CHEST PE W OR WO CONTRAST  Result Date: 04/03/2019 CLINICAL DATA:  Hypoxemia. COVID-19 infection. EXAM: CT ANGIOGRAPHY CHEST WITH CONTRAST TECHNIQUE: Multidetector CT imaging of the chest was performed  using the standard protocol during bolus administration of intravenous contrast. Multiplanar CT image reconstructions and MIPs were obtained to evaluate the vascular anatomy. CONTRAST:  59mL OMNIPAQUE IOHEXOL 350 MG/ML SOLN COMPARISON:  CT 01/01/2018. Radiographs 04/03/2019 and 04/02/2019. FINDINGS: Cardiovascular: The pulmonary arteries are well opacified with contrast to the level of the subsegmental branches. Subsegmental branch valuation is mildly limited by breathing artifact. No evidence of acute pulmonary embolism. There is atherosclerosis of the aorta, great vessels and coronary arteries. The heart size is normal. There is no pericardial effusion. Mediastinum/Nodes: There are no enlarged mediastinal, hilar or axillary lymph nodes. The thyroid gland, trachea and esophagus demonstrate no significant findings. Lungs/Pleura: No pleural effusion or pneumothorax. Multifocal ground-glass and airspace opacities are present peripherally in both lungs  consistent with viral pneumonia. In addition, there are mild dependent opacities in both lower lobes which are likely due to atelectasis. No endobronchial lesion identified. Upper abdomen: No acute findings are seen within the visualized upper abdomen. There are postsurgical changes within the stomach consistent with gastric bypass. Possible mild dilatation of the ventral pancreatic duct in the pancreatic head (image 155/4), similar to previous study. Musculoskeletal/Chest wall: There is no chest wall mass or suspicious osseous finding. Review of the MIP images confirms the above findings. IMPRESSION: 1. No evidence of acute pulmonary embolism. 2. Multifocal ground-glass and airspace opacities peripherally in both lungs consistent with viral pneumonia. 3. Coronary and aortic Atherosclerosis (ICD10-I70.0). Electronically Signed   By: Richardean Sale M.D.   On: 04/03/2019 17:36   VAS [place] IVC/ILIAC (VENOUS ONLY)  Result Date: 04/12/2019 IVC/ILIAC STUDY Indications: DVT  left leg Limitations: Air/bowel gas.  Performing Technologist: Baldwin Crown ARDMS, RVT  Examination Guidelines: A complete evaluation includes B-mode imaging, spectral Doppler, color Doppler, and power Doppler as needed of all accessible portions of each vessel. Bilateral testing is considered an integral part of a complete examination. Limited examinations for reoccurring indications may be performed as noted.  IVC/Iliac Findings: +----------+------+--------+--------+    IVC    PatentThrombusComments +----------+------+--------+--------+ IVC Distalpatent acute           +----------+------+--------+--------+  +---------------+---------+-----------+---------+-----------+-----------------+       CIV      RT-PatentRT-ThrombusLT-PatentLT-Thrombus    Comments      +---------------+---------+-----------+---------+-----------+-----------------+ Common Iliac                                            Not visualized   Mid                                                    due to overlying                                                             bowel/gas     +---------------+---------+-----------+---------+-----------+-----------------+  +-------------------------+---------+-----------+---------+-----------+--------+            EIV           RT-PatentRT-ThrombusLT-PatentLT-ThrombusComments +-------------------------+---------+-----------+---------+-----------+--------+ External Iliac Vein Mid                                  acute            +-------------------------+---------+-----------+---------+-----------+--------+ External Iliac Vein                                      acute            Distal                                                                    +-------------------------+---------+-----------+---------+-----------+--------+  Summary: IVC/Iliac: There is evidence of acute thrombus involving the IVC. There is evidence of acute  thrombus involving the left external iliac vein.  *See table(s) above for measurements and observations.  Electronically signed by Monica Martinez MD on 04/12/2019 at 4:39:37 PM.   Final    DG Chest Port 1 View  Result Date: 04/14/2019 CLINICAL DATA:  Follow-up COVID-19 pneumonia. Current history of pulmonary emboli. EXAM: PORTABLE CHEST 1 VIEW COMPARISON:  04/12/2019 and earlier, including CTA chest 04/12/2019. FINDINGS: Cardiac silhouette normal in size for AP portable technique, unchanged. Peripheral airspace opacities throughout both lungs, progressive since the examination 2 days ago. No visible pleural effusions. Pulmonary vascularity normal. IMPRESSION: Progressive peripheral airspace opacities throughout both lungs since the examination 2 days ago, indicating worsening pneumonia. Electronically Signed   By: Evangeline Dakin M.D.   On: 04/14/2019 08:03   DG Chest Port 1 View  Result Date: 04/12/2019 CLINICAL DATA:  Weakness, shortness of breath EXAM: PORTABLE CHEST 1 VIEW COMPARISON:  04/03/2019 FINDINGS: Patchy airspace disease within the lungs bilaterally concerning for pneumonia. This has increased since prior study, particularly in the left lung. Heart is normal size. No effusions or acute bony abnormality. IMPRESSION: Progressive patchy bilateral airspace disease, particularly in the left lung concerning for pneumonia. Electronically Signed   By: Rolm Baptise M.D.   On: 04/12/2019 10:43   DG Chest Port 1 View  Result Date: 04/03/2019 CLINICAL DATA:  Shortness of breath in a patient who is COVID-19 positive. EXAM: PORTABLE CHEST 1 VIEW COMPARISON:  PA and lateral chest 04/02/2019 and 05/01/2017. FINDINGS: Patchy bilateral airspace disease seen on the most recent examination persists without notable change. Heart size is normal. No pneumothorax or pleural fluid. No acute or focal bony abnormality. IMPRESSION: No change of patchy bilateral airspace disease consistent with pneumonia. Electronically  Signed   By: Inge Rise M.D.   On: 04/03/2019 10:14   ECHOCARDIOGRAM COMPLETE  Result Date: 04/13/2019    ECHOCARDIOGRAM REPORT   Patient Name:   Edward Mejia Date of Exam: 04/13/2019 Medical Rec #:  [id number]         Height:       72.0 in Accession #:    [id number]        Weight:       212.5 lb Date of Birth:  03/03/1945        BSA:          2.186 m Patient Age:    80 years          BP:           123/77 mmHg Patient Gender: M                 HR:           91 bpm. Exam Location:  Inpatient Procedure: 2D Echo Indications:    Pulmonary Embolus 415.19 / I26.99  History:        Patient has no prior history of Echocardiogram examinations.                 Risk Factors:Hypertension. COVID-19 virus infection                 Sepsis.  Sonographer:    Vikki Ports Turrentine Referring Phys: [id number] Mckinley Jewel  Sonographer Comments: Technically difficult to accurately assess IVC diameter and collapse due to patient respiratory motion and body habitus. IMPRESSIONS  1. Normal LV systolic function; grade 1 diastolic dysfunction; probable mobile plaque in  aortic arch.  2. Left ventricular ejection fraction, by estimation, is 60 to 65%. The left ventricle has normal function. The left ventricle has no regional wall motion abnormalities. Left ventricular diastolic parameters are consistent with Grade I diastolic dysfunction (impaired relaxation).  3. Right ventricular systolic function is normal. The right ventricular size is normal.  4. The mitral valve is normal in structure and function. Trivial mitral valve regurgitation. No evidence of mitral stenosis.  5. The aortic valve is tricuspid. Aortic valve regurgitation is not visualized. No aortic stenosis is present.  6. The inferior vena cava is normal in size with greater than 50% respiratory variability, suggesting right atrial pressure of 3 mmHg. FINDINGS  Left Ventricle: Left ventricular ejection fraction, by estimation, is 60 to 65%. The left ventricle has normal  function. The left ventricle has no regional wall motion abnormalities. The left ventricular internal cavity size was normal in size. There is  no left ventricular hypertrophy. Left ventricular diastolic parameters are consistent with Grade I diastolic dysfunction (impaired relaxation). Right Ventricle: The right ventricular size is normal. Right ventricular systolic function is normal. Left Atrium: Left atrial size was normal in size. Right Atrium: Right atrial size was normal in size. Pericardium: Trivial pericardial effusion is present. Mitral Valve: The mitral valve is normal in structure and function. Normal mobility of the mitral valve leaflets. Trivial mitral valve regurgitation. No evidence of mitral valve stenosis. Tricuspid Valve: The tricuspid valve is normal in structure. Tricuspid valve regurgitation is mild . No evidence of tricuspid stenosis. Aortic Valve: The aortic valve is tricuspid. Aortic valve regurgitation is not visualized. No aortic stenosis is present. Pulmonic Valve: The pulmonic valve was normal in structure. Pulmonic valve regurgitation is trivial. No evidence of pulmonic stenosis. Aorta: The aortic root is normal in size and structure. Venous: The inferior vena cava is normal in size with greater than 50% respiratory variability, suggesting right atrial pressure of 3 mmHg. IAS/Shunts: No atrial level shunt detected by color flow Doppler. Additional Comments: Normal LV systolic function; grade 1 diastolic dysfunction; probable mobile plaque in aortic arch.  LEFT VENTRICLE PLAX 2D LVIDd:         4.27 cm  Diastology LVIDs:         2.70 cm  LV e' lateral:   6.96 cm/s LV PW:         1.11 cm  LV E/e' lateral: 8.5 LV IVS:        1.14 cm  LV e' medial:    5.55 cm/s LVOT diam:     2.30 cm  LV E/e' medial:  10.7 LV SV:         72 LV SV Index:   33 LVOT Area:     4.15 cm  RIGHT ATRIUM           Index RA Area:     17.80 cm RA Volume:   48.00 ml  21.96 ml/m  AORTIC VALVE LVOT Vmax:   97.90 cm/s  LVOT Vmean:  66.800 cm/s LVOT VTI:    0.173 m  AORTA Ao Root diam: 3.60 cm MITRAL VALVE                TRICUSPID VALVE MV Area (PHT): 3.27 cm     TR Peak grad:   23.0 mmHg MV Decel Time: 232 msec     TR Vmax:        240.00 cm/s MV E velocity: 59.40 cm/s MV A velocity: 107.00 cm/s  SHUNTS MV E/A  ratio:  0.56         Systemic VTI:  0.17 m                             Systemic Diam: 2.30 cm Kirk Ruths MD Electronically signed by Kirk Ruths MD Signature Date/Time: 04/13/2019/1:14:52 PM    Final    VAS [place] LOWER EXTREMITY VENOUS (DVT) (MC and WL 7a-7p)  Result Date: 04/12/2019  Lower Venous DVTStudy Indications: Pain, and Swelling.  Comparison Study: LEV 04-04-19, negative. Performing Technologist: Baldwin Crown ARDMS, RVT  Examination Guidelines: A complete evaluation includes B-mode imaging, spectral Doppler, color Doppler, and power Doppler as needed of all accessible portions of each vessel. Bilateral testing is considered an integral part of a complete examination. Limited examinations for reoccurring indications may be performed as noted. The reflux portion of the exam is performed with the patient in reverse Trendelenburg.  +-----+---------------+---------+-----------+----------+--------------+ RIGHTCompressibilityPhasicitySpontaneityPropertiesThrombus Aging +-----+---------------+---------+-----------+----------+--------------+ CFV  Full           Yes      Yes                                 +-----+---------------+---------+-----------+----------+--------------+   +---------+---------------+---------+-----------+----------+--------------+ LEFT     CompressibilityPhasicitySpontaneityPropertiesThrombus Aging +---------+---------------+---------+-----------+----------+--------------+ CFV      None           No       No                   Acute          +---------+---------------+---------+-----------+----------+--------------+ SFJ      None                                                         +---------+---------------+---------+-----------+----------+--------------+ FV Prox  None                                                        +---------+---------------+---------+-----------+----------+--------------+ FV Mid   None                                                        +---------+---------------+---------+-----------+----------+--------------+ FV DistalNone                                                        +---------+---------------+---------+-----------+----------+--------------+ PFV      None                                                        +---------+---------------+---------+-----------+----------+--------------+ POP      None  No       No                                  +---------+---------------+---------+-----------+----------+--------------+ PTV      None                                                        +---------+---------------+---------+-----------+----------+--------------+ PERO     None                                                        +---------+---------------+---------+-----------+----------+--------------+ Gastroc  None                                                        +---------+---------------+---------+-----------+----------+--------------+ EIV      None                                                        +---------+---------------+---------+-----------+----------+--------------+     Summary: RIGHT: - No evidence of common femoral vein obstruction.  LEFT: - Findings consistent with acute deep vein thrombosis involving the left common femoral vein, SF junction, left femoral vein, left proximal profunda vein, left popliteal vein, left posterior tibial veins, left peroneal veins, left gastrocnemius veins, and EIV. - No cystic structure found in the popliteal fossa.  *See table(s) above for measurements and observations. Electronically signed by Monica Martinez MD on 04/12/2019 at 4:39:23 PM.    Final    VAS [place] LOWER EXTREMITY VENOUS (DVT)  Result Date: 04/04/2019  Lower Venous DVTStudy Indications: Elevated ddimer.  Comparison Study: no prior Performing Technologist: Abram Sander RVS  Examination Guidelines: A complete evaluation includes B-mode imaging, spectral Doppler, color Doppler, and power Doppler as needed of all accessible portions of each vessel. Bilateral testing is considered an integral part of a complete examination. Limited examinations for reoccurring indications may be performed as noted. The reflux portion of the exam is performed with the patient in reverse Trendelenburg.  +---------+---------------+---------+-----------+----------+--------------+ RIGHT    CompressibilityPhasicitySpontaneityPropertiesThrombus Aging +---------+---------------+---------+-----------+----------+--------------+ CFV      Full           Yes      Yes                                 +---------+---------------+---------+-----------+----------+--------------+ SFJ      Full                                                        +---------+---------------+---------+-----------+----------+--------------+ FV Prox  Full                                                        +---------+---------------+---------+-----------+----------+--------------+  FV Mid   Full                                                        +---------+---------------+---------+-----------+----------+--------------+ FV DistalFull                                                        +---------+---------------+---------+-----------+----------+--------------+ PFV      Full                                                        +---------+---------------+---------+-----------+----------+--------------+ POP      Full           Yes      Yes                                 +---------+---------------+---------+-----------+----------+--------------+ PTV       Full                                                        +---------+---------------+---------+-----------+----------+--------------+ PERO     Full                                                        +---------+---------------+---------+-----------+----------+--------------+   +---------+---------------+---------+-----------+----------+--------------+ LEFT     CompressibilityPhasicitySpontaneityPropertiesThrombus Aging +---------+---------------+---------+-----------+----------+--------------+ CFV      Full           Yes      Yes                                 +---------+---------------+---------+-----------+----------+--------------+ SFJ      Full                                                        +---------+---------------+---------+-----------+----------+--------------+ FV Prox  Full                                                        +---------+---------------+---------+-----------+----------+--------------+ FV Mid   Full                                                        +---------+---------------+---------+-----------+----------+--------------+  FV DistalFull                                                        +---------+---------------+---------+-----------+----------+--------------+ PFV      Full                                                        +---------+---------------+---------+-----------+----------+--------------+ POP      Full           Yes      Yes                                 +---------+---------------+---------+-----------+----------+--------------+ PTV      Full                                                        +---------+---------------+---------+-----------+----------+--------------+ PERO     Full                                                        +---------+---------------+---------+-----------+----------+--------------+     Summary: BILATERAL: - No evidence of deep vein  thrombosis seen in the lower extremities, bilaterally.   *See table(s) above for measurements and observations. Electronically signed by Monica Martinez MD on 04/04/2019 at 7:02:03 PM.    Final

## 2019-04-15 NOTE — Plan of Care (Signed)
  Problem: Elimination: Goal: Will not experience complications related to bowel motility Outcome: Not Progressing   

## 2019-04-16 LAB — CBC WITH DIFFERENTIAL/PLATELET
Abs Immature Granulocytes: 1.2 10*3/uL — ABNORMAL HIGH (ref 0.00–0.07)
Basophils Absolute: 0.1 10*3/uL (ref 0.0–0.1)
Basophils Relative: 1 %
Eosinophils Absolute: 0 10*3/uL (ref 0.0–0.5)
Eosinophils Relative: 0 %
HCT: 51 % (ref 39.0–52.0)
Hemoglobin: 16.6 g/dL (ref 13.0–17.0)
Immature Granulocytes: 6 %
Lymphocytes Relative: 9 %
Lymphs Abs: 1.6 10*3/uL (ref 0.7–4.0)
MCH: 27.8 pg (ref 26.0–34.0)
MCHC: 32.5 g/dL (ref 30.0–36.0)
MCV: 85.4 fL (ref 80.0–100.0)
Monocytes Absolute: 1.9 10*3/uL — ABNORMAL HIGH (ref 0.1–1.0)
Monocytes Relative: 10 %
Neutro Abs: 13.8 10*3/uL — ABNORMAL HIGH (ref 1.7–7.7)
Neutrophils Relative %: 74 %
Platelets: 237 10*3/uL (ref 150–400)
RBC: 5.97 MIL/uL — ABNORMAL HIGH (ref 4.22–5.81)
RDW: 16.3 % — ABNORMAL HIGH (ref 11.5–15.5)
WBC: 18.5 10*3/uL — ABNORMAL HIGH (ref 4.0–10.5)
nRBC: 0 % (ref 0.0–0.2)

## 2019-04-16 LAB — COMPREHENSIVE METABOLIC PANEL
ALT: 32 U/L (ref 0–44)
AST: 26 U/L (ref 15–41)
Albumin: 2.4 g/dL — ABNORMAL LOW (ref 3.5–5.0)
Alkaline Phosphatase: 47 U/L (ref 38–126)
Anion gap: 13 (ref 5–15)
BUN: 18 mg/dL (ref 8–23)
CO2: 26 mmol/L (ref 22–32)
Calcium: 7.9 mg/dL — ABNORMAL LOW (ref 8.9–10.3)
Chloride: 95 mmol/L — ABNORMAL LOW (ref 98–111)
Creatinine, Ser: 1.05 mg/dL (ref 0.61–1.24)
GFR calc Af Amer: >60 mL/min (ref >60)
GFR calc non Af Amer: >60 mL/min (ref >60)
Glucose, Bld: 75 mg/dL (ref 70–99)
Potassium: 4.3 mmol/L (ref 3.5–5.1)
Sodium: 134 mmol/L — ABNORMAL LOW (ref 135–145)
Total Bilirubin: 1.5 mg/dL — ABNORMAL HIGH (ref 0.3–1.2)
Total Protein: 5 g/dL — ABNORMAL LOW (ref 6.5–8.1)

## 2019-04-16 LAB — D-DIMER, QUANTITATIVE: D-Dimer, Quant: 2.35 ug/mL-FEU — ABNORMAL HIGH (ref 0.00–0.50)

## 2019-04-16 LAB — PHOSPHORUS: Phosphorus: 2.1 mg/dL — ABNORMAL LOW (ref 2.5–4.6)

## 2019-04-16 LAB — BRAIN NATRIURETIC PEPTIDE: B Natriuretic Peptide: 155.1 pg/mL — ABNORMAL HIGH (ref 0.0–100.0)

## 2019-04-16 LAB — MAGNESIUM: Magnesium: 1.9 mg/dL (ref 1.7–2.4)

## 2019-04-16 LAB — C-REACTIVE PROTEIN: CRP: 2.8 mg/dL — ABNORMAL HIGH (ref <1.0)

## 2019-04-16 MED ORDER — VALSARTAN-HYDROCHLOROTHIAZIDE 160-12.5 MG PO TABS: 1.0000 | ORAL_TABLET | Freq: Every day | ORAL | 3 refills | Status: DC

## 2019-04-16 MED ORDER — DEXAMETHASONE SODIUM PHOSPHATE 10 MG/ML IJ SOLN
6.0000 mg | Freq: Once | INTRAMUSCULAR | Status: AC
  Administered 2019-04-16: 6 mg via INTRAVENOUS
  Filled 2019-04-16: qty 1

## 2019-04-16 MED ORDER — APIXABAN 5 MG PO TABS: 10.0000 mg | ORAL_TABLET | Freq: Two times a day (BID) | ORAL | 0 refills | Status: DC

## 2019-04-16 MED ORDER — FUROSEMIDE 10 MG/ML IJ SOLN
40.0000 mg | Freq: Once | INTRAMUSCULAR | Status: AC
  Administered 2019-04-16: 40 mg via INTRAVENOUS
  Filled 2019-04-16: qty 4

## 2019-04-16 MED ORDER — SODIUM PHOSPHATES 45 MMOLE/15ML IV SOLN
30.0000 mmol | Freq: Once | INTRAVENOUS | Status: AC
  Administered 2019-04-16: 30 mmol via INTRAVENOUS
  Filled 2019-04-16: qty 10

## 2019-04-16 MED ORDER — APIXABAN 5 MG PO TABS: 5.0000 mg | ORAL_TABLET | Freq: Two times a day (BID) | ORAL | 0 refills | Status: DC

## 2019-04-16 NOTE — TOC Transition Note (Addendum)
Transition of Care [facility]) - CM/SW Discharge Note   Patient Details  Name: Edward Mejia MRN: [id number] Date of Birth: [birth year]  [facility] CM/SW Contact:  Carles Collet, RN Phone Number: 04/16/2019, 9:32 AM   Clinical Narrative:    Spoke to wife, she is interested in [facility], would like information on cost of copay if any. She would like [place]. Notified liaison to contact wife to discuss this. No DME needs.   Called prior auth for eliquis, spoke w Misty K. Eliquis 10 mg is not covered, but will be covered through 30 day free card. Eliquis 5 mg is covered and does not need a prior auth. I have explained this to the patient's wife and sent her a pictue via a text message of an Eluis 30 day card to ensure that she has access to it and she understands to show it the pharmacist when she picks up his medication.     Final next level of care: Home w Home Health Services Barriers to Discharge: No Barriers Identified   Patient Goals and CMS Choice        Discharge Placement                       Discharge Plan and Services                          HH Arranged: RN, PT, OT, Nurse's Aide HH Agency: [place] Date [facility] Agency Contacted: 04/16/19 Time [place]: [phone] Representative spoke with at [place]: cory  Social Determinants of Health ([place]) Interventions     Readmission Risk Interventions Readmission Risk Prevention Plan 04/07/2019  Transportation Screening Complete  PCP or Specialist Appt within 5-7 Days Complete  Home Care Screening Complete  Medication Review (RN CM) Complete  Some recent data might be hidden

## 2019-04-16 NOTE — Progress Notes (Signed)
   04/16/19 1144  AVS Discharge Documentation  AVS Discharge Instructions Including Medications Provided to patient/caregiver;Placed in discharge packet for receiving facility  Name of Person Receiving AVS Discharge Instructions Including Medications Edward Mejia  Name of Clinician That Reviewed AVS Discharge Instructions Including Medications Venida Jarvis, RN

## 2019-04-16 NOTE — Discharge Summary (Signed)
Edward Mejia BWG:[id number] DOB: October 21, 1945 DOA: 04/12/2019  PCP: Birdie Sons, MD  Admit date: 04/12/2019  Discharge date: 04/16/2019  Admitted From: Home   Disposition:  Home   Recommendations for Outpatient Follow-up:   Follow up with PCP in 1-2 weeks  PCP Please obtain BMP/CBC, 2 view CXR in 1week,  (see Discharge instructions)   PCP Please follow up on the following pending results: Please check CBC, BMP and a two-view chest x-ray in 7 to 10 days.   Home Health: PT,RN   Equipment/Devices: None  Consultations: VVS  Discharge Condition: Stable    CODE STATUS: Full    Diet Recommendation: Heart Healthy   Diet Order            Diet - low sodium heart healthy        Diet Heart Room service appropriate? Yes; Fluid consistency: Thin  Diet effective now               Chief Complaint  Patient presents with  . Weakness     Brief history of present illness from the day of admission and additional interim summary     Edward Mejia a 74 y.o.malewith medical history significant ofhypertension, hyperlipidemia, recently diagnosed with Covid pneumonia 2 weeks ago and was [facility] 04/03/2019 [facility] to left lower extremity swelling, shortness of breath and generalized weakness, work-up was consistent with bilateral large PEs, with large left lower extremity DVT, blood clots in IVC and left external iliac vein.  He was admitted for further care.  He was seen by PCCM and vascular surgery in the ER.                                                                   Hospital Course   1. Acute Hypoxic Resp. Failure due to Acute bilateral PE - had recently been adequately treated for COVID-19 infection, likely hypercoagulable due to that,  currently on therapeutic dose Lovenox, hemodynamically stable, continue oxygen supplementation and supportive care and monitor closely.  Seen by vascular surgeon Dr. Carlis Abbott Case discussed with him, clinically he has shown excellent improvement and symptom-free on room air, left leg swelling is much improved.  Plan is to place him on Eliquis and discharge him home with outpatient PCP and vascular surgery follow-up, home PT RN also requested, he is stable on room air and eager to go home.   2.  Recent COVID-19 infection.  Adequately treated.  Supportive care only.  Was diagnosed on 02/17/2019.  No evidence of acute bacterial infection.  Stopped all antibiotics.   SpO2: 96 % O2 Flow Rate (L/min): 1.5 L/min  Recent Labs  Lab 04/12/19 1045 04/12/19 2023 04/13/19 0418 04/13/19 0805 04/14/19 0209 04/15/19 0226 04/16/19 0243  CRP  --  13.7* 16.6*  --  19.9* 7.8* 2.8*  DDIMER  --  2.95* 2.89*  --  2.15* 2.26* 2.35*  FERRITIN  --  496* 626*  --   --   --   --   BNP 676.3*  --   --   --  231.1* 211.3* 155.1*  PROCALCITON  --  0.13  --  0.24 0.19 0.21  --     Hepatic Function Latest Ref Rng & Units 04/16/2019 04/15/2019 04/14/2019  Total Protein 6.5 - 8.1 g/dL 5.0(L) 5.5(L) 5.2(L)  Albumin 3.5 - 5.0 g/dL 2.4(L) 2.3(L) 2.2(L)  AST 15 - 41 U/L '26 28 21  '$ ALT 0 - 44 U/L 32 31 23  Alk Phosphatase 38 - 126 U/L 47 55 50  Total Bilirubin 0.3 - 1.2 mg/dL 1.5(H) 1.2 1.4(H)  Bilirubin, Direct 0.0 - 0.2 mg/dL - - -     3.  Left leg DVT, clot in IVC/left external iliac vein, large bilateral PE.  Was on full dose Lovenox transition to Eliquis on 04/16/2019.  Keep left leg elevated, TED stockings.  Case discussed with vascular surgery as above.  4.  GERD.  PPI and Misoprostol.  5.  History of psoriatic arthritis.  Resume home medications once acute issues have is resolved.  6. Hyponatremia -SIADH improved with IVF.  Request PCP to repeat CBC and BMP in 7 to 10 days.  Discharge diagnosis     Principal  Problem:   Bilateral pulmonary embolism (HCC) Active Problems:   Dyslipidemia   Benign essential HTN   COVID-19 virus infection   Left leg DVT ([place])   HCAP (healthcare-associated pneumonia)   Sepsis ([place])    Discharge instructions    Discharge Instructions    Diet - low sodium heart healthy   Complete by: As directed    Discharge instructions   Complete by: As directed    Follow with Primary MD Birdie Sons, MD in 7 days   Get CBC, CMP, 2 view Chest X ray -  checked next visit within 1 week by Primary MD    Activity: As tolerated with Full fall precautions use walker/cane & assistance as needed.  Keep TED stockings on during the daytime.  You can remove this at night.  Keep your left leg elevated with 3 pillows at night.  Disposition Home    Diet: Heart Healthy   Special Instructions: If you have smoked or chewed Tobacco  in the last 2 yrs please stop smoking, stop any regular Alcohol  and or any Recreational drug use.  On your next visit with your primary care physician please Get Medicines reviewed and adjusted.  Please request your Prim.MD to go over all Hospital Tests and Procedure/Radiological results at the follow up, please get all Hospital records sent to your Prim MD by signing hospital release before you go home.  If you experience worsening of your admission symptoms, develop shortness of breath, life threatening emergency, suicidal or homicidal thoughts you must seek medical attention immediately by calling 911 or calling your MD immediately  if symptoms less severe.  You Must read complete instructions/literature along with all the possible adverse reactions/side effects for all the Medicines you take and that have been prescribed to you. Take any new Medicines after you have completely understood and accpet all the possible adverse reactions/side effects.   Increase activity slowly   Complete by: As directed    MyChart COVID-19 home monitoring program    Complete by: Apr 16, 2019  Is the patient willing to use the [place] for home monitoring?: Yes   Temperature monitoring   Complete by: Apr 16, 2019    After how many days would you like to receive a notification of this patient's flowsheet entries?: 1      Discharge Medications   Allergies as of 04/16/2019      Reactions   Pravachol [pravastatin] Other (See Comments)   Caused FATIGUE      Medication List    TAKE these medications   acetaminophen 500 MG tablet Commonly known as: TYLENOL Take 1,000 mg by mouth 3 (three) times daily as needed for mild pain or headache.   apixaban 5 MG Tabs tablet Commonly known as: ELIQUIS Take 2 tablets (10 mg total) by mouth 2 (two) times daily for 6 days.   apixaban 5 MG Tabs tablet Commonly known as: ELIQUIS Take 1 tablet (5 mg total) by mouth 2 (two) times daily. Start taking on: April 23, 2019   Enbrel 50 MG/ML injection Generic drug: etanercept Inject 50 mg into the skin every Thursday.   gabapentin 300 MG capsule Commonly known as: NEURONTIN Take 600 mg by mouth See admin instructions. Take 600 mg by mouth once daily at 4 PM   HYDROcodone-acetaminophen 7.5-325 MG tablet Commonly known as: NORCO Take 1 tablet by mouth 3 (three) times daily as needed for moderate pain. What changed: when to take this   hydrocortisone 25 MG suppository Commonly known as: ANUSOL-HC Place 1 suppository (25 mg total) rectally 2 (two) times daily.   isosorbide mononitrate 120 MG 24 hr tablet Commonly known as: IMDUR Take 120 mg by mouth daily.   misoprostol 100 MCG tablet Commonly known as: CYTOTEC Take 100 mcg by mouth daily with lunch.   nitroGLYCERIN 0.4 MG SL tablet Commonly known as: NITROSTAT Place 0.4 mg under the tongue every 5 (five) minutes as needed for chest pain.   pantoprazole 40 MG tablet Commonly known as: Protonix Take 1 tablet (40 mg total) by mouth 2 (two) times daily before a meal.   predniSONE 5 MG  tablet Commonly known as: DELTASONE Take 7.5 mg by mouth daily with lunch.   Proventil HFA 108 (90 Base) MCG/ACT inhaler Generic drug: albuterol Inhale 2 puffs into the lungs every 6 (six) hours as needed for wheezing or shortness of breath.   ranolazine 500 MG 12 hr tablet Commonly known as: RANEXA Take 500 mg by mouth in the morning and at bedtime.   tiZANidine 2 MG tablet Commonly known as: ZANAFLEX Take 2 mg by mouth at bedtime.   valsartan-hydrochlorothiazide 160-12.5 MG tablet Commonly known as: DIOVAN-HCT Take 1 tablet by mouth daily. Start taking on: April 19, 2019 What changed: These instructions start on April 19, 2019. If you are unsure what to do until then, ask your doctor or other care provider.       Follow-up Information    [facility] Follow up.   Specialty: Home Health Services Why: for home health services, they will call you in a few days to set up your first home appointment.  Contact information: [address] [place] [address] [phone]        Birdie Sons, MD. Schedule an appointment as soon as possible for a visit in 1 week(s).   Specialty: Family Medicine Contact information: [address] [place] [address] [phone]        Marty Heck, MD. Schedule an appointment as soon  as possible for a visit in 1 week(s).   Specialty: Vascular Surgery Contact information: [address] [place] [address] [phone]           Major procedures and Radiology Reports - PLEASE review detailed and final reports thoroughly  -         DG Chest 2 View  Result Date: 04/02/2019 CLINICAL DATA:  74 year old male with positive COVID-19 with worsening fatigue and weakness. EXAM: CHEST - 2 VIEW COMPARISON:  Chest radiograph dated 10/20/2017. FINDINGS: Bilateral diffuse hazy and streaky densities most consistent with multifocal pneumonia and in keeping with COVID-19. No lobar  consolidation, pleural effusion, pneumothorax. Mild cardiomegaly. Atherosclerotic calcification of the aorta. No acute osseous pathology. Upper abdominal surgical clips. IMPRESSION: Multifocal pneumonia. Clinical correlation and follow-up to resolution recommended. Electronically Signed   By: Anner Crete M.D.   On: 04/02/2019 18:37   CT Angio Chest PE W/Cm &/Or Wo Cm  Result Date: 04/12/2019 CLINICAL DATA:  Progressive shortness of breath. COVID-19. EXAM: CT ANGIOGRAPHY CHEST WITH CONTRAST TECHNIQUE: Multidetector CT imaging of the chest was performed using the standard protocol during bolus administration of intravenous contrast. Multiplanar CT image reconstructions and MIPs were obtained to evaluate the vascular anatomy. CONTRAST:  142m OMNIPAQUE IOHEXOL 350 MG/ML SOLN COMPARISON:  Chest x-ray dated 04/12/2019 and 04/03/2019 and chest CT dated 04/03/2019 FINDINGS: Cardiovascular: The patient has developed numerous bilateral pulmonary emboli to all lobes of both lungs, most extensive in the right lung. Elevated RV LV ratio of 1.1. Aortic atherosclerosis. Coronary artery calcifications. No pericardial effusion. Mediastinum/Nodes: No enlarged mediastinal, hilar, or axillary lymph nodes. Thyroid gland, trachea, and esophagus demonstrate no significant findings. Lungs/Pleura: Extensive primarily peripheral pulmonary infiltrates are minimally changed since the prior study. No effusions. Upper Abdomen: No acute abnormality. Musculoskeletal: No chest wall abnormality. No acute or significant osseous findings. Review of the MIP images confirms the above findings. IMPRESSION: Extensive bilateral pulmonary emboli with elevated RV LV ratio of 1.1. No significant change in the extensive peripheral bilateral pulmonary infiltrates. Aortic Atherosclerosis (ICD10-I70.0). Electronically Signed   By: JLorriane ShireM.D.   On: 04/12/2019 15:53   CT ANGIO CHEST PE W OR WO CONTRAST  Result Date: 04/03/2019 CLINICAL DATA:   Hypoxemia. COVID-19 infection. EXAM: CT ANGIOGRAPHY CHEST WITH CONTRAST TECHNIQUE: Multidetector CT imaging of the chest was performed using the standard protocol during bolus administration of intravenous contrast. Multiplanar CT image reconstructions and MIPs were obtained to evaluate the vascular anatomy. CONTRAST:  763mOMNIPAQUE IOHEXOL 350 MG/ML SOLN COMPARISON:  CT 01/01/2018. Radiographs 04/03/2019 and 04/02/2019. FINDINGS: Cardiovascular: The pulmonary arteries are well opacified with contrast to the level of the subsegmental branches. Subsegmental branch valuation is mildly limited by breathing artifact. No evidence of acute pulmonary embolism. There is atherosclerosis of the aorta, great vessels and coronary arteries. The heart size is normal. There is no pericardial effusion. Mediastinum/Nodes: There are no enlarged mediastinal, hilar or axillary lymph nodes. The thyroid gland, trachea and esophagus demonstrate no significant findings. Lungs/Pleura: No pleural effusion or pneumothorax. Multifocal ground-glass and airspace opacities are present peripherally in both lungs consistent with viral pneumonia. In addition, there are mild dependent opacities in both lower lobes which are likely due to atelectasis. No endobronchial lesion identified. Upper abdomen: No acute findings are seen within the visualized upper abdomen. There are postsurgical changes within the stomach consistent with gastric bypass. Possible mild dilatation of the ventral pancreatic duct in the pancreatic head (image 155/4), similar to previous study. Musculoskeletal/Chest wall: There is  no chest wall mass or suspicious osseous finding. Review of the MIP images confirms the above findings. IMPRESSION: 1. No evidence of acute pulmonary embolism. 2. Multifocal ground-glass and airspace opacities peripherally in both lungs consistent with viral pneumonia. 3. Coronary and aortic Atherosclerosis (ICD10-I70.0). Electronically Signed   By:  Richardean Sale M.D.   On: 04/03/2019 17:36   VAS [place] IVC/ILIAC (VENOUS ONLY)  Result Date: 04/12/2019 IVC/ILIAC STUDY Indications: DVT left leg Limitations: Air/bowel gas.  Performing Technologist: Baldwin Crown ARDMS, RVT  Examination Guidelines: A complete evaluation includes B-mode imaging, spectral Doppler, color Doppler, and power Doppler as needed of all accessible portions of each vessel. Bilateral testing is considered an integral part of a complete examination. Limited examinations for reoccurring indications may be performed as noted.  IVC/Iliac Findings: +----------+------+--------+--------+    IVC    PatentThrombusComments +----------+------+--------+--------+ IVC Distalpatent acute           +----------+------+--------+--------+  +---------------+---------+-----------+---------+-----------+-----------------+       CIV      RT-PatentRT-ThrombusLT-PatentLT-Thrombus    Comments      +---------------+---------+-----------+---------+-----------+-----------------+ Common Iliac                                            Not visualized   Mid                                                    due to overlying                                                             bowel/gas     +---------------+---------+-----------+---------+-----------+-----------------+  +-------------------------+---------+-----------+---------+-----------+--------+            EIV           RT-PatentRT-ThrombusLT-PatentLT-ThrombusComments +-------------------------+---------+-----------+---------+-----------+--------+ External Iliac Vein Mid                                  acute            +-------------------------+---------+-----------+---------+-----------+--------+ External Iliac Vein                                      acute            Distal                                                                     +-------------------------+---------+-----------+---------+-----------+--------+   Summary: IVC/Iliac: There is evidence of acute thrombus involving the IVC. There is evidence of acute thrombus involving the left external iliac vein.  *See table(s) above for measurements and observations.  Electronically signed by Monica Martinez MD on 04/12/2019 at 4:39:37 PM.  Final    DG Chest Port 1 View  Result Date: 04/14/2019 CLINICAL DATA:  Follow-up COVID-19 pneumonia. Current history of pulmonary emboli. EXAM: PORTABLE CHEST 1 VIEW COMPARISON:  04/12/2019 and earlier, including CTA chest 04/12/2019. FINDINGS: Cardiac silhouette normal in size for AP portable technique, unchanged. Peripheral airspace opacities throughout both lungs, progressive since the examination 2 days ago. No visible pleural effusions. Pulmonary vascularity normal. IMPRESSION: Progressive peripheral airspace opacities throughout both lungs since the examination 2 days ago, indicating worsening pneumonia. Electronically Signed   By: Evangeline Dakin M.D.   On: 04/14/2019 08:03   DG Chest Port 1 View  Result Date: 04/12/2019 CLINICAL DATA:  Weakness, shortness of breath EXAM: PORTABLE CHEST 1 VIEW COMPARISON:  04/03/2019 FINDINGS: Patchy airspace disease within the lungs bilaterally concerning for pneumonia. This has increased since prior study, particularly in the left lung. Heart is normal size. No effusions or acute bony abnormality. IMPRESSION: Progressive patchy bilateral airspace disease, particularly in the left lung concerning for pneumonia. Electronically Signed   By: Rolm Baptise M.D.   On: 04/12/2019 10:43   DG Chest Port 1 View  Result Date: 04/03/2019 CLINICAL DATA:  Shortness of breath in a patient who is COVID-19 positive. EXAM: PORTABLE CHEST 1 VIEW COMPARISON:  PA and lateral chest 04/02/2019 and 05/01/2017. FINDINGS: Patchy bilateral airspace disease seen on the most recent examination persists without notable change. Heart  size is normal. No pneumothorax or pleural fluid. No acute or focal bony abnormality. IMPRESSION: No change of patchy bilateral airspace disease consistent with pneumonia. Electronically Signed   By: Inge Rise M.D.   On: 04/03/2019 10:14   ECHOCARDIOGRAM COMPLETE  Result Date: 04/13/2019    ECHOCARDIOGRAM REPORT   Patient Name:   Edward Mejia Date of Exam: 04/13/2019 Medical Rec #:  [id number]         Height:       72.0 in Accession #:    [id number]        Weight:       212.5 lb Date of Birth:  1945-07-28        BSA:          2.186 m Patient Age:    43 years          BP:           123/77 mmHg Patient Gender: M                 HR:           91 bpm. Exam Location:  Inpatient Procedure: 2D Echo Indications:    Pulmonary Embolus 415.19 / I26.99  History:        Patient has no prior history of Echocardiogram examinations.                 Risk Factors:Hypertension. COVID-19 virus infection                 Sepsis.  Sonographer:    Vikki Ports Turrentine Referring Phys: [id number] Mckinley Jewel  Sonographer Comments: Technically difficult to accurately assess IVC diameter and collapse due to patient respiratory motion and body habitus. IMPRESSIONS  1. Normal LV systolic function; grade 1 diastolic dysfunction; probable mobile plaque in aortic arch.  2. Left ventricular ejection fraction, by estimation, is 60 to 65%. The left ventricle has normal function. The left ventricle has no regional wall motion abnormalities. Left ventricular diastolic parameters are consistent with Grade I diastolic dysfunction (impaired relaxation).  3. Right ventricular  systolic function is normal. The right ventricular size is normal.  4. The mitral valve is normal in structure and function. Trivial mitral valve regurgitation. No evidence of mitral stenosis.  5. The aortic valve is tricuspid. Aortic valve regurgitation is not visualized. No aortic stenosis is present.  6. The inferior vena cava is normal in size with greater than 50%  respiratory variability, suggesting right atrial pressure of 3 mmHg. FINDINGS  Left Ventricle: Left ventricular ejection fraction, by estimation, is 60 to 65%. The left ventricle has normal function. The left ventricle has no regional wall motion abnormalities. The left ventricular internal cavity size was normal in size. There is  no left ventricular hypertrophy. Left ventricular diastolic parameters are consistent with Grade I diastolic dysfunction (impaired relaxation). Right Ventricle: The right ventricular size is normal. Right ventricular systolic function is normal. Left Atrium: Left atrial size was normal in size. Right Atrium: Right atrial size was normal in size. Pericardium: Trivial pericardial effusion is present. Mitral Valve: The mitral valve is normal in structure and function. Normal mobility of the mitral valve leaflets. Trivial mitral valve regurgitation. No evidence of mitral valve stenosis. Tricuspid Valve: The tricuspid valve is normal in structure. Tricuspid valve regurgitation is mild . No evidence of tricuspid stenosis. Aortic Valve: The aortic valve is tricuspid. Aortic valve regurgitation is not visualized. No aortic stenosis is present. Pulmonic Valve: The pulmonic valve was normal in structure. Pulmonic valve regurgitation is trivial. No evidence of pulmonic stenosis. Aorta: The aortic root is normal in size and structure. Venous: The inferior vena cava is normal in size with greater than 50% respiratory variability, suggesting right atrial pressure of 3 mmHg. IAS/Shunts: No atrial level shunt detected by color flow Doppler. Additional Comments: Normal LV systolic function; grade 1 diastolic dysfunction; probable mobile plaque in aortic arch.  LEFT VENTRICLE PLAX 2D LVIDd:         4.27 cm  Diastology LVIDs:         2.70 cm  LV e' lateral:   6.96 cm/s LV PW:         1.11 cm  LV E/e' lateral: 8.5 LV IVS:        1.14 cm  LV e' medial:    5.55 cm/s LVOT diam:     2.30 cm  LV E/e' medial:   10.7 LV SV:         72 LV SV Index:   33 LVOT Area:     4.15 cm  RIGHT ATRIUM           Index RA Area:     17.80 cm RA Volume:   48.00 ml  21.96 ml/m  AORTIC VALVE LVOT Vmax:   97.90 cm/s LVOT Vmean:  66.800 cm/s LVOT VTI:    0.173 m  AORTA Ao Root diam: 3.60 cm MITRAL VALVE                TRICUSPID VALVE MV Area (PHT): 3.27 cm     TR Peak grad:   23.0 mmHg MV Decel Time: 232 msec     TR Vmax:        240.00 cm/s MV E velocity: 59.40 cm/s MV A velocity: 107.00 cm/s  SHUNTS MV E/A ratio:  0.56         Systemic VTI:  0.17 m                             Systemic Diam:  2.30 cm Kirk Ruths MD Electronically signed by Kirk Ruths MD Signature Date/Time: 04/13/2019/1:14:52 PM    Final    VAS [place] LOWER EXTREMITY VENOUS (DVT) ([facility] and [facility] 7a-7p)  Result Date: 04/12/2019  Lower Venous DVTStudy Indications: Pain, and Swelling.  Comparison Study: LEV 04-04-19, negative. Performing Technologist: Baldwin Crown ARDMS, RVT  Examination Guidelines: A complete evaluation includes B-mode imaging, spectral Doppler, color Doppler, and power Doppler as needed of all accessible portions of each vessel. Bilateral testing is considered an integral part of a complete examination. Limited examinations for reoccurring indications may be performed as noted. The reflux portion of the exam is performed with the patient in reverse Trendelenburg.  +-----+---------------+---------+-----------+----------+--------------+ RIGHTCompressibilityPhasicitySpontaneityPropertiesThrombus Aging +-----+---------------+---------+-----------+----------+--------------+ CFV  Full           Yes      Yes                                 +-----+---------------+---------+-----------+----------+--------------+   +---------+---------------+---------+-----------+----------+--------------+ LEFT     CompressibilityPhasicitySpontaneityPropertiesThrombus Aging +---------+---------------+---------+-----------+----------+--------------+ CFV       None           No       No                   Acute          +---------+---------------+---------+-----------+----------+--------------+ SFJ      None                                                        +---------+---------------+---------+-----------+----------+--------------+ FV Prox  None                                                        +---------+---------------+---------+-----------+----------+--------------+ FV Mid   None                                                        +---------+---------------+---------+-----------+----------+--------------+ FV DistalNone                                                        +---------+---------------+---------+-----------+----------+--------------+ PFV      None                                                        +---------+---------------+---------+-----------+----------+--------------+ POP      None           No       No                                  +---------+---------------+---------+-----------+----------+--------------+  PTV      None                                                        +---------+---------------+---------+-----------+----------+--------------+ PERO     None                                                        +---------+---------------+---------+-----------+----------+--------------+ Gastroc  None                                                        +---------+---------------+---------+-----------+----------+--------------+ EIV      None                                                        +---------+---------------+---------+-----------+----------+--------------+     Summary: RIGHT: - No evidence of common femoral vein obstruction.  LEFT: - Findings consistent with acute deep vein thrombosis involving the left common femoral vein, SF junction, left femoral vein, left proximal profunda vein, left popliteal vein, left posterior tibial veins, left  peroneal veins, left gastrocnemius veins, and EIV. - No cystic structure found in the popliteal fossa.  *See table(s) above for measurements and observations. Electronically signed by Monica Martinez MD on 04/12/2019 at 4:39:23 PM.    Final    VAS [place] LOWER EXTREMITY VENOUS (DVT)  Result Date: 04/04/2019  Lower Venous DVTStudy Indications: Elevated ddimer.  Comparison Study: no prior Performing Technologist: Abram Sander RVS  Examination Guidelines: A complete evaluation includes B-mode imaging, spectral Doppler, color Doppler, and power Doppler as needed of all accessible portions of each vessel. Bilateral testing is considered an integral part of a complete examination. Limited examinations for reoccurring indications may be performed as noted. The reflux portion of the exam is performed with the patient in reverse Trendelenburg.  +---------+---------------+---------+-----------+----------+--------------+ RIGHT    CompressibilityPhasicitySpontaneityPropertiesThrombus Aging +---------+---------------+---------+-----------+----------+--------------+ CFV      Full           Yes      Yes                                 +---------+---------------+---------+-----------+----------+--------------+ SFJ      Full                                                        +---------+---------------+---------+-----------+----------+--------------+ FV Prox  Full                                                        +---------+---------------+---------+-----------+----------+--------------+  FV Mid   Full                                                        +---------+---------------+---------+-----------+----------+--------------+ FV DistalFull                                                        +---------+---------------+---------+-----------+----------+--------------+ PFV      Full                                                         +---------+---------------+---------+-----------+----------+--------------+ POP      Full           Yes      Yes                                 +---------+---------------+---------+-----------+----------+--------------+ PTV      Full                                                        +---------+---------------+---------+-----------+----------+--------------+ PERO     Full                                                        +---------+---------------+---------+-----------+----------+--------------+   +---------+---------------+---------+-----------+----------+--------------+ LEFT     CompressibilityPhasicitySpontaneityPropertiesThrombus Aging +---------+---------------+---------+-----------+----------+--------------+ CFV      Full           Yes      Yes                                 +---------+---------------+---------+-----------+----------+--------------+ SFJ      Full                                                        +---------+---------------+---------+-----------+----------+--------------+ FV Prox  Full                                                        +---------+---------------+---------+-----------+----------+--------------+ FV Mid   Full                                                        +---------+---------------+---------+-----------+----------+--------------+  FV DistalFull                                                        +---------+---------------+---------+-----------+----------+--------------+ PFV      Full                                                        +---------+---------------+---------+-----------+----------+--------------+ POP      Full           Yes      Yes                                 +---------+---------------+---------+-----------+----------+--------------+ PTV      Full                                                         +---------+---------------+---------+-----------+----------+--------------+ PERO     Full                                                        +---------+---------------+---------+-----------+----------+--------------+     Summary: BILATERAL: - No evidence of deep vein thrombosis seen in the lower extremities, bilaterally.   *See table(s) above for measurements and observations. Electronically signed by Monica Martinez MD on 04/04/2019 at 7:02:03 PM.    Final     Micro Results     No results found for this or any previous visit (from the past 240 hour(s)).  Today   Subjective    Edward Mejia today has no headache,no chest abdominal pain,no new weakness tingling or numbness, feels much better wants to go home today.    Objective   Blood pressure 105/75, pulse 77, temperature 97.9 F (36.6 C), temperature source Oral, resp. rate 17, height 6' (1.829 m), weight 94.5 kg, SpO2 96 %.   Intake/Output Summary (Last 24 hours) at 04/16/2019 1138 Last data filed at 04/16/2019 1116 Gross per 24 hour  Intake 626.79 ml  Output 2085 ml  Net -1458.21 ml    Exam  Awake Alert, Oriented x 3, No new F.N deficits, Normal affect [place].AT,PERRAL Supple Neck,No JVD, No cervical lymphadenopathy appriciated.  Symmetrical Chest wall movement, Good air movement bilaterally, CTAB RRR,No Gallops,Rubs or new Murmurs, No Parasternal Heave +ve B.Sounds, Abd Soft, Non tender, No organomegaly appriciated, No rebound -guarding or rigidity. No Cyanosis, left leg swelling much improved under TED stockings   Data Review   CBC w Diff:  Lab Results  Component Value Date   WBC 18.5 (H) 04/16/2019   HGB 16.6 04/16/2019   HGB 18.6 (H) 02/22/2019   HCT 51.0 04/16/2019   HCT 53.9 (H) 02/22/2019   PLT 237 04/16/2019   PLT 435 02/22/2019   LYMPHOPCT 9 04/16/2019   MONOPCT 10 04/16/2019   EOSPCT 0 04/16/2019  BASOPCT 1 04/16/2019    CMP:  Lab Results  Component Value Date   NA 134 (L) 04/16/2019    NA 134 02/22/2019   K 4.3 04/16/2019   CL 95 (L) 04/16/2019   CO2 26 04/16/2019   BUN 18 04/16/2019   BUN 9 02/22/2019   CREATININE 1.05 04/16/2019   GLU 83 05/02/2014   PROT 5.0 (L) 04/16/2019   PROT 6.1 02/22/2019   ALBUMIN 2.4 (L) 04/16/2019   ALBUMIN 4.2 02/22/2019   BILITOT 1.5 (H) 04/16/2019   BILITOT 1.2 02/22/2019   ALKPHOS 47 04/16/2019   AST 26 04/16/2019   ALT 32 04/16/2019  .   Total Time in preparing paper work, data evaluation and todays exam - 35 minutes  Lala Lund M.D on 04/16/2019 at 11:38 AM  [facility]   Office  [phone]

## 2019-04-16 NOTE — Progress Notes (Signed)
Physical Therapy Treatment Patient Details Name: Tyvone Steinwand MRN: [id number] DOB: 05-15-1945 Today's Date: 04/16/2019    History of Present Illness 74 year old male with medical history significant of hypertension, hyperlipidemia, recently diagnosed with Covid pneumonia 2 weeks ago and was hospitalized from 04/03/2019 to 04/07/2019 at [facility] presented to emergency department due to left lower extremity swelling, shortness of breath and generalized weakness, work-up was consistent with bilateral large PEs, with large left lower extremity DVT, blood clots in IVC and left external iliac vein. Vascular consulted. Patient on anticoagulation. Discharge home planned for 04/16/19.     PT Comments    Patient required less assistance for mobility this session and tolerated ambulation in hospital room with use of RW. He is agreeable to use RW for mobility upon discharge home and agreeable to home PT. Patient denies having any concerns about his mobility for discharge and reports his son-in-law will assist him into the home.    Follow Up Recommendations  Home health PT;Supervision/Assistance - 24 hour     Equipment Recommendations  (pt already owns RW)       Precautions / Restrictions Precautions Precautions: Fall Restrictions Weight Bearing Restrictions: No    Mobility  Bed Mobility Overal bed mobility: Needs Assistance Bed Mobility: Supine to Sit     Supine to sit: Modified independent (Device/Increase time)     General bed mobility comments: HOB approx 10 degrees  Transfers Overall transfer level: Needs assistance Equipment used: Rolling walker (2 wheeled);None Transfers: Sit to/from Stand Sit to Stand: Min guard;Supervision         General transfer comment: Contact guard from EOB due to low bed height. Patient required more than one attempt to reach full stand. Supervision from recliner chair  Ambulation/Gait Ambulation/Gait assistance: Supervision Gait Distance (Feet): 20  Feet Assistive device: Rolling walker (2 wheeled) Gait Pattern/deviations: Decreased dorsiflexion - left;Decreased dorsiflexion - right;Decreased step length - right;Decreased step length - left Gait velocity: decreased   General Gait Details: Patient declined ambulating in hallway, agreeable to ambulate to door and back in room. Oxygen saturation stable on room air.    Stairs Stairs: Yes Stairs assistance: Min assist Stair Management: (R then L rail) Number of Stairs: 2 General stair comments: Demo with return demo of stair negotiation technique. BUEs on railing to negotiate first step, Use of L rail and HHA on R to negotiate 2nd step. Patient confirms his son-in-law will be there to assist him into the home.       Balance Overall balance assessment: Needs assistance Sitting-balance support: Feet supported Sitting balance-Leahy Scale: Good Sitting balance - Comments: Patient able to don socks sitting EOB independently.   Standing balance support: Bilateral upper extremity supported   Standing balance comment: Static standing balance with RW modI, dynamic standing balance with RW supervision.      Cognition Arousal/Alertness: Awake/alert         General Comments General comments (skin integrity, edema, etc.): On room air, oxygen stable. Rest breaks taken as needed during session.      Pertinent Vitals/Pain Pain Assessment: Faces Faces Pain Scale: Hurts little more Pain Location: RUE/trunk Pain Intervention(s): Limited activity within patient's tolerance;Monitored during session           PT Goals (current goals can now be found in the care plan section) Progress towards PT goals: Progressing toward goals    Frequency    Min 3X/week      PT Plan Current plan remains appropriate  AM-PAC PT "6 Clicks" Mobility   Outcome Measure  Help needed turning from your back to your side while in a flat bed without using bedrails?: None Help needed moving from  lying on your back to sitting on the side of a flat bed without using bedrails?: None Help needed moving to and from a bed to a chair (including a wheelchair)?: A Little Help needed standing up from a chair using your arms (e.g., wheelchair or bedside chair)?: A Little Help needed to walk in hospital room?: A Little Help needed climbing 3-5 steps with a railing? : A Little 6 Click Score: 20    End of Session   Activity Tolerance: Patient limited by fatigue Patient left: in chair;with call bell/phone within reach;with chair alarm set Nurse Communication: Mobility status PT Visit Diagnosis: Muscle weakness (generalized) (M62.81);Difficulty in walking, not elsewhere classified (R26.2);Pain     Time: [id number] PT Time Calculation (min) (ACUTE ONLY): 26 min  Charges:  $Gait Training: 23-37 mins                     Birdie Hopes, DPT, PT Acute Rehab [phone] office     Birdie Hopes 04/16/2019, 9:22 AM

## 2019-04-16 NOTE — Care Management Important Message (Signed)
Important Message  Patient Details  Name: Edward Mejia MRN: [id number] Date of Birth: Nov 26, 1945   Medicare Important Message Given:  Yes - Important Message mailed due to current National Emergency  Verbal consent obtained due to current National Emergency  Relationship to patient: Spouse/Significant Other Contact Name: Yakir Moffit Call Date: 04/16/19  Time: 1139 Phone: [phone] Outcome: Spoke with contact Important Message mailed to: Other (must enter comment)(declined additional copy of IM. Is aware one is available if needed)    Delorse Lek 04/16/2019, 11:39 AM

## 2019-04-16 NOTE — Consult Note (Signed)
   [facility] CM Inpatient Consult   04/16/2019  [name] December 23, 1945 [id number]   Patient screened for unplanned readmission less than 7 days re-hospitalizations to check if potential [facility] services are needed. Patient with [facility] noted.  Review of patient's medical record from MD history and Physical which includes but not limited to reveals patient is:   Edward Mejia is a 74 y.o. male with medical history significant of hypertension, hyperlipidemia, recently diagnosed with Covid pneumonia 2 weeks ago at  [facility] presented to emergency department on 04/12/2019 due to left lower extremity swelling, shortness of breath and generalized weakness.   Primary Care Provider is Lelon Huh, MD at [facility], this is a [facility] Embedded practice facility in which this provider does the Transition Of Care follow up.  Plan:  Continue to follow progress and disposition to assess for post hospital care management needs. Will notify the Embedded Care Management staff of patient for potential care management needs.   For questions contact:   Natividad Brood, RN BSN [facility] Liaison  [phone] business mobile phone Toll free office [phone]  Fax number: [phone] [email or web address]

## 2019-04-17 ENCOUNTER — Telehealth: Payer: Self-pay

## 2019-04-17 ENCOUNTER — Telehealth: Payer: Self-pay | Admitting: Family Medicine

## 2019-04-17 NOTE — Telephone Encounter (Signed)
HFU scheduled for 05/03/19 @ 3:00 PM with Dr Caryn Section.

## 2019-04-17 NOTE — Telephone Encounter (Signed)
Copied from [place] [id number]. Topic: General - Inquiry >> Apr 17, 2019  8:39 AM Mathis Bud wrote: Reason for CRM: Patient just got out of hospital 3/9.  Patient did have covid 21 days ago, patient did have to go back to hospital due to blood clots in the lung and his legs.  Patient needs a HFU.  Wife does not want to wait till the 26th.  Wife states patient is on blood thinners and needs to get more blood work done. Call back [phone]

## 2019-04-17 NOTE — Telephone Encounter (Signed)
i am supposed to have a 40 minute slot reserved at 10am mondays and 3 pm on Friday but they keep getting filled which i don't understand. On Friday the 19th, you can move the 3:20 appt to the 3:40 same day slot. move the 3pm to the 2:40 same day slot, and put the hospital follow up in for 40 minutes at 3pm.

## 2019-04-17 NOTE — Telephone Encounter (Signed)
Transition Care Management Follow-up Telephone Call  Date of discharge and from where: [facility] on 04/16/19  How have you been since you were released from the hospital? Feeling better, slept well last night and was able to eat breakfast this morning. Pt is still very weak. Declines pain, fever, SOB, cough or n/v/d.  Any questions or concerns? Yes, concerned about HFU apt. See other telephone encounter- awaiting response.   Items Reviewed:  Did the pt receive and understand the discharge instructions provided? Yes   Medications obtained and verified? No, but did verify new medication (Eliquis) and dose.  Any new allergies since your discharge? No   Dietary orders reviewed? Yes  Do you have support at home? Yes   Other (ie: DME, Home Health, etc) Order placed for [facility] for PT.  Functional Questionnaire: (I = Independent and D = Dependent)  Bathing/Dressing- I   Meal Prep- I  Eating- I  Maintaining continence- I  Transferring/Ambulation- I, using a walker currently due to weakness from virus.  Managing Meds- I   Follow up appointments reviewed:    PCP Hospital f/u appt confirmed? No, pt is awaiting a response on when he can be worked in for a [place] apt. See other telephone encounter.    [facility] f/u appt confirmed? Yes    Are transportation arrangements needed? No   If their condition worsens, is the pt aware to call  their PCP or go to the ED? Yes  Was the patient provided with contact information for the PCP's office or ED? Yes  Was the pt encouraged to call back with questions or concerns? Yes

## 2019-04-17 NOTE — Telephone Encounter (Signed)
Clair Gulling, from [place], called stating that the pt is declining services. Please advise.    [phone]

## 2019-04-17 NOTE — Telephone Encounter (Signed)
Patient rescheduled for 3/19

## 2019-04-17 NOTE — Telephone Encounter (Signed)
Copied from [place] [id number]. Topic: General - Inquiry >> Apr 17, 2019 11:44 AM Edward Mejia, NT wrote: Reason for CRM: PT with [facility] called to inform PCP that patient has requested a delay until 04/18/19. Please advise

## 2019-04-19 ENCOUNTER — Ambulatory Visit: Payer: Self-pay | Admitting: Family Medicine

## 2019-04-22 ENCOUNTER — Telehealth: Payer: Self-pay

## 2019-04-22 NOTE — Telephone Encounter (Signed)
Patient's wife advised that we do have a discount card for commercial insurance only. Wife states pharmacy did not enter the correct information at first now medication is only $37. They do not need the discount card now.

## 2019-04-22 NOTE — Telephone Encounter (Signed)
Copied from [place] [id number]. Topic: General - Other >> Apr 22, 2019 12:36 PM Rayann Heman wrote: Reason for CRM: pt daughter tammy called and would like to know if we have any discount cards for eliquis. Please advise

## 2019-04-25 ENCOUNTER — Other Ambulatory Visit: Payer: Self-pay

## 2019-04-26 ENCOUNTER — Ambulatory Visit (INDEPENDENT_AMBULATORY_CARE_PROVIDER_SITE_OTHER): Payer: Medicare Other | Admitting: Family Medicine

## 2019-04-26 ENCOUNTER — Encounter: Payer: Self-pay | Admitting: Family Medicine

## 2019-04-26 ENCOUNTER — Ambulatory Visit
Admission: RE | Admit: 2019-04-26 | Discharge: 2019-04-26 | Disposition: A | Discharge status: Home / Self Care | Payer: Medicare Other | Source: Ambulatory Visit | Attending: Family Medicine | Admitting: Family Medicine

## 2019-04-26 ENCOUNTER — Other Ambulatory Visit: Payer: Self-pay

## 2019-04-26 VITALS — BP 73/41 | HR 92 | Temp 96.8°F | Wt 203.0 lb

## 2019-04-26 DIAGNOSIS — I2699 Other pulmonary embolism without acute cor pulmonale: Secondary | ICD-10-CM

## 2019-04-26 DIAGNOSIS — Z8616 Personal history of COVID-19: Secondary | ICD-10-CM | POA: Diagnosis not present

## 2019-04-26 DIAGNOSIS — R0602 Shortness of breath: Secondary | ICD-10-CM | POA: Diagnosis not present

## 2019-04-26 DIAGNOSIS — I824Z2 Acute embolism and thrombosis of unspecified deep veins of left distal lower extremity: Secondary | ICD-10-CM | POA: Diagnosis not present

## 2019-04-26 DIAGNOSIS — I959 Hypotension, unspecified: Secondary | ICD-10-CM

## 2019-04-26 NOTE — Patient Instructions (Signed)
.   Please review the attached list of medications and notify my office if there are any errors.   . Please bring all of your medications to every appointment so we can make sure that our medication list is the same as yours.    Stop taking valsartan-hctz until you hear back from our office on Monday

## 2019-04-26 NOTE — Progress Notes (Signed)
Patient: Edward Mejia   DOB: [birth year]   74 y.o. Male  MRN: [id number] Visit Date: 04/26/2019  Today's Provider: Lelon Huh, MD  Subjective:    Chief Complaint  Patient presents with  . Hospitalization Follow-up   HPI  Follow up Hospitalization  Patient was admitted to [facility] on 3/5 and discharged on 3/9. He was treated for Bilateral Pulmonary Embolism secondary to Covid-19 Treatment for this included xrays, labs, EKG. Telephone follow up was done on 3/10 He reports satisfactory compliance with treatment. He reports this condition is Improved.  ------------------------------------------------------------------------------------  Patient is also very weak today. He restarted the valsartan-hctz 3 days ago and had been feeling well until yesterday and today. Denies any dyspnea or cough. Leg swelling has greatly improved.       Medications: Outpatient Medications Prior to Visit  Medication Sig Dispense Refill  . acetaminophen (TYLENOL) 500 MG tablet Take 1,000 mg by mouth 3 (three) times daily as needed for mild pain or headache.     . albuterol (PROVENTIL HFA) 108 (90 Base) MCG/ACT inhaler Inhale 2 puffs into the lungs every 6 (six) hours as needed for wheezing or shortness of breath.    Marland Kitchen apixaban (ELIQUIS) 5 MG TABS tablet Take 2 tablets (10 mg total) by mouth 2 (two) times daily for 6 days. 24 tablet 0  . apixaban (ELIQUIS) 5 MG TABS tablet Take 1 tablet (5 mg total) by mouth 2 (two) times daily. 60 tablet 0  . etanercept (ENBREL) 50 MG/ML injection Inject 50 mg into the skin every Thursday.     . gabapentin (NEURONTIN) 300 MG capsule Take 600 mg by mouth See admin instructions. Take 600 mg by mouth once daily at 4 PM    . HYDROcodone-acetaminophen (NORCO) 7.5-325 MG tablet Take 1 tablet by mouth 3 (three) times daily as needed for moderate pain. (Patient taking differently: Take 1 tablet by mouth every 6 (six) hours. ) 90 tablet 0  . hydrocortisone  (ANUSOL-HC) 25 MG suppository Place 1 suppository (25 mg total) rectally 2 (two) times daily. 12 suppository 0  . isosorbide mononitrate (IMDUR) 120 MG 24 hr tablet Take 120 mg by mouth daily.     . misoprostol (CYTOTEC) 100 MCG tablet Take 100 mcg by mouth daily with lunch.     . nitroGLYCERIN (NITROSTAT) 0.4 MG SL tablet Place 0.4 mg under the tongue every 5 (five) minutes as needed for chest pain.    . pantoprazole (PROTONIX) 40 MG tablet Take 1 tablet (40 mg total) by mouth 2 (two) times daily before a meal. 60 tablet 0  . predniSONE (DELTASONE) 5 MG tablet Take 7.5 mg by mouth daily with lunch.     . ranolazine (RANEXA) 500 MG 12 hr tablet Take 500 mg by mouth in the morning and at bedtime.    Marland Kitchen tiZANidine (ZANAFLEX) 2 MG tablet Take 2 mg by mouth at bedtime.    . valsartan-hydrochlorothiazide (DIOVAN-HCT) 160-12.5 MG tablet Take 1 tablet by mouth daily. 30 tablet 3   No facility-administered medications prior to visit.    Lab Results  Component Value Date   WBC 18.5 (H) 04/16/2019   HGB 16.6 04/16/2019   HCT 51.0 04/16/2019   MCV 85.4 04/16/2019   PLT 237 04/16/2019   Lab Results  Component Value Date   NA 134 (L) 04/16/2019   K 4.3 04/16/2019   CL 95 (L) 04/16/2019   CO2 26 04/16/2019   GLUCOSE 75 04/16/2019  BUN 18 04/16/2019   CREATININE 1.05 04/16/2019   CALCIUM 7.9 (L) 04/16/2019   GFRNONAA >60 04/16/2019   GFRAA >60 04/16/2019    Review of Systems  Constitutional: Negative for appetite change, chills and fever.  Respiratory: Negative for chest tightness, shortness of breath and wheezing.   Cardiovascular: Negative for chest pain and palpitations.  Gastrointestinal: Negative for abdominal pain, nausea and vomiting.      Objective:   BP (!) 73/41 (BP Location: Left Arm, Patient Position: Sitting, Cuff Size: Normal)   Pulse 92   Temp (!) 96.8 F (36 C) (Temporal)   Wt 203 lb (92.1 kg)   BMI 27.53 kg/m      Physical Exam    General: Appearance:       Well developed, well nourished male in no acute distress  Eyes:    PERRL, conjunctiva/corneas clear, EOM's intact       Lungs:     Clear to auscultation bilaterally, respirations unlabored  Heart:    Normal heart rate. Normal rhythm. No murmurs, rubs, or gallops.   MS:   All extremities are intact.   Neurologic:   Awake, alert, oriented x 3. No apparent focal neurological           defect.           Assessment & Plan:    1. Bilateral pulmonary embolism (HCC) Secondary to Covid-19. Tolerating NOAC with no active respiratory sx.  - Comprehensive metabolic panel - TSH - CBC - DG Chest 2 View; Future  2. Acute deep vein thrombosis (DVT) of distal vein of left lower extremity (HCC)   3. History of 2019 novel coronavirus disease (COVID-19)  4. Hypotension Has been back on valsartan-hctz for 3 days and feeling extremely fatigued since yesterday. Will stop medication over the weekend and consider restarting on lower dose at that time.     Lelon Huh, MD  [facility] [phone] (phone) [phone] (fax)  [place]

## 2019-04-27 LAB — CBC
Hematocrit: 49.3 % (ref 37.5–51.0)
Hemoglobin: 16.6 g/dL (ref 13.0–17.7)
MCH: 28.8 pg (ref 26.6–33.0)
MCHC: 33.7 g/dL (ref 31.5–35.7)
MCV: 85 fL (ref 79–97)
Platelets: 406 10*3/uL (ref 150–450)
RBC: 5.77 x10E6/uL (ref 4.14–5.80)
RDW: 17.2 % — ABNORMAL HIGH (ref 11.6–15.4)
WBC: 26.5 10*3/uL (ref 3.4–10.8)

## 2019-04-27 LAB — TSH: TSH: 5.73 u[IU]/mL — ABNORMAL HIGH (ref 0.450–4.500)

## 2019-04-27 LAB — COMPREHENSIVE METABOLIC PANEL
ALT: 17 IU/L (ref 0–44)
AST: 22 IU/L (ref 0–40)
Albumin/Globulin Ratio: 1.6 (ref 1.2–2.2)
Albumin: 3.5 g/dL — ABNORMAL LOW (ref 3.7–4.7)
Alkaline Phosphatase: 79 IU/L (ref 39–117)
BUN/Creatinine Ratio: 9 — ABNORMAL LOW (ref 10–24)
BUN: 10 mg/dL (ref 8–27)
Bilirubin Total: 1.3 mg/dL — ABNORMAL HIGH (ref 0.0–1.2)
CO2: 23 mmol/L (ref 20–29)
Calcium: 8.4 mg/dL — ABNORMAL LOW (ref 8.6–10.2)
Chloride: 95 mmol/L — ABNORMAL LOW (ref 96–106)
Creatinine, Ser: 1.17 mg/dL (ref 0.76–1.27)
GFR calc Af Amer: 71 mL/min/{1.73_m2} (ref >59)
GFR calc non Af Amer: 61 mL/min/{1.73_m2} (ref >59)
Globulin, Total: 2.2 g/dL (ref 1.5–4.5)
Glucose: 125 mg/dL — ABNORMAL HIGH (ref 65–99)
Potassium: 4.8 mmol/L (ref 3.5–5.2)
Sodium: 133 mmol/L — ABNORMAL LOW (ref 134–144)
Total Protein: 5.7 g/dL — ABNORMAL LOW (ref 6.0–8.5)

## 2019-04-29 ENCOUNTER — Telehealth: Payer: Self-pay

## 2019-04-29 ENCOUNTER — Other Ambulatory Visit: Payer: Self-pay | Admitting: Family Medicine

## 2019-04-29 NOTE — Telephone Encounter (Signed)
Patient's wife was advised of report.  States his blood pressure has been staying normal.  She is going to hold the medication.   At the end of the conversation she asked about the labs, gave her some of those readings.  Wanted to check about the TSH, did anything need to be done about that?

## 2019-04-29 NOTE — Telephone Encounter (Signed)
Thyroid functions can vary somewhat, his TSH was only borderline abnormal, probably because of all his recent illnesses. Not enough to require any medications. We will check it again in a few months to make sure it is not trending up.

## 2019-04-29 NOTE — Telephone Encounter (Signed)
-----   Message from Birdie Sons, MD sent at 04/29/2019  3:56 PM EDT ----- Edward Mejia blood cell count is high. Sodium level is stable.  Looks like new area of pneumonia is on xray. If he is feeling any worse, having any fever or shortness of breath then needs to go to ER. Otherwise need to start back on antibiotic. Start levofloxacin 750mg  daily x 7 days  He should probably keep his blood pressure medication on  hold unless he has been getting high blood pressure reading.

## 2019-04-30 ENCOUNTER — Telehealth: Payer: Self-pay

## 2019-04-30 MED ORDER — LEVOFLOXACIN 750 MG PO TABS: 750.0000 mg | ORAL_TABLET | Freq: Every day | ORAL | 0 refills | Status: DC

## 2019-04-30 NOTE — Addendum Note (Signed)
Addended by: Jules Schick on: 04/30/2019 03:57 PM   Modules accepted: Orders

## 2019-04-30 NOTE — Telephone Encounter (Signed)
Copied from [place] [id number]. Topic: Appointment Scheduling - Scheduling Inquiry for Clinic >> Apr 30, 2019 12:03 PM Sheran Luz wrote: Patient calling to schedule appointment with Dr. Caryn Section next week, per provider instruction at last visit. Nothing available for scheduling. Unable to override hold times.

## 2019-04-30 NOTE — Telephone Encounter (Signed)
There should be some slots available now. He needs follow up for hypotension and blateral pneumonia. Thanks.

## 2019-04-30 NOTE — Telephone Encounter (Signed)
Thanks.  I told the patient's wife we would call only if there was something that needed to be done at this time

## 2019-04-30 NOTE — Telephone Encounter (Signed)
Please review. I didn't find any documentation that stated patient should follow up in the office the following week after last office visit from 04/26/2019.

## 2019-04-30 NOTE — Telephone Encounter (Signed)
Patient scheduled for a follow up on 05/08/2019

## 2019-05-03 ENCOUNTER — Inpatient Hospital Stay: Payer: Medicare Other | Admitting: Family Medicine

## 2019-05-03 ENCOUNTER — Telehealth (HOSPITAL_COMMUNITY): Payer: Self-pay

## 2019-05-03 NOTE — Telephone Encounter (Signed)

## 2019-05-07 ENCOUNTER — Other Ambulatory Visit: Payer: Self-pay

## 2019-05-07 ENCOUNTER — Ambulatory Visit (INDEPENDENT_AMBULATORY_CARE_PROVIDER_SITE_OTHER): Payer: Medicare Other | Admitting: Vascular Surgery

## 2019-05-07 ENCOUNTER — Encounter: Payer: Self-pay | Admitting: Vascular Surgery

## 2019-05-07 VITALS — BP 148/84 | HR 84 | Temp 97.3°F | Resp 18 | Ht 72.0 in | Wt 207.0 lb

## 2019-05-07 DIAGNOSIS — I824Z2 Acute embolism and thrombosis of unspecified deep veins of left distal lower extremity: Secondary | ICD-10-CM

## 2019-05-07 NOTE — Progress Notes (Signed)
Established patient visit      Patient: Edward Mejia   DOB: Oct 27, 1945   74 y.o. Male  MRN: [id number] Visit Date: 05/08/2019  Today's healthcare provider: Lelon Huh, MD  Subjective:    Chief Complaint  Patient presents with  . Follow-up  . Pneumonia  . Hypotension   HPI  Follow up for Bilateral Pneumonia:  The patient was last seen for this 10 days ago. Changes made at last visit include labs and CXR. Patient was started on levofloxacin 750mg  daily x 7 days  He reports good compliance with treatment. He feels that condition is Improved. He feels like he really started turning around about 4 days ago. He still feels a little weak and foggy, but appetite is much better, strength is better and having no trouble with breathing.  He is not having side effects.   He had been admitted in February for Covid pneumonia with bilateral PE and left DVT.  ------------------------------------------------------------------------------------   Follow up for Hypotension:  The patient was last seen for this 10 days ago. Changes made at last visit include advised to stop Valsartan-HCTZ over the weekend due to feeling extremely fatigue.   He reports good compliance with treatment. He feels that condition is Improved.  Home Bps were in the low 100s until the last few days when they starting coming up into the 130s and 140s.  He is not having side effects.   ------------------------------------------------------------------------------------       Medications: Outpatient Medications Prior to Visit  Medication Sig  . acetaminophen (TYLENOL) 500 MG tablet Take 1,000 mg by mouth 3 (three) times daily as needed for mild pain or headache.   . albuterol (PROVENTIL HFA) 108 (90 Base) MCG/ACT inhaler Inhale 2 puffs into the lungs every 6 (six) hours as needed for wheezing or shortness of breath.  Marland Kitchen apixaban (ELIQUIS) 5 MG TABS tablet Take 1 tablet (5 mg total) by mouth 2 (two)  times daily.  Marland Kitchen etanercept (ENBREL) 50 MG/ML injection Inject 50 mg into the skin every Thursday.   . gabapentin (NEURONTIN) 300 MG capsule Take 600 mg by mouth See admin instructions. Take 600 mg by mouth once daily at 4 PM  . HYDROcodone-acetaminophen (NORCO) 7.5-325 MG tablet Take 1 tablet by mouth 3 (three) times daily as needed for moderate pain. (Patient taking differently: Take 1 tablet by mouth every 6 (six) hours. )  . isosorbide mononitrate (IMDUR) 120 MG 24 hr tablet Take 120 mg by mouth daily.   . misoprostol (CYTOTEC) 100 MCG tablet Take 100 mcg by mouth daily with lunch.   . nitroGLYCERIN (NITROSTAT) 0.4 MG SL tablet Place 0.4 mg under the tongue every 5 (five) minutes as needed for chest pain.  . pantoprazole (PROTONIX) 40 MG tablet Take 1 tablet (40 mg total) by mouth 2 (two) times daily before a meal.  . predniSONE (DELTASONE) 5 MG tablet Take 7.5 mg by mouth daily with lunch.   . ranolazine (RANEXA) 500 MG 12 hr tablet Take 500 mg by mouth in the morning and at bedtime.  Marland Kitchen tiZANidine (ZANAFLEX) 2 MG tablet Take 2 mg by mouth at bedtime.  . hydrocortisone (ANUSOL-HC) 25 MG suppository Place 1 suppository (25 mg total) rectally 2 (two) times daily.  . valsartan-hydrochlorothiazide (DIOVAN-HCT) 160-12.5 MG tablet Take 1 tablet by mouth daily. (Patient not taking: Reported on 05/08/2019)  . [DISCONTINUED] apixaban (ELIQUIS) 5 MG TABS tablet Take 2 tablets (10 mg total) by mouth 2 (two) times daily for  6 days.  . [DISCONTINUED] levofloxacin (LEVAQUIN) 750 MG tablet Take 1 tablet (750 mg total) by mouth daily. (Patient not taking: Reported on 05/07/2019)   No facility-administered medications prior to visit.    Review of Systems  Constitutional: Negative.  Negative for appetite change, chills and fever.  Respiratory: Negative.  Negative for chest tightness, shortness of breath and wheezing.   Cardiovascular: Negative.  Negative for chest pain and palpitations.  Gastrointestinal:  Negative for abdominal pain, nausea and vomiting.  Musculoskeletal: Negative.     Last CBC Lab Results  Component Value Date   WBC 26.5 (HH) 04/26/2019   HGB 16.6 04/26/2019   HCT 49.3 04/26/2019   MCV 85 04/26/2019   MCH 28.8 04/26/2019   RDW 17.2 (H) 04/26/2019   PLT 406 [id number]   Last metabolic panel Lab Results  Component Value Date   GLUCOSE 125 (H) 04/26/2019   NA 133 (L) 04/26/2019   K 4.8 04/26/2019   CL 95 (L) 04/26/2019   CO2 23 04/26/2019   BUN 10 04/26/2019   CREATININE 1.17 04/26/2019   GFRNONAA 61 04/26/2019   GFRAA 71 04/26/2019   CALCIUM 8.4 (L) 04/26/2019   PHOS 2.1 (L) 04/16/2019   PROT 5.7 (L) 04/26/2019   ALBUMIN 3.5 (L) 04/26/2019   LABGLOB 2.2 04/26/2019   AGRATIO 1.6 04/26/2019   BILITOT 1.3 (H) 04/26/2019   ALKPHOS 79 04/26/2019   AST 22 04/26/2019   ALT 17 04/26/2019   ANIONGAP 13 04/16/2019        Objective:    BP (!) 156/81 (BP Location: Right Arm, Cuff Size: Normal)   Pulse 78   Temp (!) 96.8 F (36 C) (Temporal)   Resp 18   Wt 215 lb (97.5 kg)   SpO2 96% Comment: room air  BMI 29.16 kg/m  BP Readings from Last 3 Encounters:  05/08/19 (!) 156/81  05/07/19 (!) 148/84  04/26/19 (!) 73/41      Physical Exam    General: Appearance:     Overweight male in no acute distress  Eyes:    PERRL, conjunctiva/corneas clear, EOM's intact       Lungs:     Clear to auscultation bilaterally, respirations unlabored  Heart:    Normal heart rate. Normal rhythm. No murmurs, rubs, or gallops.   MS:   All extremities are intact.   Neurologic:   Awake, alert, oriented x 3. No apparent focal neurological           defect.        No results found for any visits on 05/08/19.    Assessment & Plan:    1. Pneumonia of right lung due to infectious organism, unspecified part of lung New on CXR of 04/26/2019. Symptomatically is greatly improved since finishing 7 day course of levofloxacin.   2. Hypotension, unspecified hypotension  type Likely secondary to pneumonia, is now much better  3. Benign essential HTN Is to resume valsartan-hctz, but at lower dose of 80-12.5. he report he still has a few weeks of this from previous prescription. Will contact patient in about 2 weeks and see if he need to go back up to the 160s.     The entirety of the information documented in the History of Present Illness, Review of Systems and Physical Exam were personally obtained by me. Portions of this information were initially documented by Idelle Jo, CMA and reviewed by me for thoroughness and accuracy.    Lelon Huh, MD  [facility]  [phone] (phone) [phone] (fax)  [place]

## 2019-05-07 NOTE — Progress Notes (Signed)
Patient name: Edward Mejia MRN: [id number] DOB: 1945/03/25 Sex: male  REASON FOR VISIT: Hospital follow-up to discuss extensive left leg DVT  HPI: Edward Mejia is a 74 y.o. male who presents for hospital follow-up to discuss extensive left leg DVT.  Patient was initially seen in the emergency department with acute thrombus in IVC as well as the left iliac vein and left lower extremity on 04/12/2019.  This was in the setting of recent COVID-19 diagnosis on 03/27/2019.  Ultimately we elected to treat him conservatively with anticoagulation as well as compression and leg elevation due to extensive bilateral PEs and the fact that his leg was not very symptomatic.  Since discharge from the hospital he states he is having no pain in his left leg and the swelling is minimal and being managed with compression and leg elevation.  Due to severe psoriatic arthritis he has minimal ambulation states he does not get around much.  Past Medical History:  Diagnosis Date  . Arthritis   . Cancer ([place])    skin cancer  . Cellulitis   . Cellulitis   . H/O adenomatous polyp of colon 03/24/2015  . History of brain disorder: history of amaurosis fugax 03/24/2015  . History of pneumonia 04/04/2008  . History of rheumatic fever 03/24/2015   DID Have Rheumatic Fever.    . Hypertension   . Peptic ulcer disease   . Sepsis ([place]) 04/12/2019    Past Surgical History:  Procedure Laterality Date  . APPENDECTOMY    . LEFT HEART CATH AND CORONARY ANGIOGRAPHY Left 02/06/2018   Procedure: LEFT HEART CATH AND CORONARY ANGIOGRAPHY;  Surgeon: Teodoro Spray, MD;  Location: [facility];  Service: Cardiovascular;  Laterality: Left;  Marland Kitchen MANDIBLE FRACTURE SURGERY     fractured jaw  . PARTIAL GASTRECTOMY     peptic ulcer disease    Family History  Problem Relation Age of Onset  . Cancer Mother        lung cancer  . Heart disease Father 90       heart attack  . Cancer Brother        pancreatic cancer  .  Cancer Brother        pancreatic cancer    SOCIAL HISTORY: Social History   Tobacco Use  . Smoking status: Former Research scientist (life sciences)  . Smokeless tobacco: Never Used  . Tobacco comment: quit over 40+ years ago  Substance Use Topics  . Alcohol use: No    Alcohol/week: 0.0 standard drinks    Allergies  Allergen Reactions  . Pravachol [Pravastatin] Other (See Comments)    Caused FATIGUE    Current Outpatient Medications  Medication Sig Dispense Refill  . acetaminophen (TYLENOL) 500 MG tablet Take 1,000 mg by mouth 3 (three) times daily as needed for mild pain or headache.     . albuterol (PROVENTIL HFA) 108 (90 Base) MCG/ACT inhaler Inhale 2 puffs into the lungs every 6 (six) hours as needed for wheezing or shortness of breath.    Marland Kitchen apixaban (ELIQUIS) 5 MG TABS tablet Take 1 tablet (5 mg total) by mouth 2 (two) times daily. 60 tablet 0  . etanercept (ENBREL) 50 MG/ML injection Inject 50 mg into the skin every Thursday.     . gabapentin (NEURONTIN) 300 MG capsule Take 600 mg by mouth See admin instructions. Take 600 mg by mouth once daily at 4 PM    . HYDROcodone-acetaminophen (NORCO) 7.5-325 MG tablet Take 1 tablet by mouth 3 (  three) times daily as needed for moderate pain. (Patient taking differently: Take 1 tablet by mouth every 6 (six) hours. ) 90 tablet 0  . isosorbide mononitrate (IMDUR) 120 MG 24 hr tablet Take 120 mg by mouth daily.     . misoprostol (CYTOTEC) 100 MCG tablet Take 100 mcg by mouth daily with lunch.     . nitroGLYCERIN (NITROSTAT) 0.4 MG SL tablet Place 0.4 mg under the tongue every 5 (five) minutes as needed for chest pain.    . predniSONE (DELTASONE) 5 MG tablet Take 7.5 mg by mouth daily with lunch.     . ranolazine (RANEXA) 500 MG 12 hr tablet Take 500 mg by mouth in the morning and at bedtime.    Marland Kitchen tiZANidine (ZANAFLEX) 2 MG tablet Take 2 mg by mouth at bedtime.    . valsartan-hydrochlorothiazide (DIOVAN-HCT) 160-12.5 MG tablet Take 1 tablet by mouth daily. 30 tablet 3   . apixaban (ELIQUIS) 5 MG TABS tablet Take 2 tablets (10 mg total) by mouth 2 (two) times daily for 6 days. 24 tablet 0  . hydrocortisone (ANUSOL-HC) 25 MG suppository Place 1 suppository (25 mg total) rectally 2 (two) times daily. (Patient not taking: Reported on 05/07/2019) 12 suppository 0  . levofloxacin (LEVAQUIN) 750 MG tablet Take 1 tablet (750 mg total) by mouth daily. (Patient not taking: Reported on 05/07/2019) 7 tablet 0  . pantoprazole (PROTONIX) 40 MG tablet Take 1 tablet (40 mg total) by mouth 2 (two) times daily before a meal. (Patient not taking: Reported on 05/07/2019) 60 tablet 0   No current facility-administered medications for this visit.    REVIEW OF SYSTEMS:  [X]  denotes positive finding, [ ]  denotes negative finding Cardiac  Comments:  Chest pain or chest pressure:    Shortness of breath upon exertion:    Short of breath when lying flat:    Irregular heart rhythm:        Vascular    Pain in calf, thigh, or hip brought on by ambulation:    Pain in feet at night that wakes you up from your sleep:     Blood clot in your veins:    Leg swelling:  x left      Pulmonary    Oxygen at home:    Productive cough:     Wheezing:         Neurologic    Sudden weakness in arms or legs:     Sudden numbness in arms or legs:     Sudden onset of difficulty speaking or slurred speech:    Temporary loss of vision in one eye:     Problems with dizziness:         Gastrointestinal    Blood in stool:     Vomited blood:         Genitourinary    Burning when urinating:     Blood in urine:        Psychiatric    Major depression:         Hematologic    Bleeding problems:    Problems with blood clotting too easily:        Skin    Rashes or ulcers:        Constitutional    Fever or chills:      PHYSICAL EXAM: Vitals:   05/07/19 0819  BP: (!) 148/84  Pulse: 84  Resp: 18  Temp: (!) 97.3 F (36.3 C)  TempSrc: Temporal  SpO2: 97%  Weight: 207 lb (93.9 kg)    Height: 6' (1.829 m)    GENERAL: The patient is a well-nourished male, in no acute distress. The vital signs are documented above. CARDIAC: There is a regular rate and rhythm.  VASCULAR:  Palpable femoral pulses bilaterally Palpable DP pulses bilaterally No lower extremity phlegmasia Discoloration to feet that wife states is chronic PULMONARY: There is good air exchange bilaterally without wheezing or rales. ABDOMEN: Soft and non-tender with normal pitched bowel sounds.  MUSCULOSKELETAL: There are no major deformities or cyanosis. NEUROLOGIC: No focal weakness or paresthesias are detected.   DATA:   No new imaging  Assessment/Plan:  74 year old male that was seen with extensive acute left lower extremity DVT including iliac and IVC involvement on 04/12/2019 in the setting of recent COVID-19 diagnosis.  Ultimately on follow-up today his leg is doing well and is not having any significant swelling or pain.  I discussed with him and his wife the option of percutaneous mechanical thrombectomy to alleviate post thrombotic syndrome in the future.  After discussing procedure in detail we agreed that given he is not having any significant symptoms we will plan to treat him conservatively with anticoagulation leg elevation and compression.  I suspect his DVT is provoked in the setting of COVID-19 and he has had no other thromboembolic events in the past.  Discussed the current chest guidelines are 3 to 6 months of anticoagulation and given the extent of his DVT would favor 6 months.  He can follow-up as needed if anything changes in the future and we would be happy to reevaluate him.   Marty Heck, MD [facility] Office: [phone]

## 2019-05-08 ENCOUNTER — Ambulatory Visit (INDEPENDENT_AMBULATORY_CARE_PROVIDER_SITE_OTHER): Payer: Medicare Other | Admitting: Family Medicine

## 2019-05-08 ENCOUNTER — Encounter: Payer: Self-pay | Admitting: Family Medicine

## 2019-05-08 VITALS — BP 156/81 | HR 78 | Temp 96.8°F | Resp 18 | Wt 215.0 lb

## 2019-05-08 DIAGNOSIS — I959 Hypotension, unspecified: Secondary | ICD-10-CM

## 2019-05-08 DIAGNOSIS — I10 Essential (primary) hypertension: Secondary | ICD-10-CM | POA: Diagnosis not present

## 2019-05-08 DIAGNOSIS — J189 Pneumonia, unspecified organism: Secondary | ICD-10-CM

## 2019-05-08 NOTE — Patient Instructions (Signed)
.   Please review the attached list of medications and notify my office if there are any errors.   . Start back on valsartan-hctz 80-12.5mg  tablets one every day for now and keep track of your home blood pressure.    We'll call you in about 2 weeks to see how your BP is doing and if you need to go back up on the valsartan-hctz

## 2019-05-20 ENCOUNTER — Other Ambulatory Visit: Payer: Self-pay | Admitting: Family Medicine

## 2019-05-20 MED ORDER — APIXABAN 5 MG PO TABS: 5.0000 mg | ORAL_TABLET | Freq: Two times a day (BID) | ORAL | 4 refills | Status: DC

## 2019-05-20 NOTE — Telephone Encounter (Signed)
Pt's spouse came by office to let Dr. Caryn Section know her husband has only 2 days left of the  apixaban (ELIQUIS) 5 MG TABS tablet  Please call into:  [facility] [id number], [place] Phone:  [phone]  Fax:  [phone]       Please advise.  Thanks, [employer]

## 2019-05-20 NOTE — Addendum Note (Signed)
Addended by: Birdie Sons on: 05/20/2019 04:43 PM   Modules accepted: Orders

## 2019-05-20 NOTE — Telephone Encounter (Signed)
Please review refill request. This medication is prescribed by another provider (Dr. Candiss Norse). I called patient's wife Edward Mejia, and advised her of this. Edward Mejia states that she was told to contact patients PCP and we should be able to refill medication since everything looked good at the last outpatient appointment.

## 2019-05-23 DIAGNOSIS — K5900 Constipation, unspecified: Secondary | ICD-10-CM | POA: Diagnosis not present

## 2019-05-23 DIAGNOSIS — K649 Unspecified hemorrhoids: Secondary | ICD-10-CM | POA: Diagnosis not present

## 2019-05-30 ENCOUNTER — Ambulatory Visit
Admission: RE | Admit: 2019-05-30 | Discharge: 2019-05-30 | Disposition: A | Discharge status: Home / Self Care | Payer: Medicare Other | Source: Ambulatory Visit | Attending: Family Medicine | Admitting: Family Medicine

## 2019-05-30 ENCOUNTER — Ambulatory Visit: Payer: Self-pay

## 2019-05-30 ENCOUNTER — Other Ambulatory Visit: Payer: Self-pay

## 2019-05-30 ENCOUNTER — Ambulatory Visit (INDEPENDENT_AMBULATORY_CARE_PROVIDER_SITE_OTHER): Payer: Medicare Other | Admitting: Family Medicine

## 2019-05-30 VITALS — BP 128/76 | HR 90 | Temp 96.8°F | Wt 206.0 lb

## 2019-05-30 DIAGNOSIS — I2699 Other pulmonary embolism without acute cor pulmonale: Secondary | ICD-10-CM

## 2019-05-30 DIAGNOSIS — Z8616 Personal history of COVID-19: Secondary | ICD-10-CM | POA: Diagnosis not present

## 2019-05-30 DIAGNOSIS — I824Z2 Acute embolism and thrombosis of unspecified deep veins of left distal lower extremity: Secondary | ICD-10-CM

## 2019-05-30 DIAGNOSIS — K120 Recurrent oral aphthae: Secondary | ICD-10-CM | POA: Diagnosis not present

## 2019-05-30 DIAGNOSIS — I82412 Acute embolism and thrombosis of left femoral vein: Secondary | ICD-10-CM | POA: Diagnosis not present

## 2019-05-30 MED ORDER — MAGIC MOUTHWASH W/LIDOCAINE: 5.0000 mL | Freq: Four times a day (QID) | ORAL | 0 refills | Status: DC | PRN

## 2019-05-30 NOTE — Progress Notes (Signed)
I,Laura E Walsh,acting as a scribe for Lavon Paganini, MD.,have documented all relevant documentation on the behalf of Lavon Paganini, MD,as directed by  Lavon Paganini, MD while in the presence of Lavon Paganini, MD.   Established patient visit   Patient: Edward Mejia   DOB: 06/12/1945   74 y.o. Male  MRN: [id number] Visit Date: 05/30/2019  Today's healthcare provider: Lavon Paganini, MD   Chief Complaint  Patient presents with  . Edema    Left leg   Subjective    HPI Pt comes in today complaining of increased swelling in his lower left leg.  He has a history of a left leg DVT secondary to covid-19 infection.  Pt reports when he takes off his compression sock he notices a large "knot" on his lower left leg.  He says it lasts about 15 minutes and goes back down.  Pt says it is mildly tender to touch.   Social History   Tobacco Use  . Smoking status: Former Research scientist (life sciences)  . Smokeless tobacco: Never Used  . Tobacco comment: quit over 40+ years ago  Substance Use Topics  . Alcohol use: No    Alcohol/week: 0.0 standard drinks  . Drug use: No       Medications: Outpatient Medications Prior to Visit  Medication Sig  . acetaminophen (TYLENOL) 500 MG tablet Take 1,000 mg by mouth 3 (three) times daily as needed for mild pain or headache.   . albuterol (PROVENTIL HFA) 108 (90 Base) MCG/ACT inhaler Inhale 2 puffs into the lungs every 6 (six) hours as needed for wheezing or shortness of breath.  Marland Kitchen apixaban (ELIQUIS) 5 MG TABS tablet Take 1 tablet (5 mg total) by mouth 2 (two) times daily.  Marland Kitchen etanercept (ENBREL) 50 MG/ML injection Inject 50 mg into the skin every Thursday.   . gabapentin (NEURONTIN) 300 MG capsule Take 600 mg by mouth See admin instructions. Take 600 mg by mouth once daily at 4 PM  . HYDROcodone-acetaminophen (NORCO) 7.5-325 MG tablet Take 1 tablet by mouth 3 (three) times daily as needed for moderate pain. (Patient taking differently: Take 1 tablet by  mouth every 6 (six) hours. )  . isosorbide mononitrate (IMDUR) 120 MG 24 hr tablet Take 120 mg by mouth daily.   . misoprostol (CYTOTEC) 100 MCG tablet Take 100 mcg by mouth daily with lunch.   . nitroGLYCERIN (NITROSTAT) 0.4 MG SL tablet Place 0.4 mg under the tongue every 5 (five) minutes as needed for chest pain.  . predniSONE (DELTASONE) 5 MG tablet Take 7.5 mg by mouth daily with lunch.   . ranolazine (RANEXA) 500 MG 12 hr tablet Take 500 mg by mouth in the morning and at bedtime.  Marland Kitchen tiZANidine (ZANAFLEX) 2 MG tablet Take 2 mg by mouth at bedtime.  . valsartan-hydrochlorothiazide (DIOVAN-HCT) 160-12.5 MG tablet Take 1 tablet by mouth daily.  . [DISCONTINUED] hydrocortisone (ANUSOL-HC) 25 MG suppository Place 1 suppository (25 mg total) rectally 2 (two) times daily.  . [DISCONTINUED] pantoprazole (PROTONIX) 40 MG tablet Take 1 tablet (40 mg total) by mouth 2 (two) times daily before a meal.   No facility-administered medications prior to visit.    Review of Systems  Constitutional: Negative.   HENT: Positive for mouth sores. Negative for sore throat.   Respiratory: Positive for shortness of breath. Negative for apnea, cough, choking, chest tightness, wheezing and stridor.   Cardiovascular: Positive for leg swelling.  Gastrointestinal: Positive for constipation. Negative for abdominal distention, abdominal pain, anal  bleeding, blood in stool, diarrhea, nausea, rectal pain and vomiting.       Objective    BP 128/76 (BP Location: Left Arm, Patient Position: Sitting, Cuff Size: Large)   Pulse 90   Temp (!) 96.8 F (36 C) (Temporal)   Wt 206 lb (93.4 kg)   SpO2 95%   BMI 27.94 kg/m    Physical Exam Constitutional:      General: He is not in acute distress.    Appearance: Normal appearance. He is not diaphoretic.  HENT:     Mouth/Throat:     Mouth: Mucous membranes are moist. Oral lesions present.     Dentition: Has dentures.  Eyes:     Conjunctiva/sclera: Conjunctivae  normal.  Cardiovascular:     Rate and Rhythm: Normal rate and regular rhythm.     Pulses: Normal pulses.  Pulmonary:     Effort: Pulmonary effort is normal. No respiratory distress.     Breath sounds: Normal breath sounds.  Abdominal:     General: There is no distension.     Palpations: Abdomen is soft.     Tenderness: There is no abdominal tenderness.  Musculoskeletal:        General: Tenderness: mild tenderness in lower left leg.     Right lower leg: No edema.     Left lower leg: Edema present.     Comments: Palpable cord in lateral L calf  Skin:    General: Skin is warm and dry.     Findings: No rash.  Neurological:     Mental Status: He is alert and oriented to person, place, and time. Mental status is at baseline.  Psychiatric:        Mood and Affect: Mood normal.        Behavior: Behavior normal.        Thought Content: Thought content normal.        Judgment: Judgment normal.      No results found for any visits on 05/30/19.  Assessment & Plan     Problem List Items Addressed This Visit      Cardiovascular and Mediastinum   Bilateral pulmonary embolism ([place])    In setting of COVID19 Started anticoag 04/12/19 Continue Eliquis Breathing comfortably today      Left leg DVT ([place]) - Primary    Concerned for superficial clot.  Known extensive LLE DVT Will obtain STAT U/S If has had progression, may need to consider alternative anticoag      Relevant Orders   VAS [place] LOWER EXTREMITY VENOUS (DVT)   US Venous Img Lower Unilateral Left     Other   History of 2019 novel coronavirus disease ([id number])    Complicated by HCAP, DVT, bilateral PE       Other Visit Diagnoses    Aphthous ulcer of mouth           Return if symptoms worsen or fail to improve.      I, Lavon Paganini, MD, have reviewed all documentation for this visit. The documentation on 05/30/19 for the exam, diagnosis, procedures, and orders are all accurate and complete.   Helix Lafontaine,  Dionne Bucy, MD, MPH [facility]

## 2019-05-30 NOTE — Assessment & Plan Note (Signed)
In setting of COVID19 Started anticoag 04/12/19 Continue Eliquis Breathing comfortably today

## 2019-05-30 NOTE — Assessment & Plan Note (Signed)
Complicated by HCAP, DVT, bilateral PE

## 2019-05-30 NOTE — Telephone Encounter (Addendum)
Pt.'s wife reports pt. 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 This year. Wears compression hose. Has noticed x 2 days that he has developed a knot on the outer leg. Mild swelling , mild pain. Is on blood thinner. No chest pain or shortness of breath.Wife also states he "has white patches on the back of his throat since March. No sore throat." Request an appointment. Spoke with Jiles Garter in the practice and appointment scheduled.  Reason for Disposition . [1] MODERATE leg swelling (e.g., swelling extends up to knees) AND [2] new onset or worsening  Answer Assessment - Initial Assessment Questions 1. ONSET: "When did the swelling start?" (e.g., minutes, hours, days)     Started 2 days ago 2. LOCATION: "What part of the leg is swollen?"  "Are both legs swollen or just one leg?"     Left leg 3. SEVERITY: "How bad is the swelling?" (e.g., localized; mild, moderate, severe)  - Localized - small area of swelling localized to one leg  - MILD pedal edema - swelling limited to foot and ankle, pitting edema < 1/4 inch (6 mm) deep, rest and elevation eliminate most or all swelling  - MODERATE edema - swelling of lower leg to knee, pitting edema > 1/4 inch (6 mm) deep, rest and elevation only partially reduce swelling  - SEVERE edema - swelling extends above knee, facial or hand swelling present      Localized knot 4. REDNESS: "Does the swelling look red or infected?"     No 5. PAIN: "Is the swelling painful to touch?" If so, ask: "How painful is it?"   (Scale 1-10; mild, moderate or severe)     Mild 6. FEVER: "Do you have a fever?" If so, ask: "What is it, how was it measured, and when did it start?"      No 7. CAUSE: "What do you think is causing the leg swelling?"     Blood clot 8. MEDICAL HISTORY: "Do you have a history of heart failure, kidney disease, liver failure, or cancer?"     Blood clots 9. RECURRENT SYMPTOM: "Have you had leg swelling before?" If so, ask: "When was the last time?"  "What happened that time?"     Clots 10. OTHER SYMPTOMS: "Do you have any other symptoms?" (e.g., chest pain, difficulty breathing)       No 11. PREGNANCY: "Is there any chance you are pregnant?" "When was your last menstrual period?"       n/a  Protocols used: LEG SWELLING AND EDEMA-A-AH

## 2019-05-30 NOTE — Assessment & Plan Note (Addendum)
Concerned for superficial clot.  Known extensive LLE DVT Will obtain STAT U/S If has had progression, may need to consider alternative anticoag

## 2019-06-06 ENCOUNTER — Encounter: Payer: Self-pay | Admitting: Family Medicine

## 2019-06-06 NOTE — Telephone Encounter (Signed)
If not better in 1 wk or worsening, recommend f/u.  Continue mouthwash currently

## 2019-06-07 ENCOUNTER — Other Ambulatory Visit: Payer: Self-pay

## 2019-06-07 ENCOUNTER — Ambulatory Visit (INDEPENDENT_AMBULATORY_CARE_PROVIDER_SITE_OTHER): Payer: Medicare Other | Admitting: Family Medicine

## 2019-06-07 ENCOUNTER — Encounter: Payer: Self-pay | Admitting: Family Medicine

## 2019-06-07 VITALS — BP 146/81 | HR 71 | Temp 97.3°F | Resp 16 | Ht 72.0 in | Wt 202.6 lb

## 2019-06-07 DIAGNOSIS — B002 Herpesviral gingivostomatitis and pharyngotonsillitis: Secondary | ICD-10-CM

## 2019-06-07 MED ORDER — VALACYCLOVIR HCL 1 G PO TABS: 1000.0000 mg | ORAL_TABLET | Freq: Two times a day (BID) | ORAL | 0 refills | Status: AC

## 2019-06-07 NOTE — Patient Instructions (Signed)
Cold Sore  A cold sore, also called a fever blister, is a small, fluid-filled sore that forms inside the mouth or on the lips, gums, nose, chin, or cheeks. Cold sores can spread to other parts of the body, such as the eyes or fingers. In some people who have other medical conditions, cold sores can spread to multiple other body sites, including the genitals. Cold sores can spread from person to person (are contagious) until the sores crust over completely. Most cold sores go away within 2 weeks. What are the causes? Cold sores are caused by an infection from a common type of herpes simplex virus (HSV-1). HSV-1 is closely related to the HSV-2virus, which is the virus that causes genital herpes, but these viruses are not the same. Once a person is infected with HSV-1, the virus remains permanently in the body. HSV-1 is spread from person to person through close contact, such as through kissing, touching the affected area, or sharing personal items such as lip balm, razors, a drinking glass, or eating utensils. What increases the risk? You are more likely to develop this condition if you:  Are tired, stressed, or sick.  Are menstruating.  Are pregnant.  Take certain medicines.  Are exposed to cold weather or too much sun. What are the signs or symptoms? Symptoms of a cold sore outbreak go through different stages. These are the stages of a cold sore:  Tingling, itching, or burning is felt 1-2 days before the outbreak.  Fluid-filled blisters appear on the lips, inside the mouth, on the nose, or on the cheeks.  The blisters start to ooze clear fluid.  The blisters dry up, and a yellow crust appears in their place.  The crust falls off. In some cases, other symptoms can develop during a cold sore outbreak. These can include:  Fever.  Sore throat.  Headache.  Muscle aches.  Swollen neck glands. How is this diagnosed? This condition is diagnosed based on your medical history and a  physical exam. Your health care provider may do a blood test or may swab some fluid from your sore and then examine the swab in the lab. How is this treated? There is no cure for cold sores or HSV-1. There is also no vaccine for HSV-1. Most cold sores go away on their own without treatment within 2 weeks. Medicines cannot make the infection go away, but your health care provider may prescribe medicines to:  Help relieve some of the pain associated with the sores.  Work to stop the virus from multiplying.  Shorten healing time. Medicines may be in the form of creams, gels, pills, or a shot. Follow these instructions at home: Medicines  Take or apply over-the-counter and prescription medicines only as told by your health care provider.  Use a cotton-tip swab to apply creams or gels to your sores.  Ask your health care provider if you can take lysine supplements. Research has found that lysine may help heal the cold sore faster and prevent outbreaks. Sore care   Do not touch the sores or pick the scabs.  Wash your hands often. Do not touch your eyes without washing your hands first.  Keep the sores clean and dry.  If directed, apply ice to the sores: ? Put ice in a plastic bag. ? Place a towel between your skin and the bag. ? Leave the ice on for 20 minutes, 2-3 times a day. Eating and drinking  Eat a soft, bland diet. Avoid eating   hot, cold, or salty foods.  Use a straw if it hurts to drink out of a glass.  Eat foods that are rich in lysine, such as meat, fish, and dairy products.  Avoid sugary foods, chocolates, nuts, and grains. These foods are rich in a nutrient called arginine, which can cause the virus to multiply. Lifestyle  Do not kiss, have oral sex, or share personal items until your sores heal.  Stress, poor sleep, and being out in the sun can trigger outbreaks. Make sure you: ? Do activities that help you relax, such as deep breathing exercises or  meditation. ? Get enough sleep. ? Apply sunscreen on your lips before you go out in the sun. Contact a health care provider if:  You have symptoms for more than 2 weeks.  You have pus coming from the sores.  You have redness that is spreading.  You have pain or irritation in your eye.  You get sores on your genitals.  Your sores do not heal within 2 weeks.  You have frequent cold sore outbreaks. Get help right away if you have:  A fever and your symptoms suddenly get worse.  A headache and confusion.  Fatigue or loss of appetite.  A stiff neck or sensitivity to light. Summary  A cold sore, also called a fever blister, is a small, fluid-filled sore that forms inside the mouth or on the lips, gums, nose, chin, or cheeks.  Most cold sores go away on their own without treatment within 2 weeks. Your health care provider may prescribe medicines to help relieve some of the pain, work to stop the virus from multiplying, and shorten healing time.  Wash your hands often. Do not touch your eyes without washing your hands first.  Do not kiss, have oral sex, or share personal items until your sores heal.  Contact a health care provider if your sores do not heal within 2 weeks. This information is not intended to replace advice given to you by your health care provider. Make sure you discuss any questions you have with your health care provider. Document Revised: 05/16/2018 Document Reviewed: 06/26/2017 Elsevier Patient Education  2020 [employer]vier Inc.   

## 2019-06-07 NOTE — Progress Notes (Signed)
Established patient visit   Patient: Edward Mejia   DOB: Jul 31, 1945   74 y.o. Male  MRN: [id number]   I,Sulibeya S Dimas,acting as a scribe for Lavon Paganini, MD.,have documented all relevant documentation on the behalf of Lavon Paganini, MD,as directed by  Lavon Paganini, MD while in the presence of Lavon Paganini, MD.  Today's healthcare provider: Lavon Paganini, MD   Chief Complaint  Patient presents with  . Mouth Lesions   Subjective    HPI Patient here today C/O persistent mouth ulcers. Patient was seen on 05/30/2019 and was started on magic mouth wash, patient reports no improvement after using for 7 days. Patient reports he is able to eat and drink well.  Social History   Tobacco Use  . Smoking status: Former Research scientist (life sciences)  . Smokeless tobacco: Never Used  . Tobacco comment: quit over 40+ years ago  Substance Use Topics  . Alcohol use: No    Alcohol/week: 0.0 standard drinks  . Drug use: No       Medications: Outpatient Medications Prior to Visit  Medication Sig  . acetaminophen (TYLENOL) 500 MG tablet Take 1,000 mg by mouth 3 (three) times daily as needed for mild pain or headache.   . albuterol (PROVENTIL HFA) 108 (90 Base) MCG/ACT inhaler Inhale 2 puffs into the lungs every 6 (six) hours as needed for wheezing or shortness of breath.  Marland Kitchen apixaban (ELIQUIS) 5 MG TABS tablet Take 1 tablet (5 mg total) by mouth 2 (two) times daily.  Marland Kitchen etanercept (ENBREL) 50 MG/ML injection Inject 50 mg into the skin every Thursday.   . gabapentin (NEURONTIN) 300 MG capsule Take 600 mg by mouth See admin instructions. Take 600 mg by mouth once daily at 4 PM  . HYDROcodone-acetaminophen (NORCO) 7.5-325 MG tablet Take 1 tablet by mouth 3 (three) times daily as needed for moderate pain. (Patient taking differently: Take 1 tablet by mouth every 6 (six) hours. )  . isosorbide mononitrate (IMDUR) 120 MG 24 hr tablet Take 120 mg by mouth daily.   . magic mouthwash  w/lidocaine SOLN Take 5 mLs by mouth 4 (four) times daily as needed for mouth pain (Swish and Spit.).  Marland Kitchen misoprostol (CYTOTEC) 100 MCG tablet Take 100 mcg by mouth daily with lunch.   . nitroGLYCERIN (NITROSTAT) 0.4 MG SL tablet Place 0.4 mg under the tongue every 5 (five) minutes as needed for chest pain.  . predniSONE (DELTASONE) 5 MG tablet Take 7.5 mg by mouth daily with lunch.   . ranolazine (RANEXA) 500 MG 12 hr tablet Take 500 mg by mouth in the morning and at bedtime.  Marland Kitchen tiZANidine (ZANAFLEX) 2 MG tablet Take 2 mg by mouth at bedtime.  . valsartan-hydrochlorothiazide (DIOVAN-HCT) 160-12.5 MG tablet Take 1 tablet by mouth daily.   No facility-administered medications prior to visit.    Review of Systems  Constitutional: Negative for appetite change, chills and fatigue.  HENT: Positive for mouth sores.   Respiratory: Negative for chest tightness and shortness of breath.   Cardiovascular: Negative for chest pain and palpitations.      Objective    BP (!) 146/81   Pulse 71   Temp (!) 97.3 F (36.3 C) (Temporal)   Resp 16   Ht 6' (1.829 m)   Wt 202 lb 9.6 oz (91.9 kg)   BMI 27.48 kg/m  Wt Readings from Last 3 Encounters:  06/07/19 202 lb 9.6 oz (91.9 kg)  05/30/19 206 lb (93.4 kg)  05/08/19  215 lb (97.5 kg)      Physical Exam Constitutional:      General: He is not in acute distress.    Appearance: Normal appearance. He is not diaphoretic.  HENT:     Mouth/Throat:     Mouth: Mucous membranes are moist. Oral lesions present.     Dentition: Has dentures.  Eyes:     Conjunctiva/sclera: Conjunctivae normal.  Cardiovascular:     Rate and Rhythm: Normal rate and regular rhythm.     Pulses: Normal pulses.  Pulmonary:     Effort: Pulmonary effort is normal. No respiratory distress.     Breath sounds: Normal breath sounds.  Abdominal:     General: There is no distension.     Palpations: Abdomen is soft.     Tenderness: There is no abdominal tenderness.    Musculoskeletal:     Left lower leg: Edema present.  Skin:    General: Skin is warm and dry.     Findings: No rash.  Neurological:     Mental Status: He is alert and oriented to person, place, and time. Mental status is at baseline.  Psychiatric:        Mood and Affect: Mood normal.        Behavior: Behavior normal.        Thought Content: Thought content normal.        Judgment: Judgment normal.     No results found for any visits on 06/07/19.  Assessment & Plan     1. Oral herpes - ongoing issue since hospitalization - previously with blistering rash on lips, now with residual oral lesions - initially treated for aphthous ulcer with magic mouthwash - given rash that he had on lips and appearance on exam, suspect gingival oral herpes - discussed that valtrex is more effect earlier in course, but will start 7 day course - advised return precautions - valACYclovir (VALTREX) 1000 MG tablet; Take 1 tablet (1,000 mg total) by mouth 2 (two) times daily for 7 days.  Dispense: 14 tablet; Refill: 0   Return if symptoms worsen or fail to improve.      I, Lavon Paganini, MD, have reviewed all documentation for this visit. The documentation on 06/07/19 for the exam, diagnosis, procedures, and orders are all accurate and complete.   Tia Hieronymus, Dionne Bucy, MD, MPH [facility]

## 2019-06-17 ENCOUNTER — Telehealth: Payer: Self-pay | Admitting: Family Medicine

## 2019-06-17 DIAGNOSIS — I10 Essential (primary) hypertension: Secondary | ICD-10-CM

## 2019-06-17 MED ORDER — VALSARTAN-HYDROCHLOROTHIAZIDE 160-12.5 MG PO TABS: 1.0000 | ORAL_TABLET | Freq: Every day | ORAL | 3 refills | Status: DC

## 2019-06-17 NOTE — Telephone Encounter (Signed)
Please review.  Pt's Valsartan/HCTZ was changed to 80/12.5mg  05/08/2019   Thanks,   -Mickel Baas

## 2019-06-17 NOTE — Telephone Encounter (Signed)
[facility] faxed refill request for the following medications:  valsartan-hydrochlorothiazide (DIOVAN-HCT) 160-12.5 MG tablet   Please advise.  Thanks, [employer]

## 2019-06-25 DIAGNOSIS — M19011 Primary osteoarthritis, right shoulder: Secondary | ICD-10-CM | POA: Diagnosis not present

## 2019-06-25 DIAGNOSIS — M48061 Spinal stenosis, lumbar region without neurogenic claudication: Secondary | ICD-10-CM | POA: Diagnosis not present

## 2019-06-25 DIAGNOSIS — M5136 Other intervertebral disc degeneration, lumbar region: Secondary | ICD-10-CM | POA: Diagnosis not present

## 2019-06-25 DIAGNOSIS — M47816 Spondylosis without myelopathy or radiculopathy, lumbar region: Secondary | ICD-10-CM | POA: Diagnosis not present

## 2019-07-17 DIAGNOSIS — R070 Pain in throat: Secondary | ICD-10-CM | POA: Diagnosis not present

## 2019-07-17 DIAGNOSIS — L723 Sebaceous cyst: Secondary | ICD-10-CM | POA: Diagnosis not present

## 2019-07-26 DIAGNOSIS — Z85828 Personal history of other malignant neoplasm of skin: Secondary | ICD-10-CM | POA: Diagnosis not present

## 2019-07-26 DIAGNOSIS — L0109 Other impetigo: Secondary | ICD-10-CM | POA: Diagnosis not present

## 2019-08-27 DIAGNOSIS — L570 Actinic keratosis: Secondary | ICD-10-CM | POA: Diagnosis not present

## 2019-08-27 DIAGNOSIS — L821 Other seborrheic keratosis: Secondary | ICD-10-CM | POA: Diagnosis not present

## 2019-08-27 DIAGNOSIS — L814 Other melanin hyperpigmentation: Secondary | ICD-10-CM | POA: Diagnosis not present

## 2019-08-27 DIAGNOSIS — Z85828 Personal history of other malignant neoplasm of skin: Secondary | ICD-10-CM | POA: Diagnosis not present

## 2019-09-16 ENCOUNTER — Ambulatory Visit
Admission: RE | Admit: 2019-09-16 | Discharge: 2019-09-16 | Disposition: A | Discharge status: Home / Self Care | Payer: Medicare Other | Source: Ambulatory Visit | Attending: Cardiology | Admitting: Cardiology

## 2019-09-16 ENCOUNTER — Other Ambulatory Visit: Payer: Self-pay

## 2019-09-16 ENCOUNTER — Other Ambulatory Visit: Payer: Self-pay | Admitting: Cardiology

## 2019-09-16 DIAGNOSIS — R0602 Shortness of breath: Secondary | ICD-10-CM | POA: Diagnosis not present

## 2019-09-16 DIAGNOSIS — I25119 Atherosclerotic heart disease of native coronary artery with unspecified angina pectoris: Secondary | ICD-10-CM | POA: Diagnosis not present

## 2019-09-16 DIAGNOSIS — I2699 Other pulmonary embolism without acute cor pulmonale: Secondary | ICD-10-CM

## 2019-09-16 DIAGNOSIS — R0789 Other chest pain: Secondary | ICD-10-CM | POA: Diagnosis not present

## 2019-09-16 DIAGNOSIS — I10 Essential (primary) hypertension: Secondary | ICD-10-CM | POA: Diagnosis not present

## 2019-09-16 LAB — POCT I-STAT CREATININE: Creatinine, Ser: 1.3 mg/dL — ABNORMAL HIGH (ref 0.61–1.24)

## 2019-09-16 MED ORDER — IOHEXOL 350 MG/ML SOLN
75.0000 mL | Freq: Once | INTRAVENOUS | Status: AC | PRN
  Administered 2019-09-16: 75 mL via INTRAVENOUS

## 2019-09-16 NOTE — Progress Notes (Signed)
Established patient visit   Patient: Edward Mejia   DOB: 11/29/1945   74 y.o. Male  MRN: [id number] Visit Date: 09/17/2019  Today's healthcare provider: Lelon Huh, MD   Chief Complaint  Patient presents with   Constipation   Hemorrhoids  Blood clot Rheumatology   Subjective    HPI   The patient is a 74 year old male who presents with multiple requests.   1. Constipation and Hemorrhoids.  The patient has been seeing a gastroenterologist for these issues.  He would like to have PCP take care of this if possible.  He is currently in need of refill on Hydrocortisone cream for his Hemorrhoids which he states works very well, but they are brought on by chronic constipation which is not well controlled. His previous GI started him on daily stool softeners, but is he has also put himself on daily milk of magnesia every night.  2. Rheumatology- patient has been seeing rheumatologist in [facility] for psoriatic arthritis and is on Embrel, but his rheumatologist. He needs a new physician to prescribe medication  3. Blood clots. Patient has multiple LE and pulmonary Pes in aftermath of Covid in March and has been Eliquis ever since. He had no prior VTs. He did have one follow up with vascular regarding DVTs and was recommended to complete 6 months course of Eliquis.   Medications: Outpatient Medications Prior to Visit  Medication Sig   acetaminophen (TYLENOL) 500 MG tablet Take 1,000 mg by mouth 3 (three) times daily as needed for mild pain or headache.    albuterol (PROVENTIL HFA) 108 (90 Base) MCG/ACT inhaler Inhale 2 puffs into the lungs every 6 (six) hours as needed for wheezing or shortness of breath.   apixaban (ELIQUIS) 5 MG TABS tablet Take 1 tablet (5 mg total) by mouth 2 (two) times daily.   etanercept (ENBREL) 50 MG/ML injection Inject 50 mg into the skin every Thursday.    gabapentin (NEURONTIN) 300 MG capsule Take 900 mg by mouth See admin instructions. Take  600 mg by mouth once daily at 4 PM   HYDROcodone-acetaminophen (NORCO) 7.5-325 MG tablet Take 1 tablet by mouth 3 (three) times daily as needed for moderate pain. (Patient taking differently: Take 1 tablet by mouth every 6 (six) hours. )   isosorbide mononitrate (IMDUR) 120 MG 24 hr tablet Take 120 mg by mouth daily.    magic mouthwash w/lidocaine SOLN Take 5 mLs by mouth 4 (four) times daily as needed for mouth pain (Swish and Spit.).   misoprostol (CYTOTEC) 100 MCG tablet Take 100 mcg by mouth daily with lunch.    nitroGLYCERIN (NITROSTAT) 0.4 MG SL tablet Place 0.4 mg under the tongue every 5 (five) minutes as needed for chest pain.   predniSONE (DELTASONE) 5 MG tablet Take 7.5 mg by mouth daily with lunch.    ranolazine (RANEXA) 500 MG 12 hr tablet Take 500 mg by mouth in the morning and at bedtime.   tiZANidine (ZANAFLEX) 2 MG tablet Take 2 mg by mouth at bedtime.   valsartan-hydrochlorothiazide (DIOVAN-HCT) 160-12.5 MG tablet Take 1 tablet by mouth daily.   No facility-administered medications prior to visit.    Review of Systems  Constitutional: Negative for fatigue and fever.  Respiratory: Negative for cough, shortness of breath and wheezing.   Cardiovascular: Negative for chest pain, palpitations and leg swelling.  Gastrointestinal: Positive for constipation. Negative for abdominal pain, anal bleeding, blood in stool, diarrhea, nausea, rectal pain and vomiting.  Neurological: Negative for dizziness and headaches.      Objective    BP 140/60 (BP Location: Right Arm, Patient Position: Sitting, Cuff Size: Normal)    Pulse 77    Temp 98.2 F (36.8 C) (Oral)    Wt 218 lb (98.9 kg)    SpO2 98%    BMI 29.57 kg/m    Physical Exam   General: Appearance:     Overweight male in no acute distress  Eyes:    PERRL, conjunctiva/corneas clear, EOM's intact       Lungs:     Clear to auscultation bilaterally, respirations unlabored  Heart:    Normal heart rate. Normal rhythm. No  murmurs, rubs, or gallops.   MS:   All extremities are intact.   Neurologic:   Awake, alert, oriented x 3. No apparent focal neurological           defect.        No results found for any visits on 09/17/19.  Assessment & Plan     1. Acute deep vein thrombosis (DVT) of distal vein of left lower extremity ([place]) Complication of Covid 19 in march 2021. Has nearly completed recommend 6 month course of NOAC. No other history of VTs. May d/c Eliquis when current bottle runs out the first week of September 2021.   2. History of 2019 novel coronavirus disease (COVID-19)   3. Chronic constipation Recommend he start daily metamucil, continue docusate, and work on getting of MOM. Consider Miralax, senna, or Amitiza.   4. Hemorrhoids, unspecified hemorrhoid type 2.5% HC previously prescribed by GI is working very well. reill - hydrocortisone 2.5 % cream; Apply topically 2 (two) times daily. As needed  Dispense: 30 g; Refill: 3  5. Essential hypertension Well controlled.  Continue current medications.   - Comprehensive metabolic panel - CBC  6. Psoriatic arthritis ([place]) Advised that autoimmune disease require specialized treatment outside of my scope of practice. Offered referral to local rheumatologist since his previous MD has left the practice, but he prefers to establish with a new rheumatologist in [place].   7. Hypothyroidism, unspecified type Due to check  - TSH          The entirety of the information documented in the History of Present Illness, Review of Systems and Physical Exam were personally obtained by me. Portions of this information were initially documented by the CMA and reviewed by me for thoroughness and accuracy.      Lelon Huh, MD  [facility] [phone] (phone) [phone] (fax)  [place]

## 2019-09-17 ENCOUNTER — Ambulatory Visit (INDEPENDENT_AMBULATORY_CARE_PROVIDER_SITE_OTHER): Payer: Medicare Other | Admitting: Family Medicine

## 2019-09-17 VITALS — BP 140/60 | HR 77 | Temp 98.2°F | Wt 218.0 lb

## 2019-09-17 DIAGNOSIS — K5909 Other constipation: Secondary | ICD-10-CM

## 2019-09-17 DIAGNOSIS — Z8616 Personal history of COVID-19: Secondary | ICD-10-CM

## 2019-09-17 DIAGNOSIS — I10 Essential (primary) hypertension: Secondary | ICD-10-CM

## 2019-09-17 DIAGNOSIS — L4050 Arthropathic psoriasis, unspecified: Secondary | ICD-10-CM

## 2019-09-17 DIAGNOSIS — K649 Unspecified hemorrhoids: Secondary | ICD-10-CM | POA: Diagnosis not present

## 2019-09-17 DIAGNOSIS — E039 Hypothyroidism, unspecified: Secondary | ICD-10-CM | POA: Diagnosis not present

## 2019-09-17 DIAGNOSIS — I824Z2 Acute embolism and thrombosis of unspecified deep veins of left distal lower extremity: Secondary | ICD-10-CM | POA: Diagnosis not present

## 2019-09-17 MED ORDER — HYDROCORTISONE 2.5 % EX CREA: TOPICAL_CREAM | Freq: Two times a day (BID) | CUTANEOUS | 3 refills | Status: DC

## 2019-09-17 NOTE — Patient Instructions (Addendum)
   Please review the attached list of medications and notify my office if there are any errors.   . Please bring all of your medications to every appointment so we can make sure that our medication list is the same as yours.    You can stop taking Eliquis after your finish this months prescription   Start taking one heaping spoonful of Metamucil mixed in liquid every evening

## 2019-09-18 ENCOUNTER — Telehealth: Payer: Self-pay

## 2019-09-18 DIAGNOSIS — D751 Secondary polycythemia: Secondary | ICD-10-CM

## 2019-09-18 LAB — COMPREHENSIVE METABOLIC PANEL
ALT: 13 IU/L (ref 0–44)
AST: 14 IU/L (ref 0–40)
Albumin/Globulin Ratio: 2.1 (ref 1.2–2.2)
Albumin: 4.2 g/dL (ref 3.7–4.7)
Alkaline Phosphatase: 51 IU/L (ref 48–121)
BUN/Creatinine Ratio: 10 (ref 10–24)
BUN: 12 mg/dL (ref 8–27)
Bilirubin Total: 1 mg/dL (ref 0.0–1.2)
CO2: 23 mmol/L (ref 20–29)
Calcium: 9.1 mg/dL (ref 8.6–10.2)
Chloride: 93 mmol/L — ABNORMAL LOW (ref 96–106)
Creatinine, Ser: 1.23 mg/dL (ref 0.76–1.27)
GFR calc Af Amer: 66 mL/min/{1.73_m2} (ref >59)
GFR calc non Af Amer: 57 mL/min/{1.73_m2} — ABNORMAL LOW (ref >59)
Globulin, Total: 2 g/dL (ref 1.5–4.5)
Glucose: 61 mg/dL — ABNORMAL LOW (ref 65–99)
Potassium: 4.6 mmol/L (ref 3.5–5.2)
Sodium: 133 mmol/L — ABNORMAL LOW (ref 134–144)
Total Protein: 6.2 g/dL (ref 6.0–8.5)

## 2019-09-18 LAB — CBC
Hematocrit: 54.5 % — ABNORMAL HIGH (ref 37.5–51.0)
Hemoglobin: 17.9 g/dL — ABNORMAL HIGH (ref 13.0–17.7)
MCH: 27 pg (ref 26.6–33.0)
MCHC: 32.8 g/dL (ref 31.5–35.7)
MCV: 82 fL (ref 79–97)
Platelets: 515 10*3/uL — ABNORMAL HIGH (ref 150–450)
RBC: 6.64 x10E6/uL — ABNORMAL HIGH (ref 4.14–5.80)
RDW: 14.2 % (ref 11.6–15.4)
WBC: 12 10*3/uL — ABNORMAL HIGH (ref 3.4–10.8)

## 2019-09-18 LAB — TSH: TSH: 8.3 u[IU]/mL — ABNORMAL HIGH (ref 0.450–4.500)

## 2019-09-18 NOTE — Telephone Encounter (Signed)
Patient's wife advised. Lab added. Referral placed.

## 2019-09-18 NOTE — Telephone Encounter (Signed)
-----   Message from Birdie Sons, MD sent at 09/18/2019  2:57 PM EDT ----- His blood cell counts have all been slowly going up over the last 6 months. I don't really have an explanation for this. He needs referral to hematology to evaluate polycythemia.  He is also a little bit hypothyroid, please add free T4 to his labs. He will probably need to take a thyroid hormone, but we send see lab results first.

## 2019-09-19 LAB — SPECIMEN STATUS REPORT

## 2019-09-20 ENCOUNTER — Encounter: Payer: Self-pay | Admitting: Oncology

## 2019-09-20 ENCOUNTER — Inpatient Hospital Stay: Payer: Medicare Other

## 2019-09-20 ENCOUNTER — Inpatient Hospital Stay: Payer: Medicare Other | Attending: Oncology | Admitting: Oncology

## 2019-09-20 ENCOUNTER — Other Ambulatory Visit: Payer: Self-pay

## 2019-09-20 VITALS — BP 158/85 | HR 66 | Temp 97.6°F | Resp 18 | Wt 215.7 lb

## 2019-09-20 DIAGNOSIS — D751 Secondary polycythemia: Secondary | ICD-10-CM | POA: Insufficient documentation

## 2019-09-20 DIAGNOSIS — D45 Polycythemia vera: Secondary | ICD-10-CM | POA: Insufficient documentation

## 2019-09-20 DIAGNOSIS — D75839 Thrombocytosis, unspecified: Secondary | ICD-10-CM

## 2019-09-20 DIAGNOSIS — D72820 Lymphocytosis (symptomatic): Secondary | ICD-10-CM | POA: Insufficient documentation

## 2019-09-20 DIAGNOSIS — R42 Dizziness and giddiness: Secondary | ICD-10-CM | POA: Diagnosis not present

## 2019-09-20 DIAGNOSIS — Z7982 Long term (current) use of aspirin: Secondary | ICD-10-CM | POA: Diagnosis not present

## 2019-09-20 DIAGNOSIS — D72829 Elevated white blood cell count, unspecified: Secondary | ICD-10-CM

## 2019-09-20 DIAGNOSIS — M069 Rheumatoid arthritis, unspecified: Secondary | ICD-10-CM | POA: Insufficient documentation

## 2019-09-20 DIAGNOSIS — Z86711 Personal history of pulmonary embolism: Secondary | ICD-10-CM | POA: Diagnosis not present

## 2019-09-20 DIAGNOSIS — Z86718 Personal history of other venous thrombosis and embolism: Secondary | ICD-10-CM | POA: Insufficient documentation

## 2019-09-20 DIAGNOSIS — Z7901 Long term (current) use of anticoagulants: Secondary | ICD-10-CM | POA: Insufficient documentation

## 2019-09-20 DIAGNOSIS — D473 Essential (hemorrhagic) thrombocythemia: Secondary | ICD-10-CM | POA: Diagnosis not present

## 2019-09-20 HISTORY — DX: Secondary polycythemia: D75.1

## 2019-09-20 LAB — CBC WITH DIFFERENTIAL/PLATELET
Abs Immature Granulocytes: 0.22 10*3/uL — ABNORMAL HIGH (ref 0.00–0.07)
Basophils Absolute: 0.1 10*3/uL (ref 0.0–0.1)
Basophils Relative: 1 %
Eosinophils Absolute: 0.1 10*3/uL (ref 0.0–0.5)
Eosinophils Relative: 1 %
HCT: 53.2 % — ABNORMAL HIGH (ref 39.0–52.0)
Hemoglobin: 17.6 g/dL — ABNORMAL HIGH (ref 13.0–17.0)
Immature Granulocytes: 2 %
Lymphocytes Relative: 25 %
Lymphs Abs: 2.7 10*3/uL (ref 0.7–4.0)
MCH: 27.1 pg (ref 26.0–34.0)
MCHC: 33.1 g/dL (ref 30.0–36.0)
MCV: 81.8 fL (ref 80.0–100.0)
Monocytes Absolute: 1.2 10*3/uL — ABNORMAL HIGH (ref 0.1–1.0)
Monocytes Relative: 11 %
Neutro Abs: 6.5 10*3/uL (ref 1.7–7.7)
Neutrophils Relative %: 60 %
Platelets: 459 10*3/uL — ABNORMAL HIGH (ref 150–400)
RBC: 6.5 MIL/uL — ABNORMAL HIGH (ref 4.22–5.81)
RDW: 16.1 % — ABNORMAL HIGH (ref 11.5–15.5)
WBC: 10.8 10*3/uL — ABNORMAL HIGH (ref 4.0–10.5)
nRBC: 0 % (ref 0.0–0.2)

## 2019-09-20 LAB — LACTATE DEHYDROGENASE: LDH: 150 U/L (ref 98–192)

## 2019-09-20 LAB — IRON AND TIBC
Iron: 49 ug/dL (ref 45–182)
Saturation Ratios: 15 % — ABNORMAL LOW (ref 17.9–39.5)
TIBC: 318 ug/dL (ref 250–450)
UIBC: 269 ug/dL

## 2019-09-20 LAB — TECHNOLOGIST SMEAR REVIEW: Plt Morphology: INCREASED

## 2019-09-20 LAB — FERRITIN: Ferritin: 24 ng/mL (ref 24–336)

## 2019-09-20 NOTE — Progress Notes (Signed)
Hematology/Oncology Consult note [facility] [phone] [phone]   Patient Care Team: Birdie Sons, MD as PCP - General (Family Medicine) Lorelee Cover., MD as Consulting Physician (Ophthalmology) Quintin Alto, MD as Referring Physician (Rheumatology) Dorene Ar, MD (Pain Medicine) Ubaldo Glassing Javier Docker, MD as Consulting Physician (Cardiology)  REFERRING PROVIDER: Birdie Sons, MD  CHIEF COMPLAINTS/REASON FOR VISIT:  Evaluation of polycytosis  HISTORY OF PRESENTING ILLNESS:   Edward Mejia is a  74 y.o.  male with PMH including rheumatoid arthritis, peripheral vascular disease hypertension, Covid infection history,, was seen in consultation at the request of  Caryn Section, Kirstie Peri, MD  for evaluation of polycytosis Patient was accompanied by her wife who participated in providing medical history. February 2021, patient was hospitalized due to COVID-19 pneumonia, respiratory failure, treated with steroids, remdesivir, received bamlanivimab.  Hospitalization was also completed by [facility], bleeding was  thought to be secondary to hemorrhoids.  He was discharged on 04/07/2019 and presented back to emergency room on 04/12/2019 due to left lower extremity swelling, acute hypoxic respiratory failure.  Work-up showed acute bilateral pulmonary embolism, large left lower extremity DVT, thrombosis and IVC and left external iliac vein.  Patient was started on anticoagulation and transition to Eliquis Patient has been on Eliquis since March 2021.  Patient also takes aspirin.  Patient has history of CAD, follows up with Dr. Ubaldo Glassing.  Rheumatoid arthritis on Enbrel once a week.  Also on prednisone 7.5 mg daily. Patient has also been found to have erythrocytosis. 09/20/2019, CBC showed hemoglobin of 17.6, reviewed previous medical records, erythrocytosis can be traced back to at least 2017.  Patient also has leukocytosis since March 2021.  Leukocytosis has  trended down recently and CBC on 09/20/2019 showed a white count of 10.8, predominantly monocytosis.  Patient is a former smoker, 10-12-pack-year smoking history and he stopped smoking in 1984. Currently he has some chronic shortness of breath with exertion.  He has lost weight during his previous hospitalization and has gained weight since discharge.  Sometimes he feels sweaty.  Patient also reports intermittent dizziness/lightheaded episodes for the past couple of months.  He actually had an episode during today's visit and is spontaneously resolved after few minutes.  Denies any shortness of breath, chest pain, vision changes, focal deficits.  Patient has been referred to establish care with neurology for further evaluation.   Review of Systems  Constitutional: Negative for appetite change, chills, fatigue and fever.  HENT:   Negative for hearing loss and voice change.   Eyes: Negative for eye problems.  Respiratory: Negative for chest tightness and cough.   Cardiovascular: Negative for chest pain.  Gastrointestinal: Negative for abdominal distention, abdominal pain and blood in stool.  Endocrine: Negative for hot flashes.  Genitourinary: Negative for difficulty urinating and frequency.   Musculoskeletal: Negative for arthralgias.  Skin: Negative for itching and rash.  Neurological: Positive for light-headedness. Negative for extremity weakness.  Hematological: Negative for adenopathy.  Psychiatric/Behavioral: Negative for confusion.    MEDICAL HISTORY:  Past Medical History:  Diagnosis Date  . Arthritis   . Bilateral pulmonary embolism ([place]) 03/2019   in setting of Covid-19 pneumonia  . Cancer ([place])    skin cancer  . Cellulitis   . Cellulitis   . DVT (deep venous thrombosis) ([place]) 03/2019   in setting of Covid-19 pneumonia  . H/O adenomatous polyp of colon 03/24/2015  . History of brain disorder: history of amaurosis fugax 03/24/2015  . History of  pneumonia 04/04/2008  . History  of rheumatic fever 03/24/2015   DID Have Rheumatic Fever.    . Hypertension   . Peptic ulcer disease   . Sepsis ([place]) 04/12/2019   secondary to Covid pneumonia    SURGICAL HISTORY: Past Surgical History:  Procedure Laterality Date  . APPENDECTOMY    . LEFT HEART CATH AND CORONARY ANGIOGRAPHY Left 02/06/2018   Procedure: LEFT HEART CATH AND CORONARY ANGIOGRAPHY;  Surgeon: Teodoro Spray, MD;  Location: [facility];  Service: Cardiovascular;  Laterality: Left;  Marland Kitchen MANDIBLE FRACTURE SURGERY     fractured jaw  . PARTIAL GASTRECTOMY     peptic ulcer disease    SOCIAL HISTORY: Social History   Socioeconomic History  . Marital status: Married    Spouse name: Lovey Newcomer   . Number of children: 4  . Years of education: Not on file  . Highest education level: 8th grade  Occupational History  . Occupation: Disabled    Comment: due to Psoriatic Arthritis  Tobacco Use  . Smoking status: Former Smoker    Quit date: 09/20/1982    Years since quitting: 37.0  . Smokeless tobacco: Never Used  . Tobacco comment: quit over 40+ years ago  Vaping Use  . Vaping Use: Never used  Substance and Sexual Activity  . Alcohol use: No    Alcohol/week: 0.0 standard drinks  . Drug use: No  . Sexual activity: Not on file  Other Topics Concern  . Not on file  Social History Narrative  . Not on file   Social Determinants of Health   Financial Resource Strain: Low Risk   . Difficulty of Paying Living Expenses: Not hard at all  Food Insecurity:   . Worried About Charity fundraiser in the Last Year:   . Arboriculturist in the Last Year:   Transportation Needs:   . Film/video editor (Medical):   Marland Kitchen Lack of Transportation (Non-Medical):   Physical Activity: Unknown  . Days of Exercise per Week: Not on file  . Minutes of Exercise per Session: 0 min  Stress: No Stress Concern Present  . Feeling of Stress : Not at all  Social Connections: Unknown  . Frequency of Communication with Friends  and Family: Patient refused  . Frequency of Social Gatherings with Friends and Family: Patient refused  . Attends Religious Services: Patient refused  . Active Member of Clubs or Organizations: Patient refused  . Attends Archivist Meetings: Patient refused  . Marital Status: Patient refused  Intimate Partner Violence: Unknown  . Fear of Current or Ex-Partner: Patient refused  . Emotionally Abused: Patient refused  . Physically Abused: Patient refused  . Sexually Abused: Patient refused    FAMILY HISTORY: Family History  Problem Relation Age of Onset  . Cancer Mother        lung cancer  . Heart disease Father 53       heart attack  . Cancer Brother        pancreatic cancer  . Cancer Brother        pancreatic cancer    ALLERGIES:  is allergic to pravachol [pravastatin].  MEDICATIONS:  Current Outpatient Medications  Medication Sig Dispense Refill  . acetaminophen (TYLENOL) 500 MG tablet Take 1,000 mg by mouth 3 (three) times daily as needed for mild pain or headache.     . albuterol (PROVENTIL HFA) 108 (90 Base) MCG/ACT inhaler Inhale 2 puffs into the lungs every 6 (six)  hours as needed for wheezing or shortness of breath.    Marland Kitchen apixaban (ELIQUIS) 5 MG TABS tablet Take 1 tablet (5 mg total) by mouth 2 (two) times daily. 60 tablet 4  . aspirin EC 81 MG tablet Take 81 mg by mouth daily. Swallow whole.    . etanercept (ENBREL) 50 MG/ML injection Inject 50 mg into the skin every Thursday.     . gabapentin (NEURONTIN) 300 MG capsule Take 900 mg by mouth See admin instructions. Take 600 mg by mouth once daily at 4 PM    . HYDROcodone-acetaminophen (NORCO) 7.5-325 MG tablet Take 1 tablet by mouth 3 (three) times daily as needed for moderate pain. (Patient taking differently: Take 1 tablet by mouth every 6 (six) hours. ) 90 tablet 0  . hydrocortisone 2.5 % cream Apply topically 2 (two) times daily. As needed 30 g 3  . isosorbide mononitrate (IMDUR) 120 MG 24 hr tablet Take 120  mg by mouth daily.     . misoprostol (CYTOTEC) 100 MCG tablet Take 100 mcg by mouth daily with lunch.     . nitroGLYCERIN (NITROSTAT) 0.4 MG SL tablet Place 0.4 mg under the tongue every 5 (five) minutes as needed for chest pain.    . predniSONE (DELTASONE) 5 MG tablet Take 7.5 mg by mouth daily with lunch.     . ranolazine (RANEXA) 500 MG 12 hr tablet Take 500 mg by mouth in the morning and at bedtime.    Marland Kitchen tiZANidine (ZANAFLEX) 2 MG tablet Take 2 mg by mouth at bedtime.    . valsartan-hydrochlorothiazide (DIOVAN-HCT) 160-12.5 MG tablet Take 1 tablet by mouth daily. 90 tablet 3  . magic mouthwash w/lidocaine SOLN Take 5 mLs by mouth 4 (four) times daily as needed for mouth pain (Swish and Spit.). (Patient not taking: Reported on 09/20/2019) 200 mL 0   No current facility-administered medications for this visit.     PHYSICAL EXAMINATION: ECOG PERFORMANCE STATUS: 1 - Symptomatic but completely ambulatory See Epics.  . Physical Exam Constitutional:      General: He is not in acute distress. HENT:     Head: Normocephalic and atraumatic.  Eyes:     General: No scleral icterus. Cardiovascular:     Rate and Rhythm: Normal rate and regular rhythm.     Heart sounds: Normal heart sounds.  Pulmonary:     Effort: Pulmonary effort is normal. No respiratory distress.     Breath sounds: No wheezing.  Abdominal:     General: Bowel sounds are normal. There is no distension.     Palpations: Abdomen is soft.  Musculoskeletal:        General: No deformity. Normal range of motion.     Cervical back: Normal range of motion and neck supple.  Skin:    General: Skin is warm and dry.     Findings: No erythema or rash.  Neurological:     Mental Status: He is alert and oriented to person, place, and time. Mental status is at baseline.     Cranial Nerves: No cranial nerve deficit.     Coordination: Coordination normal.  Psychiatric:        Mood and Affect: Mood normal.     LABORATORY DATA:  I  have reviewed the data as listed Lab Results  Component Value Date   WBC 10.8 (H) 09/20/2019   HGB 17.6 (H) 09/20/2019   HCT 53.2 (H) 09/20/2019   MCV 81.8 09/20/2019   PLT 459 (H) 09/20/2019  Recent Labs    04/12/19 1045 04/13/19 0418 04/16/19 0243 04/16/19 0243 04/26/19 1545 09/16/19 1058 09/17/19 1049  NA 131*   < > 134*  --  133*  --  133*  K 5.1   < > 4.3  --  4.8  --  4.6  CL 101   < > 95*  --  95*  --  93*  CO2 16*   < > 26  --  23  --  23  GLUCOSE 102*   < > 75  --  125*  --  61*  BUN 17   < > 18  --  10  --  12  CREATININE 0.91   < > 1.05   < > 1.17 1.30* 1.23  CALCIUM 8.0*   < > 7.9*  --  8.4*  --  9.1  GFRNONAA >60   < > >60  --  61  --  57*  GFRAA >60   < > >60  --  71  --  66  PROT 5.9*   < > 5.0*  --  5.7*  --  6.2  ALBUMIN 2.6*   < > 2.4*  --  3.5*  --  4.2  AST 29   < > 26  --  22  --  14  ALT 23   < > 32  --  17  --  13  ALKPHOS 49   < > 47  --  79  --  51  BILITOT 2.6*   < > 1.5*  --  1.3*  --  1.0  BILIDIR 0.4*  --   --   --   --   --   --   IBILI 2.2*  --   --   --   --   --   --    < > = values in this interval not displayed.   Iron/TIBC/Ferritin/ %Sat    Component Value Date/Time   IRON 49 09/20/2019 1201   TIBC 318 09/20/2019 1201   FERRITIN 24 09/20/2019 1201   IRONPCTSAT 15 (L) 09/20/2019 1201      RADIOGRAPHIC STUDIES: I have personally reviewed the radiological images as listed and agreed with the findings in the report. CT ANGIO CHEST PE W OR WO CONTRAST  Result Date: 09/16/2019 CLINICAL DATA:  Chest pain and shortness of breath EXAM: CT ANGIOGRAPHY CHEST WITH CONTRAST TECHNIQUE: Multidetector CT imaging of the chest was performed using the standard protocol during bolus administration of intravenous contrast. Multiplanar CT image reconstructions and MIPs were obtained to evaluate the vascular anatomy. CONTRAST:  38mL OMNIPAQUE IOHEXOL 350 MG/ML SOLN COMPARISON:  Jun 12, 2019 CT angiogram chest. FINDINGS: Cardiovascular: Since prior  study, there has been interval resolution of pulmonary embolus. No pulmonary emboli evident currently. There is no appreciable thoracic aortic aneurysm. No dissection evident. Note that the contrast bolus within the aorta is not sufficient for confident dissection assessment. There are scattered foci of calcification in visualized great vessels. There are foci of aortic atherosclerosis. There are multiple foci of coronary artery calcification. There is no pericardial effusion or pericardial thickening. Mediastinum/Nodes: Thyroid appears unremarkable. There is no appreciable thoracic adenopathy. No esophageal lesions are appreciable. Lungs/Pleura: There are multiple scattered foci of airspace opacity bilaterally with overall partial but incomplete clearing of areas of consolidation at multiple sites in the upper and lower lobes. Scattered areas of atelectatic change, primarily in the bases, also present. No new opacities are evident. No pleural effusions.  Upper Abdomen: Postoperative changes and arterial vascular calcification noted in upper abdomen. Visualized upper abdominal structures otherwise appear unremarkable. Musculoskeletal: There are foci of degenerative change in the thoracic spine. There are no blastic or lytic bone lesions. No chest wall lesions appreciable. Review of the MIP images confirms the above findings. IMPRESSION: 1. Previous pulmonary emboli bilaterally have resolved since prior study. Currently no evidence of pulmonary embolus. 2. No thoracic aortic aneurysm. No dissection evident; note that contrast bolus in the aorta is not sufficient for confident dissection assessment. There are foci of aortic atherosclerosis as well as foci of great vessel and coronary artery calcification. 3. Airspace opacity bilaterally, less pronounced than on previous study. Residua from atypical organism pneumonia suspected. Scattered areas of atelectasis also present. 4.  No adenopathy evident. Aortic  Atherosclerosis (ICD10-I70.0). Electronically Signed   By: Lowella Grip III M.D.   On: 09/16/2019 11:18      ASSESSMENT & PLAN:  1. Erythrocytosis   2. Leukocytosis, unspecified type   3. Thrombocytosis ([place])   4. History of pulmonary embolism   5. History of DVT of lower extremity   6. Dizziness    #Erythrocytosis, chronic.  Hematocrit is above 50, primary versus secondary process. We will check CBC, smear, JAK2 V617F mutation, with reflex to other mutations CALR, MPL, JAK 2 Ex 12-15 mutations, erythropoietin level, carbon monoxide level, BCR ABL Given that his hematocrit is persistently above 50, I recommend with phlebotomy 300 cc x 1 while waiting for work-up results. Rationale and potential side effects were discussed with patient.  He agrees with the plan. Depending on the work-up results, will decide whether he is phlebotomy threshold.  In the future.  #Chronic thrombocytosis, platelet counts are intermittently elevated.  Check iron panel. #Chronic leukocytosis reactive versus secondary to myeloproliferative disease.  Check flow cytometry, LDH #History of acute bilateral pulmonary embolism, acute lower extremity extensive thrombosis, continue Eliquis 5 mg twice daily. If no MPN is discovered, patient will need total 6 months of anticoagulation given that blood clots most likely is provoked by hypercoagulable state due to Covid infection and hospitalization.  If MPN work-up is positive, he may need longer duration of anticoagulation.  #Intermittent lightheaded/dizziness, unknown etiology.  This seems to be a chronic issue for patient Advised patient that if he experiences worsening of his symptoms, recommend him to go to emergency room for evaluation.  Otherwise follow-up with neurology.  Orders Placed This Encounter  Procedures  . CBC with Differential/Platelet    Standing Status:   Future    Number of Occurrences:   1    Standing Expiration Date:   09/19/2020  . JAK2  V617F, w Reflex to CALR/E12/MPL    Standing Status:   Future    Number of Occurrences:   1    Standing Expiration Date:   09/19/2020  . BCR-ABL1 FISH    Standing Status:   Future    Number of Occurrences:   1    Standing Expiration Date:   09/19/2020  . Carbon monoxide, blood (performed at ref lab)    Standing Status:   Future    Number of Occurrences:   1    Standing Expiration Date:   09/19/2020  . Erythropoietin    Standing Status:   Future    Number of Occurrences:   1    Standing Expiration Date:   09/19/2020  . Technologist smear review    Standing Status:   Future    Number of Occurrences:  1    Standing Expiration Date:   09/19/2020  . Ferritin    Standing Status:   Future    Number of Occurrences:   1    Standing Expiration Date:   03/22/2020  . Iron and TIBC    Standing Status:   Future    Number of Occurrences:   1    Standing Expiration Date:   09/19/2020  . Lactate dehydrogenase    Standing Status:   Future    Number of Occurrences:   1    Standing Expiration Date:   09/19/2020  . Flow cytometry panel-leukemia/lymphoma work-up    Standing Status:   Future    Number of Occurrences:   1    Standing Expiration Date:   09/19/2020    All questions were answered. The patient knows to call the clinic with any problems questions or concerns.  cc Birdie Sons, MD    Return of visit: Follow-up in 2 weeks thank you for this kind referral and the opportunity to participate in the care of this patient. A copy of today's note is routed to referring provider    Earlie Server, MD, PhD Hematology Oncology [facility] Pager- [phone] 09/20/2019

## 2019-09-20 NOTE — Progress Notes (Signed)
New patient for hematology evaluation.

## 2019-09-20 NOTE — Addendum Note (Signed)
Addended by: Earlie Server on: 09/20/2019 04:57 PM   Modules accepted: Orders

## 2019-09-23 LAB — CARBON MONOXIDE, BLOOD (PERFORMED AT REF LAB): Carbon Monoxide, Blood: 1 % (ref 0.0–3.6)

## 2019-09-23 LAB — ERYTHROPOIETIN: Erythropoietin: 1.9 m[IU]/mL — ABNORMAL LOW (ref 2.6–18.5)

## 2019-09-24 LAB — COMP PANEL: LEUKEMIA/LYMPHOMA: Immunophenotypic Profile: 3

## 2019-09-24 LAB — JAK2 V617F, W REFLEX TO CALR/E12/MPL

## 2019-09-25 ENCOUNTER — Other Ambulatory Visit: Payer: Self-pay

## 2019-09-25 ENCOUNTER — Inpatient Hospital Stay: Payer: Medicare Other

## 2019-09-25 VITALS — BP 138/65 | HR 71 | Resp 18

## 2019-09-25 DIAGNOSIS — D45 Polycythemia vera: Secondary | ICD-10-CM | POA: Diagnosis not present

## 2019-09-25 DIAGNOSIS — D751 Secondary polycythemia: Secondary | ICD-10-CM

## 2019-09-25 NOTE — Progress Notes (Signed)
300cc of blood removed via therapeutic phlebotomy. Pt tolerated well. VSS at discharge.

## 2019-09-28 LAB — BCR-ABL1 FISH
Cells Analyzed: 200
Cells Counted: 200

## 2019-09-30 DIAGNOSIS — Z79899 Other long term (current) drug therapy: Secondary | ICD-10-CM | POA: Diagnosis not present

## 2019-09-30 DIAGNOSIS — L4050 Arthropathic psoriasis, unspecified: Secondary | ICD-10-CM | POA: Diagnosis not present

## 2019-10-01 DIAGNOSIS — M48061 Spinal stenosis, lumbar region without neurogenic claudication: Secondary | ICD-10-CM | POA: Diagnosis not present

## 2019-10-01 DIAGNOSIS — M47816 Spondylosis without myelopathy or radiculopathy, lumbar region: Secondary | ICD-10-CM | POA: Diagnosis not present

## 2019-10-01 DIAGNOSIS — M5136 Other intervertebral disc degeneration, lumbar region: Secondary | ICD-10-CM | POA: Diagnosis not present

## 2019-10-01 DIAGNOSIS — M19011 Primary osteoarthritis, right shoulder: Secondary | ICD-10-CM | POA: Diagnosis not present

## 2019-10-04 ENCOUNTER — Inpatient Hospital Stay (HOSPITAL_BASED_OUTPATIENT_CLINIC_OR_DEPARTMENT_OTHER): Payer: Medicare Other | Admitting: Oncology

## 2019-10-04 ENCOUNTER — Encounter: Payer: Self-pay | Admitting: Oncology

## 2019-10-04 ENCOUNTER — Other Ambulatory Visit: Payer: Self-pay

## 2019-10-04 VITALS — BP 126/74 | HR 71 | Temp 98.1°F | Resp 18 | Wt 218.7 lb

## 2019-10-04 DIAGNOSIS — Z86711 Personal history of pulmonary embolism: Secondary | ICD-10-CM

## 2019-10-04 DIAGNOSIS — D72820 Lymphocytosis (symptomatic): Secondary | ICD-10-CM

## 2019-10-04 DIAGNOSIS — Z86718 Personal history of other venous thrombosis and embolism: Secondary | ICD-10-CM | POA: Diagnosis not present

## 2019-10-04 DIAGNOSIS — Z1589 Genetic susceptibility to other disease: Secondary | ICD-10-CM | POA: Insufficient documentation

## 2019-10-04 DIAGNOSIS — M069 Rheumatoid arthritis, unspecified: Secondary | ICD-10-CM | POA: Diagnosis not present

## 2019-10-04 DIAGNOSIS — D45 Polycythemia vera: Secondary | ICD-10-CM | POA: Diagnosis not present

## 2019-10-04 DIAGNOSIS — Z7982 Long term (current) use of aspirin: Secondary | ICD-10-CM | POA: Diagnosis not present

## 2019-10-04 DIAGNOSIS — D72829 Elevated white blood cell count, unspecified: Secondary | ICD-10-CM | POA: Diagnosis not present

## 2019-10-04 DIAGNOSIS — Z7189 Other specified counseling: Secondary | ICD-10-CM | POA: Insufficient documentation

## 2019-10-04 DIAGNOSIS — Z7901 Long term (current) use of anticoagulants: Secondary | ICD-10-CM | POA: Diagnosis not present

## 2019-10-04 DIAGNOSIS — R42 Dizziness and giddiness: Secondary | ICD-10-CM | POA: Diagnosis not present

## 2019-10-04 DIAGNOSIS — D473 Essential (hemorrhagic) thrombocythemia: Secondary | ICD-10-CM | POA: Diagnosis not present

## 2019-10-04 NOTE — Progress Notes (Signed)
Hematology/Oncology follow up note [facility] Telephone:([phone]) [phone] [phone]   Patient Care Team: Birdie Sons, MD as PCP - General (Family Medicine) Lorelee Cover., MD as Consulting Physician (Ophthalmology) Quintin Alto, MD as Referring Physician (Rheumatology) Dorene Ar, MD (Pain Medicine) Ubaldo Glassing Javier Docker, MD as Consulting Physician (Cardiology) Earlie Server, MD as Consulting Physician (Oncology)  REFERRING PROVIDER: Birdie Sons, MD  CHIEF COMPLAINTS/REASON FOR VISIT:  Evaluation of polycytosis  HISTORY OF PRESENTING ILLNESS:   Edward Mejia is a  74 y.o.  male with PMH including rheumatoid arthritis, peripheral vascular disease hypertension, Covid infection history,, was seen in consultation at the request of  Caryn Section, Kirstie Peri, MD  for evaluation of polycytosis Patient was accompanied by her wife who participated in providing medical history. February 2021, patient was hospitalized due to COVID-19 pneumonia, respiratory failure, treated with steroids, remdesivir, received bamlanivimab.  Hospitalization was also completed by [facility], bleeding was  thought to be secondary to hemorrhoids.  He was discharged on 04/07/2019 and presented back to emergency room on 04/12/2019 due to left lower extremity swelling, acute hypoxic respiratory failure.  Work-up showed acute bilateral pulmonary embolism, large left lower extremity DVT, thrombosis and IVC and left external iliac vein.  Patient was started on anticoagulation and transition to Eliquis Patient has been on Eliquis since March 2021.  Patient also takes aspirin.  Patient has history of CAD, follows up with Dr. Ubaldo Glassing.  Rheumatoid arthritis on Enbrel once a week.  Also on prednisone 7.5 mg daily. Patient has also been found to have erythrocytosis. 09/20/2019, CBC showed hemoglobin of 17.6, reviewed previous medical records, erythrocytosis can be traced back to at least 2017.  Patient also has  leukocytosis since March 2021.  Leukocytosis has trended down recently and CBC on 09/20/2019 showed a white count of 10.8, predominantly monocytosis.  Patient is a former smoker, 10-12-pack-year smoking history and he stopped smoking in 1984. Currently he has some chronic shortness of breath with exertion.  He has lost weight during his previous hospitalization and has gained weight since discharge.  Sometimes he feels sweaty.  Patient also reports intermittent dizziness/lightheaded episodes for the past couple of months.  He actually had an episode during today's visit and is spontaneously resolved after few minutes.  Denies any shortness of breath, chest pain, vision changes, focal deficits.  Patient has been referred to establish care with neurology for further evaluation.  INTERVAL HISTORY Edward Mejia is a 74 y.o. male who has above history reviewed by me today presents for follow up visit to discuss blood work up results.  Patient has had phlebotomy and removed 300 cc during interval.  Patient reports that his dizziness symptom has completely resolved after phlebotomy.  He feels very well today. No new complaints.  Review of Systems  Constitutional: Negative for appetite change, chills, fatigue and fever.  HENT:   Negative for hearing loss and voice change.   Eyes: Negative for eye problems.  Respiratory: Negative for chest tightness and cough.   Cardiovascular: Negative for chest pain.  Gastrointestinal: Negative for abdominal distention, abdominal pain and blood in stool.  Endocrine: Negative for hot flashes.  Genitourinary: Negative for difficulty urinating and frequency.   Musculoskeletal: Negative for arthralgias.  Skin: Negative for itching and rash.  Neurological: Negative for extremity weakness and light-headedness.  Hematological: Negative for adenopathy.  Psychiatric/Behavioral: Negative for confusion.    MEDICAL HISTORY:  Past Medical History:  Diagnosis Date  .  Arthritis   .  Bilateral pulmonary embolism ([place]) 03/2019   in setting of Covid-19 pneumonia  . Cancer ([place])    skin cancer  . Cellulitis   . Cellulitis   . DVT (deep venous thrombosis) ([place]) 03/2019   in setting of Covid-19 pneumonia  . Erythrocytosis 09/20/2019  . H/O adenomatous polyp of colon 03/24/2015  . History of brain disorder: history of amaurosis fugax 03/24/2015  . History of pneumonia 04/04/2008  . History of rheumatic fever 03/24/2015   DID Have Rheumatic Fever.    . Hypertension   . Peptic ulcer disease   . Sepsis ([place]) 04/12/2019   secondary to Covid pneumonia    SURGICAL HISTORY: Past Surgical History:  Procedure Laterality Date  . APPENDECTOMY    . LEFT HEART CATH AND CORONARY ANGIOGRAPHY Left 02/06/2018   Procedure: LEFT HEART CATH AND CORONARY ANGIOGRAPHY;  Surgeon: Teodoro Spray, MD;  Location: [facility];  Service: Cardiovascular;  Laterality: Left;  Marland Kitchen MANDIBLE FRACTURE SURGERY     fractured jaw  . PARTIAL GASTRECTOMY     peptic ulcer disease    SOCIAL HISTORY: Social History   Socioeconomic History  . Marital status: Married    Spouse name: Lovey Newcomer   . Number of children: 4  . Years of education: Not on file  . Highest education level: 8th grade  Occupational History  . Occupation: Disabled    Comment: due to Psoriatic Arthritis  Tobacco Use  . Smoking status: Former Smoker    Quit date: 09/20/1982    Years since quitting: 37.0  . Smokeless tobacco: Never Used  . Tobacco comment: quit over 40+ years ago  Vaping Use  . Vaping Use: Never used  Substance and Sexual Activity  . Alcohol use: No    Alcohol/week: 0.0 standard drinks  . Drug use: No  . Sexual activity: Not on file  Other Topics Concern  . Not on file  Social History Narrative  . Not on file   Social Determinants of Health   Financial Resource Strain: Low Risk   . Difficulty of Paying Living Expenses: Not hard at all  Food Insecurity:   . Worried About Ship broker in the Last Year: Not on file  . Ran Out of Food in the Last Year: Not on file  Transportation Needs:   . Lack of Transportation (Medical): Not on file  . Lack of Transportation (Non-Medical): Not on file  Physical Activity: Unknown  . Days of Exercise per Week: Not on file  . Minutes of Exercise per Session: 0 min  Stress: No Stress Concern Present  . Feeling of Stress : Not at all  Social Connections: Unknown  . Frequency of Communication with Friends and Family: Patient refused  . Frequency of Social Gatherings with Friends and Family: Patient refused  . Attends Religious Services: Patient refused  . Active Member of Clubs or Organizations: Patient refused  . Attends Archivist Meetings: Patient refused  . Marital Status: Patient refused  Intimate Partner Violence: Unknown  . Fear of Current or Ex-Partner: Patient refused  . Emotionally Abused: Patient refused  . Physically Abused: Patient refused  . Sexually Abused: Patient refused    FAMILY HISTORY: Family History  Problem Relation Age of Onset  . Cancer Mother        lung cancer  . Heart disease Father 15       heart attack  . Cancer Brother        pancreatic cancer  .  Cancer Brother        pancreatic cancer    ALLERGIES:  is allergic to pravachol [pravastatin].  MEDICATIONS:  Current Outpatient Medications  Medication Sig Dispense Refill  . acetaminophen (TYLENOL) 500 MG tablet Take 1,000 mg by mouth 3 (three) times daily as needed for mild pain or headache.     . albuterol (PROVENTIL HFA) 108 (90 Base) MCG/ACT inhaler Inhale 2 puffs into the lungs every 6 (six) hours as needed for wheezing or shortness of breath.    Marland Kitchen apixaban (ELIQUIS) 5 MG TABS tablet Take 1 tablet (5 mg total) by mouth 2 (two) times daily. 60 tablet 4  . aspirin EC 81 MG tablet Take 81 mg by mouth daily. Swallow whole.    . etanercept (ENBREL) 50 MG/ML injection Inject 50 mg into the skin every Thursday.     . gabapentin  (NEURONTIN) 300 MG capsule Take 900 mg by mouth See admin instructions. Take 600 mg by mouth once daily at 4 PM    . HYDROcodone-acetaminophen (NORCO) 7.5-325 MG tablet Take 1 tablet by mouth 3 (three) times daily as needed for moderate pain. (Patient taking differently: Take 1 tablet by mouth every 6 (six) hours. ) 90 tablet 0  . hydrocortisone 2.5 % cream Apply topically 2 (two) times daily. As needed 30 g 3  . isosorbide mononitrate (IMDUR) 120 MG 24 hr tablet Take 120 mg by mouth daily.     . predniSONE (DELTASONE) 5 MG tablet Take 7.5 mg by mouth daily with lunch.     . ranolazine (RANEXA) 500 MG 12 hr tablet Take 500 mg by mouth in the morning and at bedtime.    . valsartan-hydrochlorothiazide (DIOVAN-HCT) 160-12.5 MG tablet Take 1 tablet by mouth daily. 90 tablet 3  . nitroGLYCERIN (NITROSTAT) 0.4 MG SL tablet Place 0.4 mg under the tongue every 5 (five) minutes as needed for chest pain. (Patient not taking: Reported on 10/04/2019)     No current facility-administered medications for this visit.     PHYSICAL EXAMINATION: ECOG PERFORMANCE STATUS: 1 - Symptomatic but completely ambulatory See Epics.  . Physical Exam Constitutional:      General: He is not in acute distress. HENT:     Head: Normocephalic and atraumatic.  Eyes:     General: No scleral icterus. Cardiovascular:     Rate and Rhythm: Normal rate and regular rhythm.     Heart sounds: Normal heart sounds.  Pulmonary:     Effort: Pulmonary effort is normal. No respiratory distress.     Breath sounds: No wheezing.  Abdominal:     General: Bowel sounds are normal. There is no distension.     Palpations: Abdomen is soft.  Musculoskeletal:        General: No deformity. Normal range of motion.     Cervical back: Normal range of motion and neck supple.  Skin:    General: Skin is warm and dry.     Findings: No erythema or rash.  Neurological:     Mental Status: He is alert and oriented to person, place, and time. Mental  status is at baseline.     Cranial Nerves: No cranial nerve deficit.     Coordination: Coordination normal.  Psychiatric:        Mood and Affect: Mood normal.     LABORATORY DATA:  I have reviewed the data as listed Lab Results  Component Value Date   WBC 10.8 (H) 09/20/2019   HGB 17.6 (H) 09/20/2019  HCT 53.2 (H) 09/20/2019   MCV 81.8 09/20/2019   PLT 459 (H) 09/20/2019   Recent Labs    04/12/19 1045 04/13/19 0418 04/16/19 0243 04/16/19 0243 04/26/19 1545 09/16/19 1058 09/17/19 1049  NA 131*   < > 134*  --  133*  --  133*  K 5.1   < > 4.3  --  4.8  --  4.6  CL 101   < > 95*  --  95*  --  93*  CO2 16*   < > 26  --  23  --  23  GLUCOSE 102*   < > 75  --  125*  --  61*  BUN 17   < > 18  --  10  --  12  CREATININE 0.91   < > 1.05   < > 1.17 1.30* 1.23  CALCIUM 8.0*   < > 7.9*  --  8.4*  --  9.1  GFRNONAA >60   < > >60  --  61  --  57*  GFRAA >60   < > >60  --  71  --  66  PROT 5.9*   < > 5.0*  --  5.7*  --  6.2  ALBUMIN 2.6*   < > 2.4*  --  3.5*  --  4.2  AST 29   < > 26  --  22  --  14  ALT 23   < > 32  --  17  --  13  ALKPHOS 49   < > 47  --  79  --  51  BILITOT 2.6*   < > 1.5*  --  1.3*  --  1.0  BILIDIR 0.4*  --   --   --   --   --   --   IBILI 2.2*  --   --   --   --   --   --    < > = values in this interval not displayed.   Iron/TIBC/Ferritin/ %Sat    Component Value Date/Time   IRON 49 09/20/2019 1201   TIBC 318 09/20/2019 1201   FERRITIN 24 09/20/2019 1201   IRONPCTSAT 15 (L) 09/20/2019 1201      RADIOGRAPHIC STUDIES: I have personally reviewed the radiological images as listed and agreed with the findings in the report. CT ANGIO CHEST PE W OR WO CONTRAST  Result Date: 09/16/2019 CLINICAL DATA:  Chest pain and shortness of breath EXAM: CT ANGIOGRAPHY CHEST WITH CONTRAST TECHNIQUE: Multidetector CT imaging of the chest was performed using the standard protocol during bolus administration of intravenous contrast. Multiplanar CT image reconstructions  and MIPs were obtained to evaluate the vascular anatomy. CONTRAST:  58m OMNIPAQUE IOHEXOL 350 MG/ML SOLN COMPARISON:  Jun 12, 2019 CT angiogram chest. FINDINGS: Cardiovascular: Since prior study, there has been interval resolution of pulmonary embolus. No pulmonary emboli evident currently. There is no appreciable thoracic aortic aneurysm. No dissection evident. Note that the contrast bolus within the aorta is not sufficient for confident dissection assessment. There are scattered foci of calcification in visualized great vessels. There are foci of aortic atherosclerosis. There are multiple foci of coronary artery calcification. There is no pericardial effusion or pericardial thickening. Mediastinum/Nodes: Thyroid appears unremarkable. There is no appreciable thoracic adenopathy. No esophageal lesions are appreciable. Lungs/Pleura: There are multiple scattered foci of airspace opacity bilaterally with overall partial but incomplete clearing of areas of consolidation at multiple sites in the upper and lower lobes. Scattered areas  of atelectatic change, primarily in the bases, also present. No new opacities are evident. No pleural effusions. Upper Abdomen: Postoperative changes and arterial vascular calcification noted in upper abdomen. Visualized upper abdominal structures otherwise appear unremarkable. Musculoskeletal: There are foci of degenerative change in the thoracic spine. There are no blastic or lytic bone lesions. No chest wall lesions appreciable. Review of the MIP images confirms the above findings. IMPRESSION: 1. Previous pulmonary emboli bilaterally have resolved since prior study. Currently no evidence of pulmonary embolus. 2. No thoracic aortic aneurysm. No dissection evident; note that contrast bolus in the aorta is not sufficient for confident dissection assessment. There are foci of aortic atherosclerosis as well as foci of great vessel and coronary artery calcification. 3. Airspace opacity  bilaterally, less pronounced than on previous study. Residua from atypical organism pneumonia suspected. Scattered areas of atelectasis also present. 4.  No adenopathy evident. Aortic Atherosclerosis (ICD10-I70.0). Electronically Signed   By: Lowella Grip III M.D.   On: 09/16/2019 11:18      ASSESSMENT & PLAN:  1. Polycythemia vera ([place])   2. JAK2 V617F mutation   3. Goals of care, counseling/discussion   4. History of pulmonary embolism   5. History of DVT of lower extremity   6. Monoclonal B-cell lymphocytosis of undetermined significance    #Polycythemia vera,  JAK2 V617F mutation. Labs reviewed and discussed with patient. Diagnosis of myeloproliferative disease was discussed with patient.  Most likely he has polycythemia vera.  Erythrocytosis, recommend phlebotomy as needed.  Goal of hematocrit is less than 45 Continue aspirin 81 mg Recommend patient to proceed with bone marrow biopsy for further evaluation confirmed polycythemia vera.  Rule out myelofibrosis.  No hepatosplenomegaly on physical examination. Consider cytoreductive therapy after bone marrow confirms diagnosis.   History of acute bilateral pulmonary embolism.  Acute lower extremity thrombosis. Finished 6 months of Eliquis 5 mg twice daily.  This is most likely due to polycythemia vera, COVID-19 infection may also attribute to diet.  I recommend long-term anticoagulation.  After he finishes Eliquis 5 mg twice daily, will recommend anticoagulation prophylaxis. Marland Kitchen  #Intermittent lightheaded/dizziness, symptom has dramatically improved after 1 phlebotomy.  Likely secondary to erythrocytosis.  #Flow cytometry also showed monoclonal lymphocytosis of unknown significance.  Continue observation.  Orders Placed This Encounter  Procedures  . CT BONE MARROW BIOPSY & ASPIRATION    Standing Status:   Future    Standing Expiration Date:   10/03/2020    Order Specific Question:   Reason for Exam (SYMPTOM  OR DIAGNOSIS  REQUIRED)    Answer:   polycythemia, Jak 2 positive    Order Specific Question:   Preferred location?    Answer:   [facility]    Order Specific Question:   Radiology Contrast Protocol - do NOT remove file path    Answer:   [email or web address]  . Hemoglobin and hematocrit, blood    Standing Status:   Future    Standing Expiration Date:   10/03/2020    All questions were answered. The patient knows to call the clinic with any problems questions or concerns.  cc Birdie Sons, MD    Return of visit: H&H next week +/- Phebotomy, Bone marrow biopsy and follow-up in 2 weeks after bone marrow biopsy.   Earlie Server, MD, PhD Hematology Oncology [facility] Pager- [phone] 10/04/2019

## 2019-10-04 NOTE — Progress Notes (Signed)
Pt here for follow up. No new concerns voiced.   

## 2019-10-07 ENCOUNTER — Other Ambulatory Visit: Payer: Self-pay | Admitting: Family Medicine

## 2019-10-07 ENCOUNTER — Telehealth: Payer: Self-pay

## 2019-10-07 DIAGNOSIS — D45 Polycythemia vera: Secondary | ICD-10-CM

## 2019-10-07 DIAGNOSIS — Z1589 Genetic susceptibility to other disease: Secondary | ICD-10-CM

## 2019-10-07 NOTE — Telephone Encounter (Signed)
Scheduled for bone marrow biopsy on  10/16/19 @ 8:30 to arrive at 7:30a.  The IR nurse will call him prior to the procedure to go over the instructions.    Please schedule him for MD follow up 2 weeks after Bone marrow biopsy for lab/MD/phlebotomy (H&H) and inform patient of appts please. He is aware that he will get a call with biopsy and follow up appts.   

## 2019-10-07 NOTE — Telephone Encounter (Signed)
Done.. Appt has been sched as requested.Marland Kitchen Pts wife [name] was made aware

## 2019-10-11 ENCOUNTER — Inpatient Hospital Stay: Payer: Medicare Other | Attending: Oncology

## 2019-10-11 ENCOUNTER — Other Ambulatory Visit: Payer: Self-pay

## 2019-10-11 ENCOUNTER — Inpatient Hospital Stay: Payer: Medicare Other

## 2019-10-11 VITALS — BP 151/80 | HR 73 | Temp 97.0°F | Resp 18

## 2019-10-11 DIAGNOSIS — D72820 Lymphocytosis (symptomatic): Secondary | ICD-10-CM | POA: Insufficient documentation

## 2019-10-11 DIAGNOSIS — Z86711 Personal history of pulmonary embolism: Secondary | ICD-10-CM | POA: Diagnosis not present

## 2019-10-11 DIAGNOSIS — Z7901 Long term (current) use of anticoagulants: Secondary | ICD-10-CM | POA: Diagnosis not present

## 2019-10-11 DIAGNOSIS — Z86718 Personal history of other venous thrombosis and embolism: Secondary | ICD-10-CM | POA: Insufficient documentation

## 2019-10-11 DIAGNOSIS — D45 Polycythemia vera: Secondary | ICD-10-CM

## 2019-10-11 DIAGNOSIS — D751 Secondary polycythemia: Secondary | ICD-10-CM

## 2019-10-11 LAB — HEMOGLOBIN AND HEMATOCRIT, BLOOD
HCT: 52.7 % — ABNORMAL HIGH (ref 39.0–52.0)
Hemoglobin: 17.3 g/dL — ABNORMAL HIGH (ref 13.0–17.0)

## 2019-10-14 ENCOUNTER — Telehealth: Payer: Self-pay | Admitting: Family Medicine

## 2019-10-14 DIAGNOSIS — I824Z2 Acute embolism and thrombosis of unspecified deep veins of left distal lower extremity: Secondary | ICD-10-CM

## 2019-10-14 NOTE — Telephone Encounter (Signed)
Please advise patient it's time to schedule follow up colonoscopy due to pre-cancerous polyp in 2015. Have placed referral order. He is supposed to stop Eliquis this month. Refill was for Eliquis was sent to [facility] by [facility] last week, but is supposed to be finished with it. Please cancel all refills for Eliquis at [facility]. thanks .

## 2019-10-15 DIAGNOSIS — I739 Peripheral vascular disease, unspecified: Secondary | ICD-10-CM | POA: Diagnosis not present

## 2019-10-15 DIAGNOSIS — I25119 Atherosclerotic heart disease of native coronary artery with unspecified angina pectoris: Secondary | ICD-10-CM | POA: Diagnosis not present

## 2019-10-15 DIAGNOSIS — Z23 Encounter for immunization: Secondary | ICD-10-CM | POA: Diagnosis not present

## 2019-10-15 DIAGNOSIS — I2699 Other pulmonary embolism without acute cor pulmonale: Secondary | ICD-10-CM | POA: Diagnosis not present

## 2019-10-15 NOTE — Telephone Encounter (Signed)
Spoke with patient's wife to advise as below. She verbally understands and will advise pt. She also reports that he saw the hematologist last week and they recommended that he continue the Eliquis for now. He is producing too much blood and is at risk for clots. She says that this may change, but for now he is to continue the Eliquis.

## 2019-10-16 ENCOUNTER — Ambulatory Visit
Admission: RE | Admit: 2019-10-16 | Discharge: 2019-10-16 | Disposition: A | Discharge status: Home / Self Care | Payer: Medicare Other | Source: Ambulatory Visit | Attending: Oncology | Admitting: Oncology

## 2019-10-16 ENCOUNTER — Other Ambulatory Visit: Payer: Self-pay

## 2019-10-16 ENCOUNTER — Encounter: Payer: Self-pay | Admitting: *Deleted

## 2019-10-16 DIAGNOSIS — Z7982 Long term (current) use of aspirin: Secondary | ICD-10-CM | POA: Insufficient documentation

## 2019-10-16 DIAGNOSIS — M069 Rheumatoid arthritis, unspecified: Secondary | ICD-10-CM | POA: Insufficient documentation

## 2019-10-16 DIAGNOSIS — Z7902 Long term (current) use of antithrombotics/antiplatelets: Secondary | ICD-10-CM | POA: Diagnosis not present

## 2019-10-16 DIAGNOSIS — Z8616 Personal history of COVID-19: Secondary | ICD-10-CM | POA: Insufficient documentation

## 2019-10-16 DIAGNOSIS — Z86711 Personal history of pulmonary embolism: Secondary | ICD-10-CM | POA: Diagnosis not present

## 2019-10-16 DIAGNOSIS — Z7952 Long term (current) use of systemic steroids: Secondary | ICD-10-CM | POA: Diagnosis not present

## 2019-10-16 DIAGNOSIS — Z87891 Personal history of nicotine dependence: Secondary | ICD-10-CM | POA: Insufficient documentation

## 2019-10-16 DIAGNOSIS — D473 Essential (hemorrhagic) thrombocythemia: Secondary | ICD-10-CM | POA: Insufficient documentation

## 2019-10-16 DIAGNOSIS — Z79899 Other long term (current) drug therapy: Secondary | ICD-10-CM | POA: Diagnosis not present

## 2019-10-16 DIAGNOSIS — E282 Polycystic ovarian syndrome: Secondary | ICD-10-CM | POA: Diagnosis not present

## 2019-10-16 DIAGNOSIS — D72829 Elevated white blood cell count, unspecified: Secondary | ICD-10-CM | POA: Diagnosis not present

## 2019-10-16 DIAGNOSIS — D751 Secondary polycythemia: Secondary | ICD-10-CM | POA: Diagnosis not present

## 2019-10-16 DIAGNOSIS — Q613 Polycystic kidney, unspecified: Secondary | ICD-10-CM | POA: Diagnosis not present

## 2019-10-16 DIAGNOSIS — Z8701 Personal history of pneumonia (recurrent): Secondary | ICD-10-CM | POA: Insufficient documentation

## 2019-10-16 DIAGNOSIS — D45 Polycythemia vera: Secondary | ICD-10-CM | POA: Diagnosis not present

## 2019-10-16 LAB — CBC WITH DIFFERENTIAL/PLATELET
Abs Immature Granulocytes: 0.24 10*3/uL — ABNORMAL HIGH (ref 0.00–0.07)
Basophils Absolute: 0.1 10*3/uL (ref 0.0–0.1)
Basophils Relative: 1 %
Eosinophils Absolute: 0.1 10*3/uL (ref 0.0–0.5)
Eosinophils Relative: 1 %
HCT: 51.9 % (ref 39.0–52.0)
Hemoglobin: 17.1 g/dL — ABNORMAL HIGH (ref 13.0–17.0)
Immature Granulocytes: 2 %
Lymphocytes Relative: 25 %
Lymphs Abs: 2.9 10*3/uL (ref 0.7–4.0)
MCH: 25.9 pg — ABNORMAL LOW (ref 26.0–34.0)
MCHC: 32.9 g/dL (ref 30.0–36.0)
MCV: 78.8 fL — ABNORMAL LOW (ref 80.0–100.0)
Monocytes Absolute: 1.1 10*3/uL — ABNORMAL HIGH (ref 0.1–1.0)
Monocytes Relative: 10 %
Neutro Abs: 6.9 10*3/uL (ref 1.7–7.7)
Neutrophils Relative %: 61 %
Platelets: 528 10*3/uL — ABNORMAL HIGH (ref 150–400)
RBC: 6.59 MIL/uL — ABNORMAL HIGH (ref 4.22–5.81)
RDW: 17.8 % — ABNORMAL HIGH (ref 11.5–15.5)
WBC: 11.3 10*3/uL — ABNORMAL HIGH (ref 4.0–10.5)
nRBC: 0 % (ref 0.0–0.2)

## 2019-10-16 MED ORDER — MIDAZOLAM HCL 2 MG/2ML IJ SOLN
INTRAMUSCULAR | Status: AC
  Filled 2019-10-16: qty 2

## 2019-10-16 MED ORDER — HEPARIN SOD (PORK) LOCK FLUSH 100 UNIT/ML IV SOLN
INTRAVENOUS | Status: AC
  Filled 2019-10-16: qty 5

## 2019-10-16 MED ORDER — FENTANYL CITRATE (PF) 100 MCG/2ML IJ SOLN
INTRAMUSCULAR | Status: AC | PRN
  Administered 2019-10-16 (×2): 50 ug via INTRAVENOUS

## 2019-10-16 MED ORDER — MIDAZOLAM HCL 2 MG/2ML IJ SOLN
INTRAMUSCULAR | Status: AC | PRN
  Administered 2019-10-16 (×2): 1 mg via INTRAVENOUS

## 2019-10-16 MED ORDER — SODIUM CHLORIDE 0.9 % IV SOLN: INTRAVENOUS | Status: DC

## 2019-10-16 MED ORDER — FENTANYL CITRATE (PF) 100 MCG/2ML IJ SOLN
INTRAMUSCULAR | Status: AC
Start: 2019-10-16 — End: 2019-10-16
  Filled 2019-10-16: qty 2

## 2019-10-16 NOTE — Procedures (Signed)
Pre-procedure Diagnosis: Polycytosis Post-procedure Diagnosis: Same  Technically successful CT guided bone marrow aspiration and biopsy of left iliac crest.   Complications: None Immediate  EBL: None  Signed: Sandi Mariscal Pager: [phone] 10/16/2019, 9:13 AM

## 2019-10-16 NOTE — Discharge Instructions (Signed)
Moderate Conscious Sedation, Adult, Care After These instructions provide you with information about caring for yourself after your procedure. Your health care provider may also give you more specific instructions. Your treatment has been planned according to current medical practices, but problems sometimes occur. Call your health care provider if you have any problems or questions after your procedure. What can I expect after the procedure? After your procedure, it is common:  To feel sleepy for several hours.  To feel clumsy and have poor balance for several hours.  To have poor judgment for several hours.  To vomit if you eat too soon. Follow these instructions at home: For at least 24 hours after the procedure:   Do not: ? Participate in activities where you could fall or become injured. ? Drive. ? Use heavy machinery. ? Drink alcohol. ? Take sleeping pills or medicines that cause drowsiness. ? Make important decisions or sign legal documents. ? Take care of children on your own.  Rest. Eating and drinking  Follow the diet recommended by your health care provider.  If you vomit: ? Drink water, juice, or soup when you can drink without vomiting. ? Make sure you have little or no nausea before eating solid foods. General instructions  Have a responsible adult stay with you until you are awake and alert.  Take over-the-counter and prescription medicines only as told by your health care provider.  If you smoke, do not smoke without supervision.  Keep all follow-up visits as told by your health care provider. This is important. Contact a health care provider if:  You keep feeling nauseous or you keep vomiting.  You feel light-headed.  You develop a rash.  You have a fever. Get help right away if:  You have trouble breathing. This information is not intended to replace advice given to you by your health care provider. Make sure you discuss any questions you have  with your health care provider. Document Revised: 01/06/2017 Document Reviewed: 05/16/2015 Elsevier Patient Education  2020 [place]. Bone Marrow Aspiration and Bone Marrow Biopsy, Adult, Care After This sheet gives you information about how to care for yourself after your procedure. Your health care provider may also give you more specific instructions. If you have problems or questions, contact your health care provider. What can I expect after the procedure? After the procedure, it is common to have:  Mild pain and tenderness.  Swelling.  Bruising. Follow these instructions at home: Puncture site care   Follow instructions from your health care provider about how to take care of the puncture site. Make sure you: ? Wash your hands with soap and water before and after you change your bandage (dressing). If soap and water are not available, use hand sanitizer. ? Change your dressing as told by your health care provider.  Check your puncture site every day for signs of infection. Check for: ? More redness, swelling, or pain. ? Fluid or blood. ? Warmth. ? Pus or a bad smell. Activity  Return to your normal activities as told by your health care provider. Ask your health care provider what activities are safe for you.  Do not lift anything that is heavier than 10 lb (4.5 kg), or the limit that you are told, until your health care provider says that it is safe.  Do not drive for 24 hours if you were given a sedative during your procedure. General instructions   Take over-the-counter and prescription medicines only as told by your  health care provider.  Do not take baths, swim, or use a hot tub until your health care provider approves. Ask your health care provider if you may take showers. You may only be allowed to take sponge baths.  If directed, put ice on the affected area. To do this: ? Put ice in a plastic bag. ? Place a towel between your skin and the bag. ? Leave  the ice on for 20 minutes, 2-3 times a day.  Keep all follow-up visits as told by your health care provider. This is important. Contact a health care provider if:  Your pain is not controlled with medicine.  You have a fever.  You have more redness, swelling, or pain around the puncture site.  You have fluid or blood coming from the puncture site.  Your puncture site feels warm to the touch.  You have pus or a bad smell coming from the puncture site. Summary  After the procedure, it is common to have mild pain, tenderness, swelling, and bruising.  Follow instructions from your health care provider about how to take care of the puncture site and what activities are safe for you.  Take over-the-counter and prescription medicines only as told by your health care provider.  Contact a health care provider if you have any signs of infection, such as fluid or blood coming from the puncture site. This information is not intended to replace advice given to you by your health care provider. Make sure you discuss any questions you have with your health care provider. Document Revised: 06/12/2018 Document Reviewed: 06/12/2018 Elsevier Patient Education  [place].

## 2019-10-16 NOTE — Consult Note (Signed)
Chief Complaint: Polycytosis  Referring Physician(s): Yu,Zhou  Patient Status: [facility] - Out-pt  History of Present Illness: Edward Mejia is a 74 y.o. male with past medical history significant for pulmonary embolism in the setting of Covid pneumonia and rheumatoid arthritis who presents today for CT-guided bone marrow biopsy for evaluation of polycytosis.  The patient is accompanied by his wife though contributes to his history.  The patient is currently without complaint.  Specifically, no chest pain, shortness of breath in appetite or energy level.  No fever or chills.   Past Medical History:  Diagnosis Date  . Arthritis   . Bilateral pulmonary embolism ([place]) 03/2019   in setting of Covid-19 pneumonia  . Cancer ([place])    skin cancer  . Cellulitis   . Cellulitis   . DVT (deep venous thrombosis) ([place]) 03/2019   in setting of Covid-19 pneumonia  . Erythrocytosis 09/20/2019  . H/O adenomatous polyp of colon 03/24/2015  . History of brain disorder: history of amaurosis fugax 03/24/2015  . History of pneumonia 04/04/2008  . History of rheumatic fever 03/24/2015   DID Have Rheumatic Fever.    . Hypertension   . Peptic ulcer disease   . Sepsis ([place]) 04/12/2019   secondary to Covid pneumonia    Past Surgical History:  Procedure Laterality Date  . APPENDECTOMY    . LEFT HEART CATH AND CORONARY ANGIOGRAPHY Left 02/06/2018   Procedure: LEFT HEART CATH AND CORONARY ANGIOGRAPHY;  Surgeon: Teodoro Spray, MD;  Location: [facility];  Service: Cardiovascular;  Laterality: Left;  Marland Kitchen MANDIBLE FRACTURE SURGERY     fractured jaw  . PARTIAL GASTRECTOMY     peptic ulcer disease    Allergies: Pravachol [pravastatin]  Medications: Prior to Admission medications   Medication Sig Start Date End Date Taking? Authorizing Provider  acetaminophen (TYLENOL) 500 MG tablet Take 1,000 mg by mouth 3 (three) times daily as needed for mild pain or headache.    Yes [provider]  albuterol (PROVENTIL HFA) 108 (90 Base) MCG/ACT inhaler Inhale 2 puffs into the lungs every 6 (six) hours as needed for wheezing or shortness of breath.   Yes [provider]  aspirin EC 81 MG tablet Take 81 mg by mouth daily. Swallow whole.   Yes [provider]  ELIQUIS 5 MG TABS tablet TAKE 1 TABLET(5 MG) BY MOUTH TWICE DAILY 10/07/19  Yes Birdie Sons, MD  etanercept (ENBREL) 50 MG/ML injection Inject 50 mg into the skin every Thursday.    Yes [provider]  gabapentin (NEURONTIN) 300 MG capsule Take 900 mg by mouth See admin instructions. Take 600 mg by mouth once daily at 4 PM 10/02/18  Yes [provider]  HYDROcodone-acetaminophen (NORCO) 7.5-325 MG tablet Take 1 tablet by mouth 3 (three) times daily as needed for moderate pain. Patient taking differently: Take 1 tablet by mouth every 6 (six) hours.  06/04/18  Yes Birdie Sons, MD  hydrocortisone 2.5 % cream Apply topically 2 (two) times daily. As needed 09/17/19  Yes Birdie Sons, MD  isosorbide mononitrate (IMDUR) 120 MG 24 hr tablet Take 120 mg by mouth daily.  09/24/18  Yes [provider]  predniSONE (DELTASONE) 5 MG tablet Take 7.5 mg by mouth daily with lunch.    Yes [provider]  ranolazine (RANEXA) 500 MG 12 hr tablet Take 500 mg by mouth in the morning and at bedtime. 03/18/19  Yes [provider]  valsartan-hydrochlorothiazide (DIOVAN-HCT)  160-12.5 MG tablet Take 1 tablet by mouth daily. 06/17/19  Yes Malva Limes, MD  nitroGLYCERIN (NITROSTAT) 0.4 MG SL tablet Place 0.4 mg under the tongue every 5 (five) minutes as needed for chest pain. Patient not taking: Reported on 10/04/2019    [provider]     Family History  Problem Relation Age of Onset  . Cancer Mother        lung cancer  . Heart disease Father 50       heart attack  . Cancer Brother        pancreatic cancer  . Cancer Brother        pancreatic cancer     Social History   Socioeconomic History  . Marital status: Married    Spouse name: Andrey Campanile   . Number of children: 4  . Years of education: Not on file  . Highest education level: 8th grade  Occupational History  . Occupation: Disabled    Comment: due to Psoriatic Arthritis  Tobacco Use  . Smoking status: Former Smoker    Quit date: 09/20/1982    Years since quitting: 37.0  . Smokeless tobacco: Never Used  . Tobacco comment: quit over 40+ years ago  Vaping Use  . Vaping Use: Never used  Substance and Sexual Activity  . Alcohol use: No    Alcohol/week: 0.0 standard drinks  . Drug use: No  . Sexual activity: Not on file  Other Topics Concern  . Not on file  Social History Narrative  . Not on file   Social Determinants of Health   Financial Resource Strain: Low Risk   . Difficulty of Paying Living Expenses: Not hard at all  Food Insecurity:   . Worried About Programme researcher, broadcasting/film/video in the Last Year: Not on file  . Ran Out of Food in the Last Year: Not on file  Transportation Needs:   . Lack of Transportation (Medical): Not on file  . Lack of Transportation (Non-Medical): Not on file  Physical Activity: Unknown  . Days of Exercise per Week: Not on file  . Minutes of Exercise per Session: 0 min  Stress: No Stress Concern Present  . Feeling of Stress : Not at all  Social Connections: Unknown  . Frequency of Communication with Friends and Family: Patient refused  . Frequency of Social Gatherings with Friends and Family: Patient refused  . Attends Religious Services: Patient refused  . Active Member of Clubs or Organizations: Patient refused  . Attends Banker Meetings: Patient refused  . Marital Status: Patient refused    ECOG Status: 0 - Asymptomatic  Review of Systems: A 12 point ROS discussed and pertinent positives are indicated in the HPI above.  All other systems are negative.  Review of Systems  Vital Signs: BP 138/75   Pulse 68   Temp 97.9 F  (36.6 C) (Oral)   Resp 20   Ht 6\' 2"  (1.88 m)   Wt 97.5 kg   SpO2 98%   BMI 27.60 kg/m   Physical Exam  Imaging: CT ANGIO CHEST PE W OR WO CONTRAST  Result Date: 09/16/2019 CLINICAL DATA:  Chest pain and shortness of breath EXAM: CT ANGIOGRAPHY CHEST WITH CONTRAST TECHNIQUE: Multidetector CT imaging of the chest was performed using the standard protocol during bolus administration of intravenous contrast. Multiplanar CT image reconstructions and MIPs were obtained to evaluate the vascular anatomy. CONTRAST:  59mL OMNIPAQUE IOHEXOL 350 MG/ML SOLN COMPARISON:  Jun 12, 2019 CT  angiogram chest. FINDINGS: Cardiovascular: Since prior study, there has been interval resolution of pulmonary embolus. No pulmonary emboli evident currently. There is no appreciable thoracic aortic aneurysm. No dissection evident. Note that the contrast bolus within the aorta is not sufficient for confident dissection assessment. There are scattered foci of calcification in visualized great vessels. There are foci of aortic atherosclerosis. There are multiple foci of coronary artery calcification. There is no pericardial effusion or pericardial thickening. Mediastinum/Nodes: Thyroid appears unremarkable. There is no appreciable thoracic adenopathy. No esophageal lesions are appreciable. Lungs/Pleura: There are multiple scattered foci of airspace opacity bilaterally with overall partial but incomplete clearing of areas of consolidation at multiple sites in the upper and lower lobes. Scattered areas of atelectatic change, primarily in the bases, also present. No new opacities are evident. No pleural effusions. Upper Abdomen: Postoperative changes and arterial vascular calcification noted in upper abdomen. Visualized upper abdominal structures otherwise appear unremarkable. Musculoskeletal: There are foci of degenerative change in the thoracic spine. There are no blastic or lytic bone lesions. No chest wall lesions appreciable. Review of  the MIP images confirms the above findings. IMPRESSION: 1. Previous pulmonary emboli bilaterally have resolved since prior study. Currently no evidence of pulmonary embolus. 2. No thoracic aortic aneurysm. No dissection evident; note that contrast bolus in the aorta is not sufficient for confident dissection assessment. There are foci of aortic atherosclerosis as well as foci of great vessel and coronary artery calcification. 3. Airspace opacity bilaterally, less pronounced than on previous study. Residua from atypical organism pneumonia suspected. Scattered areas of atelectasis also present. 4.  No adenopathy evident. Aortic Atherosclerosis (ICD10-I70.0). Electronically Signed   By: Lowella Grip III M.D.   On: 09/16/2019 11:18    Labs:  CBC: Recent Labs    04/26/19 1545 04/26/19 1545 09/17/19 1049 09/20/19 1201 10/11/19 1358 10/16/19 0735  WBC 26.5*  --  12.0* 10.8*  --  11.3*  HGB 16.6   < > 17.9* 17.6* 17.3* 17.1*  HCT 49.3   < > 54.5* 53.2* 52.7* 51.9  PLT 406  --  515* 459*  --  528*   < > = values in this interval not displayed.    COAGS: Recent Labs    04/03/19 1620 04/12/19 1227  INR 1.4* 1.3*  APTT 39*  --     BMP: Recent Labs    04/15/19 0226 04/15/19 0226 04/16/19 0243 04/26/19 1545 09/16/19 1058 09/17/19 1049  NA 130*  --  134* 133*  --  133*  K 4.0  --  4.3 4.8  --  4.6  CL 94*  --  95* 95*  --  93*  CO2 23  --  26 23  --  23  GLUCOSE 113*  --  75 125*  --  61*  BUN 18  --  18 10  --  12  CALCIUM 7.5*  --  7.9* 8.4*  --  9.1  CREATININE 0.91   < > 1.05 1.17 1.30* 1.23  GFRNONAA >60  --  >60 61  --  57*  GFRAA >60  --  >60 71  --  66   < > = values in this interval not displayed.    LIVER FUNCTION TESTS: Recent Labs    04/15/19 0226 04/16/19 0243 04/26/19 1545 09/17/19 1049  BILITOT 1.2 1.5* 1.3* 1.0  AST $Re'28 26 22 14  'sYF$ ALT 31 32 17 13  ALKPHOS 55 47 79 51  PROT 5.5* 5.0* 5.7* 6.2  ALBUMIN 2.3*  2.4* 3.5* 4.2    TUMOR MARKERS: No  results for input(s): AFPTM, CEA, CA199, CHROMGRNA in the last 8760 hours.  Assessment and Plan:  Edward Mejia is a 74 y.o. male with past medical history significant for pulmonary embolism in the setting of Covid pneumonia and arthritis who presents today for CT-guided bone marrow biopsy for evaluation of polycytosis.  The patient is accompanied by his wife though contributes to his history.  The patient is currently without complaint.    Risks and benefits of CT guided bone marrow biopsy was discussed with the patient and/or patient's family including, but not limited to bleeding, infection, damage to adjacent structures or low yield requiring additional tests.  All of the questions were answered and there is agreement to proceed.  Consent signed and in chart.    Thank you for this interesting consult.  I greatly enjoyed meeting Marcello Tuzzolino and look forward to participating in their care.  A copy of this report was sent to the requesting provider on this date.  Electronically Signed: Sandi Mariscal, MD 10/16/2019, 8:32 AM   I spent a total of 15 Minutes in face to face in clinical consultation, greater than 50% of which was counseling/coordinating care for CT guided BM Bx.

## 2019-10-16 NOTE — Progress Notes (Signed)
Patient clinically stable post BMB per DR Pascal Lux, tolerated well. Vitals stable pre and post procedure. Awake/alert and oriented post procedure. Received Versed 2 mg along with Fentanyl 100 mcg IV for procedure. Report given to Johnney Ou in specials post procedure with questions answered.

## 2019-10-17 LAB — SURGICAL PATHOLOGY

## 2019-10-17 NOTE — Telephone Encounter (Signed)
Will readdress after next hematology appt.

## 2019-10-18 ENCOUNTER — Other Ambulatory Visit: Payer: Self-pay

## 2019-10-18 ENCOUNTER — Telehealth: Payer: Self-pay

## 2019-10-18 NOTE — Telephone Encounter (Signed)
-----   Message from Earlie Server, MD sent at 10/17/2019  9:46 PM EDT ----- Please arrange him to get another phlebotomy x 1 next week. Ok to use 9/8 cbc.

## 2019-10-18 NOTE — Telephone Encounter (Signed)
No room in infusion for phleb next week. Ok to keep 9/21 appts as scheduled.

## 2019-10-18 NOTE — Telephone Encounter (Signed)
Spoke to patient wife regarding the need for phleb next week and she requested to be seen earlier for biopsy results. I notified her that space was limited next week but we will check and see if appts can be moved up.

## 2019-10-18 NOTE — Telephone Encounter (Signed)
FYI... Infusion doesn't have the avail space to accommodate anymore pts. on Friday they are right at 78 to 40pts the week of 9/13 I called and spoke with pts wife she stated that they'll keep appts as sched for 9/21. She was mostly concerned about his Biopsy results.

## 2019-10-23 LAB — SURGICAL PATHOLOGY

## 2019-10-24 ENCOUNTER — Encounter (HOSPITAL_COMMUNITY): Payer: Self-pay | Admitting: Oncology

## 2019-10-29 ENCOUNTER — Other Ambulatory Visit: Payer: Self-pay

## 2019-10-29 ENCOUNTER — Inpatient Hospital Stay: Payer: Medicare Other

## 2019-10-29 ENCOUNTER — Encounter: Payer: Self-pay | Admitting: Oncology

## 2019-10-29 ENCOUNTER — Inpatient Hospital Stay (HOSPITAL_BASED_OUTPATIENT_CLINIC_OR_DEPARTMENT_OTHER): Payer: Medicare Other | Admitting: Oncology

## 2019-10-29 VITALS — BP 149/82 | HR 67 | Temp 97.9°F | Resp 18 | Wt 220.3 lb

## 2019-10-29 VITALS — BP 135/82 | HR 70 | Resp 16

## 2019-10-29 DIAGNOSIS — Z86711 Personal history of pulmonary embolism: Secondary | ICD-10-CM | POA: Diagnosis not present

## 2019-10-29 DIAGNOSIS — Z1589 Genetic susceptibility to other disease: Secondary | ICD-10-CM | POA: Diagnosis not present

## 2019-10-29 DIAGNOSIS — D72820 Lymphocytosis (symptomatic): Secondary | ICD-10-CM

## 2019-10-29 DIAGNOSIS — D45 Polycythemia vera: Secondary | ICD-10-CM

## 2019-10-29 DIAGNOSIS — D751 Secondary polycythemia: Secondary | ICD-10-CM

## 2019-10-29 DIAGNOSIS — Z7189 Other specified counseling: Secondary | ICD-10-CM | POA: Diagnosis not present

## 2019-10-29 DIAGNOSIS — Z86718 Personal history of other venous thrombosis and embolism: Secondary | ICD-10-CM

## 2019-10-29 LAB — HEMOGLOBIN: Hemoglobin: 15.9 g/dL (ref 13.0–17.0)

## 2019-10-29 LAB — HEMATOCRIT: HCT: 48.7 % (ref 39.0–52.0)

## 2019-10-29 MED ORDER — HYDROXYUREA 500 MG PO CAPS: 500.0000 mg | ORAL_CAPSULE | Freq: Every day | ORAL | 0 refills | Status: DC

## 2019-10-29 MED ORDER — SODIUM CHLORIDE 0.9 % IV SOLN
Freq: Once | INTRAVENOUS | Status: DC
  Filled 2019-10-29: qty 250

## 2019-10-29 MED ORDER — APIXABAN 2.5 MG PO TABS: 2.5000 mg | ORAL_TABLET | Freq: Two times a day (BID) | ORAL | 2 refills | Status: DC

## 2019-10-29 NOTE — Progress Notes (Signed)
Hematology/Oncology follow up note [facility] Telephone:(336) [phone] [phone]) [phone]   Patient Care Team: Birdie Sons, MD as PCP - General (Family Medicine) Lorelee Cover., MD as Consulting Physician (Ophthalmology) Quintin Alto, MD as Referring Physician (Rheumatology) Dorene Ar, MD (Pain Medicine) Ubaldo Glassing Javier Docker, MD as Consulting Physician (Cardiology) Earlie Server, MD as Consulting Physician (Oncology)  REFERRING PROVIDER: Birdie Sons, MD  CHIEF COMPLAINTS/REASON FOR VISIT:  Follow-up for erythrocytosis  HISTORY OF PRESENTING ILLNESS:   Edward Mejia is a  74 y.o.  male with PMH including rheumatoid arthritis, peripheral vascular disease hypertension, Covid infection history,, was seen in consultation at the request of  Fisher, Kirstie Peri, MD  for evaluation of polycytosis Patient was accompanied by her wife who participated in providing medical history. February 2021, patient was hospitalized due to COVID-19 pneumonia, respiratory failure, treated with steroids, remdesivir, received bamlanivimab.  Hospitalization was also completed by [facility], bleeding was  thought to be secondary to hemorrhoids.  He was discharged on 04/07/2019 and presented back to emergency room on 04/12/2019 due to left lower extremity swelling, acute hypoxic respiratory failure.  Work-up showed acute bilateral pulmonary embolism, large left lower extremity DVT, thrombosis and IVC and left external iliac vein.  Patient was started on anticoagulation and transition to Eliquis Patient has been on Eliquis since March 2021.  Patient also takes aspirin.  Patient has history of CAD, follows up with Dr. Ubaldo Glassing.  Rheumatoid arthritis on Enbrel once a week.  Also on prednisone 7.5 mg daily. Patient has also been found to have erythrocytosis. 09/20/2019, CBC showed hemoglobin of 17.6, reviewed previous medical records, erythrocytosis can be traced back to at least 2017.  Patient also has  leukocytosis since March 2021.  Leukocytosis has trended down recently and CBC on 09/20/2019 showed a white count of 10.8, predominantly monocytosis.  Patient is a former smoker, 10-12-pack-year smoking history and he stopped smoking in 1984. Currently he has some chronic shortness of breath with exertion.  He has lost weight during his previous hospitalization and has gained weight since discharge.  Sometimes he feels sweaty.  Patient also reports intermittent dizziness/lightheaded episodes for the past couple of months.  He actually had an episode during today's visit and is spontaneously resolved after few minutes.  Denies any shortness of breath, chest pain, vision changes, focal deficits.  Patient has been referred to establish care with neurology for further evaluation.  INTERVAL HISTORY Edward Mejia is a 74 y.o. male who has above history reviewed by me today presents for follow up visit to discuss bone marrow biopsy results Patient reports dizziness has completely resolved after phlebotomy. He finished 6 months of anticoagulation with Eliquis and then ran out the medication for 2 days. He continues to have intermittent left lower extremity swelling which resolves after leg elevation.  He uses compression stocking. Patient has also had bone marrow biopsy done during interval and presents to discuss results. He has some bruising around the area of biopsy site, bruising has improved.  No new complaints today.  Review of Systems  Constitutional: Negative for appetite change, chills, fatigue and fever.  HENT:   Negative for hearing loss and voice change.   Eyes: Negative for eye problems.  Respiratory: Negative for chest tightness and cough.   Cardiovascular: Negative for chest pain.  Gastrointestinal: Negative for abdominal distention, abdominal pain and blood in stool.  Endocrine: Negative for hot flashes.  Genitourinary: Negative for difficulty urinating and frequency.  Musculoskeletal: Negative for arthralgias.  Skin: Negative for itching and rash.  Neurological: Negative for extremity weakness and light-headedness.  Hematological: Negative for adenopathy.  Psychiatric/Behavioral: Negative for confusion.    MEDICAL HISTORY:  Past Medical History:  Diagnosis Date  . Arthritis   . Bilateral pulmonary embolism ([place]) 03/2019   in setting of Covid-19 pneumonia  . Cancer ([place])    skin cancer  . Cellulitis   . Cellulitis   . DVT (deep venous thrombosis) ([place]) 03/2019   in setting of Covid-19 pneumonia  . Erythrocytosis 09/20/2019  . H/O adenomatous polyp of colon 03/24/2015  . History of brain disorder: history of amaurosis fugax 03/24/2015  . History of pneumonia 04/04/2008  . History of rheumatic fever 03/24/2015   DID Have Rheumatic Fever.    . Hypertension   . Peptic ulcer disease   . Sepsis ([place]) 04/12/2019   secondary to Covid pneumonia    SURGICAL HISTORY: Past Surgical History:  Procedure Laterality Date  . APPENDECTOMY    . LEFT HEART CATH AND CORONARY ANGIOGRAPHY Left 02/06/2018   Procedure: LEFT HEART CATH AND CORONARY ANGIOGRAPHY;  Surgeon: Teodoro Spray, MD;  Location: [place] [facility];  Service: Cardiovascular;  Laterality: Left;  Marland Kitchen MANDIBLE FRACTURE SURGERY     fractured jaw  . PARTIAL GASTRECTOMY     peptic ulcer disease    SOCIAL HISTORY: Social History   Socioeconomic History  . Marital status: Married    Spouse name: Lovey Newcomer   . Number of children: 4  . Years of education: Not on file  . Highest education level: 8th grade  Occupational History  . Occupation: Disabled    Comment: due to Psoriatic Arthritis  Tobacco Use  . Smoking status: Former Smoker    Quit date: 09/20/1982    Years since quitting: 37.1  . Smokeless tobacco: Never Used  . Tobacco comment: quit over 40+ years ago  Vaping Use  . Vaping Use: Never used  Substance and Sexual Activity  . Alcohol use: No    Alcohol/week: 0.0 standard drinks   . Drug use: No  . Sexual activity: Not on file  Other Topics Concern  . Not on file  Social History Narrative  . Not on file   Social Determinants of Health   Financial Resource Strain: Low Risk   . Difficulty of Paying Living Expenses: Not hard at all  Food Insecurity:   . Worried About Charity fundraiser in the Last Year: Not on file  . Ran Out of Food in the Last Year: Not on file  Transportation Needs:   . Lack of Transportation (Medical): Not on file  . Lack of Transportation (Non-Medical): Not on file  Physical Activity: Unknown  . Days of Exercise per Week: Not on file  . Minutes of Exercise per Session: 0 min  Stress: No Stress Concern Present  . Feeling of Stress : Not at all  Social Connections: Unknown  . Frequency of Communication with Friends and Family: Patient refused  . Frequency of Social Gatherings with Friends and Family: Patient refused  . Attends Religious Services: Patient refused  . Active Member of Clubs or Organizations: Patient refused  . Attends Archivist Meetings: Patient refused  . Marital Status: Patient refused  Intimate Partner Violence: Unknown  . Fear of Current or Ex-Partner: Patient refused  . Emotionally Abused: Patient refused  . Physically Abused: Patient refused  . Sexually Abused: Patient refused    FAMILY HISTORY: Family History  Problem Relation Age of Onset  . Cancer Mother        lung cancer  . Heart disease Father 25       heart attack  . Cancer Brother        pancreatic cancer  . Cancer Brother        pancreatic cancer    ALLERGIES:  is allergic to pravachol [pravastatin].  MEDICATIONS:  Current Outpatient Medications  Medication Sig Dispense Refill  . acetaminophen (TYLENOL) 500 MG tablet Take 1,000 mg by mouth 3 (three) times daily as needed for mild pain or headache.     . albuterol (PROVENTIL HFA) 108 (90 Base) MCG/ACT inhaler Inhale 2 puffs into the lungs every 6 (six) hours as needed for wheezing  or shortness of breath.    Marland Kitchen aspirin EC 81 MG tablet Take 81 mg by mouth daily. Swallow whole.    . etanercept (ENBREL) 50 MG/ML injection Inject 50 mg into the skin every Thursday.     . gabapentin (NEURONTIN) 300 MG capsule Take 900 mg by mouth See admin instructions. Take 600 mg by mouth once daily at 4 PM    . HYDROcodone-acetaminophen (NORCO) 7.5-325 MG tablet Take 1 tablet by mouth 3 (three) times daily as needed for moderate pain. (Patient taking differently: Take 1 tablet by mouth every 6 (six) hours. ) 90 tablet 0  . hydrocortisone 2.5 % cream Apply topically 2 (two) times daily. As needed 30 g 3  . isosorbide mononitrate (IMDUR) 120 MG 24 hr tablet Take 120 mg by mouth daily.     . nitroGLYCERIN (NITROSTAT) 0.4 MG SL tablet Place 0.4 mg under the tongue every 5 (five) minutes as needed for chest pain.     . ranolazine (RANEXA) 500 MG 12 hr tablet Take 500 mg by mouth in the morning and at bedtime.    . valsartan-hydrochlorothiazide (DIOVAN-HCT) 160-12.5 MG tablet Take 1 tablet by mouth daily. 90 tablet 3  . apixaban (ELIQUIS) 2.5 MG TABS tablet Take 1 tablet (2.5 mg total) by mouth 2 (two) times daily. 60 tablet 2  . hydroxyurea (HYDREA) 500 MG capsule Take 1 capsule (500 mg total) by mouth daily. May take with food to minimize GI side effects. 30 capsule 0  . predniSONE (DELTASONE) 5 MG tablet Take 7.5 mg by mouth daily with lunch.  (Patient not taking: Reported on 10/29/2019)     No current facility-administered medications for this visit.   Facility-Administered Medications Ordered in Other Visits  Medication Dose Route Frequency Provider Last Rate Last Admin  . 0.9 %  sodium chloride infusion   Intravenous Once Earlie Server, MD         PHYSICAL EXAMINATION: ECOG PERFORMANCE STATUS: 1 - Symptomatic but completely ambulatory See Epics.  . Physical Exam Constitutional:      General: He is not in acute distress. HENT:     Head: Normocephalic and atraumatic.  Eyes:     General: No  scleral icterus. Cardiovascular:     Rate and Rhythm: Normal rate and regular rhythm.     Heart sounds: Normal heart sounds.  Pulmonary:     Effort: Pulmonary effort is normal. No respiratory distress.     Breath sounds: No wheezing.  Abdominal:     General: Bowel sounds are normal. There is no distension.     Palpations: Abdomen is soft.  Musculoskeletal:        General: No deformity. Normal range of motion.     Cervical back: Normal  range of motion and neck supple.  Skin:    General: Skin is warm and dry.     Findings: No erythema or rash.  Neurological:     Mental Status: He is alert and oriented to person, place, and time. Mental status is at baseline.     Cranial Nerves: No cranial nerve deficit.     Coordination: Coordination normal.  Psychiatric:        Mood and Affect: Mood normal.     LABORATORY DATA:  I have reviewed the data as listed Lab Results  Component Value Date   WBC 11.3 (H) 10/16/2019   HGB 15.9 10/29/2019   HCT 48.7 10/29/2019   MCV 78.8 (L) 10/16/2019   PLT 528 (H) 10/16/2019   Recent Labs    04/12/19 1045 04/13/19 0418 04/16/19 0243 04/16/19 0243 04/26/19 1545 09/16/19 1058 09/17/19 1049  NA 131*   < > 134*  --  133*  --  133*  K 5.1   < > 4.3  --  4.8  --  4.6  CL 101   < > 95*  --  95*  --  93*  CO2 16*   < > 26  --  23  --  23  GLUCOSE 102*   < > 75  --  125*  --  61*  BUN 17   < > 18  --  10  --  12  CREATININE 0.91   < > 1.05   < > 1.17 1.30* 1.23  CALCIUM 8.0*   < > 7.9*  --  8.4*  --  9.1  GFRNONAA >60   < > >60  --  61  --  57*  GFRAA >60   < > >60  --  71  --  66  PROT 5.9*   < > 5.0*  --  5.7*  --  6.2  ALBUMIN 2.6*   < > 2.4*  --  3.5*  --  4.2  AST 29   < > 26  --  22  --  14  ALT 23   < > 32  --  17  --  13  ALKPHOS 49   < > 47  --  79  --  51  BILITOT 2.6*   < > 1.5*  --  1.3*  --  1.0  BILIDIR 0.4*  --   --   --   --   --   --   IBILI 2.2*  --   --   --   --   --   --    < > = values in this interval not displayed.    Iron/TIBC/Ferritin/ %Sat    Component Value Date/Time   IRON 49 09/20/2019 1201   TIBC 318 09/20/2019 1201   FERRITIN 24 09/20/2019 1201   IRONPCTSAT 15 (L) 09/20/2019 1201      RADIOGRAPHIC STUDIES: I have personally reviewed the radiological images as listed and agreed with the findings in the report. CT BONE MARROW BIOPSY & ASPIRATION  Result Date: 10/16/2019 INDICATION: Poly cytosis. Please perform CT-guided bone marrow biopsy for tissue diagnostic purposes. EXAM: CT-GUIDED BONE MARROW BIOPSY AND ASPIRATION MEDICATIONS: None ANESTHESIA/SEDATION: Fentanyl 100 mcg IV; Versed 2 mg IV Sedation Time: 10 Minutes; The patient was continuously monitored during the procedure by the interventional radiology nurse under my direct supervision. COMPLICATIONS: None immediate. PROCEDURE: Informed consent was obtained from the patient following an explanation of the procedure, risks, benefits and alternatives.  The patient understands, agrees and consents for the procedure. All questions were addressed. A time out was performed prior to the initiation of the procedure. The patient was positioned prone and non-contrast localization CT was performed of the pelvis to demonstrate the iliac marrow spaces. The operative site was prepped and draped in the usual sterile fashion. Under sterile conditions and local anesthesia, a 22 gauge spinal needle was utilized for procedural planning. Next, an 11 gauge coaxial bone biopsy needle was advanced into the left iliac marrow space. Needle position was confirmed with CT imaging. Initially, a bone marrow aspiration was performed. Next, a bone marrow biopsy was obtained with the 11 gauge outer bone marrow device. The 11 gauge coaxial bone biopsy needle was re-advanced into a slightly different location within the left iliac marrow space, positioning was confirmed with CT imaging and an additional bone marrow biopsy was obtained. The needle was removed and superficial hemostasis  was obtained with manual compression. A dressing was applied. The patient tolerated the procedure well without immediate post procedural complication. IMPRESSION: Successful CT guided left iliac bone marrow aspiration and core biopsy. Electronically Signed   By: Simonne Come M.D.   On: 10/16/2019 09:26      ASSESSMENT & PLAN:  1. Polycythemia vera (HCC)   2. Goals of care, counseling/discussion   3. JAK2 V617F mutation   4. History of pulmonary embolism   5. History of DVT of lower extremity   6. Monoclonal B-cell lymphocytosis of undetermined significance    #Polycythemia vera,  JAK2 V617F mutation. Bone marrow biopsy results were reviewed and discussed with patient. Bone marrow showed hypercellular bone marrow, 10 myeloid proliferation, consistent with MPN, particularly polycythemia vera.  Iron store is absent. Diagnosis of polycythemia vera was discussed with patient. I recommend phlebotomy to keep hematocrit less than 45, Recommend continue aspirin 81 mg. For his history of thrombosis, in the context of polycythemia vera, I recommend long-term anticoagulation prophylaxis.  He has finished 6 months of full dose Eliquis, I sent him a prescription of Eliquis 2.5 mg twice daily.  I recommend cytoreductive therapy with hydroxyurea 500 mg daily, titrate dosage with treatment response and side effects. Rationale and potential side effects of hydroxyurea was discussed with patient and he agrees with the plan.  Prescription was sent to pharmacy. I will repeat CBC in 2 weeks and patient will follow up in the clinic with repeat blood and MD evaluation in 4 weeks.  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  Recommend leg elevation, compression stocking. #Flow cytometry also showed monoclonal lymphocytosis of unknown significance.  This was confirmed on bone marrow biopsy which was found on the bone marrow aspirate.  He does not have any lymphadenopathy on recent  CT chest scans.  Recommend continue monitor symptoms and observation.  Orders Placed This Encounter  Procedures  . CBC with Differential/Platelet    Standing Status:   Future    Standing Expiration Date:   10/28/2020  . Comprehensive metabolic panel    Standing Status:   Future    Standing Expiration Date:   10/28/2020  . CBC with Differential/Platelet    Standing Status:   Future    Standing Expiration Date:   10/28/2020  . Comprehensive metabolic panel    Standing Status:   Future    Standing Expiration Date:   10/28/2020    All questions were answered. The patient knows to call the clinic with any problems questions or concerns.  cc Fisher,  Kirstie Peri, MD   Earlie Server, MD, PhD Hematology Oncology [facility] Pager- [phone] 10/29/2019

## 2019-10-29 NOTE — Progress Notes (Signed)
Pt here for follow up. No new concerns voiced.   

## 2019-10-29 NOTE — Progress Notes (Signed)
300cc of blood removed via therapeutic phlebotomy. Pt tolerated well. VSS at discharge.

## 2019-10-30 ENCOUNTER — Encounter: Payer: Self-pay | Admitting: Family Medicine

## 2019-10-30 DIAGNOSIS — Z7901 Long term (current) use of anticoagulants: Secondary | ICD-10-CM | POA: Insufficient documentation

## 2019-11-12 ENCOUNTER — Inpatient Hospital Stay: Payer: Medicare Other | Attending: Oncology

## 2019-11-12 ENCOUNTER — Other Ambulatory Visit: Payer: Self-pay

## 2019-11-12 DIAGNOSIS — Z800 Family history of malignant neoplasm of digestive organs: Secondary | ICD-10-CM | POA: Insufficient documentation

## 2019-11-12 DIAGNOSIS — D72820 Lymphocytosis (symptomatic): Secondary | ICD-10-CM | POA: Diagnosis not present

## 2019-11-12 DIAGNOSIS — Z801 Family history of malignant neoplasm of trachea, bronchus and lung: Secondary | ICD-10-CM | POA: Insufficient documentation

## 2019-11-12 DIAGNOSIS — Z79899 Other long term (current) drug therapy: Secondary | ICD-10-CM | POA: Insufficient documentation

## 2019-11-12 DIAGNOSIS — Z86718 Personal history of other venous thrombosis and embolism: Secondary | ICD-10-CM | POA: Insufficient documentation

## 2019-11-12 DIAGNOSIS — Z8249 Family history of ischemic heart disease and other diseases of the circulatory system: Secondary | ICD-10-CM | POA: Insufficient documentation

## 2019-11-12 DIAGNOSIS — Z85828 Personal history of other malignant neoplasm of skin: Secondary | ICD-10-CM | POA: Insufficient documentation

## 2019-11-12 DIAGNOSIS — Z87891 Personal history of nicotine dependence: Secondary | ICD-10-CM | POA: Insufficient documentation

## 2019-11-12 DIAGNOSIS — M069 Rheumatoid arthritis, unspecified: Secondary | ICD-10-CM | POA: Insufficient documentation

## 2019-11-12 DIAGNOSIS — Z7982 Long term (current) use of aspirin: Secondary | ICD-10-CM | POA: Insufficient documentation

## 2019-11-12 DIAGNOSIS — D45 Polycythemia vera: Secondary | ICD-10-CM | POA: Insufficient documentation

## 2019-11-12 DIAGNOSIS — Z8616 Personal history of COVID-19: Secondary | ICD-10-CM | POA: Diagnosis not present

## 2019-11-12 DIAGNOSIS — Z7901 Long term (current) use of anticoagulants: Secondary | ICD-10-CM | POA: Insufficient documentation

## 2019-11-12 DIAGNOSIS — Z7952 Long term (current) use of systemic steroids: Secondary | ICD-10-CM | POA: Diagnosis not present

## 2019-11-12 DIAGNOSIS — Z86711 Personal history of pulmonary embolism: Secondary | ICD-10-CM | POA: Insufficient documentation

## 2019-11-12 LAB — COMPREHENSIVE METABOLIC PANEL
ALT: 15 U/L (ref 0–44)
AST: 15 U/L (ref 15–41)
Albumin: 3.9 g/dL (ref 3.5–5.0)
Alkaline Phosphatase: 32 U/L — ABNORMAL LOW (ref 38–126)
Anion gap: 8 (ref 5–15)
BUN: 15 mg/dL (ref 8–23)
CO2: 29 mmol/L (ref 22–32)
Calcium: 8.5 mg/dL — ABNORMAL LOW (ref 8.9–10.3)
Chloride: 96 mmol/L — ABNORMAL LOW (ref 98–111)
Creatinine, Ser: 1.02 mg/dL (ref 0.61–1.24)
GFR calc non Af Amer: >60 mL/min (ref >60)
Glucose, Bld: 97 mg/dL (ref 70–99)
Potassium: 4.7 mmol/L (ref 3.5–5.1)
Sodium: 133 mmol/L — ABNORMAL LOW (ref 135–145)
Total Bilirubin: 1.4 mg/dL — ABNORMAL HIGH (ref 0.3–1.2)
Total Protein: 6.3 g/dL — ABNORMAL LOW (ref 6.5–8.1)

## 2019-11-12 LAB — CBC WITH DIFFERENTIAL/PLATELET
Abs Immature Granulocytes: 0.13 10*3/uL — ABNORMAL HIGH (ref 0.00–0.07)
Basophils Absolute: 0.1 10*3/uL (ref 0.0–0.1)
Basophils Relative: 1 %
Eosinophils Absolute: 0.1 10*3/uL (ref 0.0–0.5)
Eosinophils Relative: 1 %
HCT: 48.3 % (ref 39.0–52.0)
Hemoglobin: 15.7 g/dL (ref 13.0–17.0)
Immature Granulocytes: 1 %
Lymphocytes Relative: 27 %
Lymphs Abs: 3.1 10*3/uL (ref 0.7–4.0)
MCH: 25.7 pg — ABNORMAL LOW (ref 26.0–34.0)
MCHC: 32.5 g/dL (ref 30.0–36.0)
MCV: 78.9 fL — ABNORMAL LOW (ref 80.0–100.0)
Monocytes Absolute: 1.2 10*3/uL — ABNORMAL HIGH (ref 0.1–1.0)
Monocytes Relative: 10 %
Neutro Abs: 6.9 10*3/uL (ref 1.7–7.7)
Neutrophils Relative %: 60 %
Platelets: 362 10*3/uL (ref 150–400)
RBC: 6.12 MIL/uL — ABNORMAL HIGH (ref 4.22–5.81)
RDW: 18 % — ABNORMAL HIGH (ref 11.5–15.5)
WBC: 11.6 10*3/uL — ABNORMAL HIGH (ref 4.0–10.5)
nRBC: 0 % (ref 0.0–0.2)

## 2019-11-29 ENCOUNTER — Encounter: Payer: Self-pay | Admitting: Oncology

## 2019-11-29 ENCOUNTER — Inpatient Hospital Stay: Payer: Medicare Other | Admitting: Oncology

## 2019-11-29 ENCOUNTER — Other Ambulatory Visit: Payer: Self-pay

## 2019-11-29 ENCOUNTER — Inpatient Hospital Stay: Payer: Medicare Other

## 2019-11-29 VITALS — BP 146/80 | HR 64 | Temp 97.6°F | Wt 221.3 lb

## 2019-11-29 DIAGNOSIS — Z85828 Personal history of other malignant neoplasm of skin: Secondary | ICD-10-CM | POA: Diagnosis not present

## 2019-11-29 DIAGNOSIS — Z86711 Personal history of pulmonary embolism: Secondary | ICD-10-CM | POA: Diagnosis not present

## 2019-11-29 DIAGNOSIS — Z86718 Personal history of other venous thrombosis and embolism: Secondary | ICD-10-CM | POA: Diagnosis not present

## 2019-11-29 DIAGNOSIS — Z7982 Long term (current) use of aspirin: Secondary | ICD-10-CM | POA: Diagnosis not present

## 2019-11-29 DIAGNOSIS — D45 Polycythemia vera: Secondary | ICD-10-CM

## 2019-11-29 DIAGNOSIS — D72820 Lymphocytosis (symptomatic): Secondary | ICD-10-CM | POA: Insufficient documentation

## 2019-11-29 DIAGNOSIS — Z800 Family history of malignant neoplasm of digestive organs: Secondary | ICD-10-CM | POA: Diagnosis not present

## 2019-11-29 DIAGNOSIS — I87009 Postthrombotic syndrome without complications of unspecified extremity: Secondary | ICD-10-CM | POA: Insufficient documentation

## 2019-11-29 DIAGNOSIS — Z87891 Personal history of nicotine dependence: Secondary | ICD-10-CM | POA: Diagnosis not present

## 2019-11-29 DIAGNOSIS — Z801 Family history of malignant neoplasm of trachea, bronchus and lung: Secondary | ICD-10-CM | POA: Diagnosis not present

## 2019-11-29 DIAGNOSIS — Z1589 Genetic susceptibility to other disease: Secondary | ICD-10-CM

## 2019-11-29 DIAGNOSIS — Z7901 Long term (current) use of anticoagulants: Secondary | ICD-10-CM | POA: Diagnosis not present

## 2019-11-29 DIAGNOSIS — Z79899 Other long term (current) drug therapy: Secondary | ICD-10-CM | POA: Diagnosis not present

## 2019-11-29 DIAGNOSIS — M069 Rheumatoid arthritis, unspecified: Secondary | ICD-10-CM | POA: Diagnosis not present

## 2019-11-29 DIAGNOSIS — Z8616 Personal history of COVID-19: Secondary | ICD-10-CM | POA: Diagnosis not present

## 2019-11-29 DIAGNOSIS — Z7952 Long term (current) use of systemic steroids: Secondary | ICD-10-CM | POA: Diagnosis not present

## 2019-11-29 DIAGNOSIS — Z8249 Family history of ischemic heart disease and other diseases of the circulatory system: Secondary | ICD-10-CM | POA: Diagnosis not present

## 2019-11-29 LAB — CBC WITH DIFFERENTIAL/PLATELET
Abs Immature Granulocytes: 0.15 10*3/uL — ABNORMAL HIGH (ref 0.00–0.07)
Basophils Absolute: 0.1 10*3/uL (ref 0.0–0.1)
Basophils Relative: 1 %
Eosinophils Absolute: 0.1 10*3/uL (ref 0.0–0.5)
Eosinophils Relative: 1 %
HCT: 49.9 % (ref 39.0–52.0)
Hemoglobin: 15.9 g/dL (ref 13.0–17.0)
Immature Granulocytes: 1 %
Lymphocytes Relative: 29 %
Lymphs Abs: 3.1 10*3/uL (ref 0.7–4.0)
MCH: 25.6 pg — ABNORMAL LOW (ref 26.0–34.0)
MCHC: 31.9 g/dL (ref 30.0–36.0)
MCV: 80.2 fL (ref 80.0–100.0)
Monocytes Absolute: 0.8 10*3/uL (ref 0.1–1.0)
Monocytes Relative: 7 %
Neutro Abs: 6.4 10*3/uL (ref 1.7–7.7)
Neutrophils Relative %: 61 %
Platelets: 294 10*3/uL (ref 150–400)
RBC: 6.22 MIL/uL — ABNORMAL HIGH (ref 4.22–5.81)
RDW: 18.9 % — ABNORMAL HIGH (ref 11.5–15.5)
WBC: 10.6 10*3/uL — ABNORMAL HIGH (ref 4.0–10.5)
nRBC: 0 % (ref 0.0–0.2)

## 2019-11-29 LAB — COMPREHENSIVE METABOLIC PANEL
ALT: 15 U/L (ref 0–44)
AST: 17 U/L (ref 15–41)
Albumin: 3.8 g/dL (ref 3.5–5.0)
Alkaline Phosphatase: 38 U/L (ref 38–126)
Anion gap: 7 (ref 5–15)
BUN: 13 mg/dL (ref 8–23)
CO2: 29 mmol/L (ref 22–32)
Calcium: 8.2 mg/dL — ABNORMAL LOW (ref 8.9–10.3)
Chloride: 97 mmol/L — ABNORMAL LOW (ref 98–111)
Creatinine, Ser: 1.15 mg/dL (ref 0.61–1.24)
GFR, Estimated: >60 mL/min (ref >60)
Glucose, Bld: 114 mg/dL — ABNORMAL HIGH (ref 70–99)
Potassium: 3.7 mmol/L (ref 3.5–5.1)
Sodium: 133 mmol/L — ABNORMAL LOW (ref 135–145)
Total Bilirubin: 1.1 mg/dL (ref 0.3–1.2)
Total Protein: 5.9 g/dL — ABNORMAL LOW (ref 6.5–8.1)

## 2019-11-29 MED ORDER — HYDROXYUREA 500 MG PO CAPS: ORAL_CAPSULE | ORAL | 0 refills | Status: DC

## 2019-11-29 NOTE — Progress Notes (Signed)
Patient here for folow up. Pt reports that approx 2 night ago left leg was very warm from the knee to foot. He also had a knot on the same leg and they are concerned since this is where the blood clot is.

## 2019-11-29 NOTE — Progress Notes (Signed)
Hematology/Oncology follow up note [facility] Telephone:([phone] [phone]   Patient Care Team: Birdie Sons, MD as PCP - General (Family Medicine) Lorelee Cover., MD as Consulting Physician (Ophthalmology) Quintin Alto, MD as Referring Physician (Rheumatology) Dorene Ar, MD (Pain Medicine) Ubaldo Glassing Javier Docker, MD as Consulting Physician (Cardiology) Earlie Server, MD as Consulting Physician (Oncology)  REFERRING PROVIDER: Birdie Sons, MD  CHIEF COMPLAINTS/REASON FOR VISIT:  Follow-up for erythrocytosis  HISTORY OF PRESENTING ILLNESS:   Edward Mejia is a  74 y.o.  male with PMH including rheumatoid arthritis, peripheral vascular disease hypertension, Covid infection history,, was seen in consultation at the request of  Fisher, Kirstie Peri, MD  for evaluation of polycytosis Patient was accompanied by her wife who participated in providing medical history. February 2021, patient was hospitalized due to COVID-19 pneumonia, respiratory failure, treated with steroids, remdesivir, received bamlanivimab.  Hospitalization was also completed by [facility], bleeding was  thought to be secondary to hemorrhoids.  He was discharged on 04/07/2019 and presented back to emergency room on 04/12/2019 due to left lower extremity swelling, acute hypoxic respiratory failure.  Work-up showed acute bilateral pulmonary embolism, large left lower extremity DVT, thrombosis and IVC and left external iliac vein.  Patient was started on anticoagulation and transition to Eliquis Patient has been on Eliquis since March 2021.  Patient also takes aspirin.  Patient has history of CAD, follows up with Dr. Ubaldo Glassing.  Rheumatoid arthritis on Enbrel once a week.  Also on prednisone 7.5 mg daily. Patient has also been found to have erythrocytosis. 09/20/2019, CBC showed hemoglobin of 17.6, reviewed previous medical records, erythrocytosis can be traced back to at least 2017.  Patient also has  leukocytosis since March 2021.  Leukocytosis has trended down recently and CBC on 09/20/2019 showed a white count of 10.8, predominantly monocytosis.  Patient is a former smoker, 10-12-pack-year smoking history and he stopped smoking in 1984. Currently he has some chronic shortness of breath with exertion.  He has lost weight during his previous hospitalization and has gained weight since discharge.  Sometimes he feels sweaty.  Patient also reports intermittent dizziness/lightheaded episodes for the past couple of months.  He actually had an episode during today's visit and is spontaneously resolved after few minutes.  Denies any shortness of breath, chest pain, vision changes, focal deficits.  Patient has been referred to establish care with neurology for further evaluation.  INTERVAL HISTORY Edward Mejia is a 74 y.o. male who has above history reviewed by me today presents for follow up visit for management of polycythemia vera Patient has been on Eliquis 2.5 mg twice daily.  No bleeding events. He is also on hydroxyurea 500 mg daily.  He ran out medication yesterday. He tolerates well.  Wife noticed that a small area of left lower extremity has been swollen and spontaneously resolved.   Review of Systems  Constitutional: Negative for appetite change, chills, fatigue, fever and unexpected weight change.  HENT:   Negative for hearing loss and voice change.   Eyes: Negative for eye problems and icterus.  Respiratory: Negative for chest tightness, cough and shortness of breath.   Cardiovascular: Negative for chest pain and leg swelling.  Gastrointestinal: Negative for abdominal distention, abdominal pain and blood in stool.  Endocrine: Negative for hot flashes.  Genitourinary: Negative for difficulty urinating, dysuria and frequency.   Musculoskeletal: Negative for arthralgias.  Skin: Negative for itching and rash.  Neurological: Negative for extremity weakness, light-headedness and  numbness.  Hematological: Negative for adenopathy. Does not bruise/bleed easily.  Psychiatric/Behavioral: Negative for confusion.    MEDICAL HISTORY:  Past Medical History:  Diagnosis Date  . Arthritis   . Bilateral pulmonary embolism ([place]) 03/2019   in setting of Covid-19 pneumonia  . Cancer ([place])    skin cancer  . Cellulitis   . Cellulitis   . DVT (deep venous thrombosis) ([place]) 03/2019   in setting of Covid-19 pneumonia  . Erythrocytosis 09/20/2019  . H/O adenomatous polyp of colon 03/24/2015  . History of brain disorder: history of amaurosis fugax 03/24/2015  . History of pneumonia 04/04/2008  . History of rheumatic fever 03/24/2015   DID Have Rheumatic Fever.    . Hypertension   . Peptic ulcer disease   . Sepsis ([place]) 04/12/2019   secondary to Covid pneumonia    SURGICAL HISTORY: Past Surgical History:  Procedure Laterality Date  . APPENDECTOMY    . LEFT HEART CATH AND CORONARY ANGIOGRAPHY Left 02/06/2018   Procedure: LEFT HEART CATH AND CORONARY ANGIOGRAPHY;  Surgeon: Teodoro Spray, MD;  Location: [facility];  Service: Cardiovascular;  Laterality: Left;  Marland Kitchen MANDIBLE FRACTURE SURGERY     fractured jaw  . PARTIAL GASTRECTOMY     peptic ulcer disease    SOCIAL HISTORY: Social History   Socioeconomic History  . Marital status: Married    Spouse name: Lovey Newcomer   . Number of children: 4  . Years of education: Not on file  . Highest education level: 8th grade  Occupational History  . Occupation: Disabled    Comment: due to Psoriatic Arthritis  Tobacco Use  . Smoking status: Former Smoker    Quit date: 09/20/1982    Years since quitting: 37.2  . Smokeless tobacco: Never Used  . Tobacco comment: quit over 40+ years ago  Vaping Use  . Vaping Use: Never used  Substance and Sexual Activity  . Alcohol use: No    Alcohol/week: 0.0 standard drinks  . Drug use: No  . Sexual activity: Not on file  Other Topics Concern  . Not on file  Social History Narrative    . Not on file   Social Determinants of Health   Financial Resource Strain: Low Risk   . Difficulty of Paying Living Expenses: Not hard at all  Food Insecurity:   . Worried About Charity fundraiser in the Last Year: Not on file  . Ran Out of Food in the Last Year: Not on file  Transportation Needs:   . Lack of Transportation (Medical): Not on file  . Lack of Transportation (Non-Medical): Not on file  Physical Activity: Unknown  . Days of Exercise per Week: Not on file  . Minutes of Exercise per Session: 0 min  Stress: No Stress Concern Present  . Feeling of Stress : Not at all  Social Connections: Unknown  . Frequency of Communication with Friends and Family: Patient refused  . Frequency of Social Gatherings with Friends and Family: Patient refused  . Attends Religious Services: Patient refused  . Active Member of Clubs or Organizations: Patient refused  . Attends Archivist Meetings: Patient refused  . Marital Status: Patient refused  Intimate Partner Violence: Unknown  . Fear of Current or Ex-Partner: Patient refused  . Emotionally Abused: Patient refused  . Physically Abused: Patient refused  . Sexually Abused: Patient refused    FAMILY HISTORY: Family History  Problem Relation Age of Onset  . Cancer Mother  lung cancer  . Heart disease Father 47       heart attack  . Cancer Brother        pancreatic cancer  . Cancer Brother        pancreatic cancer    ALLERGIES:  is allergic to pravachol [pravastatin].  MEDICATIONS:  Current Outpatient Medications  Medication Sig Dispense Refill  . acetaminophen (TYLENOL) 500 MG tablet Take 1,000 mg by mouth 3 (three) times daily as needed for mild pain or headache.     . albuterol (PROVENTIL HFA) 108 (90 Base) MCG/ACT inhaler Inhale 2 puffs into the lungs every 6 (six) hours as needed for wheezing or shortness of breath.    Marland Kitchen apixaban (ELIQUIS) 2.5 MG TABS tablet Take 1 tablet (2.5 mg total) by mouth 2 (two)  times daily. 60 tablet 2  . aspirin EC 81 MG tablet Take 81 mg by mouth daily. Swallow whole.    . etanercept (ENBREL) 50 MG/ML injection Inject 50 mg into the skin every Thursday.     . gabapentin (NEURONTIN) 300 MG capsule Take 900 mg by mouth See admin instructions.     Marland Kitchen HYDROcodone-acetaminophen (NORCO) 7.5-325 MG tablet Take 1 tablet by mouth 3 (three) times daily as needed for moderate pain. (Patient taking differently: Take 1 tablet by mouth every 6 (six) hours. ) 90 tablet 0  . hydrocortisone 2.5 % cream Apply topically 2 (two) times daily. As needed 30 g 3  . isosorbide mononitrate (IMDUR) 120 MG 24 hr tablet Take 120 mg by mouth daily.     . predniSONE (DELTASONE) 5 MG tablet Take 7.5 mg by mouth every morning.    . ranolazine (RANEXA) 500 MG 12 hr tablet Take 500 mg by mouth in the morning and at bedtime.    . valsartan-hydrochlorothiazide (DIOVAN-HCT) 160-12.5 MG tablet Take 1 tablet by mouth daily. 90 tablet 3  . hydroxyurea (HYDREA) 500 MG capsule Take $RemoveBefo'1000mg'AflioqaAmtz$  ( 2 tablets ) on Mondays and Thursdays. Take 500 mg (1 tablet) al other days). May take with food to minimize GI side effects. 90 capsule 0  . nitroGLYCERIN (NITROSTAT) 0.4 MG SL tablet Place 0.4 mg under the tongue every 5 (five) minutes as needed for chest pain.  (Patient not taking: Reported on 11/29/2019)     No current facility-administered medications for this visit.     PHYSICAL EXAMINATION: ECOG PERFORMANCE STATUS: 1 - Symptomatic but completely ambulatory Today's Vitals   11/29/19 1327  BP: (!) 146/80  Pulse: 64  Temp: 97.6 F (36.4 C)  TempSrc: Tympanic  SpO2: 97%  Weight: 221 lb 4.8 oz (100.4 kg)   Body mass index is 28.41 kg/m.  Marland Kitchen Physical Exam Constitutional:      General: He is not in acute distress. HENT:     Head: Normocephalic and atraumatic.  Eyes:     General: No scleral icterus. Cardiovascular:     Rate and Rhythm: Normal rate and regular rhythm.     Heart sounds: Normal heart sounds.    Pulmonary:     Effort: Pulmonary effort is normal. No respiratory distress.     Breath sounds: No wheezing.  Abdominal:     General: Bowel sounds are normal. There is no distension.     Palpations: Abdomen is soft.  Musculoskeletal:        General: No deformity. Normal range of motion.     Cervical back: Normal range of motion and neck supple.  Skin:    General: Skin is warm  and dry.     Findings: No erythema or rash.  Neurological:     Mental Status: He is alert and oriented to person, place, and time. Mental status is at baseline.     Cranial Nerves: No cranial nerve deficit.     Coordination: Coordination normal.  Psychiatric:        Mood and Affect: Mood normal.     LABORATORY DATA:  I have reviewed the data as listed Lab Results  Component Value Date   WBC 10.6 (H) 11/29/2019   HGB 15.9 11/29/2019   HCT 49.9 11/29/2019   MCV 80.2 11/29/2019   PLT 294 11/29/2019   Recent Labs    02/22/19 1057 04/12/19 1045 04/13/19 0418 04/16/19 0243 04/26/19 1545 09/16/19 1058 09/17/19 1049 11/12/19 1315 11/29/19 1252  NA   < > 131*   < > 134* 133*  --  133* 133* 133*  K   < > 5.1   < > 4.3 4.8  --  4.6 4.7 3.7  CL   < > 101   < > 95* 95*  --  93* 96* 97*  CO2   < > 16*   < > 26 23  --  $R'23 29 29  'UB$ GLUCOSE   < > 102*   < > 75 125*  --  61* 97 114*  BUN   < > 17   < > 18 10  --  $R'12 15 13  'IV$ CREATININE   < > 0.91   < > 1.05 1.17   < > 1.23 1.02 1.15  CALCIUM   < > 8.0*   < > 7.9* 8.4*  --  9.1 8.5* 8.2*  GFRNONAA   < > >60   < > >60 61  --  57* >60 >60  GFRAA  --  >60   < > >60 71  --  66  --   --   PROT   < > 5.9*   < > 5.0* 5.7*  --  6.2 6.3* 5.9*  ALBUMIN   < > 2.6*   < > 2.4* 3.5*  --  4.2 3.9 3.8  AST   < > 29   < > 26 22  --  $R'14 15 17  'bC$ ALT   < > 23   < > 32 17  --  $R'13 15 15  'll$ ALKPHOS   < > 49   < > 47 79  --  51 32* 38  BILITOT   < > 2.6*   < > 1.5* 1.3*  --  1.0 1.4* 1.1  BILIDIR  --  0.4*  --   --   --   --   --   --   --   IBILI  --  2.2*  --   --   --   --   --    --   --    < > = values in this interval not displayed.   Iron/TIBC/Ferritin/ %Sat    Component Value Date/Time   IRON 49 09/20/2019 1201   TIBC 318 09/20/2019 1201   FERRITIN 24 09/20/2019 1201   IRONPCTSAT 15 (L) 09/20/2019 1201      RADIOGRAPHIC STUDIES: I have personally reviewed the radiological images as listed and agreed with the findings in the report. No results found.    ASSESSMENT & PLAN:  1. Polycythemia vera ([place])   2. JAK2 V617F mutation   3. History of pulmonary embolism   4. Post-thrombotic  syndrome   5. Monoclonal B-cell lymphocytosis of undetermined significance    #Polycythemia vera,  JAK2 V617F mutation. Continue hydroxyurea. Hematocrit goal is 45. Labs reviewed and discussed with patient.  Hematocrit is 49 today. I recommend to increase hydroxyurea to 1000 mg on Monday and Thursday, 500 mg on rest of the days. Repeat CBC in 2 weeks +/- phlebotomy if HCT is above 45. Patient will follow up with me in 4 weeks for further titration of hydroxyurea dosage. Continue aspirin 81 mg  For history of thrombosis, continue Eliquis 2.5 mg twice daily. 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   Recommend continue to use compression stocking and encourage leg elevation.  # monoclonal lymphocytosis of unknown significance.  By flow cytometry and bone marrow biopsy Continue observation . Orders Placed This Encounter  Procedures  . CBC with Differential/Platelet    Standing Status:   Future    Standing Expiration Date:   11/28/2020  . Hepatic function panel    Standing Status:   Future    Standing Expiration Date:   11/28/2020  . CBC with Differential/Platelet    Standing Status:   Future    Standing Expiration Date:   11/28/2020  . Comprehensive metabolic panel    Standing Status:   Future    Standing Expiration Date:   11/28/2020    All questions were answered. The patient knows to call the clinic with any problems  questions or concerns.  cc Birdie Sons, MD  Follow-up 4 weeks Earlie Server, MD, PhD Hematology Oncology [facility] Pager- [phone] 11/29/2019

## 2019-12-16 ENCOUNTER — Inpatient Hospital Stay: Payer: Medicare Other | Attending: Oncology

## 2019-12-16 ENCOUNTER — Other Ambulatory Visit: Payer: Self-pay

## 2019-12-16 DIAGNOSIS — Z7901 Long term (current) use of anticoagulants: Secondary | ICD-10-CM | POA: Diagnosis not present

## 2019-12-16 DIAGNOSIS — D72820 Lymphocytosis (symptomatic): Secondary | ICD-10-CM | POA: Diagnosis not present

## 2019-12-16 DIAGNOSIS — Z86711 Personal history of pulmonary embolism: Secondary | ICD-10-CM | POA: Diagnosis not present

## 2019-12-16 DIAGNOSIS — Z1589 Genetic susceptibility to other disease: Secondary | ICD-10-CM

## 2019-12-16 DIAGNOSIS — D45 Polycythemia vera: Secondary | ICD-10-CM | POA: Diagnosis not present

## 2019-12-16 LAB — HEPATIC FUNCTION PANEL
ALT: 16 U/L (ref 0–44)
AST: 16 U/L (ref 15–41)
Albumin: 4 g/dL (ref 3.5–5.0)
Alkaline Phosphatase: 35 U/L — ABNORMAL LOW (ref 38–126)
Bilirubin, Direct: 0.3 mg/dL — ABNORMAL HIGH (ref 0.0–0.2)
Indirect Bilirubin: 1.2 mg/dL — ABNORMAL HIGH (ref 0.3–0.9)
Total Bilirubin: 1.5 mg/dL — ABNORMAL HIGH (ref 0.3–1.2)
Total Protein: 6.4 g/dL — ABNORMAL LOW (ref 6.5–8.1)

## 2019-12-16 LAB — HEMOGLOBIN AND HEMATOCRIT, BLOOD
HCT: 51.1 % (ref 39.0–52.0)
Hemoglobin: 16.3 g/dL (ref 13.0–17.0)

## 2019-12-23 IMAGING — CR DG RIBS 2V*R*
1 series · 4 of 4 positions shown · non-contrast
Comparison: Chest radiographs dated 04/21/2016

CLINICAL DATA: Right rib pain x1 week

EXAM:
RIGHT RIBS - 2 VIEW

[Series 1: dg ribs unilateral right · 0.14mm/px · 4 of 4 slices shown]
[im 1/4]
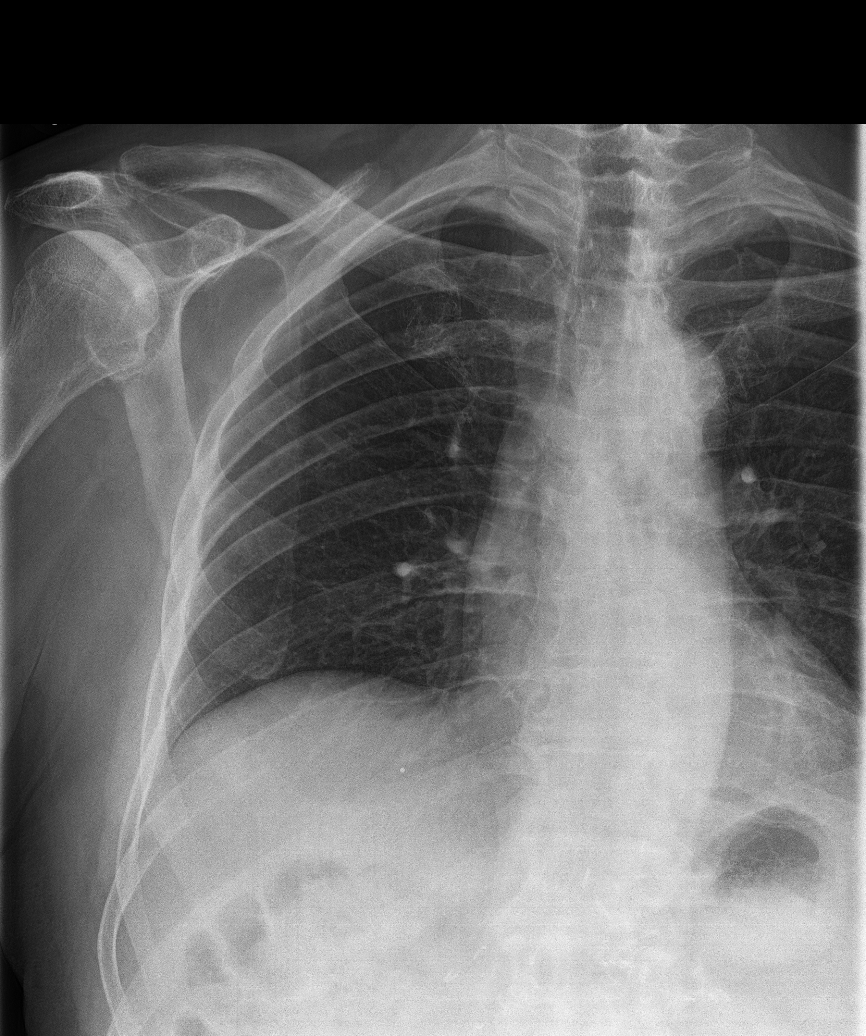
[im 2/4]
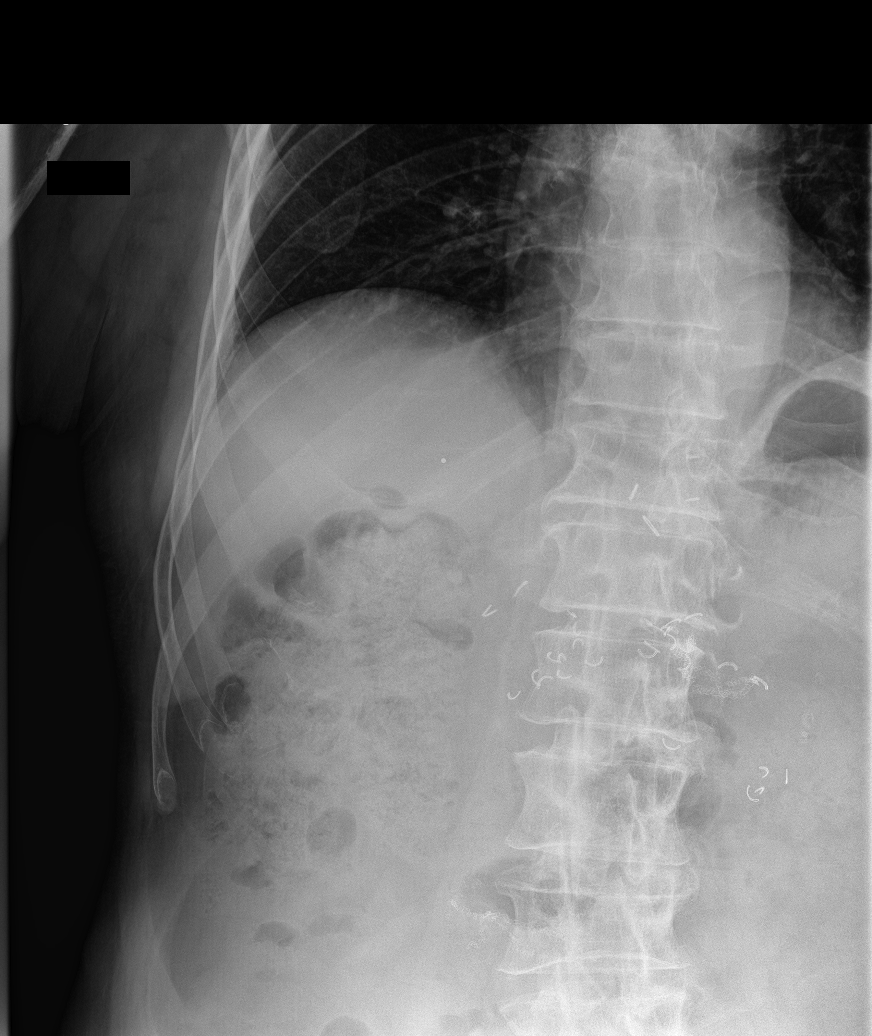
[im 3/4]
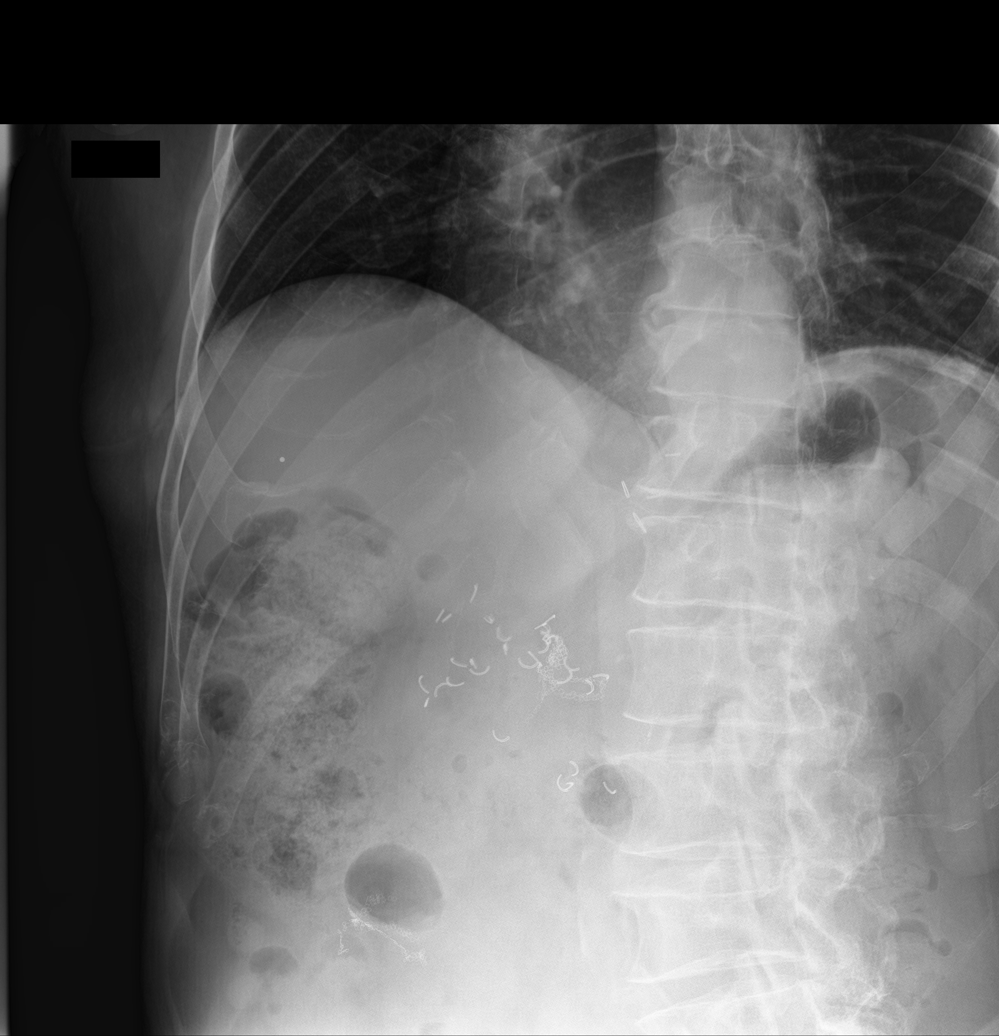
[im 4/4]
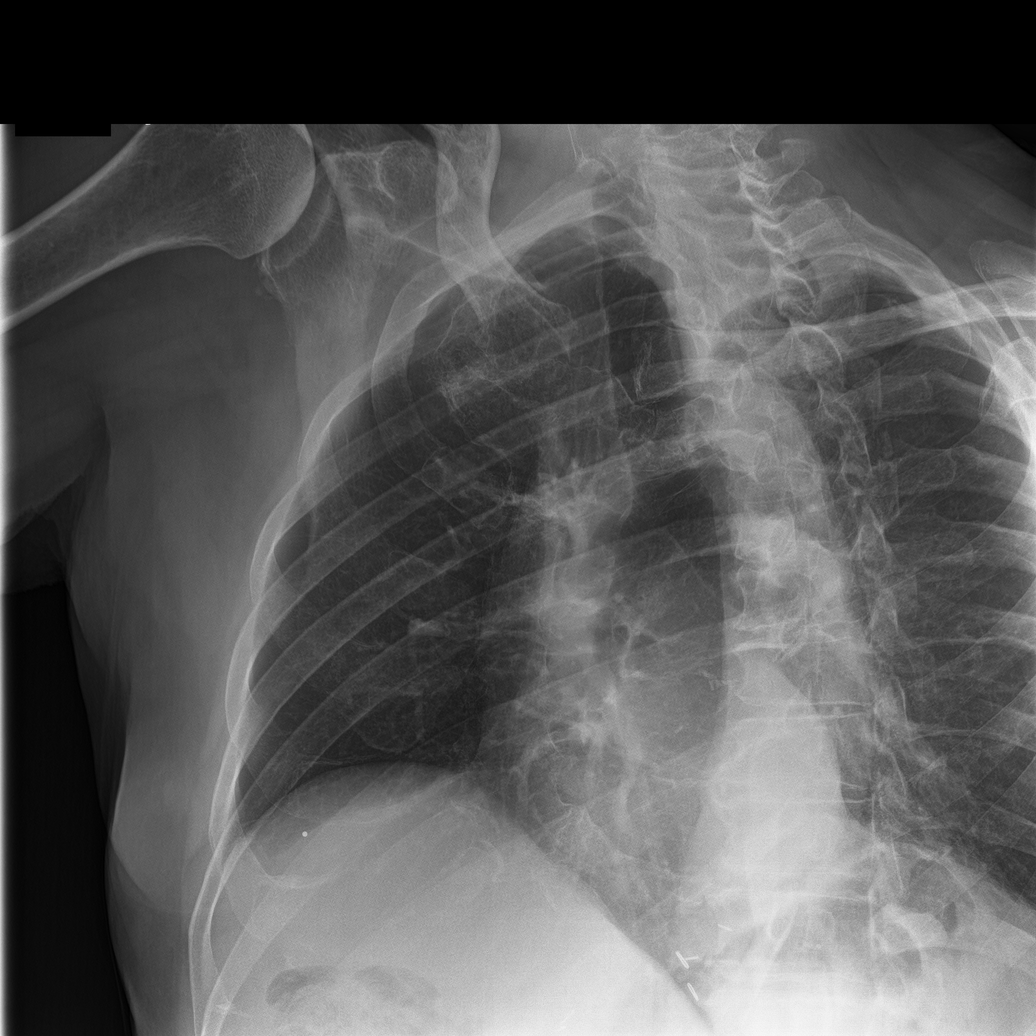

[4 of 4 positions shown; findings below may reference images not displayed]

FINDINGS: No displaced right rib fracture is seen.

Visualized lungs are clear.

The heart is normal in size.

Postsurgical changes overlying the upper abdomen.
IMPRESSION: No displaced right rib fracture is seen.

## 2019-12-27 ENCOUNTER — Other Ambulatory Visit: Payer: Self-pay

## 2019-12-27 ENCOUNTER — Inpatient Hospital Stay: Payer: Medicare Other

## 2019-12-27 ENCOUNTER — Telehealth: Payer: Self-pay | Admitting: Pharmacy Technician

## 2019-12-27 ENCOUNTER — Inpatient Hospital Stay: Payer: Medicare Other | Admitting: Oncology

## 2019-12-27 ENCOUNTER — Encounter: Payer: Self-pay | Admitting: Oncology

## 2019-12-27 ENCOUNTER — Telehealth: Payer: Self-pay

## 2019-12-27 VITALS — BP 137/82 | HR 64 | Temp 98.6°F | Resp 18 | Wt 224.6 lb

## 2019-12-27 DIAGNOSIS — D72820 Lymphocytosis (symptomatic): Secondary | ICD-10-CM

## 2019-12-27 DIAGNOSIS — Z86711 Personal history of pulmonary embolism: Secondary | ICD-10-CM

## 2019-12-27 DIAGNOSIS — I87009 Postthrombotic syndrome without complications of unspecified extremity: Secondary | ICD-10-CM

## 2019-12-27 DIAGNOSIS — Z1589 Genetic susceptibility to other disease: Secondary | ICD-10-CM

## 2019-12-27 DIAGNOSIS — D45 Polycythemia vera: Secondary | ICD-10-CM

## 2019-12-27 DIAGNOSIS — Z7901 Long term (current) use of anticoagulants: Secondary | ICD-10-CM | POA: Diagnosis not present

## 2019-12-27 LAB — CBC WITH DIFFERENTIAL/PLATELET
Abs Immature Granulocytes: 0.15 10*3/uL — ABNORMAL HIGH (ref 0.00–0.07)
Basophils Absolute: 0.1 10*3/uL (ref 0.0–0.1)
Basophils Relative: 1 %
Eosinophils Absolute: 0.1 10*3/uL (ref 0.0–0.5)
Eosinophils Relative: 1 %
HCT: 50.4 % (ref 39.0–52.0)
Hemoglobin: 15.8 g/dL (ref 13.0–17.0)
Immature Granulocytes: 1 %
Lymphocytes Relative: 25 %
Lymphs Abs: 2.6 10*3/uL (ref 0.7–4.0)
MCH: 25.7 pg — ABNORMAL LOW (ref 26.0–34.0)
MCHC: 31.3 g/dL (ref 30.0–36.0)
MCV: 82.1 fL (ref 80.0–100.0)
Monocytes Absolute: 1.1 10*3/uL — ABNORMAL HIGH (ref 0.1–1.0)
Monocytes Relative: 11 %
Neutro Abs: 6.5 10*3/uL (ref 1.7–7.7)
Neutrophils Relative %: 61 %
Platelets: 257 10*3/uL (ref 150–400)
RBC: 6.14 MIL/uL — ABNORMAL HIGH (ref 4.22–5.81)
RDW: 20.9 % — ABNORMAL HIGH (ref 11.5–15.5)
WBC: 10.5 10*3/uL (ref 4.0–10.5)
nRBC: 0 % (ref 0.0–0.2)

## 2019-12-27 LAB — COMPREHENSIVE METABOLIC PANEL
ALT: 15 U/L (ref 0–44)
AST: 15 U/L (ref 15–41)
Albumin: 3.7 g/dL (ref 3.5–5.0)
Alkaline Phosphatase: 35 U/L — ABNORMAL LOW (ref 38–126)
Anion gap: 7 (ref 5–15)
BUN: 12 mg/dL (ref 8–23)
CO2: 29 mmol/L (ref 22–32)
Calcium: 8.6 mg/dL — ABNORMAL LOW (ref 8.9–10.3)
Chloride: 96 mmol/L — ABNORMAL LOW (ref 98–111)
Creatinine, Ser: 1.25 mg/dL — ABNORMAL HIGH (ref 0.61–1.24)
GFR, Estimated: >60 mL/min (ref >60)
Glucose, Bld: 106 mg/dL — ABNORMAL HIGH (ref 70–99)
Potassium: 4.3 mmol/L (ref 3.5–5.1)
Sodium: 132 mmol/L — ABNORMAL LOW (ref 135–145)
Total Bilirubin: 1.1 mg/dL (ref 0.3–1.2)
Total Protein: 5.7 g/dL — ABNORMAL LOW (ref 6.5–8.1)

## 2019-12-27 MED ORDER — HYDROXYUREA 500 MG PO CAPS: ORAL_CAPSULE | ORAL | 0 refills | Status: DC

## 2019-12-27 MED ORDER — APIXABAN 2.5 MG PO TABS
2.5000 mg | ORAL_TABLET | Freq: Two times a day (BID) | ORAL | 3 refills | Status: DC
Start: 2019-12-27 — End: 2020-06-22

## 2019-12-27 NOTE — Progress Notes (Signed)
Hematology/Oncology follow up note [facility] [phone] [phone]   Patient Care Team: Birdie Sons, MD as PCP - General (Family Medicine) Lorelee Cover., MD as Consulting Physician (Ophthalmology) Quintin Alto, MD as Referring Physician (Rheumatology) Dorene Ar, MD (Pain Medicine) Ubaldo Glassing Javier Docker, MD as Consulting Physician (Cardiology) Earlie Server, MD as Consulting Physician (Oncology)  REFERRING PROVIDER: Birdie Sons, MD  CHIEF COMPLAINTS/REASON FOR VISIT:  Follow-up for erythrocytosis  HISTORY OF PRESENTING ILLNESS:   Edward Mejia is a  74 y.o.  male with PMH including rheumatoid arthritis, peripheral vascular disease hypertension, Covid infection history,, was seen in consultation at the request of  Fisher, Kirstie Peri, MD  for evaluation of polycytosis Patient was accompanied by her wife who participated in providing medical history. February 2021, patient was hospitalized due to COVID-19 pneumonia, respiratory failure, treated with steroids, remdesivir, received bamlanivimab.  Hospitalization was also completed by [facility], bleeding was  thought to be secondary to hemorrhoids.  He was discharged on 04/07/2019 and presented back to emergency room on 04/12/2019 due to left lower extremity swelling, acute hypoxic respiratory failure.  Work-up showed acute bilateral pulmonary embolism, large left lower extremity DVT, thrombosis and IVC and left external iliac vein.  Patient was started on anticoagulation and transition to Eliquis Patient has been on Eliquis since March 2021.  Patient also takes aspirin.  Patient has history of CAD, follows up with Dr. Ubaldo Glassing.  Rheumatoid arthritis on Enbrel once a week.  Also on prednisone 7.5 mg daily. Patient has also been found to have erythrocytosis. 09/20/2019, CBC showed hemoglobin of 17.6, reviewed previous medical records, erythrocytosis can be traced back to at least 2017.  Patient also has  leukocytosis since March 2021.  Leukocytosis has trended down recently and CBC on 09/20/2019 showed a white count of 10.8, predominantly monocytosis.  Patient is a former smoker, 10-12-pack-year smoking history and he stopped smoking in 1984. Currently he has some chronic shortness of breath with exertion.  He has lost weight during his previous hospitalization and has gained weight since discharge.  Sometimes he feels sweaty.  Patient also reports intermittent dizziness/lightheaded episodes for the past couple of months.  He actually had an episode during today's visit and is spontaneously resolved after few minutes.  Denies any shortness of breath, chest pain, vision changes, focal deficits.  Patient has been referred to establish care with neurology for further evaluation.  INTERVAL HISTORY Edward Mejia is a 74 y.o. male who has above history reviewed by me today presents for follow up visit for management of polycythemia vera Patient has been on Eliquis 2.5 mg twice daily.  No bleeding events. He reports left lower extremity swelling and sometimes he felt a knot.  Swelling gets better after leg elevation. Patient is on hydroxyurea 1000 mg on Mondays and Thursdays and 500 mg on the rest of the days .  He tolerates well .   Review of Systems  Constitutional: Negative for appetite change, chills, fatigue, fever and unexpected weight change.  HENT:   Negative for hearing loss and voice change.   Eyes: Negative for eye problems and icterus.  Respiratory: Negative for chest tightness, cough and shortness of breath.   Cardiovascular: Negative for chest pain and leg swelling.  Gastrointestinal: Negative for abdominal distention, abdominal pain and blood in stool.  Endocrine: Negative for hot flashes.  Genitourinary: Negative for difficulty urinating, dysuria and frequency.   Musculoskeletal: Negative for arthralgias.  Left lower extremity swelling  Skin: Negative for itching and rash.   Neurological: Negative for extremity weakness, light-headedness and numbness.  Hematological: Negative for adenopathy. Does not bruise/bleed easily.  Psychiatric/Behavioral: Negative for confusion.    MEDICAL HISTORY:  Past Medical History:  Diagnosis Date  . Arthritis   . Bilateral pulmonary embolism ([place]) 03/2019   in setting of Covid-19 pneumonia  . Cancer ([place])    skin cancer  . Cellulitis   . Cellulitis   . DVT (deep venous thrombosis) ([place]) 03/2019   in setting of Covid-19 pneumonia  . Erythrocytosis 09/20/2019  . H/O adenomatous polyp of colon 03/24/2015  . History of brain disorder: history of amaurosis fugax 03/24/2015  . History of pneumonia 04/04/2008  . History of rheumatic fever 03/24/2015   DID Have Rheumatic Fever.    . Hypertension   . Peptic ulcer disease   . Sepsis ([place]) 04/12/2019   secondary to Covid pneumonia    SURGICAL HISTORY: Past Surgical History:  Procedure Laterality Date  . APPENDECTOMY    . LEFT HEART CATH AND CORONARY ANGIOGRAPHY Left 02/06/2018   Procedure: LEFT HEART CATH AND CORONARY ANGIOGRAPHY;  Surgeon: Teodoro Spray, MD;  Location: [place] [facility];  Service: Cardiovascular;  Laterality: Left;  Marland Kitchen MANDIBLE FRACTURE SURGERY     fractured jaw  . PARTIAL GASTRECTOMY     peptic ulcer disease    SOCIAL HISTORY: Social History   Socioeconomic History  . Marital status: Married    Spouse name: Lovey Newcomer   . Number of children: 4  . Years of education: Not on file  . Highest education level: 8th grade  Occupational History  . Occupation: Disabled    Comment: due to Psoriatic Arthritis  Tobacco Use  . Smoking status: Former Smoker    Quit date: 09/20/1982    Years since quitting: 37.2  . Smokeless tobacco: Never Used  . Tobacco comment: quit over 40+ years ago  Vaping Use  . Vaping Use: Never used  Substance and Sexual Activity  . Alcohol use: No    Alcohol/week: 0.0 standard drinks  . Drug use: No  . Sexual activity: Not on  file  Other Topics Concern  . Not on file  Social History Narrative  . Not on file   Social Determinants of Health   Financial Resource Strain: Low Risk   . Difficulty of Paying Living Expenses: Not hard at all  Food Insecurity:   . Worried About Charity fundraiser in the Last Year: Not on file  . Ran Out of Food in the Last Year: Not on file  Transportation Needs:   . Lack of Transportation (Medical): Not on file  . Lack of Transportation (Non-Medical): Not on file  Physical Activity: Unknown  . Days of Exercise per Week: Not on file  . Minutes of Exercise per Session: 0 min  Stress: No Stress Concern Present  . Feeling of Stress : Not at all  Social Connections: Unknown  . Frequency of Communication with Friends and Family: Patient refused  . Frequency of Social Gatherings with Friends and Family: Patient refused  . Attends Religious Services: Patient refused  . Active Member of Clubs or Organizations: Patient refused  . Attends Archivist Meetings: Patient refused  . Marital Status: Patient refused  Intimate Partner Violence: Unknown  . Fear of Current or Ex-Partner: Patient refused  . Emotionally Abused: Patient refused  . Physically Abused: Patient refused  . Sexually Abused: Patient refused  FAMILY HISTORY: Family History  Problem Relation Age of Onset  . Cancer Mother        lung cancer  . Heart disease Father 49       heart attack  . Cancer Brother        pancreatic cancer  . Cancer Brother        pancreatic cancer    ALLERGIES:  is allergic to pravachol [pravastatin].  MEDICATIONS:  Current Outpatient Medications  Medication Sig Dispense Refill  . acetaminophen (TYLENOL) 500 MG tablet Take 1,000 mg by mouth 3 (three) times daily as needed for mild pain or headache.     . albuterol (PROVENTIL HFA) 108 (90 Base) MCG/ACT inhaler Inhale 2 puffs into the lungs every 6 (six) hours as needed for wheezing or shortness of breath.    Marland Kitchen apixaban  (ELIQUIS) 2.5 MG TABS tablet Take 1 tablet (2.5 mg total) by mouth 2 (two) times daily. 60 tablet 3  . aspirin EC 81 MG tablet Take 81 mg by mouth daily. Swallow whole.    . etanercept (ENBREL) 50 MG/ML injection Inject 50 mg into the skin every Thursday.     . gabapentin (NEURONTIN) 300 MG capsule Take 900 mg by mouth See admin instructions.     Marland Kitchen HYDROcodone-acetaminophen (NORCO) 7.5-325 MG tablet Take 1 tablet by mouth 3 (three) times daily as needed for moderate pain. (Patient taking differently: Take 1 tablet by mouth every 6 (six) hours. ) 90 tablet 0  . hydrocortisone 2.5 % cream Apply topically 2 (two) times daily. As needed 30 g 3  . hydroxyurea (HYDREA) 500 MG capsule Take 1033m ( 2 tablets ) on Mondays, Wednesdays, Fridays and Sundays. Take 500 mg (1 tablet) other days). May take with food to minimize GI side effects. 180 capsule 0  . isosorbide mononitrate (IMDUR) 120 MG 24 hr tablet Take 120 mg by mouth daily.     . predniSONE (DELTASONE) 5 MG tablet Take 7.5 mg by mouth every morning.    . ranolazine (RANEXA) 500 MG 12 hr tablet Take 500 mg by mouth in the morning and at bedtime.    . valsartan-hydrochlorothiazide (DIOVAN-HCT) 160-12.5 MG tablet Take 1 tablet by mouth daily. 90 tablet 3  . nitroGLYCERIN (NITROSTAT) 0.4 MG SL tablet Place 0.4 mg under the tongue every 5 (five) minutes as needed for chest pain.  (Patient not taking: Reported on 11/29/2019)     No current facility-administered medications for this visit.     PHYSICAL EXAMINATION: ECOG PERFORMANCE STATUS: 1 - Symptomatic but completely ambulatory Today's Vitals   12/27/19 0945  BP: 137/82  Pulse: 64  Resp: 18  Temp: 98.6 F (37 C)  Weight: 224 lb 9.6 oz (101.9 kg)  PainSc: 0-No pain   Body mass index is 28.84 kg/m.  .Marland KitchenPhysical Exam Constitutional:      General: He is not in acute distress. HENT:     Head: Normocephalic and atraumatic.  Eyes:     General: No scleral icterus. Cardiovascular:     Rate  and Rhythm: Normal rate and regular rhythm.     Heart sounds: Normal heart sounds.  Pulmonary:     Effort: Pulmonary effort is normal. No respiratory distress.     Breath sounds: No wheezing.  Abdominal:     General: Bowel sounds are normal. There is no distension.     Palpations: Abdomen is soft.  Musculoskeletal:        General: No deformity. Normal range of motion.  Cervical back: Normal range of motion and neck supple.     Comments: Left foot swelling and blanchable erythema.  Skin:    General: Skin is warm and dry.     Findings: No erythema or rash.  Neurological:     Mental Status: He is alert and oriented to person, place, and time. Mental status is at baseline.     Cranial Nerves: No cranial nerve deficit.     Coordination: Coordination normal.  Psychiatric:        Mood and Affect: Mood normal.     LABORATORY DATA:  I have reviewed the data as listed Lab Results  Component Value Date   WBC 10.5 12/27/2019   HGB 15.8 12/27/2019   HCT 50.4 12/27/2019   MCV 82.1 12/27/2019   PLT 257 12/27/2019   Recent Labs    02/22/19 1057 04/12/19 1045 04/13/19 0418 04/16/19 0243 04/16/19 0243 04/26/19 1545 09/16/19 1058 09/17/19 1049 11/12/19 1315 11/12/19 1315 11/29/19 1252 12/16/19 1404 12/27/19 0922  NA   < > 131*   < > 134*   < > 133*  --  133* 133*  --  133*  --  132*  K   < > 5.1   < > 4.3   < > 4.8  --  4.6 4.7  --  3.7  --  4.3  CL   < > 101   < > 95*   < > 95*  --  93* 96*  --  97*  --  96*  CO2   < > 16*   < > 26   < > 23  --  23 29  --  29  --  29  GLUCOSE   < > 102*   < > 75   < > 125*  --  61* 97  --  114*  --  106*  BUN   < > 17   < > 18   < > 10  --  12 15  --  13  --  12  CREATININE   < > 0.91   < > 1.05   < > 1.17   < > 1.23 1.02  --  1.15  --  1.25*  CALCIUM   < > 8.0*   < > 7.9*   < > 8.4*  --  9.1 8.5*  --  8.2*  --  8.6*  GFRNONAA   < > >60   < > >60   < > 61  --  57* >60  --  >60  --  >60  GFRAA  --  >60   < > >60  --  71  --  66  --   --   --    --   --   PROT   < > 5.9*   < > 5.0*   < > 5.7*  --  6.2 6.3*   < > 5.9* 6.4* 5.7*  ALBUMIN   < > 2.6*   < > 2.4*   < > 3.5*  --  4.2 3.9   < > 3.8 4.0 3.7  AST   < > 29   < > 26   < > 22  --  14 15   < > _0 ALT   < > 23   < > 32   < > 17  --  13 15   < > _1 ALKPHOS   < >  49   < > 47   < > 79  --  51 32*   < > 38 35* 35*  BILITOT   < > 2.6*   < > 1.5*   < > 1.3*  --  1.0 1.4*   < > 1.1 1.5* 1.1  BILIDIR  --  0.4*  --   --   --   --   --   --   --   --   --  0.3*  --   IBILI  --  2.2*  --   --   --   --   --   --   --   --   --  1.2*  --    < > = values in this interval not displayed.   Iron/TIBC/Ferritin/ %Sat    Component Value Date/Time   IRON 49 09/20/2019 1201   TIBC 318 09/20/2019 1201   FERRITIN 24 09/20/2019 1201   IRONPCTSAT 15 (L) 09/20/2019 1201      RADIOGRAPHIC STUDIES: I have personally reviewed the radiological images as listed and agreed with the findings in the report. No results found.    ASSESSMENT & PLAN:  1. JAK2 V617F mutation   2. Polycythemia vera ([place])   3. History of pulmonary embolism   4. Monoclonal B-cell lymphocytosis of undetermined significance   5. Post-thrombotic syndrome    #Polycythemia vera,  JAK2 V617F mutation. Continue hydroxyurea Labs reviewed and discussed with patient Hematocrit is 50.4. I recommend patient to increase hydroxyurea to 1000 mg on M,W,F,S, take 500 mg on T and Th. A new prescription was sent to pharmacy. Repeat CBC in 2 weeks  Goal of hematocrit is less than 45. Patient will follow up with me in 4 weeks for further titration of hydroxyurea dosage Continue aspirin 81 mg daily.  For history of thrombosis, continue Eliquis 2.5 mg twice daily. #Chronic left lower extremity swelling,  due to post thrombotic syndrome compression stocking during awake time.  Encourage leg elevation  # monoclonal lymphocytosis of unknown significance.  By flow cytometry and bone marrow biopsy Continue  observation . Orders Placed This Encounter  Procedures  . CBC with Differential/Platelet    Standing Status:   Future    Standing Expiration Date:   12/26/2020  . CBC with Differential/Platelet    Standing Status:   Future    Standing Expiration Date:   12/26/2020  . Comprehensive metabolic panel    Standing Status:   Future    Standing Expiration Date:   12/26/2020    All questions were answered. The patient knows to call the clinic with any problems questions or concerns.  cc Birdie Sons, MD  Follow-up 4 weeks Earlie Server, MD, PhD Hematology Oncology [facility] Pager- [phone] 12/27/2019

## 2019-12-27 NOTE — Telephone Encounter (Signed)
-----   Message from Earlie Server, MD sent at 12/27/2019  1:44 PM EST ----- Creatinine is slightly increased, slightly low sodium. Please ask him to increase oral hydration.

## 2019-12-27 NOTE — Progress Notes (Signed)
PT reports that he has some swelling and knots that appear at night to left ankle.

## 2019-12-27 NOTE — Telephone Encounter (Signed)
Oral Oncology Patient Advocate Encounter  Met patient in consult room to complete application for [employer] in an effort to reduce patient's out of pocket expense for Eliquis to $0.    Application completed and faxed to [phone].   [place] Patient [employer] phone number for follow up is [phone].   This encounter will be updated until final determination.   [place] Patient [place] Phone [phone] Fax [phone] 12/30/2019 2:06 PM

## 2019-12-27 NOTE — Telephone Encounter (Signed)
Patient's wife notified and she will let patient know.

## 2020-01-10 ENCOUNTER — Inpatient Hospital Stay: Payer: Medicare Other | Attending: Oncology

## 2020-01-10 DIAGNOSIS — D45 Polycythemia vera: Secondary | ICD-10-CM | POA: Insufficient documentation

## 2020-01-10 DIAGNOSIS — Z86711 Personal history of pulmonary embolism: Secondary | ICD-10-CM | POA: Diagnosis not present

## 2020-01-10 DIAGNOSIS — Z7982 Long term (current) use of aspirin: Secondary | ICD-10-CM | POA: Diagnosis not present

## 2020-01-10 DIAGNOSIS — Z79899 Other long term (current) drug therapy: Secondary | ICD-10-CM | POA: Diagnosis not present

## 2020-01-10 LAB — CBC WITH DIFFERENTIAL/PLATELET
Abs Immature Granulocytes: 0.2 10*3/uL — ABNORMAL HIGH (ref 0.00–0.07)
Basophils Absolute: 0.1 10*3/uL (ref 0.0–0.1)
Basophils Relative: 1 %
Eosinophils Absolute: 0.1 10*3/uL (ref 0.0–0.5)
Eosinophils Relative: 1 %
HCT: 49.6 % (ref 39.0–52.0)
Hemoglobin: 15.8 g/dL (ref 13.0–17.0)
Immature Granulocytes: 2 %
Lymphocytes Relative: 24 %
Lymphs Abs: 2.4 10*3/uL (ref 0.7–4.0)
MCH: 26.7 pg (ref 26.0–34.0)
MCHC: 31.9 g/dL (ref 30.0–36.0)
MCV: 83.8 fL (ref 80.0–100.0)
Monocytes Absolute: 1.1 10*3/uL — ABNORMAL HIGH (ref 0.1–1.0)
Monocytes Relative: 11 %
Neutro Abs: 6.2 10*3/uL (ref 1.7–7.7)
Neutrophils Relative %: 61 %
Platelets: 196 10*3/uL (ref 150–400)
RBC: 5.92 MIL/uL — ABNORMAL HIGH (ref 4.22–5.81)
RDW: 21.2 % — ABNORMAL HIGH (ref 11.5–15.5)
WBC: 10 10*3/uL (ref 4.0–10.5)
nRBC: 0 % (ref 0.0–0.2)

## 2020-01-10 NOTE — Telephone Encounter (Signed)
Oral Oncology Patient Advocate Encounter  Received notification from [place] Patient [facility] that patient has been successfully enrolled into their program to receive Eliquis from the manufacturer at $0 out of pocket until  02/07/20.    I called and spoke with patient.  He knows we will have to re-apply.   Specialty Pharmacy that will dispense medication is [facility].  Patient knows to call the office with questions or concerns.   Oral Oncology Clinic will continue to follow.  [place] Patient [place] Phone [phone] Fax [phone] 01/10/2020 1:25 PM

## 2020-01-14 DIAGNOSIS — M5136 Other intervertebral disc degeneration, lumbar region: Secondary | ICD-10-CM | POA: Diagnosis not present

## 2020-01-14 DIAGNOSIS — M4726 Other spondylosis with radiculopathy, lumbar region: Secondary | ICD-10-CM | POA: Diagnosis not present

## 2020-01-14 DIAGNOSIS — M5459 Other low back pain: Secondary | ICD-10-CM | POA: Diagnosis not present

## 2020-01-14 DIAGNOSIS — M48061 Spinal stenosis, lumbar region without neurogenic claudication: Secondary | ICD-10-CM | POA: Diagnosis not present

## 2020-01-14 NOTE — Progress Notes (Deleted)
Subjective:   Edward Mejia is a 74 y.o. male who presents for an Initial Medicare Annual Wellness Visit.  I connected with Edward Mejia today by telephone and verified that I am speaking with the correct person using two identifiers. Location patient: home Location provider: work Persons participating in the virtual visit: patient, provider.   I discussed the limitations, risks, security and privacy concerns of performing an evaluation and management service by telephone and the availability of in person appointments. I also discussed with the patient that there may be a patient responsible charge related to this service. The patient expressed understanding and verbally consented to this telephonic visit.    Interactive audio and video telecommunications were attempted between this provider and patient, however failed, due to patient having technical difficulties OR patient did not have access to video capability.  We continued and completed visit with audio only.   Review of Systems    N/A        Objective:    There were no vitals filed for this visit. There is no height or weight on file to calculate BMI.  Advanced Directives 12/27/2019 11/29/2019 10/29/2019 10/04/2019 09/20/2019 04/12/2019 04/12/2019  Does Patient Have a Medical Advance Directive? Yes Yes Yes Yes Yes Yes Yes  Type of Advance Directive Living will;Healthcare Power of Attorney Living will;Healthcare Power of Attorney Living will;Healthcare Power of [place];Living will [place];Living will [place];Living will -  Does patient want to make changes to medical advance directive? - - - - - No - Patient declined -  Copy of Edward Mejia in Chart? - No - copy requested - No - copy requested - No - copy requested -  Would patient like information on creating a medical advance directive? - - - - - No - Patient declined -    Current  Medications (verified) Outpatient Encounter Medications as of 01/15/2020  Medication Sig  . acetaminophen (TYLENOL) 500 MG tablet Take 1,000 mg by mouth 3 (three) times daily as needed for mild pain or headache.   . albuterol (PROVENTIL HFA) 108 (90 Base) MCG/ACT inhaler Inhale 2 puffs into the lungs every 6 (six) hours as needed for wheezing or shortness of breath.  Marland Kitchen apixaban (ELIQUIS) 2.5 MG TABS tablet Take 1 tablet (2.5 mg total) by mouth 2 (two) times daily.  Marland Kitchen aspirin EC 81 MG tablet Take 81 mg by mouth daily. Swallow whole.  . etanercept (ENBREL) 50 MG/ML injection Inject 50 mg into the skin every Thursday.   . gabapentin (NEURONTIN) 300 MG capsule Take 900 mg by mouth See admin instructions.   Marland Kitchen HYDROcodone-acetaminophen (NORCO) 7.5-325 MG tablet Take 1 tablet by mouth 3 (three) times daily as needed for moderate pain. (Patient taking differently: Take 1 tablet by mouth every 6 (six) hours. )  . hydrocortisone 2.5 % cream Apply topically 2 (two) times daily. As needed  . hydroxyurea (HYDREA) 500 MG capsule Take 1000mg  ( 2 tablets ) on Mondays, Wednesdays, Fridays and Sundays. Take 500 mg (1 tablet) other days). May take with food to minimize GI side effects.  . isosorbide mononitrate (IMDUR) 120 MG 24 hr tablet Take 120 mg by mouth daily.   . nitroGLYCERIN (NITROSTAT) 0.4 MG SL tablet Place 0.4 mg under the tongue every 5 (five) minutes as needed for chest pain.  (Patient not taking: Reported on 11/29/2019)  . predniSONE (DELTASONE) 5 MG tablet Take 7.5 mg by mouth every morning.  Marland Kitchen  ranolazine (RANEXA) 500 MG 12 hr tablet Take 500 mg by mouth in the morning and at bedtime.  . valsartan-hydrochlorothiazide (DIOVAN-HCT) 160-12.5 MG tablet Take 1 tablet by mouth daily.   No facility-administered encounter medications on file as of 01/15/2020.    Allergies (verified) Pravachol [pravastatin]   History: Past Medical History:  Diagnosis Date  . Arthritis   . Bilateral pulmonary embolism  ([place]) 03/2019   in setting of Covid-19 pneumonia  . Cancer ([place])    skin cancer  . Cellulitis   . Cellulitis   . DVT (deep venous thrombosis) ([place]) 03/2019   in setting of Covid-19 pneumonia  . Erythrocytosis 09/20/2019  . H/O adenomatous polyp of colon 03/24/2015  . History of brain disorder: history of amaurosis fugax 03/24/2015  . History of pneumonia 04/04/2008  . History of rheumatic fever 03/24/2015   DID Have Rheumatic Fever.    . Hypertension   . Peptic ulcer disease   . Sepsis ([place]) 04/12/2019   secondary to Covid pneumonia   Past Surgical History:  Procedure Laterality Date  . APPENDECTOMY    . LEFT HEART CATH AND CORONARY ANGIOGRAPHY Left 02/06/2018   Procedure: LEFT HEART CATH AND CORONARY ANGIOGRAPHY;  Surgeon: Teodoro Spray, MD;  Location: [facility];  Service: Cardiovascular;  Laterality: Left;  Marland Kitchen MANDIBLE FRACTURE SURGERY     fractured jaw  . PARTIAL GASTRECTOMY     peptic ulcer disease   Family History  Problem Relation Age of Onset  . Cancer Mother        lung cancer  . Heart disease Father 73       heart attack  . Cancer Brother        pancreatic cancer  . Cancer Brother        pancreatic cancer   Social History   Socioeconomic History  . Marital status: Married    Spouse name: Edward Mejia   . Number of children: 4  . Years of education: Not on file  . Highest education level: 8th grade  Occupational History  . Occupation: Disabled    Comment: due to Psoriatic Arthritis  Tobacco Use  . Smoking status: Former Smoker    Quit date: 09/20/1982    Years since quitting: 37.3  . Smokeless tobacco: Never Used  . Tobacco comment: quit over 40+ years ago  Vaping Use  . Vaping Use: Never used  Substance and Sexual Activity  . Alcohol use: No    Alcohol/week: 0.0 standard drinks  . Drug use: No  . Sexual activity: Not on file  Other Topics Concern  . Not on file  Social History Narrative  . Not on file   Social Determinants of Health    Financial Resource Strain:   . Difficulty of Paying Living Expenses: Not on file  Food Insecurity:   . Worried About Charity fundraiser in the Last Year: Not on file  . Ran Out of Food in the Last Year: Not on file  Transportation Needs:   . Lack of Transportation (Medical): Not on file  . Lack of Transportation (Non-Medical): Not on file  Physical Activity:   . Days of Exercise per Week: Not on file  . Minutes of Exercise per Session: Not on file  Stress:   . Feeling of Stress : Not on file  Social Connections:   . Frequency of Communication with Friends and Family: Not on file  . Frequency of Social Gatherings with Friends and Family: Not on  file  . Attends Religious Services: Not on file  . Active Member of Clubs or Organizations: Not on file  . Attends Archivist Meetings: Not on file  . Marital Status: Not on file    Tobacco Counseling Counseling given: Not Answered Comment: quit over 40+ years ago   Clinical Intake:                 Diabetic? No         Activities of Daily Living In your present state of health, do you have any difficulty performing the following activities: 10/16/2019 04/12/2019  Hearing? N N  Vision? N N  Difficulty concentrating or making decisions? N Y  Walking or climbing stairs? N N  Dressing or bathing? N Y  Doing errands, shopping? - N  Some recent data might be hidden    Patient Care Team: Birdie Sons, MD as PCP - General (Family Medicine) Lorelee Cover., MD as Consulting Physician (Ophthalmology) Quintin Alto, MD as Referring Physician (Rheumatology) Dorene Ar, MD (Pain Medicine) Teodoro Spray, MD as Consulting Physician (Cardiology) Earlie Server, MD as Consulting Physician (Oncology)  Indicate any recent Medical Services you may have received from other than [facility] providers in the past year (date may be approximate).     Assessment:   This is a routine wellness examination for  Edward Mejia.  Hearing/Vision screen No exam data present  Dietary issues and exercise activities discussed:    Goals    . DIET - EAT MORE FRUITS AND VEGETABLES     Recommend to eat 2 servings of fruits and vegetables each day.     . Have 3 meals a day     Recommend eating 3 small meals every day with 2 healthy snacks in between.       Depression Screen PHQ 2/9 Scores 01/08/2019 12/14/2016 12/14/2016 08/25/2015  PHQ - 2 Score 0 0 0 0  PHQ- 9 Score - 3 - 3    Fall Risk Fall Risk  01/08/2019 12/14/2016 08/25/2015  Falls in the past year? 0 No No  Number falls in past yr: 0 - -  Injury with Fall? 0 - -    FALL RISK PREVENTION PERTAINING TO THE HOME:  Any stairs in or around the home? {YES/NO:21197} If so, are there any without handrails? {YES/NO:21197} Home free of loose throw rugs in walkways, pet beds, electrical cords, etc? Yes  Adequate lighting in your home to reduce risk of falls? Yes   ASSISTIVE DEVICES UTILIZED TO PREVENT FALLS:  Life alert? {YES/NO:21197} Use of a cane, walker or w/c? {YES/NO:21197} Grab bars in the bathroom? {YES/NO:21197} Shower chair or bench in shower? {YES/NO:21197} Elevated toilet seat or a handicapped toilet? {YES/NO:21197}   Cognitive Function:     6CIT Screen 01/08/2019  What Year? 0 points  What month? 0 points  What time? 0 points  Count back from 20 0 points  Months in reverse 0 points  Repeat phrase 0 points  Total Score 0    Immunizations Immunization History  Administered Date(s) Administered  . DTaP 01/20/2005  . Fluad Quad(high Dose 65+) 10/23/2018  . Influenza, High Dose Seasonal PF 11/28/2013, 11/26/2015, 12/14/2016, 12/07/2017  . Influenza-Unspecified 11/15/2012  . PFIZER SARS-COV-2 Vaccination 07/19/2019, 08/09/2019  . Pneumococcal Conjugate-13 12/14/2016  . Pneumococcal Polysaccharide-23 01/16/2002, 10/31/2011    TDAP status: Due, Education has been provided regarding the importance of this vaccine. Advised may  receive this vaccine at local pharmacy or Health Dept. Aware  to provide a copy of the vaccination record if obtained from local pharmacy or Health Dept. Verbalized acceptance and understanding.  {Flu Vaccine status:2101806}  Pneumococcal vaccine status: Up to date  Covid-19 vaccine status: Completed vaccines  Qualifies for Shingles Vaccine? Yes   Zostavax completed No   Shingrix Completed?: No.    Education has been provided regarding the importance of this vaccine. Patient has been advised to call insurance company to determine out of pocket expense if they have not yet received this vaccine. Advised may also receive vaccine at local pharmacy or Health Dept. Verbalized acceptance and understanding.  Screening Tests Health Maintenance  Topic Date Due  . TETANUS/TDAP  Never done  . COLONOSCOPY  02/11/2016  . INFLUENZA VACCINE  10/29/2023 (Originally 09/08/2019)  . COVID-19 Vaccine  Completed  . Hepatitis C Screening  Completed  . PNA vac Low Risk Adult  Completed    Health Maintenance  Health Maintenance Due  Topic Date Due  . TETANUS/TDAP  Never done  . COLONOSCOPY  02/11/2016    {Colorectal cancer screening:2101809}  Lung Cancer Screening: (Low Dose CT Chest recommended if Age 16-80 years, 30 pack-year currently smoking OR have quit w/in 15years.) {DOES NOT does:27190::"does not"} qualify.   Lung Cancer Screening Referral: ***  Additional Screening:  Hepatitis C Screening: Up to date  Vision Screening: Recommended annual ophthalmology exams for early detection of glaucoma and other disorders of the eye. Is the patient up to date with their annual eye exam?  Yes  Who is the provider or what is the name of the office in which the patient attends annual eye exams? *** If pt is not established with a provider, would they like to be referred to a provider to establish care? No .   Dental Screening: Recommended annual dental exams for proper oral hygiene  Community Resource  Referral / Chronic Care Management: CRR required this visit?  No   CCM required this visit?  No      Plan:     I have personally reviewed and noted the following in the patient's chart:   . Medical and social history . Use of alcohol, tobacco or illicit drugs  . Current medications and supplements . Functional ability and status . Nutritional status . Physical activity . Advanced directives . List of other physicians . Hospitalizations, surgeries, and ER visits in previous 12 months . Vitals . Screenings to include cognitive, depression, and falls . Referrals and appointments  In addition, I have reviewed and discussed with patient certain preventive protocols, quality metrics, and best practice recommendations. A written personalized care plan for preventive services as well as general preventive health recommendations were provided to patient.     Andersen Iorio [place], [place]   [id number]   Nurse Notes: ***

## 2020-01-15 ENCOUNTER — Other Ambulatory Visit: Payer: Self-pay

## 2020-01-20 DIAGNOSIS — I25119 Atherosclerotic heart disease of native coronary artery with unspecified angina pectoris: Secondary | ICD-10-CM | POA: Diagnosis not present

## 2020-01-20 DIAGNOSIS — I824Z2 Acute embolism and thrombosis of unspecified deep veins of left distal lower extremity: Secondary | ICD-10-CM | POA: Diagnosis not present

## 2020-01-20 DIAGNOSIS — I2699 Other pulmonary embolism without acute cor pulmonale: Secondary | ICD-10-CM | POA: Diagnosis not present

## 2020-01-20 DIAGNOSIS — I10 Essential (primary) hypertension: Secondary | ICD-10-CM | POA: Diagnosis not present

## 2020-01-21 DIAGNOSIS — D0472 Carcinoma in situ of skin of left lower limb, including hip: Secondary | ICD-10-CM | POA: Diagnosis not present

## 2020-01-21 DIAGNOSIS — D692 Other nonthrombocytopenic purpura: Secondary | ICD-10-CM | POA: Diagnosis not present

## 2020-01-21 DIAGNOSIS — Z85828 Personal history of other malignant neoplasm of skin: Secondary | ICD-10-CM | POA: Diagnosis not present

## 2020-01-21 DIAGNOSIS — L570 Actinic keratosis: Secondary | ICD-10-CM | POA: Diagnosis not present

## 2020-01-21 DIAGNOSIS — L821 Other seborrheic keratosis: Secondary | ICD-10-CM | POA: Diagnosis not present

## 2020-01-21 DIAGNOSIS — D044 Carcinoma in situ of skin of scalp and neck: Secondary | ICD-10-CM | POA: Diagnosis not present

## 2020-01-22 DIAGNOSIS — M1612 Unilateral primary osteoarthritis, left hip: Secondary | ICD-10-CM | POA: Diagnosis not present

## 2020-01-22 DIAGNOSIS — M25562 Pain in left knee: Secondary | ICD-10-CM | POA: Diagnosis not present

## 2020-01-22 DIAGNOSIS — M1712 Unilateral primary osteoarthritis, left knee: Secondary | ICD-10-CM | POA: Diagnosis not present

## 2020-01-22 DIAGNOSIS — Z79899 Other long term (current) drug therapy: Secondary | ICD-10-CM | POA: Diagnosis not present

## 2020-01-22 DIAGNOSIS — G8929 Other chronic pain: Secondary | ICD-10-CM | POA: Diagnosis not present

## 2020-01-22 DIAGNOSIS — L4050 Arthropathic psoriasis, unspecified: Secondary | ICD-10-CM | POA: Diagnosis not present

## 2020-01-22 DIAGNOSIS — M25552 Pain in left hip: Secondary | ICD-10-CM | POA: Diagnosis not present

## 2020-01-22 DIAGNOSIS — M25462 Effusion, left knee: Secondary | ICD-10-CM | POA: Diagnosis not present

## 2020-01-24 ENCOUNTER — Encounter: Payer: Self-pay | Admitting: Oncology

## 2020-01-24 ENCOUNTER — Inpatient Hospital Stay: Payer: Medicare Other

## 2020-01-24 ENCOUNTER — Inpatient Hospital Stay: Payer: Medicare Other | Admitting: Oncology

## 2020-01-24 ENCOUNTER — Other Ambulatory Visit: Payer: Self-pay

## 2020-01-24 VITALS — BP 167/81 | HR 66 | Temp 97.3°F | Resp 16 | Wt 226.3 lb

## 2020-01-24 DIAGNOSIS — Z1589 Genetic susceptibility to other disease: Secondary | ICD-10-CM

## 2020-01-24 DIAGNOSIS — Z86711 Personal history of pulmonary embolism: Secondary | ICD-10-CM

## 2020-01-24 DIAGNOSIS — D751 Secondary polycythemia: Secondary | ICD-10-CM

## 2020-01-24 DIAGNOSIS — D72820 Lymphocytosis (symptomatic): Secondary | ICD-10-CM

## 2020-01-24 DIAGNOSIS — D45 Polycythemia vera: Secondary | ICD-10-CM | POA: Diagnosis not present

## 2020-01-24 DIAGNOSIS — I87009 Postthrombotic syndrome without complications of unspecified extremity: Secondary | ICD-10-CM

## 2020-01-24 DIAGNOSIS — Z7982 Long term (current) use of aspirin: Secondary | ICD-10-CM | POA: Diagnosis not present

## 2020-01-24 DIAGNOSIS — Z79899 Other long term (current) drug therapy: Secondary | ICD-10-CM | POA: Diagnosis not present

## 2020-01-24 LAB — CBC WITH DIFFERENTIAL/PLATELET
Abs Immature Granulocytes: 0.11 10*3/uL — ABNORMAL HIGH (ref 0.00–0.07)
Basophils Absolute: 0.1 10*3/uL (ref 0.0–0.1)
Basophils Relative: 1 %
Eosinophils Absolute: 0.1 10*3/uL (ref 0.0–0.5)
Eosinophils Relative: 1 %
HCT: 52.1 % — ABNORMAL HIGH (ref 39.0–52.0)
Hemoglobin: 16.6 g/dL (ref 13.0–17.0)
Immature Granulocytes: 1 %
Lymphocytes Relative: 27 %
Lymphs Abs: 2.7 10*3/uL (ref 0.7–4.0)
MCH: 27.2 pg (ref 26.0–34.0)
MCHC: 31.9 g/dL (ref 30.0–36.0)
MCV: 85.3 fL (ref 80.0–100.0)
Monocytes Absolute: 0.9 10*3/uL (ref 0.1–1.0)
Monocytes Relative: 9 %
Neutro Abs: 6.3 10*3/uL (ref 1.7–7.7)
Neutrophils Relative %: 61 %
Platelets: 215 10*3/uL (ref 150–400)
RBC: 6.11 MIL/uL — ABNORMAL HIGH (ref 4.22–5.81)
RDW: 22.2 % — ABNORMAL HIGH (ref 11.5–15.5)
WBC: 10.2 10*3/uL (ref 4.0–10.5)
nRBC: 0 % (ref 0.0–0.2)

## 2020-01-24 LAB — COMPREHENSIVE METABOLIC PANEL
ALT: 14 U/L (ref 0–44)
AST: 14 U/L — ABNORMAL LOW (ref 15–41)
Albumin: 4.1 g/dL (ref 3.5–5.0)
Alkaline Phosphatase: 32 U/L — ABNORMAL LOW (ref 38–126)
Anion gap: 11 (ref 5–15)
BUN: 12 mg/dL (ref 8–23)
CO2: 27 mmol/L (ref 22–32)
Calcium: 8.7 mg/dL — ABNORMAL LOW (ref 8.9–10.3)
Chloride: 94 mmol/L — ABNORMAL LOW (ref 98–111)
Creatinine, Ser: 1.11 mg/dL (ref 0.61–1.24)
GFR, Estimated: >60 mL/min (ref >60)
Glucose, Bld: 89 mg/dL (ref 70–99)
Potassium: 4.5 mmol/L (ref 3.5–5.1)
Sodium: 132 mmol/L — ABNORMAL LOW (ref 135–145)
Total Bilirubin: 1.4 mg/dL — ABNORMAL HIGH (ref 0.3–1.2)
Total Protein: 6.4 g/dL — ABNORMAL LOW (ref 6.5–8.1)

## 2020-01-24 MED ORDER — HYDROXYUREA 500 MG PO CAPS: ORAL_CAPSULE | ORAL | 3 refills | Status: DC

## 2020-01-24 NOTE — Progress Notes (Signed)
Hematology/Oncology follow up note [facility] [phone] Fax:([phone]   Patient Care Team: Birdie Sons, MD as PCP - General (Family Medicine) Lorelee Cover., MD as Consulting Physician (Ophthalmology) Quintin Alto, MD as Referring Physician (Rheumatology) Dorene Ar, MD (Pain Medicine) Ubaldo Glassing Javier Docker, MD as Consulting Physician (Cardiology) Earlie Server, MD as Consulting Physician (Oncology)  REFERRING PROVIDER: Birdie Sons, MD  CHIEF COMPLAINTS/REASON FOR VISIT:  Follow-up for erythrocytosis  HISTORY OF PRESENTING ILLNESS:   Edward Mejia is a  74 y.o.  male with PMH including rheumatoid arthritis, peripheral vascular disease hypertension, Covid infection history,, was seen in consultation at the request of  Fisher, Kirstie Peri, MD  for evaluation of polycytosis Patient was accompanied by her wife who participated in providing medical history. February 2021, patient was hospitalized due to COVID-19 pneumonia, respiratory failure, treated with steroids, remdesivir, received bamlanivimab.  Hospitalization was also completed by [facility], bleeding was  thought to be secondary to hemorrhoids.  He was discharged on 04/07/2019 and presented back to emergency room on 04/12/2019 due to left lower extremity swelling, acute hypoxic respiratory failure.  Work-up showed acute bilateral pulmonary embolism, large left lower extremity DVT, thrombosis and IVC and left external iliac vein.  Patient was started on anticoagulation and transition to Eliquis Patient has been on Eliquis since March 2021.  Patient also takes aspirin.  Patient has history of CAD, follows up with Dr. Ubaldo Glassing.  Rheumatoid arthritis on Enbrel once a week.  Also on prednisone 7.5 mg daily. Patient has also been found to have erythrocytosis. 09/20/2019, CBC showed hemoglobin of 17.6, reviewed previous medical records, erythrocytosis can be traced back to at least 2017.  Patient also has  leukocytosis since March 2021.  Leukocytosis has trended down recently and CBC on 09/20/2019 showed a white count of 10.8, predominantly monocytosis.  Patient is a former smoker, 10-12-pack-year smoking history and he stopped smoking in 1984. Currently he has some chronic shortness of breath with exertion.  He has lost weight during his previous hospitalization and has gained weight since discharge.  Sometimes he feels sweaty.  Patient also reports intermittent dizziness/lightheaded episodes for the past couple of months.  He actually had an episode during today's visit and is spontaneously resolved after few minutes.  Denies any shortness of breath, chest pain, vision changes, focal deficits.  Patient has been referred to establish care with neurology for further evaluation.  INTERVAL HISTORY Edward Mejia is a 74 y.o. male who has above history reviewed by me today presents for follow up visit for management of polycythemia vera Patient has been on Eliquis 2.5 mg twice daily.  No bleeding events. Patient has psoriatic arthritis follows up with [facility] Dr. Abner Greenspan. Lumbar degenerative disease follows up with neurosurgery.  There is plan for possible steroid injections for interventional therapies.  Review of Systems  Constitutional: Negative for appetite change, chills, fatigue, fever and unexpected weight change.  HENT:   Negative for hearing loss and voice change.   Eyes: Negative for eye problems and icterus.  Respiratory: Negative for chest tightness, cough and shortness of breath.   Cardiovascular: Negative for chest pain and leg swelling.  Gastrointestinal: Negative for abdominal distention, abdominal pain and blood in stool.  Endocrine: Negative for hot flashes.  Genitourinary: Negative for difficulty urinating, dysuria and frequency.   Musculoskeletal: Negative for arthralgias.  Skin: Negative for itching and rash.  Neurological: Negative for extremity weakness, light-headedness  and numbness.  Hematological: Negative for  adenopathy. Does not bruise/bleed easily.  Psychiatric/Behavioral: Negative for confusion.    MEDICAL HISTORY:  Past Medical History:  Diagnosis Date  . Arthritis   . Bilateral pulmonary embolism ([place]) 03/2019   in setting of Covid-19 pneumonia  . Cancer ([place])    skin cancer  . Cellulitis   . Cellulitis   . DVT (deep venous thrombosis) ([place]) 03/2019   in setting of Covid-19 pneumonia  . Erythrocytosis 09/20/2019  . H/O adenomatous polyp of colon 03/24/2015  . History of brain disorder: history of amaurosis fugax 03/24/2015  . History of pneumonia 04/04/2008  . History of rheumatic fever 03/24/2015   DID Have Rheumatic Fever.    . Hypertension   . Peptic ulcer disease   . Sepsis ([place]) 04/12/2019   secondary to Covid pneumonia    SURGICAL HISTORY: Past Surgical History:  Procedure Laterality Date  . APPENDECTOMY    . LEFT HEART CATH AND CORONARY ANGIOGRAPHY Left 02/06/2018   Procedure: LEFT HEART CATH AND CORONARY ANGIOGRAPHY;  Surgeon: Teodoro Spray, MD;  Location: [place] [facility];  Service: Cardiovascular;  Laterality: Left;  Marland Kitchen MANDIBLE FRACTURE SURGERY     fractured jaw  . PARTIAL GASTRECTOMY     peptic ulcer disease    SOCIAL HISTORY: Social History   Socioeconomic History  . Marital status: Married    Spouse name: Lovey Newcomer   . Number of children: 4  . Years of education: Not on file  . Highest education level: 8th grade  Occupational History  . Occupation: Disabled    Comment: due to Psoriatic Arthritis  Tobacco Use  . Smoking status: Former Smoker    Quit date: 09/20/1982    Years since quitting: 37.3  . Smokeless tobacco: Never Used  . Tobacco comment: quit over 40+ years ago  Vaping Use  . Vaping Use: Never used  Substance and Sexual Activity  . Alcohol use: No    Alcohol/week: 0.0 standard drinks  . Drug use: No  . Sexual activity: Not on file  Other Topics Concern  . Not on file  Social History  Narrative  . Not on file   Social Determinants of Health   Financial Resource Strain: Not on file  Food Insecurity: Not on file  Transportation Needs: Not on file  Physical Activity: Not on file  Stress: Not on file  Social Connections: Not on file  Intimate Partner Violence: Not on file    FAMILY HISTORY: Family History  Problem Relation Age of Onset  . Cancer Mother        lung cancer  . Heart disease Father 28       heart attack  . Cancer Brother        pancreatic cancer  . Cancer Brother        pancreatic cancer    ALLERGIES:  is allergic to pravachol [pravastatin].  MEDICATIONS:  Current Outpatient Medications  Medication Sig Dispense Refill  . acetaminophen (TYLENOL) 500 MG tablet Take 1,000 mg by mouth 3 (three) times daily as needed for mild pain or headache.     . albuterol (VENTOLIN HFA) 108 (90 Base) MCG/ACT inhaler Inhale 2 puffs into the lungs every 6 (six) hours as needed for wheezing or shortness of breath.    Marland Kitchen apixaban (ELIQUIS) 2.5 MG TABS tablet Take 1 tablet (2.5 mg total) by mouth 2 (two) times daily. 60 tablet 3  . aspirin EC 81 MG tablet Take 81 mg by mouth daily. Swallow whole.    Marland Kitchen  etanercept (ENBREL) 50 MG/ML injection Inject 50 mg into the skin every Thursday.     . gabapentin (NEURONTIN) 300 MG capsule Take 900 mg by mouth See admin instructions.     Marland Kitchen HYDROcodone-acetaminophen (NORCO) 7.5-325 MG tablet Take 1 tablet by mouth 3 (three) times daily as needed for moderate pain. (Patient taking differently: Take 1 tablet by mouth every 6 (six) hours.) 90 tablet 0  . hydrocortisone 2.5 % cream Apply topically 2 (two) times daily. As needed (Patient taking differently: Apply topically daily. As needed) 30 g 3  . hydroxyurea (HYDREA) 500 MG capsule Take $RemoveBefo'1000mg'tzyPuDtpebR$  ( 2 tablets ) on Mondays, Wednesdays, Fridays and Sundays. Take 500 mg (1 tablet) other days). May take with food to minimize GI side effects. 180 capsule 0  . isosorbide mononitrate (IMDUR) 120 MG  24 hr tablet Take 120 mg by mouth daily.     . predniSONE (DELTASONE) 5 MG tablet Take 7.5 mg by mouth every morning.    . ranolazine (RANEXA) 500 MG 12 hr tablet Take 500 mg by mouth in the morning and at bedtime.    . valsartan-hydrochlorothiazide (DIOVAN-HCT) 160-12.5 MG tablet Take 1 tablet by mouth daily. 90 tablet 3  . nitroGLYCERIN (NITROSTAT) 0.4 MG SL tablet Place 0.4 mg under the tongue every 5 (five) minutes as needed for chest pain.  (Patient not taking: No sig reported)     No current facility-administered medications for this visit.     PHYSICAL EXAMINATION: ECOG PERFORMANCE STATUS: 1 - Symptomatic but completely ambulatory Today's Vitals   01/24/20 0947  BP: (!) 167/81  Pulse: 66  Resp: 16  Temp: (!) 97.3 F (36.3 C)  TempSrc: Tympanic  SpO2: 99%  Weight: 226 lb 4.8 oz (102.6 kg)  PainSc: 5    Body mass index is 29.06 kg/m.  Marland Kitchen Physical Exam Constitutional:      General: He is not in acute distress. HENT:     Head: Normocephalic and atraumatic.  Eyes:     General: No scleral icterus. Cardiovascular:     Rate and Rhythm: Normal rate and regular rhythm.     Heart sounds: Normal heart sounds.  Pulmonary:     Effort: Pulmonary effort is normal. No respiratory distress.     Breath sounds: No wheezing.  Abdominal:     General: Bowel sounds are normal. There is no distension.     Palpations: Abdomen is soft.  Musculoskeletal:        General: No deformity. Normal range of motion.     Cervical back: Normal range of motion and neck supple.  Skin:    General: Skin is warm and dry.     Findings: No erythema or rash.  Neurological:     Mental Status: He is alert and oriented to person, place, and time. Mental status is at baseline.     Cranial Nerves: No cranial nerve deficit.     Coordination: Coordination normal.  Psychiatric:        Mood and Affect: Mood normal.     LABORATORY DATA:  I have reviewed the data as listed Lab Results  Component Value Date    WBC 10.2 01/24/2020   HGB 16.6 01/24/2020   HCT 52.1 (H) 01/24/2020   MCV 85.3 01/24/2020   PLT 215 01/24/2020   Recent Labs    04/12/19 1045 04/13/19 0418 04/16/19 0243 04/26/19 1545 09/16/19 1058 09/17/19 1049 11/12/19 1315 11/29/19 1252 12/16/19 1404 12/27/19 0922 01/24/20 0935  NA 131*   < >  134* 133*  --  133*   < > 133*  --  132* 132*  K 5.1   < > 4.3 4.8  --  4.6   < > 3.7  --  4.3 4.5  CL 101   < > 95* 95*  --  93*   < > 97*  --  96* 94*  CO2 16*   < > 26 23  --  23   < > 29  --  29 27  GLUCOSE 102*   < > 75 125*  --  61*   < > 114*  --  106* 89  BUN 17   < > 18 10  --  12   < > 13  --  12 12  CREATININE 0.91   < > 1.05 1.17   < > 1.23   < > 1.15  --  1.25* 1.11  CALCIUM 8.0*   < > 7.9* 8.4*  --  9.1   < > 8.2*  --  8.6* 8.7*  GFRNONAA >60   < > >60 61  --  57*   < > >60  --  >60 >60  GFRAA >60   < > >60 71  --  66  --   --   --   --   --   PROT 5.9*   < > 5.0* 5.7*  --  6.2   < > 5.9* 6.4* 5.7* 6.4*  ALBUMIN 2.6*   < > 2.4* 3.5*  --  4.2   < > 3.8 4.0 3.7 4.1  AST 29   < > 26 22  --  14   < > $R'17 16 15 'BH$ 14*  ALT 23   < > 32 17  --  13   < > $R'15 16 15 14  'Cu$ ALKPHOS 49   < > 47 79  --  51   < > 38 35* 35* 32*  BILITOT 2.6*   < > 1.5* 1.3*  --  1.0   < > 1.1 1.5* 1.1 1.4*  BILIDIR 0.4*  --   --   --   --   --   --   --  0.3*  --   --   IBILI 2.2*  --   --   --   --   --   --   --  1.2*  --   --    < > = values in this interval not displayed.   Iron/TIBC/Ferritin/ %Sat    Component Value Date/Time   IRON 49 09/20/2019 1201   TIBC 318 09/20/2019 1201   FERRITIN 24 09/20/2019 1201   IRONPCTSAT 15 (L) 09/20/2019 1201      RADIOGRAPHIC STUDIES: I have personally reviewed the radiological images as listed and agreed with the findings in the report. No results found.    ASSESSMENT & PLAN:  1. JAK2 V617F mutation   2. Polycythemia vera ([place])   3. History of pulmonary embolism   4. Monoclonal B-cell lymphocytosis of undetermined significance   5. Post-thrombotic  syndrome    #Polycythemia vera,  JAK2 V617F mutation. Labs reviewed and discussed with patient. Hematocrit is 52 Hemoglobin 16.6 Patient will proceed with phlebotomy-500 cc today. Repeat CBC weekly +/- phlebotomy. Goal of hematocrit 45 I recommend patient to increase hydroxyurea to 1000 mg daily. Possible further increase to 1500 mg daily  Continue aspirin 81 mg daily.  For history of thrombosis, continue Eliquis 2.5 mg BID. For future  interventional procedure, okay to stop Eliquis 2 days prior to the procedure and resume after procedure.  # post thrombotic syndrome continue leg elevation and compression stocking  # monoclonal lymphocytosis of unknown significance.  By flow cytometry and bone marrow biopsy Observation. . Orders Placed This Encounter  Procedures  . CBC with Differential/Platelet    Standing Status:   Future    Standing Expiration Date:   01/23/2021  . CBC with Differential/Platelet    Standing Status:   Future    Standing Expiration Date:   01/23/2021  . CBC with Differential/Platelet    Standing Status:   Future    Standing Expiration Date:   01/23/2021  . Comprehensive metabolic panel    Standing Status:   Future    Standing Expiration Date:   01/23/2021    All questions were answered. The patient knows to call the clinic with any problems questions or concerns.  cc Birdie Sons, MD  Follow-up 4 weeks Earlie Server, MD, PhD Hematology Oncology [facility] Pager- [phone] 01/24/2020

## 2020-01-24 NOTE — Progress Notes (Signed)
Patient here for follow up. No new concerns voiced.  °

## 2020-01-24 NOTE — Progress Notes (Signed)
Patient tolerated phlebotomy infusion well in [facility] today, no concerns voiced. 500 cc removed as verbal order by Dr. Tasia Catchings. Blood was thick, slow flowing. Patient discharged with wife. Stable.

## 2020-01-28 DIAGNOSIS — M4312 Spondylolisthesis, cervical region: Secondary | ICD-10-CM | POA: Diagnosis not present

## 2020-01-28 DIAGNOSIS — M50023 Cervical disc disorder at C6-C7 level with myelopathy: Secondary | ICD-10-CM | POA: Diagnosis not present

## 2020-01-28 DIAGNOSIS — M4802 Spinal stenosis, cervical region: Secondary | ICD-10-CM | POA: Diagnosis not present

## 2020-01-30 ENCOUNTER — Inpatient Hospital Stay: Payer: Medicare Other

## 2020-01-30 ENCOUNTER — Other Ambulatory Visit: Payer: Self-pay

## 2020-01-30 ENCOUNTER — Other Ambulatory Visit: Payer: Self-pay | Admitting: Oncology

## 2020-01-30 VITALS — BP 144/80 | HR 74 | Temp 97.1°F | Resp 19

## 2020-01-30 DIAGNOSIS — Z1589 Genetic susceptibility to other disease: Secondary | ICD-10-CM

## 2020-01-30 DIAGNOSIS — D751 Secondary polycythemia: Secondary | ICD-10-CM

## 2020-01-30 DIAGNOSIS — D45 Polycythemia vera: Secondary | ICD-10-CM | POA: Diagnosis not present

## 2020-01-30 LAB — CBC WITH DIFFERENTIAL/PLATELET
Abs Immature Granulocytes: 0.12 10*3/uL — ABNORMAL HIGH (ref 0.00–0.07)
Basophils Absolute: 0.1 10*3/uL (ref 0.0–0.1)
Basophils Relative: 1 %
Eosinophils Absolute: 0.1 10*3/uL (ref 0.0–0.5)
Eosinophils Relative: 1 %
HCT: 48.6 % (ref 39.0–52.0)
Hemoglobin: 15.7 g/dL (ref 13.0–17.0)
Immature Granulocytes: 1 %
Lymphocytes Relative: 31 %
Lymphs Abs: 3.1 10*3/uL (ref 0.7–4.0)
MCH: 27.9 pg (ref 26.0–34.0)
MCHC: 32.3 g/dL (ref 30.0–36.0)
MCV: 86.3 fL (ref 80.0–100.0)
Monocytes Absolute: 0.9 10*3/uL (ref 0.1–1.0)
Monocytes Relative: 9 %
Neutro Abs: 5.8 10*3/uL (ref 1.7–7.7)
Neutrophils Relative %: 57 %
Platelets: 266 10*3/uL (ref 150–400)
RBC: 5.63 MIL/uL (ref 4.22–5.81)
RDW: 21.6 % — ABNORMAL HIGH (ref 11.5–15.5)
WBC: 10 10*3/uL (ref 4.0–10.5)
nRBC: 0 % (ref 0.0–0.2)

## 2020-01-30 MED ORDER — HYDROXYUREA 500 MG PO CAPS: ORAL_CAPSULE | ORAL | 3 refills | Status: DC

## 2020-01-30 NOTE — Patient Instructions (Signed)

## 2020-01-30 NOTE — Progress Notes (Signed)
Per Dr Tasia Catchings repeated 500 ml phlebotomy today. Pt tolerated procedure well. No complaints of dizziness or weakness at completion. Ambulatory.

## 2020-02-05 ENCOUNTER — Telehealth: Payer: Self-pay | Admitting: *Deleted

## 2020-02-05 NOTE — Telephone Encounter (Signed)
Per Herbert Seta: Patient is on [facility] schedule for lab/possible phlebotomy on 02/06/20. Ricki Rodriguez would like for [place] to find out if this can be r/s to next Tuesday 1/4  Appt were changed as requested..  I was unable to he him or his wife "Dois Davenport" by phone to make them aware. A detailed message was left on his VM making him aware.

## 2020-02-06 ENCOUNTER — Inpatient Hospital Stay: Payer: Medicare Other

## 2020-02-11 ENCOUNTER — Other Ambulatory Visit: Payer: Self-pay

## 2020-02-11 ENCOUNTER — Inpatient Hospital Stay: Payer: Medicare Other | Attending: Oncology

## 2020-02-11 ENCOUNTER — Inpatient Hospital Stay: Payer: Medicare Other

## 2020-02-11 VITALS — BP 134/88 | HR 75 | Temp 97.7°F | Resp 18

## 2020-02-11 DIAGNOSIS — D45 Polycythemia vera: Secondary | ICD-10-CM | POA: Insufficient documentation

## 2020-02-11 DIAGNOSIS — D751 Secondary polycythemia: Secondary | ICD-10-CM

## 2020-02-11 DIAGNOSIS — Z1589 Genetic susceptibility to other disease: Secondary | ICD-10-CM

## 2020-02-11 LAB — CBC WITH DIFFERENTIAL/PLATELET
Abs Immature Granulocytes: 0.08 10*3/uL — ABNORMAL HIGH (ref 0.00–0.07)
Basophils Absolute: 0.1 10*3/uL (ref 0.0–0.1)
Basophils Relative: 1 %
Eosinophils Absolute: 0 10*3/uL (ref 0.0–0.5)
Eosinophils Relative: 1 %
HCT: 45.9 % (ref 39.0–52.0)
Hemoglobin: 15 g/dL (ref 13.0–17.0)
Immature Granulocytes: 1 %
Lymphocytes Relative: 27 %
Lymphs Abs: 2.2 10*3/uL (ref 0.7–4.0)
MCH: 28.6 pg (ref 26.0–34.0)
MCHC: 32.7 g/dL (ref 30.0–36.0)
MCV: 87.6 fL (ref 80.0–100.0)
Monocytes Absolute: 0.4 10*3/uL (ref 0.1–1.0)
Monocytes Relative: 5 %
Neutro Abs: 5.3 10*3/uL (ref 1.7–7.7)
Neutrophils Relative %: 65 %
Platelets: 126 10*3/uL — ABNORMAL LOW (ref 150–400)
RBC: 5.24 MIL/uL (ref 4.22–5.81)
RDW: 21.1 % — ABNORMAL HIGH (ref 11.5–15.5)
WBC: 8 10*3/uL (ref 4.0–10.5)
nRBC: 0 % (ref 0.0–0.2)

## 2020-02-11 NOTE — Progress Notes (Signed)
Tolerated 500 ml phlebotomy today. No discomfort at time of discharge. Ambulatory.

## 2020-02-11 NOTE — Patient Instructions (Signed)

## 2020-02-12 DIAGNOSIS — M5127 Other intervertebral disc displacement, lumbosacral region: Secondary | ICD-10-CM | POA: Diagnosis not present

## 2020-02-12 DIAGNOSIS — M5126 Other intervertebral disc displacement, lumbar region: Secondary | ICD-10-CM | POA: Diagnosis not present

## 2020-02-12 DIAGNOSIS — M48061 Spinal stenosis, lumbar region without neurogenic claudication: Secondary | ICD-10-CM | POA: Diagnosis not present

## 2020-02-12 DIAGNOSIS — M47816 Spondylosis without myelopathy or radiculopathy, lumbar region: Secondary | ICD-10-CM | POA: Diagnosis not present

## 2020-02-21 ENCOUNTER — Inpatient Hospital Stay: Payer: Medicare Other

## 2020-02-21 ENCOUNTER — Other Ambulatory Visit: Payer: Self-pay

## 2020-02-21 ENCOUNTER — Inpatient Hospital Stay: Payer: Medicare Other | Admitting: Oncology

## 2020-02-21 ENCOUNTER — Encounter: Payer: Self-pay | Admitting: Oncology

## 2020-02-21 VITALS — BP 113/71 | HR 67 | Temp 98.0°F | Wt 226.9 lb

## 2020-02-21 DIAGNOSIS — Z1589 Genetic susceptibility to other disease: Secondary | ICD-10-CM

## 2020-02-21 DIAGNOSIS — D72820 Lymphocytosis (symptomatic): Secondary | ICD-10-CM

## 2020-02-21 DIAGNOSIS — E871 Hypo-osmolality and hyponatremia: Secondary | ICD-10-CM

## 2020-02-21 DIAGNOSIS — Z86711 Personal history of pulmonary embolism: Secondary | ICD-10-CM | POA: Diagnosis not present

## 2020-02-21 DIAGNOSIS — D45 Polycythemia vera: Secondary | ICD-10-CM

## 2020-02-21 LAB — CBC WITH DIFFERENTIAL/PLATELET
Abs Immature Granulocytes: 0.11 10*3/uL — ABNORMAL HIGH (ref 0.00–0.07)
Basophils Absolute: 0.1 10*3/uL (ref 0.0–0.1)
Basophils Relative: 1 %
Eosinophils Absolute: 0.1 10*3/uL (ref 0.0–0.5)
Eosinophils Relative: 1 %
HCT: 41.3 % (ref 39.0–52.0)
Hemoglobin: 13.5 g/dL (ref 13.0–17.0)
Immature Granulocytes: 1 %
Lymphocytes Relative: 34 %
Lymphs Abs: 2.6 10*3/uL (ref 0.7–4.0)
MCH: 29.5 pg (ref 26.0–34.0)
MCHC: 32.7 g/dL (ref 30.0–36.0)
MCV: 90.4 fL (ref 80.0–100.0)
Monocytes Absolute: 0.5 10*3/uL (ref 0.1–1.0)
Monocytes Relative: 7 %
Neutro Abs: 4.3 10*3/uL (ref 1.7–7.7)
Neutrophils Relative %: 56 %
Platelets: 103 10*3/uL — ABNORMAL LOW (ref 150–400)
RBC: 4.57 MIL/uL (ref 4.22–5.81)
RDW: 21.2 % — ABNORMAL HIGH (ref 11.5–15.5)
WBC: 7.7 10*3/uL (ref 4.0–10.5)
nRBC: 0 % (ref 0.0–0.2)

## 2020-02-21 LAB — COMPREHENSIVE METABOLIC PANEL
ALT: 15 U/L (ref 0–44)
AST: 15 U/L (ref 15–41)
Albumin: 3.8 g/dL (ref 3.5–5.0)
Alkaline Phosphatase: 27 U/L — ABNORMAL LOW (ref 38–126)
Anion gap: 8 (ref 5–15)
BUN: 17 mg/dL (ref 8–23)
CO2: 27 mmol/L (ref 22–32)
Calcium: 8.3 mg/dL — ABNORMAL LOW (ref 8.9–10.3)
Chloride: 95 mmol/L — ABNORMAL LOW (ref 98–111)
Creatinine, Ser: 1.38 mg/dL — ABNORMAL HIGH (ref 0.61–1.24)
GFR, Estimated: 53 mL/min — ABNORMAL LOW (ref >60)
Glucose, Bld: 92 mg/dL (ref 70–99)
Potassium: 4 mmol/L (ref 3.5–5.1)
Sodium: 130 mmol/L — ABNORMAL LOW (ref 135–145)
Total Bilirubin: 0.9 mg/dL (ref 0.3–1.2)
Total Protein: 5.8 g/dL — ABNORMAL LOW (ref 6.5–8.1)

## 2020-02-21 NOTE — Progress Notes (Signed)
Hematology/Oncology follow up note [facility] [phone] [phone]   Patient Care Team: Birdie Sons, MD as PCP - General (Family Medicine) Lorelee Cover., MD as Consulting Physician (Ophthalmology) Quintin Alto, MD as Referring Physician (Rheumatology) Dorene Ar, MD (Pain Medicine) Ubaldo Glassing Javier Docker, MD as Consulting Physician (Cardiology) Earlie Server, MD as Consulting Physician (Oncology)  REFERRING PROVIDER: Birdie Sons, MD  CHIEF COMPLAINTS/REASON FOR VISIT:  Follow-up for erythrocytosis  HISTORY OF PRESENTING ILLNESS:   Edward Mejia is a  75 y.o.  male with PMH including rheumatoid arthritis, peripheral vascular disease hypertension, Covid infection history,, was seen in consultation at the request of  Fisher, Kirstie Peri, MD  for evaluation of polycytosis Patient was accompanied by her wife who participated in providing medical history. February 2021, patient was hospitalized due to COVID-19 pneumonia, respiratory failure, treated with steroids, remdesivir, received bamlanivimab.  Hospitalization was also completed by [facility], bleeding was  thought to be secondary to hemorrhoids.  He was discharged on 04/07/2019 and presented back to emergency room on 04/12/2019 due to left lower extremity swelling, acute hypoxic respiratory failure.  Work-up showed acute bilateral pulmonary embolism, large left lower extremity DVT, thrombosis and IVC and left external iliac vein.  Patient was started on anticoagulation and transition to Eliquis Patient has been on Eliquis since March 2021.  Patient also takes aspirin.  Patient has history of CAD, follows up with Dr. Ubaldo Glassing.  Rheumatoid arthritis on Enbrel once a week.  Also on prednisone 7.5 mg daily. Patient has also been found to have erythrocytosis. 09/20/2019, CBC showed hemoglobin of 17.6, reviewed previous medical records, erythrocytosis can be traced back to at least 2017.  Patient also has  leukocytosis since March 2021.  Leukocytosis has trended down recently and CBC on 09/20/2019 showed a white count of 10.8, predominantly monocytosis.  Patient is a former smoker, 10-12-pack-year smoking history and he stopped smoking in 1984. Currently he has some chronic shortness of breath with exertion.  He has lost weight during his previous hospitalization and has gained weight since discharge.  Sometimes he feels sweaty.  Patient also reports intermittent dizziness/lightheaded episodes for the past couple of months.  He actually had an episode during today's visit and is spontaneously resolved after few minutes.  Denies any shortness of breath, chest pain, vision changes, focal deficits.  Patient has been referred to establish care with neurology for further evaluation.  #  psoriatic arthritis follows up with [facility] Dr. Abner Greenspan.on chronic prednisone.   INTERVAL HISTORY Edward Mejia is a 75 y.o. male who has above history reviewed by me today presents for follow up visit for management of polycythemia vera Patient has been on Eliquis 2.5 mg twice daily.  No bleeding events. He takes hydroxyurea 1534m daily.He has developed some erythematous rash on bilateral forearm, shoulder and anterior chest wall. Rash is not itchy.  Mild fatigue.  Review of Systems  Constitutional: Negative for appetite change, chills, fatigue, fever and unexpected weight change.  HENT:   Negative for hearing loss and voice change.   Eyes: Negative for eye problems and icterus.  Respiratory: Negative for chest tightness, cough and shortness of breath.   Cardiovascular: Negative for chest pain and leg swelling.  Gastrointestinal: Negative for abdominal distention, abdominal pain and blood in stool.  Endocrine: Negative for hot flashes.  Genitourinary: Negative for difficulty urinating, dysuria and frequency.   Musculoskeletal: Negative for arthralgias.  Skin: Negative for itching and rash.  Neurological:  Negative for extremity weakness, light-headedness and numbness.  Hematological: Negative for adenopathy. Does not bruise/bleed easily.  Psychiatric/Behavioral: Negative for confusion.    MEDICAL HISTORY:  Past Medical History:  Diagnosis Date  . Arthritis   . Bilateral pulmonary embolism ([place]) 03/2019   in setting of Covid-19 pneumonia  . Cancer ([place])    skin cancer  . Cellulitis   . Cellulitis   . DVT (deep venous thrombosis) ([place]) 03/2019   in setting of Covid-19 pneumonia  . Erythrocytosis 09/20/2019  . H/O adenomatous polyp of colon 03/24/2015  . History of brain disorder: history of amaurosis fugax 03/24/2015  . History of pneumonia 04/04/2008  . History of rheumatic fever 03/24/2015   DID Have Rheumatic Fever.    . Hypertension   . Peptic ulcer disease   . Sepsis ([place]) 04/12/2019   secondary to Covid pneumonia    SURGICAL HISTORY: Past Surgical History:  Procedure Laterality Date  . APPENDECTOMY    . LEFT HEART CATH AND CORONARY ANGIOGRAPHY Left 02/06/2018   Procedure: LEFT HEART CATH AND CORONARY ANGIOGRAPHY;  Surgeon: Teodoro Spray, MD;  Location: [facility];  Service: Cardiovascular;  Laterality: Left;  Marland Kitchen MANDIBLE FRACTURE SURGERY     fractured jaw  . PARTIAL GASTRECTOMY     peptic ulcer disease    SOCIAL HISTORY: Social History   Socioeconomic History  . Marital status: Married    Spouse name: Lovey Newcomer   . Number of children: 4  . Years of education: Not on file  . Highest education level: 8th grade  Occupational History  . Occupation: Disabled    Comment: due to Psoriatic Arthritis  Tobacco Use  . Smoking status: Former Smoker    Quit date: 09/20/1982    Years since quitting: 37.4  . Smokeless tobacco: Never Used  . Tobacco comment: quit over 40+ years ago  Vaping Use  . Vaping Use: Never used  Substance and Sexual Activity  . Alcohol use: No    Alcohol/week: 0.0 standard drinks  . Drug use: No  . Sexual activity: Not on file  Other  Topics Concern  . Not on file  Social History Narrative  . Not on file   Social Determinants of Health   Financial Resource Strain: Not on file  Food Insecurity: Not on file  Transportation Needs: Not on file  Physical Activity: Not on file  Stress: Not on file  Social Connections: Not on file  Intimate Partner Violence: Not on file    FAMILY HISTORY: Family History  Problem Relation Age of Onset  . Cancer Mother        lung cancer  . Heart disease Father 65       heart attack  . Cancer Brother        pancreatic cancer  . Cancer Brother        pancreatic cancer    ALLERGIES:  is allergic to pravachol [pravastatin].  MEDICATIONS:  Current Outpatient Medications  Medication Sig Dispense Refill  . acetaminophen (TYLENOL) 500 MG tablet Take 1,000 mg by mouth 3 (three) times daily as needed for mild pain or headache.     Marland Kitchen apixaban (ELIQUIS) 2.5 MG TABS tablet Take 1 tablet (2.5 mg total) by mouth 2 (two) times daily. 60 tablet 3  . aspirin EC 81 MG tablet Take 81 mg by mouth daily. Swallow whole.    . etanercept (ENBREL) 50 MG/ML injection Inject 50 mg into the skin every Thursday.     Marland Kitchen  gabapentin (NEURONTIN) 300 MG capsule Take 900 mg by mouth See admin instructions.     Marland Kitchen HYDROcodone-acetaminophen (NORCO) 7.5-325 MG tablet Take 1 tablet by mouth 3 (three) times daily as needed for moderate pain. (Patient taking differently: Take 1 tablet by mouth every 6 (six) hours.) 90 tablet 0  . hydroxyurea (HYDREA) 500 MG capsule Take 152m (32 tablets ) daily 180 capsule 3  . isosorbide mononitrate (IMDUR) 120 MG 24 hr tablet Take 120 mg by mouth daily.     . predniSONE (DELTASONE) 5 MG tablet Take 7.5 mg by mouth every morning.    . ranolazine (RANEXA) 500 MG 12 hr tablet Take 500 mg by mouth in the morning and at bedtime.    . valsartan-hydrochlorothiazide (DIOVAN-HCT) 160-12.5 MG tablet Take 1 tablet by mouth daily. 90 tablet 3  . albuterol (VENTOLIN HFA) 108 (90 Base) MCG/ACT  inhaler Inhale 2 puffs into the lungs every 6 (six) hours as needed for wheezing or shortness of breath. (Patient not taking: Reported on 02/21/2020)    . hydrocortisone 2.5 % cream Apply topically 2 (two) times daily. As needed (Patient not taking: Reported on 02/21/2020) 30 g 3  . nitroGLYCERIN (NITROSTAT) 0.4 MG SL tablet Place 0.4 mg under the tongue every 5 (five) minutes as needed for chest pain.  (Patient not taking: No sig reported)     No current facility-administered medications for this visit.     PHYSICAL EXAMINATION: ECOG PERFORMANCE STATUS: 1 - Symptomatic but completely ambulatory Today's Vitals   02/21/20 1330  BP: 113/71  Pulse: 67  Temp: 98 F (36.7 C)  TempSrc: Oral  Weight: 226 lb 14.4 oz (102.9 kg)  PainSc: 4    Body mass index is 29.13 kg/m.  .Marland KitchenPhysical Exam Constitutional:      General: He is not in acute distress. HENT:     Head: Normocephalic and atraumatic.  Eyes:     General: No scleral icterus. Cardiovascular:     Rate and Rhythm: Normal rate and regular rhythm.     Heart sounds: Normal heart sounds.  Pulmonary:     Effort: Pulmonary effort is normal. No respiratory distress.     Breath sounds: No wheezing.  Abdominal:     General: Bowel sounds are normal. There is no distension.     Palpations: Abdomen is soft.  Musculoskeletal:        General: No deformity. Normal range of motion.     Cervical back: Normal range of motion and neck supple.  Skin:    General: Skin is warm and dry.     Findings: No erythema or rash.  Neurological:     Mental Status: He is alert and oriented to person, place, and time. Mental status is at baseline.     Cranial Nerves: No cranial nerve deficit.     Coordination: Coordination normal.  Psychiatric:        Mood and Affect: Mood normal.     LABORATORY DATA:  I have reviewed the data as listed Lab Results  Component Value Date   WBC 7.7 02/21/2020   HGB 13.5 02/21/2020   HCT 41.3 02/21/2020   MCV 90.4  02/21/2020   PLT 103 (L) 02/21/2020   Recent Labs    04/12/19 1045 04/13/19 0418 04/16/19 0243 04/26/19 1545 09/16/19 1058 09/17/19 1049 11/12/19 1315 12/16/19 1404 12/27/19 0922 01/24/20 0935 02/21/20 1255  NA 131*   < > 134* 133*  --  133*   < >  --  132* 132* 130*  K 5.1   < > 4.3 4.8  --  4.6   < >  --  4.3 4.5 4.0  CL 101   < > 95* 95*  --  93*   < >  --  96* 94* 95*  CO2 16*   < > 26 23  --  23   < >  --  _0 GLUCOSE 102*   < > 75 125*  --  61*   < >  --  106* 89 92  BUN 17   < > 18 10  --  12   < >  --  _1 CREATININE 0.91   < > 1.05 1.17   < > 1.23   < >  --  1.25* 1.11 1.38*  CALCIUM 8.0*   < > 7.9* 8.4*  --  9.1   < >  --  8.6* 8.7* 8.3*  GFRNONAA >60   < > >60 61  --  57*   < >  --  >60 >60 53*  GFRAA >60   < > >60 71  --  66  --   --   --   --   --   PROT 5.9*   < > 5.0* 5.7*  --  6.2   < > 6.4* 5.7* 6.4* 5.8*  ALBUMIN 2.6*   < > 2.4* 3.5*  --  4.2   < > 4.0 3.7 4.1 3.8  AST 29   < > 26 22  --  14   < > 16 15 14* 15  ALT 23   < > 32 17  --  13   < > _2 ALKPHOS 49   < > 47 79  --  51   < > 35* 35* 32* 27*  BILITOT 2.6*   < > 1.5* 1.3*  --  1.0   < > 1.5* 1.1 1.4* 0.9  BILIDIR 0.4*  --   --   --   --   --   --  0.3*  --   --   --   IBILI 2.2*  --   --   --   --   --   --  1.2*  --   --   --    < > = values in this interval not displayed.   Iron/TIBC/Ferritin/ %Sat    Component Value Date/Time   IRON 49 09/20/2019 1201   TIBC 318 09/20/2019 1201   FERRITIN 24 09/20/2019 1201   IRONPCTSAT 15 (L) 09/20/2019 1201      RADIOGRAPHIC STUDIES: I have personally reviewed the radiological images as listed and agreed with the findings in the report. No results found.    ASSESSMENT & PLAN:  1. JAK2 V617F mutation   2. Polycythemia vera ([place])   3. History of pulmonary embolism   4. Monoclonal B-cell lymphocytosis of undetermined significance   5. Hyponatremia    #Polycythemia vera,  JAK2 V617F mutation. Labs are reviewed and discussed  with patient. No need for phlebotomy today,  Decrease Hydroxyurea to 1000 mg daily.  Continue Aspirin 91m  # History of VTE, continue Elqiuis 2.589mBID.  # monoclonal lymphocytosis of unknown significance.  By flow cytometry and bone marrow biopsy Observation.  # Hyponatremia, chronic since 2019. Monitor.  # Low serum total protein, recommend protein supplementation.   Orders Placed This Encounter  Procedures  . Hemoglobin and  Hematocrit, Blood    Standing Status:   Future    Standing Expiration Date:   02/20/2021  . CBC with Differential/Platelet    Standing Status:   Future    Standing Expiration Date:   02/20/2021  . Comprehensive metabolic panel    Standing Status:   Future    Standing Expiration Date:   02/20/2021    All questions were answered. The patient knows to call the clinic with any problems questions or concerns.  cc Birdie Sons, MD  Follow-up 4 weeks Earlie Server, MD, PhD Hematology Oncology [facility] at [facility] Pager- [phone] 02/21/2020

## 2020-03-05 DIAGNOSIS — M5030 Other cervical disc degeneration, unspecified cervical region: Secondary | ICD-10-CM | POA: Diagnosis not present

## 2020-03-05 DIAGNOSIS — M5136 Other intervertebral disc degeneration, lumbar region: Secondary | ICD-10-CM | POA: Diagnosis not present

## 2020-03-05 DIAGNOSIS — M5416 Radiculopathy, lumbar region: Secondary | ICD-10-CM | POA: Diagnosis not present

## 2020-03-05 DIAGNOSIS — M4807 Spinal stenosis, lumbosacral region: Secondary | ICD-10-CM | POA: Diagnosis not present

## 2020-03-06 ENCOUNTER — Other Ambulatory Visit: Payer: Self-pay

## 2020-03-06 ENCOUNTER — Inpatient Hospital Stay (HOSPITAL_BASED_OUTPATIENT_CLINIC_OR_DEPARTMENT_OTHER): Payer: Medicare Other

## 2020-03-06 DIAGNOSIS — D45 Polycythemia vera: Secondary | ICD-10-CM

## 2020-03-06 LAB — HEMOGLOBIN AND HEMATOCRIT, BLOOD
HCT: 41.4 % (ref 39.0–52.0)
Hemoglobin: 13.8 g/dL (ref 13.0–17.0)

## 2020-03-20 ENCOUNTER — Inpatient Hospital Stay: Payer: Medicare Other | Admitting: Oncology

## 2020-03-20 ENCOUNTER — Encounter: Payer: Self-pay | Admitting: Oncology

## 2020-03-20 ENCOUNTER — Inpatient Hospital Stay: Payer: Medicare Other

## 2020-03-20 ENCOUNTER — Inpatient Hospital Stay: Payer: Medicare Other | Attending: Oncology

## 2020-03-20 VITALS — BP 133/75 | HR 71 | Temp 98.5°F | Resp 16 | Wt 231.0 lb

## 2020-03-20 DIAGNOSIS — I10 Essential (primary) hypertension: Secondary | ICD-10-CM | POA: Diagnosis not present

## 2020-03-20 DIAGNOSIS — E871 Hypo-osmolality and hyponatremia: Secondary | ICD-10-CM | POA: Insufficient documentation

## 2020-03-20 DIAGNOSIS — Z86711 Personal history of pulmonary embolism: Secondary | ICD-10-CM

## 2020-03-20 DIAGNOSIS — D72820 Lymphocytosis (symptomatic): Secondary | ICD-10-CM | POA: Diagnosis not present

## 2020-03-20 DIAGNOSIS — Z86718 Personal history of other venous thrombosis and embolism: Secondary | ICD-10-CM | POA: Insufficient documentation

## 2020-03-20 DIAGNOSIS — D45 Polycythemia vera: Secondary | ICD-10-CM | POA: Diagnosis not present

## 2020-03-20 DIAGNOSIS — M069 Rheumatoid arthritis, unspecified: Secondary | ICD-10-CM | POA: Diagnosis not present

## 2020-03-20 DIAGNOSIS — Z1589 Genetic susceptibility to other disease: Secondary | ICD-10-CM

## 2020-03-20 DIAGNOSIS — I739 Peripheral vascular disease, unspecified: Secondary | ICD-10-CM | POA: Insufficient documentation

## 2020-03-20 DIAGNOSIS — Z7901 Long term (current) use of anticoagulants: Secondary | ICD-10-CM | POA: Diagnosis not present

## 2020-03-20 DIAGNOSIS — D696 Thrombocytopenia, unspecified: Secondary | ICD-10-CM | POA: Insufficient documentation

## 2020-03-20 DIAGNOSIS — Z8616 Personal history of COVID-19: Secondary | ICD-10-CM | POA: Diagnosis not present

## 2020-03-20 LAB — COMPREHENSIVE METABOLIC PANEL
ALT: 22 U/L (ref 0–44)
AST: 18 U/L (ref 15–41)
Albumin: 3.8 g/dL (ref 3.5–5.0)
Alkaline Phosphatase: 31 U/L — ABNORMAL LOW (ref 38–126)
Anion gap: 8 (ref 5–15)
BUN: 12 mg/dL (ref 8–23)
CO2: 27 mmol/L (ref 22–32)
Calcium: 8.6 mg/dL — ABNORMAL LOW (ref 8.9–10.3)
Chloride: 94 mmol/L — ABNORMAL LOW (ref 98–111)
Creatinine, Ser: 1.12 mg/dL (ref 0.61–1.24)
GFR, Estimated: >60 mL/min (ref >60)
Glucose, Bld: 106 mg/dL — ABNORMAL HIGH (ref 70–99)
Potassium: 4.6 mmol/L (ref 3.5–5.1)
Sodium: 129 mmol/L — ABNORMAL LOW (ref 135–145)
Total Bilirubin: 1.2 mg/dL (ref 0.3–1.2)
Total Protein: 6 g/dL — ABNORMAL LOW (ref 6.5–8.1)

## 2020-03-20 LAB — CBC WITH DIFFERENTIAL/PLATELET
Abs Immature Granulocytes: 0.09 10*3/uL — ABNORMAL HIGH (ref 0.00–0.07)
Basophils Absolute: 0.1 10*3/uL (ref 0.0–0.1)
Basophils Relative: 1 %
Eosinophils Absolute: 0.1 10*3/uL (ref 0.0–0.5)
Eosinophils Relative: 1 %
HCT: 41 % (ref 39.0–52.0)
Hemoglobin: 13.7 g/dL (ref 13.0–17.0)
Immature Granulocytes: 1 %
Lymphocytes Relative: 31 %
Lymphs Abs: 3 10*3/uL (ref 0.7–4.0)
MCH: 31.9 pg (ref 26.0–34.0)
MCHC: 33.4 g/dL (ref 30.0–36.0)
MCV: 95.3 fL (ref 80.0–100.0)
Monocytes Absolute: 0.5 10*3/uL (ref 0.1–1.0)
Monocytes Relative: 5 %
Neutro Abs: 5.8 10*3/uL (ref 1.7–7.7)
Neutrophils Relative %: 61 %
Platelets: 117 10*3/uL — ABNORMAL LOW (ref 150–400)
RBC: 4.3 MIL/uL (ref 4.22–5.81)
RDW: 18.8 % — ABNORMAL HIGH (ref 11.5–15.5)
WBC: 9.5 10*3/uL (ref 4.0–10.5)
nRBC: 0 % (ref 0.0–0.2)

## 2020-03-20 NOTE — Addendum Note (Signed)
Addended by: Earlie Server on: 03/20/2020 08:59 PM   Modules accepted: Orders

## 2020-03-20 NOTE — Progress Notes (Signed)
Hematology/Oncology follow up note [facility] Telephone:([phone] Fax:([phone]) [phone]   Patient Care Team: Edward Mejia as PCP - General (Family Medicine) Edward Cover., Mejia as Consulting Physician (Ophthalmology) Edward Mejia as Referring Physician (Rheumatology) Edward Mejia (Pain Medicine) Edward Mejia Edward Mejia as Consulting Physician (Cardiology) Edward Mejia as Consulting Physician (Oncology)  REFERRING PROVIDER: Birdie Sons, Mejia  CHIEF COMPLAINTS/REASON FOR VISIT:  Follow-up for erythrocytosis  HISTORY OF PRESENTING ILLNESS:   Edward Mejia is a  75 y.o.  male with PMH including rheumatoid arthritis, peripheral vascular disease hypertension, Covid infection history,, was seen in consultation at the request of  Edward Mejia  for evaluation of polycytosis Patient was accompanied by her wife who participated in providing medical history. February 2021, patient was hospitalized due to COVID-19 pneumonia, respiratory failure, treated with steroids, remdesivir, received bamlanivimab.  Hospitalization was also completed by [facility], bleeding was  thought to be secondary to hemorrhoids.  He was discharged on 04/07/2019 and presented back to emergency room on 04/12/2019 due to left lower extremity swelling, acute hypoxic respiratory failure.  Work-up showed acute bilateral pulmonary embolism, large left lower extremity DVT, thrombosis and IVC and left external iliac vein.  Patient was started on anticoagulation and transition to Eliquis Patient has been on Eliquis since March 2021.  Patient also takes aspirin.  Patient has history of CAD, follows up with Dr. Ubaldo Mejia.  Rheumatoid arthritis on Enbrel once a week.  Also on prednisone 7.5 mg daily. Patient has also been found to have erythrocytosis. 09/20/2019, CBC showed hemoglobin of 17.6, reviewed previous medical records, erythrocytosis can be traced back to at least 2017.  Patient also has  leukocytosis since March 2021.  Leukocytosis has trended down recently and CBC on 09/20/2019 showed a white count of 10.8, predominantly monocytosis.  Patient is a former smoker, 10-12-pack-year smoking history and he stopped smoking in 1984. Currently he has some chronic shortness of breath with exertion.  He has lost weight during his previous hospitalization and has gained weight since discharge.  Sometimes he feels sweaty.  Patient also reports intermittent dizziness/lightheaded episodes for the past couple of months.  He actually had an episode during today's visit and is spontaneously resolved after few minutes.  Denies any shortness of breath, chest pain, vision changes, focal deficits.  Patient has been referred to establish care with neurology for further evaluation.  #  psoriatic arthritis follows up with [facility] Dr. Abner Mejia.on chronic prednisone.   INTERVAL HISTORY Edward Mejia is a 75 y.o. male who has above history reviewed by me today presents for follow up visit for management of polycythemia vera Patient has been on Eliquis 2.5 mg twice daily.  No bleeding events. He takes hydroxyurea 1000 mg tolerates well. No new complaints. Review of Systems  Constitutional: Negative for appetite change, chills, fatigue, fever and unexpected weight change.  HENT:   Negative for hearing loss and voice change.   Eyes: Negative for eye problems and icterus.  Respiratory: Negative for chest tightness, cough and shortness of breath.   Cardiovascular: Negative for chest pain and leg swelling.  Gastrointestinal: Negative for abdominal distention, abdominal pain and blood in stool.  Endocrine: Negative for hot flashes.  Genitourinary: Negative for difficulty urinating, dysuria and frequency.   Musculoskeletal: Negative for arthralgias.  Skin: Negative for itching and rash.  Neurological: Negative for extremity weakness, light-headedness and numbness.  Hematological: Negative for adenopathy.  Does not bruise/bleed easily.  Psychiatric/Behavioral: Negative for confusion.    MEDICAL HISTORY:  Past Medical History:  Diagnosis Date  . Arthritis   . Bilateral pulmonary embolism ([place]) 03/2019   in setting of Covid-19 pneumonia  . Cancer ([place])    skin cancer  . Cellulitis   . Cellulitis   . DVT (deep venous thrombosis) ([place]) 03/2019   in setting of Covid-19 pneumonia  . Erythrocytosis 09/20/2019  . H/O adenomatous polyp of colon 03/24/2015  . History of brain disorder: history of amaurosis fugax 03/24/2015  . History of pneumonia 04/04/2008  . History of rheumatic fever 03/24/2015   DID Have Rheumatic Fever.    . Hypertension   . Peptic ulcer disease   . Sepsis ([place]) 04/12/2019   secondary to Covid pneumonia    SURGICAL HISTORY: Past Surgical History:  Procedure Laterality Date  . APPENDECTOMY    . LEFT HEART CATH AND CORONARY ANGIOGRAPHY Left 02/06/2018   Procedure: LEFT HEART CATH AND CORONARY ANGIOGRAPHY;  Surgeon: Edward Mejia;  Location: [facility];  Service: Cardiovascular;  Laterality: Left;  Marland Kitchen MANDIBLE FRACTURE SURGERY     fractured jaw  . PARTIAL GASTRECTOMY     peptic ulcer disease    SOCIAL HISTORY: Social History   Socioeconomic History  . Marital status: Married    Spouse name: Edward Mejia   . Number of children: 4  . Years of education: Not on file  . Highest education level: 8th grade  Occupational History  . Occupation: Disabled    Comment: due to Psoriatic Arthritis  Tobacco Use  . Smoking status: Former Smoker    Quit date: 09/20/1982    Years since quitting: 37.5  . Smokeless tobacco: Never Used  . Tobacco comment: quit over 40+ years ago  Vaping Use  . Vaping Use: Never used  Substance and Sexual Activity  . Alcohol use: No    Alcohol/week: 0.0 standard drinks  . Drug use: No  . Sexual activity: Not on file  Other Topics Concern  . Not on file  Social History Narrative  . Not on file   Social Determinants of Health    Financial Resource Strain: Not on file  Food Insecurity: Not on file  Transportation Needs: Not on file  Physical Activity: Not on file  Stress: Not on file  Social Connections: Not on file  Intimate Partner Violence: Not on file    FAMILY HISTORY: Family History  Problem Relation Age of Onset  . Cancer Mother        lung cancer  . Heart disease Father 67       heart attack  . Cancer Brother        pancreatic cancer  . Cancer Brother        pancreatic cancer    ALLERGIES:  is allergic to pravachol [pravastatin].  MEDICATIONS:  Current Outpatient Medications  Medication Sig Dispense Refill  . acetaminophen (TYLENOL) 500 MG tablet Take 1,000 mg by mouth 3 (three) times daily as needed for mild pain or headache.     Marland Kitchen apixaban (ELIQUIS) 2.5 MG TABS tablet Take 1 tablet (2.5 mg total) by mouth 2 (two) times daily. 60 tablet 3  . aspirin EC 81 MG tablet Take 81 mg by mouth daily. Swallow whole.    . etanercept (ENBREL) 50 MG/ML injection Inject 50 mg into the skin every Thursday.     . gabapentin (NEURONTIN) 300 MG capsule Take 900 mg by mouth See admin instructions.     Marland Kitchen  HYDROcodone-acetaminophen (NORCO) 7.5-325 MG tablet Take 1 tablet by mouth 3 (three) times daily as needed for moderate pain. (Patient taking differently: Take 1 tablet by mouth every 6 (six) hours.) 90 tablet 0  . hydroxyurea (HYDREA) 500 MG capsule Take 1500mg  (32 tablets ) daily 180 capsule 3  . isosorbide mononitrate (IMDUR) 120 MG 24 hr tablet Take 120 mg by mouth daily.     . predniSONE (DELTASONE) 5 MG tablet Take 7.5 mg by mouth every morning.    . ranolazine (RANEXA) 500 MG 12 hr tablet Take 500 mg by mouth in the morning and at bedtime.    . valsartan-hydrochlorothiazide (DIOVAN-HCT) 160-12.5 MG tablet Take 1 tablet by mouth daily. 90 tablet 3  . albuterol (VENTOLIN HFA) 108 (90 Base) MCG/ACT inhaler Inhale 2 puffs into the lungs every 6 (six) hours as needed for wheezing or shortness of breath.  (Patient not taking: No sig reported)    . hydrocortisone 2.5 % cream Apply topically 2 (two) times daily. As needed (Patient not taking: No sig reported) 30 g 3  . nitroGLYCERIN (NITROSTAT) 0.4 MG SL tablet Place 0.4 mg under the tongue every 5 (five) minutes as needed for chest pain.  (Patient not taking: No sig reported)     No current facility-administered medications for this visit.     PHYSICAL EXAMINATION: ECOG PERFORMANCE STATUS: 1 - Symptomatic but completely ambulatory Today's Vitals   03/20/20 1356  BP: 133/75  Pulse: 71  Resp: 16  Temp: 98.5 F (36.9 C)  Weight: 231 lb (104.8 kg)  PainSc: 0-No pain   Body mass index is 29.66 kg/m.  Marland Kitchen Physical Exam Constitutional:      General: He is not in acute distress. HENT:     Head: Normocephalic and atraumatic.  Eyes:     General: No scleral icterus. Cardiovascular:     Rate and Rhythm: Normal rate and regular rhythm.     Heart sounds: Normal heart sounds.  Pulmonary:     Effort: Pulmonary effort is normal. No respiratory distress.     Breath sounds: No wheezing.  Abdominal:     General: Bowel sounds are normal. There is no distension.     Palpations: Abdomen is soft.  Musculoskeletal:        General: No deformity. Normal range of motion.     Cervical back: Normal range of motion and neck supple.  Skin:    General: Skin is warm and dry.     Findings: No erythema or rash.  Neurological:     Mental Status: He is alert and oriented to person, place, and time. Mental status is at baseline.     Cranial Nerves: No cranial nerve deficit.     Coordination: Coordination normal.  Psychiatric:        Mood and Affect: Mood normal.     LABORATORY DATA:  I have reviewed the data as listed Lab Results  Component Value Date   WBC 9.5 03/20/2020   HGB 13.7 03/20/2020   HCT 41.0 03/20/2020   MCV 95.3 03/20/2020   PLT 117 (L) 03/20/2020   Recent Labs    04/12/19 1045 04/13/19 0418 04/16/19 0243 04/16/19 0243  04/26/19 1545 09/16/19 1058 09/17/19 1049 11/12/19 1315 12/16/19 1404 12/27/19 0922 01/24/20 0935 02/21/20 1255 03/20/20 1321  NA 131*   < > 134*   < > 133*  --  133*   < >  --    < > 132* 130* 129*  K 5.1   < >  4.3  --  4.8  --  4.6   < >  --    < > 4.5 4.0 4.6  CL 101   < > 95*  --  95*  --  93*   < >  --    < > 94* 95* 94*  CO2 16*   < > 26  --  23  --  23   < >  --    < > 27 27 27   GLUCOSE 102*   < > 75   < > 125*  --  61*   < >  --    < > 89 92 106*  BUN 17   < > 18   < > 10  --  12   < >  --    < > 12 17 12   CREATININE 0.91   < > 1.05  --  1.17   < > 1.23   < >  --    < > 1.11 1.38* 1.12  CALCIUM 8.0*   < > 7.9*  --  8.4*  --  9.1   < >  --    < > 8.7* 8.3* 8.6*  GFRNONAA >60   < > >60  --  61  --  57*   < >  --    < > >60 53* >60  GFRAA >60   < > >60  --  71  --  66  --   --   --   --   --   --   PROT 5.9*   < > 5.0*   < > 5.7*  --  6.2   < > 6.4*   < > 6.4* 5.8* 6.0*  ALBUMIN 2.6*   < > 2.4*   < > 3.5*  --  4.2   < > 4.0   < > 4.1 3.8 3.8  AST 29   < > 26  --  22  --  14   < > 16   < > 14* 15 18  ALT 23   < > 32  --  17  --  13   < > 16   < > 14 15 22   ALKPHOS 49   < > 47  --  79  --  51   < > 35*   < > 32* 27* 31*  BILITOT 2.6*   < > 1.5*   < > 1.3*  --  1.0   < > 1.5*   < > 1.4* 0.9 1.2  BILIDIR 0.4*  --   --   --   --   --   --   --  0.3*  --   --   --   --   IBILI 2.2*  --   --   --   --   --   --   --  1.2*  --   --   --   --    < > = values in this interval not displayed.   Iron/TIBC/Ferritin/ %Sat    Component Value Date/Time   IRON 49 09/20/2019 1201   TIBC 318 09/20/2019 1201   FERRITIN 24 09/20/2019 1201   IRONPCTSAT 15 (L) 09/20/2019 1201      RADIOGRAPHIC STUDIES: I have personally reviewed the radiological images as listed and agreed with the findings in the report. No results found.    ASSESSMENT & PLAN:  1. Polycythemia vera ([place])  2. JAK2 V617F mutation   3. History of pulmonary embolism   4. Monoclonal B-cell lymphocytosis of undetermined  significance   5. Hyponatremia    #Polycythemia vera,  JAK2 V617F mutation. Labs are reviewed and discussed with patient. No need for phlebotomy. Continue Aspirin 81mg  Continue hydroxyurea 1000 mg 6 days/week and take 500 mg 1 day/week.  #Thrombocytopenia, stable. # History of VTE, continue Elqiuis 2.5mg  BID.  # monoclonal lymphocytosis of unknown significance.  observation.  # Hyponatremia, chronic since 2019. Monitor.  Possibly due to HCTZ  Orders Placed This Encounter  Procedures  . CBC with Differential/Platelet    Standing Status:   Future    Standing Expiration Date:   03/20/2021  . Comprehensive metabolic panel    Standing Status:   Future    Standing Expiration Date:   03/20/2021    All questions were answered. The patient knows to call the clinic with any problems questions or concerns.  cc Edward Mejia  Follow-up monthly labs with CBC.  Lab Mejia CBC CMP in 3 months  Edward Server, MD, PhD Hematology Oncology [facility] Pager- [phone] 03/20/2020

## 2020-03-20 NOTE — Progress Notes (Signed)
Patient denies new problems/concerns today.   °

## 2020-03-20 NOTE — Progress Notes (Signed)
HCT 41. No phlebotomy required today per parameters.

## 2020-04-17 ENCOUNTER — Inpatient Hospital Stay: Payer: Medicare Other | Attending: Oncology

## 2020-04-17 DIAGNOSIS — Z86711 Personal history of pulmonary embolism: Secondary | ICD-10-CM

## 2020-04-17 DIAGNOSIS — E871 Hypo-osmolality and hyponatremia: Secondary | ICD-10-CM

## 2020-04-17 DIAGNOSIS — D45 Polycythemia vera: Secondary | ICD-10-CM | POA: Diagnosis not present

## 2020-04-17 DIAGNOSIS — D72820 Lymphocytosis (symptomatic): Secondary | ICD-10-CM

## 2020-04-17 DIAGNOSIS — Z1589 Genetic susceptibility to other disease: Secondary | ICD-10-CM

## 2020-04-17 LAB — CBC WITH DIFFERENTIAL/PLATELET
Abs Immature Granulocytes: 0.13 10*3/uL — ABNORMAL HIGH (ref 0.00–0.07)
Basophils Absolute: 0.1 10*3/uL (ref 0.0–0.1)
Basophils Relative: 1 %
Eosinophils Absolute: 0.1 10*3/uL (ref 0.0–0.5)
Eosinophils Relative: 1 %
HCT: 43 % (ref 39.0–52.0)
Hemoglobin: 13.9 g/dL (ref 13.0–17.0)
Immature Granulocytes: 1 %
Lymphocytes Relative: 30 %
Lymphs Abs: 3 10*3/uL (ref 0.7–4.0)
MCH: 32.4 pg (ref 26.0–34.0)
MCHC: 32.3 g/dL (ref 30.0–36.0)
MCV: 100.2 fL — ABNORMAL HIGH (ref 80.0–100.0)
Monocytes Absolute: 0.9 10*3/uL (ref 0.1–1.0)
Monocytes Relative: 9 %
Neutro Abs: 5.8 10*3/uL (ref 1.7–7.7)
Neutrophils Relative %: 58 %
Platelets: 120 10*3/uL — ABNORMAL LOW (ref 150–400)
RBC: 4.29 MIL/uL (ref 4.22–5.81)
RDW: 15.9 % — ABNORMAL HIGH (ref 11.5–15.5)
WBC: 10 10*3/uL (ref 4.0–10.5)
nRBC: 0 % (ref 0.0–0.2)

## 2020-05-01 DIAGNOSIS — Z79899 Other long term (current) drug therapy: Secondary | ICD-10-CM | POA: Diagnosis not present

## 2020-05-01 DIAGNOSIS — L4050 Arthropathic psoriasis, unspecified: Secondary | ICD-10-CM | POA: Diagnosis not present

## 2020-05-01 DIAGNOSIS — L409 Psoriasis, unspecified: Secondary | ICD-10-CM | POA: Diagnosis not present

## 2020-05-15 ENCOUNTER — Inpatient Hospital Stay: Payer: Medicare Other | Attending: Oncology

## 2020-05-15 DIAGNOSIS — D72820 Lymphocytosis (symptomatic): Secondary | ICD-10-CM

## 2020-05-15 DIAGNOSIS — Z1589 Genetic susceptibility to other disease: Secondary | ICD-10-CM

## 2020-05-15 DIAGNOSIS — D45 Polycythemia vera: Secondary | ICD-10-CM | POA: Diagnosis not present

## 2020-05-15 DIAGNOSIS — E871 Hypo-osmolality and hyponatremia: Secondary | ICD-10-CM

## 2020-05-15 DIAGNOSIS — Z86711 Personal history of pulmonary embolism: Secondary | ICD-10-CM

## 2020-05-15 LAB — CBC WITH DIFFERENTIAL/PLATELET
Abs Immature Granulocytes: 0.2 10*3/uL — ABNORMAL HIGH (ref 0.00–0.07)
Basophils Absolute: 0.1 10*3/uL (ref 0.0–0.1)
Basophils Relative: 1 %
Eosinophils Absolute: 0.1 10*3/uL (ref 0.0–0.5)
Eosinophils Relative: 1 %
HCT: 44.1 % (ref 39.0–52.0)
Hemoglobin: 14.7 g/dL (ref 13.0–17.0)
Immature Granulocytes: 2 %
Lymphocytes Relative: 33 %
Lymphs Abs: 3.1 10*3/uL (ref 0.7–4.0)
MCH: 33.5 pg (ref 26.0–34.0)
MCHC: 33.3 g/dL (ref 30.0–36.0)
MCV: 100.5 fL — ABNORMAL HIGH (ref 80.0–100.0)
Monocytes Absolute: 0.7 10*3/uL (ref 0.1–1.0)
Monocytes Relative: 8 %
Neutro Abs: 5.2 10*3/uL (ref 1.7–7.7)
Neutrophils Relative %: 55 %
Platelets: 120 10*3/uL — ABNORMAL LOW (ref 150–400)
RBC: 4.39 MIL/uL (ref 4.22–5.81)
RDW: 14.6 % (ref 11.5–15.5)
WBC: 9.4 10*3/uL (ref 4.0–10.5)
nRBC: 0 % (ref 0.0–0.2)

## 2020-06-01 DIAGNOSIS — Z85828 Personal history of other malignant neoplasm of skin: Secondary | ICD-10-CM | POA: Diagnosis not present

## 2020-06-01 DIAGNOSIS — L814 Other melanin hyperpigmentation: Secondary | ICD-10-CM | POA: Diagnosis not present

## 2020-06-01 DIAGNOSIS — D044 Carcinoma in situ of skin of scalp and neck: Secondary | ICD-10-CM | POA: Diagnosis not present

## 2020-06-01 DIAGNOSIS — D225 Melanocytic nevi of trunk: Secondary | ICD-10-CM | POA: Diagnosis not present

## 2020-06-01 DIAGNOSIS — L821 Other seborrheic keratosis: Secondary | ICD-10-CM | POA: Diagnosis not present

## 2020-06-03 DIAGNOSIS — M47816 Spondylosis without myelopathy or radiculopathy, lumbar region: Secondary | ICD-10-CM | POA: Diagnosis not present

## 2020-06-03 DIAGNOSIS — M5136 Other intervertebral disc degeneration, lumbar region: Secondary | ICD-10-CM | POA: Diagnosis not present

## 2020-06-03 DIAGNOSIS — M4807 Spinal stenosis, lumbosacral region: Secondary | ICD-10-CM | POA: Diagnosis not present

## 2020-06-03 DIAGNOSIS — M48062 Spinal stenosis, lumbar region with neurogenic claudication: Secondary | ICD-10-CM | POA: Diagnosis not present

## 2020-06-04 ENCOUNTER — Other Ambulatory Visit: Payer: Self-pay | Admitting: Family Medicine

## 2020-06-04 DIAGNOSIS — I10 Essential (primary) hypertension: Secondary | ICD-10-CM

## 2020-06-04 NOTE — Telephone Encounter (Signed)
Courtesy refill. Patient needs an office visit for further refills. Requested Prescriptions  Pending Prescriptions Disp Refills  . valsartan-hydrochlorothiazide (DIOVAN-HCT) 160-12.5 MG tablet [Pharmacy Med Name: VALSARTAN/HCTZ 160MG /12.5MG  TABLETS] 30 tablet 0    Sig: TAKE 1 TABLET BY MOUTH DAILY     Cardiovascular: ARB + Diuretic Combos Failed - 06/04/2020  3:37 AM      Failed - Na in normal range and within 180 days    Sodium  Date Value Ref Range Status  03/20/2020 129 (L) 135 - 145 mmol/L Final  09/17/2019 133 (L) 134 - 144 mmol/L Final         Failed - Ca in normal range and within 180 days    Calcium  Date Value Ref Range Status  03/20/2020 8.6 (L) 8.9 - 10.3 mg/dL Final   Calcium, Ion  Date Value Ref Range Status  04/03/2019 1.10 (L) 1.15 - 1.40 mmol/L Final         Failed - Valid encounter within last 6 months    Recent Outpatient Visits          8 months ago Acute deep vein thrombosis (DVT) of distal vein of left lower extremity [facility])   [facility] Birdie Sons, MD   12 months ago Oral herpes   [facility] [place], Dionne Bucy, MD   1 year ago Acute deep vein thrombosis (DVT) of distal vein of left lower extremity [facility])   [facility] [place], Dionne Bucy, MD   1 year ago Pneumonia of right lung due to infectious organism, unspecified part of lung   [facility] Birdie Sons, MD   1 year ago Bilateral pulmonary embolism [facility])   [facility] Birdie Sons, MD             Passed - K in normal range and within 180 days    Potassium  Date Value Ref Range Status  03/20/2020 4.6 3.5 - 5.1 mmol/L Final         Passed - Cr in normal range and within 180 days    Creatinine, Ser  Date Value Ref Range Status  03/20/2020 1.12 0.61 - 1.24 mg/dL Final   Creatinine, Urine  Date Value Ref Range Status  04/14/2019 106.78 mg/dL Final    Comment:    Performed at [facility],  [place] [address] [place], [place] [address]         Passed - Patient is not pregnant      Passed - Last BP in normal range    BP Readings from Last 1 Encounters:  03/20/20 133/75

## 2020-06-16 DIAGNOSIS — Z85828 Personal history of other malignant neoplasm of skin: Secondary | ICD-10-CM | POA: Diagnosis not present

## 2020-06-16 DIAGNOSIS — D044 Carcinoma in situ of skin of scalp and neck: Secondary | ICD-10-CM | POA: Diagnosis not present

## 2020-06-16 DIAGNOSIS — L570 Actinic keratosis: Secondary | ICD-10-CM | POA: Diagnosis not present

## 2020-06-18 DIAGNOSIS — M5136 Other intervertebral disc degeneration, lumbar region: Secondary | ICD-10-CM | POA: Diagnosis not present

## 2020-06-18 DIAGNOSIS — M4807 Spinal stenosis, lumbosacral region: Secondary | ICD-10-CM | POA: Diagnosis not present

## 2020-06-18 DIAGNOSIS — Z79891 Long term (current) use of opiate analgesic: Secondary | ICD-10-CM | POA: Diagnosis not present

## 2020-06-18 DIAGNOSIS — M48062 Spinal stenosis, lumbar region with neurogenic claudication: Secondary | ICD-10-CM | POA: Diagnosis not present

## 2020-06-22 ENCOUNTER — Inpatient Hospital Stay: Payer: Medicare Other | Attending: Oncology

## 2020-06-22 ENCOUNTER — Other Ambulatory Visit: Payer: Self-pay | Admitting: *Deleted

## 2020-06-22 ENCOUNTER — Other Ambulatory Visit: Payer: Self-pay

## 2020-06-22 ENCOUNTER — Encounter: Payer: Self-pay | Admitting: Oncology

## 2020-06-22 ENCOUNTER — Inpatient Hospital Stay: Payer: Medicare Other

## 2020-06-22 ENCOUNTER — Inpatient Hospital Stay: Payer: Medicare Other | Admitting: Oncology

## 2020-06-22 VITALS — BP 128/76 | HR 79 | Temp 98.1°F | Resp 18 | Wt 225.7 lb

## 2020-06-22 DIAGNOSIS — D45 Polycythemia vera: Secondary | ICD-10-CM

## 2020-06-22 DIAGNOSIS — Z1589 Genetic susceptibility to other disease: Secondary | ICD-10-CM

## 2020-06-22 DIAGNOSIS — Z7982 Long term (current) use of aspirin: Secondary | ICD-10-CM | POA: Insufficient documentation

## 2020-06-22 DIAGNOSIS — Z86711 Personal history of pulmonary embolism: Secondary | ICD-10-CM | POA: Insufficient documentation

## 2020-06-22 DIAGNOSIS — D72820 Lymphocytosis (symptomatic): Secondary | ICD-10-CM | POA: Insufficient documentation

## 2020-06-22 LAB — CBC WITH DIFFERENTIAL/PLATELET
Abs Immature Granulocytes: 0.24 10*3/uL — ABNORMAL HIGH (ref 0.00–0.07)
Basophils Absolute: 0 10*3/uL (ref 0.0–0.1)
Basophils Relative: 0 %
Eosinophils Absolute: 0 10*3/uL (ref 0.0–0.5)
Eosinophils Relative: 0 %
HCT: 49.3 % (ref 39.0–52.0)
Hemoglobin: 16.1 g/dL (ref 13.0–17.0)
Immature Granulocytes: 2 %
Lymphocytes Relative: 19 %
Lymphs Abs: 2.3 10*3/uL (ref 0.7–4.0)
MCH: 33.5 pg (ref 26.0–34.0)
MCHC: 32.7 g/dL (ref 30.0–36.0)
MCV: 102.7 fL — ABNORMAL HIGH (ref 80.0–100.0)
Monocytes Absolute: 0.7 10*3/uL (ref 0.1–1.0)
Monocytes Relative: 6 %
Neutro Abs: 8.7 10*3/uL — ABNORMAL HIGH (ref 1.7–7.7)
Neutrophils Relative %: 73 %
Platelets: 238 10*3/uL (ref 150–400)
RBC: 4.8 MIL/uL (ref 4.22–5.81)
RDW: 15.4 % (ref 11.5–15.5)
WBC: 12 10*3/uL — ABNORMAL HIGH (ref 4.0–10.5)
nRBC: 0 % (ref 0.0–0.2)

## 2020-06-22 LAB — COMPREHENSIVE METABOLIC PANEL
ALT: 26 U/L (ref 0–44)
AST: 22 U/L (ref 15–41)
Albumin: 4 g/dL (ref 3.5–5.0)
Alkaline Phosphatase: 31 U/L — ABNORMAL LOW (ref 38–126)
Anion gap: 8 (ref 5–15)
BUN: 17 mg/dL (ref 8–23)
CO2: 26 mmol/L (ref 22–32)
Calcium: 8.4 mg/dL — ABNORMAL LOW (ref 8.9–10.3)
Chloride: 95 mmol/L — ABNORMAL LOW (ref 98–111)
Creatinine, Ser: 1.19 mg/dL (ref 0.61–1.24)
GFR, Estimated: >60 mL/min (ref >60)
Glucose, Bld: 177 mg/dL — ABNORMAL HIGH (ref 70–99)
Potassium: 4 mmol/L (ref 3.5–5.1)
Sodium: 129 mmol/L — ABNORMAL LOW (ref 135–145)
Total Bilirubin: 1.6 mg/dL — ABNORMAL HIGH (ref 0.3–1.2)
Total Protein: 6.4 g/dL — ABNORMAL LOW (ref 6.5–8.1)

## 2020-06-22 LAB — BILIRUBIN, DIRECT: Bilirubin, Direct: 0.2 mg/dL (ref 0.0–0.2)

## 2020-06-22 MED ORDER — APIXABAN 2.5 MG PO TABS: 2.5000 mg | ORAL_TABLET | Freq: Two times a day (BID) | ORAL | 3 refills | Status: DC

## 2020-06-22 NOTE — Progress Notes (Signed)
Hematology/Oncology follow up note [facility] [phone] [phone]   Patient Care Team: Birdie Sons, MD as PCP - General (Family Medicine) Lorelee Cover., MD as Consulting Physician (Ophthalmology) Quintin Alto, MD as Referring Physician (Rheumatology) Dorene Ar, MD (Pain Medicine) Ubaldo Glassing Javier Docker, MD as Consulting Physician (Cardiology) Earlie Server, MD as Consulting Physician (Oncology)  REFERRING PROVIDER: Birdie Sons, MD  CHIEF COMPLAINTS/REASON FOR VISIT:  Follow-up for erythrocytosis  HISTORY OF PRESENTING ILLNESS:   Edward Mejia is a  75 y.o.  male with PMH including rheumatoid arthritis, peripheral vascular disease hypertension, Covid infection history,, was seen in consultation at the request of  Fisher, Kirstie Peri, MD  for evaluation of polycytosis Patient was accompanied by her wife who participated in providing medical history. February 2021, patient was hospitalized due to COVID-19 pneumonia, respiratory failure, treated with steroids, remdesivir, received bamlanivimab.  Hospitalization was also completed by [facility], bleeding was  thought to be secondary to hemorrhoids.  He was discharged on 04/07/2019 and presented back to emergency room on 04/12/2019 due to left lower extremity swelling, acute hypoxic respiratory failure.  Work-up showed acute bilateral pulmonary embolism, large left lower extremity DVT, thrombosis and IVC and left external iliac vein.  Patient was started on anticoagulation and transition to Eliquis Patient has been on Eliquis since March 2021.  Patient also takes aspirin.  Patient has history of CAD, follows up with Dr. Ubaldo Glassing.  Rheumatoid arthritis on Enbrel once a week.  Also on prednisone 7.5 mg daily. Patient has also been found to have erythrocytosis. 09/20/2019, CBC showed hemoglobin of 17.6, reviewed previous medical records, erythrocytosis can be traced back to at least 2017.  Patient also has  leukocytosis since March 2021.  Leukocytosis has trended down recently and CBC on 09/20/2019 showed a white count of 10.8, predominantly monocytosis.  Patient is a former smoker, 10-12-pack-year smoking history and he stopped smoking in 1984. Currently he has some chronic shortness of breath with exertion.  He has lost weight during his previous hospitalization and has gained weight since discharge.  Sometimes he feels sweaty.  Patient also reports intermittent dizziness/lightheaded episodes for the past couple of months.  He actually had an episode during today's visit and is spontaneously resolved after few minutes.  Denies any shortness of breath, chest pain, vision changes, focal deficits.  Patient has been referred to establish care with neurology for further evaluation.  #  psoriatic arthritis follows up with [facility] Dr. Abner Greenspan.on chronic prednisone.   INTERVAL HISTORY Edward Mejia is a 75 y.o. male who has above history reviewed by me today presents for follow up visit for management of polycythemia vera Patient has been on Eliquis 2.5 mg twice daily.  No bleeding events. He has recently had a skin squamous cell carcinoma removal from his scalp. Takes hydroxyurea 1000 mg daily except 1 day/week he takes 500 mg daily Patient had recent steroid injection to his joints.  .Review of Systems  Constitutional: Negative for appetite change, chills, fatigue, fever and unexpected weight change.  HENT:   Negative for hearing loss and voice change.   Eyes: Negative for eye problems and icterus.  Respiratory: Negative for chest tightness, cough and shortness of breath.   Cardiovascular: Negative for chest pain and leg swelling.  Gastrointestinal: Negative for abdominal distention, abdominal pain and blood in stool.  Endocrine: Negative for hot flashes.  Genitourinary: Negative for difficulty urinating, dysuria and frequency.   Musculoskeletal: Negative for arthralgias.  Skin: Negative for  itching and rash.       Squamous cell carcinoma of the skin status post resection by dermatology recently.  Neurological: Negative for extremity weakness, light-headedness and numbness.  Hematological: Negative for adenopathy. Does not bruise/bleed easily.  Psychiatric/Behavioral: Negative for confusion.    MEDICAL HISTORY:  Past Medical History:  Diagnosis Date  . Arthritis   . Bilateral pulmonary embolism ([place]) 03/2019   in setting of Covid-19 pneumonia  . Cancer ([place])    skin cancer  . Cellulitis   . Cellulitis   . DVT (deep venous thrombosis) ([place]) 03/2019   in setting of Covid-19 pneumonia  . Erythrocytosis 09/20/2019  . H/O adenomatous polyp of colon 03/24/2015  . History of brain disorder: history of amaurosis fugax 03/24/2015  . History of pneumonia 04/04/2008  . History of rheumatic fever 03/24/2015   DID Have Rheumatic Fever.    . Hypertension   . Peptic ulcer disease   . Sepsis ([place]) 04/12/2019   secondary to Covid pneumonia    SURGICAL HISTORY: Past Surgical History:  Procedure Laterality Date  . APPENDECTOMY    . LEFT HEART CATH AND CORONARY ANGIOGRAPHY Left 02/06/2018   Procedure: LEFT HEART CATH AND CORONARY ANGIOGRAPHY;  Surgeon: Teodoro Spray, MD;  Location: [place] [facility];  Service: Cardiovascular;  Laterality: Left;  Marland Kitchen MANDIBLE FRACTURE SURGERY     fractured jaw  . PARTIAL GASTRECTOMY     peptic ulcer disease    SOCIAL HISTORY: Social History   Socioeconomic History  . Marital status: Married    Spouse name: Lovey Newcomer   . Number of children: 4  . Years of education: Not on file  . Highest education level: 8th grade  Occupational History  . Occupation: Disabled    Comment: due to Psoriatic Arthritis  Tobacco Use  . Smoking status: Former Smoker    Quit date: 09/20/1982    Years since quitting: 37.7  . Smokeless tobacco: Never Used  . Tobacco comment: quit over 40+ years ago  Vaping Use  . Vaping Use: Never used  Substance and Sexual  Activity  . Alcohol use: No    Alcohol/week: 0.0 standard drinks  . Drug use: No  . Sexual activity: Not on file  Other Topics Concern  . Not on file  Social History Narrative  . Not on file   Social Determinants of Health   Financial Resource Strain: Not on file  Food Insecurity: Not on file  Transportation Needs: Not on file  Physical Activity: Not on file  Stress: Not on file  Social Connections: Not on file  Intimate Partner Violence: Not on file    FAMILY HISTORY: Family History  Problem Relation Age of Onset  . Cancer Mother        lung cancer  . Heart disease Father 55       heart attack  . Cancer Brother        pancreatic cancer  . Cancer Brother        pancreatic cancer    ALLERGIES:  is allergic to pravachol [pravastatin].  MEDICATIONS:  Current Outpatient Medications  Medication Sig Dispense Refill  . acetaminophen (TYLENOL) 500 MG tablet Take 1,000 mg by mouth 3 (three) times daily as needed for mild pain or headache.     . albuterol (VENTOLIN HFA) 108 (90 Base) MCG/ACT inhaler Inhale 2 puffs into the lungs every 6 (six) hours as needed for wheezing or shortness of breath.    Marland Kitchen aspirin  EC 81 MG tablet Take 81 mg by mouth daily. Swallow whole.    . augmented betamethasone dipropionate (DIPROLENE-AF) 0.05 % cream Apply topically 2 (two) times daily as needed.    . etanercept (ENBREL) 50 MG/ML injection Inject 50 mg into the skin every Thursday.     . gabapentin (NEURONTIN) 300 MG capsule Take 900 mg by mouth See admin instructions.     Marland Kitchen HYDROcodone-acetaminophen (NORCO) 7.5-325 MG tablet Take 1 tablet by mouth 3 (three) times daily as needed for moderate pain. (Patient taking differently: Take 1 tablet by mouth every 6 (six) hours.) 90 tablet 0  . hydrocortisone 2.5 % cream Apply topically 2 (two) times daily. As needed 30 g 3  . hydroxyurea (HYDREA) 500 MG capsule Take 1500mg  (32 tablets ) daily (Patient taking differently: Take 1,000 mg by mouth daily. Take  1500mg  (32 tablets ) daily) 180 capsule 3  . isosorbide mononitrate (IMDUR) 120 MG 24 hr tablet Take 120 mg by mouth daily.     . predniSONE (DELTASONE) 5 MG tablet Take 7.5 mg by mouth every morning.    . ranolazine (RANEXA) 500 MG 12 hr tablet Take 500 mg by mouth in the morning and at bedtime.    . valsartan-hydrochlorothiazide (DIOVAN-HCT) 160-12.5 MG tablet TAKE 1 TABLET BY MOUTH DAILY 30 tablet 0  . apixaban (ELIQUIS) 2.5 MG TABS tablet Take 1 tablet (2.5 mg total) by mouth 2 (two) times daily. 60 tablet 3  . nitroGLYCERIN (NITROSTAT) 0.4 MG SL tablet Place 0.4 mg under the tongue every 5 (five) minutes as needed for chest pain.  (Patient not taking: No sig reported)     No current facility-administered medications for this visit.     PHYSICAL EXAMINATION: ECOG PERFORMANCE STATUS: 1 - Symptomatic but completely ambulatory Today's Vitals   06/22/20 1314  BP: 128/76  Pulse: 79  Resp: 18  Temp: 98.1 F (36.7 C)  Weight: 225 lb 11.2 oz (102.4 kg)  PainSc: 5    Body mass index is 28.98 kg/m.  Marland Kitchen Physical Exam Constitutional:      General: He is not in acute distress. HENT:     Head: Normocephalic and atraumatic.  Eyes:     General: No scleral icterus. Cardiovascular:     Rate and Rhythm: Normal rate and regular rhythm.     Heart sounds: Normal heart sounds.  Pulmonary:     Effort: Pulmonary effort is normal. No respiratory distress.     Breath sounds: No wheezing.  Abdominal:     General: Bowel sounds are normal. There is no distension.     Palpations: Abdomen is soft.  Musculoskeletal:        General: No deformity. Normal range of motion.     Cervical back: Normal range of motion and neck supple.  Skin:    General: Skin is warm and dry.     Comments: Scalp skin lesion status post resection.  Neurological:     Mental Status: He is alert and oriented to person, place, and time. Mental status is at baseline.     Cranial Nerves: No cranial nerve deficit.      Coordination: Coordination normal.  Psychiatric:        Mood and Affect: Mood normal.     LABORATORY DATA:  I have reviewed the data as listed Lab Results  Component Value Date   WBC 12.0 (H) 06/22/2020   HGB 16.1 06/22/2020   HCT 49.3 06/22/2020   MCV 102.7 (H) 06/22/2020  PLT 238 06/22/2020   Recent Labs    09/17/19 1049 11/12/19 1315 12/16/19 1404 12/27/19 0922 02/21/20 1255 03/20/20 1321 06/22/20 1241  NA 133*   < >  --    < > 130* 129* 129*  K 4.6   < >  --    < > 4.0 4.6 4.0  CL 93*   < >  --    < > 95* 94* 95*  CO2 23   < >  --    < > 27 27 26   GLUCOSE 61*   < >  --    < > 92 106* 177*  BUN 12   < >  --    < > 17 12 17   CREATININE 1.23   < >  --    < > 1.38* 1.12 1.19  CALCIUM 9.1   < >  --    < > 8.3* 8.6* 8.4*  GFRNONAA 57*   < >  --    < > 53* >60 >60  GFRAA 66  --   --   --   --   --   --   PROT 6.2   < > 6.4*   < > 5.8* 6.0* 6.4*  ALBUMIN 4.2   < > 4.0   < > 3.8 3.8 4.0  AST 14   < > 16   < > 15 18 22   ALT 13   < > 16   < > 15 22 26   ALKPHOS 51   < > 35*   < > 27* 31* 31*  BILITOT 1.0   < > 1.5*   < > 0.9 1.2 1.6*  BILIDIR  --   --  0.3*  --   --   --  0.2  IBILI  --   --  1.2*  --   --   --   --    < > = values in this interval not displayed.   Iron/TIBC/Ferritin/ %Sat    Component Value Date/Time   IRON 49 09/20/2019 1201   TIBC 318 09/20/2019 1201   FERRITIN 24 09/20/2019 1201   IRONPCTSAT 15 (L) 09/20/2019 1201      RADIOGRAPHIC STUDIES: I have personally reviewed the radiological images as listed and agreed with the findings in the report. No results found.    ASSESSMENT & PLAN:  1. Polycythemia vera ([place])   2. JAK2 V617F mutation   3. History of pulmonary embolism   4. Monoclonal B-cell lymphocytosis of undetermined significance    #Polycythemia vera,  JAK2 V617F mutation. Labs reviewed and discussed with patient No need for phlebotomy today.  Hematocrit is not at goal probably due to recent reduction of the hydroxyurea  dose. Continue hydroxyurea, recommend to increase dose to 1000 mg daily, and take 500 mg on 1 out of 14 days. Monitor CBC monthly. Continue Aspirin 81mg  Continue hydroxyurea 1000 mg 6 days/week and take 500 mg 1 day/week.  # History of VTE, continue Elqiuis 2.5mg  BID.  # monoclonal lymphocytosis of unknown significance.  Continue observation.  Mild leukocytosis likely due to recent steroid injection. # Hyponatremia, chronic since 2019. Monitor.  Possibly due to HCTZ.  I will defer to primary care provider to manage.  Orders Placed This Encounter  Procedures  . Bilirubin, direct    Standing Status:   Future    Number of Occurrences:   1    Standing Expiration Date:   06/22/2021    All questions were  answered. The patient knows to call the clinic with any problems questions or concerns.  cc Birdie Sons, MD  Follow-up monthly labs with CBC.  Lab MD CBC CMP in 3 months  Earlie Server, MD, PhD Hematology Oncology [facility] at [facility] Pager- [phone] 06/22/2020

## 2020-06-22 NOTE — Progress Notes (Signed)
No phlebotomy today per Dr Yu. °

## 2020-06-22 NOTE — Progress Notes (Signed)
Pt here for follow up. No new concerns voiced.   

## 2020-07-02 ENCOUNTER — Other Ambulatory Visit: Payer: Self-pay | Admitting: Family Medicine

## 2020-07-02 DIAGNOSIS — I10 Essential (primary) hypertension: Secondary | ICD-10-CM

## 2020-07-02 NOTE — Telephone Encounter (Signed)
Requested medication (s) are due for refill today: No  Requested medication (s) are on the active medication list: Yes  Last refill:  07/02/20  Future visit scheduled: Yes  Notes to clinic:  Pt. Scheduled for 07/20/20. Wife reports he needs to be seen sooner per Dr. Tasia Catchings to discuss low sodium as related to his medications. Please advise wife if he can be seen sooner.    Requested Prescriptions  Pending Prescriptions Disp Refills   valsartan-hydrochlorothiazide (DIOVAN-HCT) 160-12.5 MG tablet [Pharmacy Med Name: VALSARTAN/HCTZ 160MG /12.5MG  TABLETS] 90 tablet     Sig: Take 1 tablet by mouth daily.      Cardiovascular: ARB + Diuretic Combos Failed - 07/02/2020  7:36 AM      Failed - Na in normal range and within 180 days    Sodium  Date Value Ref Range Status  06/22/2020 129 (L) 135 - 145 mmol/L Final  09/17/2019 133 (L) 134 - 144 mmol/L Final          Failed - Ca in normal range and within 180 days    Calcium  Date Value Ref Range Status  06/22/2020 8.4 (L) 8.9 - 10.3 mg/dL Final   Calcium, Ion  Date Value Ref Range Status  04/03/2019 1.10 (L) 1.15 - 1.40 mmol/L Final          Failed - Valid encounter within last 6 months    Recent Outpatient Visits           9 months ago Acute deep vein thrombosis (DVT) of distal vein of left lower extremity [facility]   [facility] Birdie Sons, MD   1 year ago Oral herpes   [facility] [place], Dionne Bucy, MD   1 year ago Acute deep vein thrombosis (DVT) of distal vein of left lower extremity [facility])   [facility] [place], Dionne Bucy, MD   1 year ago Pneumonia of right lung due to infectious organism, unspecified part of lung   [facility] Birdie Sons, MD   1 year ago Bilateral pulmonary embolism [facility])   [facility] Birdie Sons, MD       Future Appointments             In 2 weeks Fisher, Kirstie Peri, MD [facility]              Passed - K in normal range and within 180 days    Potassium  Date Value Ref Range Status  06/22/2020 4.0 3.5 - 5.1 mmol/L Final          Passed - Cr in normal range and within 180 days    Creatinine, Ser  Date Value Ref Range Status  06/22/2020 1.19 0.61 - 1.24 mg/dL Final   Creatinine, Urine  Date Value Ref Range Status  04/14/2019 106.78 mg/dL Final    Comment:    Performed at [facility], [place] [address] [place], [place] [address]          Passed - Patient is not pregnant      Passed - Last BP in normal range    BP Readings from Last 1 Encounters:  06/22/20 128/76

## 2020-07-02 NOTE — Telephone Encounter (Signed)
Requested medications are due for refill today.  yes  Requested medications are on the active medications list.  yes  Last refill. 06/04/2020  Future visit scheduled.   no  Notes to clinic.  Courtesy refill already given.

## 2020-07-20 ENCOUNTER — Other Ambulatory Visit: Payer: Self-pay

## 2020-07-20 ENCOUNTER — Ambulatory Visit (INDEPENDENT_AMBULATORY_CARE_PROVIDER_SITE_OTHER): Payer: Medicare Other | Admitting: Family Medicine

## 2020-07-20 ENCOUNTER — Encounter: Payer: Self-pay | Admitting: Family Medicine

## 2020-07-20 VITALS — BP 114/78 | HR 80 | Wt 228.0 lb

## 2020-07-20 DIAGNOSIS — E871 Hypo-osmolality and hyponatremia: Secondary | ICD-10-CM

## 2020-07-20 DIAGNOSIS — I10 Essential (primary) hypertension: Secondary | ICD-10-CM | POA: Diagnosis not present

## 2020-07-20 MED ORDER — VALSARTAN 160 MG PO TABS: 160.0000 mg | ORAL_TABLET | Freq: Every day | ORAL | 1 refills | Status: DC

## 2020-07-20 NOTE — Progress Notes (Signed)
Established patient visit   Patient: Edward Mejia   DOB: Jul 17, 1945   75 y.o. Male  MRN: [id number] Visit Date: 07/20/2020  Today's healthcare provider: Lelon Huh, MD   No chief complaint on file.  Subjective    HPI  Pt is coming in today to discuss stopping HCTZ.  Per Dr. Tasia Catchings pt's sodium has been running low a few years.      Medications: Outpatient Medications Prior to Visit  Medication Sig   acetaminophen (TYLENOL) 500 MG tablet Take 1,000 mg by mouth 3 (three) times daily as needed for mild pain or headache.    albuterol (VENTOLIN HFA) 108 (90 Base) MCG/ACT inhaler Inhale 2 puffs into the lungs every 6 (six) hours as needed for wheezing or shortness of breath.   apixaban (ELIQUIS) 2.5 MG TABS tablet Take 1 tablet (2.5 mg total) by mouth 2 (two) times daily.   aspirin EC 81 MG tablet Take 81 mg by mouth daily. Swallow whole.   augmented betamethasone dipropionate (DIPROLENE-AF) 0.05 % cream Apply topically 2 (two) times daily as needed.   etanercept (ENBREL) 50 MG/ML injection Inject 50 mg into the skin every Thursday.    gabapentin (NEURONTIN) 300 MG capsule Take 900 mg by mouth See admin instructions.    hydrocortisone 2.5 % cream Apply topically 2 (two) times daily. As needed   hydroxyurea (HYDREA) 500 MG capsule Take 1500mg  (32 tablets ) daily (Patient taking differently: Take 1,000 mg by mouth daily. Take 1500mg  (32 tablets ) daily)   isosorbide mononitrate (IMDUR) 120 MG 24 hr tablet Take 120 mg by mouth daily.    predniSONE (DELTASONE) 5 MG tablet Take 7.5 mg by mouth every morning.   ranolazine (RANEXA) 500 MG 12 hr tablet Take 500 mg by mouth in the morning and at bedtime.   valsartan-hydrochlorothiazide (DIOVAN-HCT) 160-12.5 MG tablet TAKE 1 TABLET BY MOUTH DAILY   HYDROcodone-acetaminophen (NORCO) 7.5-325 MG tablet Take 1 tablet by mouth 3 (three) times daily as needed for moderate pain. (Patient taking differently: Take 1 tablet by mouth every 6 (six)  hours.)   nitroGLYCERIN (NITROSTAT) 0.4 MG SL tablet Place 0.4 mg under the tongue every 5 (five) minutes as needed for chest pain.  (Patient not taking: No sig reported)   No facility-administered medications prior to visit.    Review of Systems  Constitutional: Negative.   Respiratory: Negative.    Cardiovascular: Negative.   Gastrointestinal: Negative.   Neurological:  Negative for dizziness, light-headedness and headaches.      Objective    BP 114/78 (BP Location: Right Arm, Patient Position: Sitting, Cuff Size: Large)   Pulse 80   Wt 228 lb (103.4 kg)   SpO2 100%   BMI 29.27 kg/m    Physical Exam  General appearance:  Overweight male, cooperative and in no acute distress Head: Normocephalic, without obvious abnormality, atraumatic Respiratory: Respirations even and unlabored, normal respiratory rate Extremities: All extremities are intact.  Skin: Skin color, texture, turgor normal. No rashes seen  Psych: Appropriate mood and affect. Neurologic: Mental status: Alert, oriented to person, place, and time, thought content appropriate.     Assessment & Plan     1. Benign essential HTN Very well controlled. Change valsartan-hctz to - valsartan (DIOVAN) 160 MG tablet; Take 1 tablet (160 mg total) by mouth daily.  Dispense: 30 tablet; Refill: 1  2. Hyponatremia D/c hctz as above. He has follow up Dr. Tasia Catchings with labs next month and will see how  things look at that time.       The entirety of the information documented in the History of Present Illness, Review of Systems and Physical Exam were personally obtained by me. Portions of this information were initially documented by the CMA and reviewed by me for thoroughness and accuracy.     Lelon Huh, MD  [facility] [phone] (phone) [phone] (fax)  [place]

## 2020-07-22 DIAGNOSIS — D225 Melanocytic nevi of trunk: Secondary | ICD-10-CM | POA: Diagnosis not present

## 2020-07-22 DIAGNOSIS — Z85828 Personal history of other malignant neoplasm of skin: Secondary | ICD-10-CM | POA: Diagnosis not present

## 2020-07-22 DIAGNOSIS — L570 Actinic keratosis: Secondary | ICD-10-CM | POA: Diagnosis not present

## 2020-07-22 DIAGNOSIS — D2262 Melanocytic nevi of left upper limb, including shoulder: Secondary | ICD-10-CM | POA: Diagnosis not present

## 2020-07-23 ENCOUNTER — Other Ambulatory Visit: Payer: Self-pay

## 2020-07-23 ENCOUNTER — Inpatient Hospital Stay: Payer: Medicare Other | Attending: Oncology

## 2020-07-23 DIAGNOSIS — D45 Polycythemia vera: Secondary | ICD-10-CM | POA: Diagnosis not present

## 2020-07-23 LAB — COMPREHENSIVE METABOLIC PANEL
ALT: 21 U/L (ref 0–44)
AST: 18 U/L (ref 15–41)
Albumin: 3.8 g/dL (ref 3.5–5.0)
Alkaline Phosphatase: 33 U/L — ABNORMAL LOW (ref 38–126)
Anion gap: 8 (ref 5–15)
BUN: 16 mg/dL (ref 8–23)
CO2: 28 mmol/L (ref 22–32)
Calcium: 8.5 mg/dL — ABNORMAL LOW (ref 8.9–10.3)
Chloride: 95 mmol/L — ABNORMAL LOW (ref 98–111)
Creatinine, Ser: 1.1 mg/dL (ref 0.61–1.24)
GFR, Estimated: >60 mL/min (ref >60)
Glucose, Bld: 143 mg/dL — ABNORMAL HIGH (ref 70–99)
Potassium: 4.3 mmol/L (ref 3.5–5.1)
Sodium: 131 mmol/L — ABNORMAL LOW (ref 135–145)
Total Bilirubin: 1.7 mg/dL — ABNORMAL HIGH (ref 0.3–1.2)
Total Protein: 6.1 g/dL — ABNORMAL LOW (ref 6.5–8.1)

## 2020-07-23 LAB — CBC WITH DIFFERENTIAL/PLATELET
Abs Immature Granulocytes: 0.45 10*3/uL — ABNORMAL HIGH (ref 0.00–0.07)
Basophils Absolute: 0.1 10*3/uL (ref 0.0–0.1)
Basophils Relative: 1 %
Eosinophils Absolute: 0 10*3/uL (ref 0.0–0.5)
Eosinophils Relative: 0 %
HCT: 48.8 % (ref 39.0–52.0)
Hemoglobin: 16.3 g/dL (ref 13.0–17.0)
Immature Granulocytes: 4 %
Lymphocytes Relative: 23 %
Lymphs Abs: 2.9 10*3/uL (ref 0.7–4.0)
MCH: 35.1 pg — ABNORMAL HIGH (ref 26.0–34.0)
MCHC: 33.4 g/dL (ref 30.0–36.0)
MCV: 104.9 fL — ABNORMAL HIGH (ref 80.0–100.0)
Monocytes Absolute: 0.7 10*3/uL (ref 0.1–1.0)
Monocytes Relative: 6 %
Neutro Abs: 8.3 10*3/uL — ABNORMAL HIGH (ref 1.7–7.7)
Neutrophils Relative %: 66 %
Platelets: 236 10*3/uL (ref 150–400)
RBC: 4.65 MIL/uL (ref 4.22–5.81)
RDW: 16.8 % — ABNORMAL HIGH (ref 11.5–15.5)
WBC: 12.5 10*3/uL — ABNORMAL HIGH (ref 4.0–10.5)
nRBC: 0 % (ref 0.0–0.2)

## 2020-07-27 DIAGNOSIS — I25119 Atherosclerotic heart disease of native coronary artery with unspecified angina pectoris: Secondary | ICD-10-CM | POA: Diagnosis not present

## 2020-07-27 DIAGNOSIS — Z7901 Long term (current) use of anticoagulants: Secondary | ICD-10-CM | POA: Diagnosis not present

## 2020-07-27 DIAGNOSIS — D45 Polycythemia vera: Secondary | ICD-10-CM | POA: Diagnosis not present

## 2020-07-27 DIAGNOSIS — I2699 Other pulmonary embolism without acute cor pulmonale: Secondary | ICD-10-CM | POA: Diagnosis not present

## 2020-08-18 DIAGNOSIS — M8589 Other specified disorders of bone density and structure, multiple sites: Secondary | ICD-10-CM | POA: Diagnosis not present

## 2020-08-18 DIAGNOSIS — Z7952 Long term (current) use of systemic steroids: Secondary | ICD-10-CM | POA: Diagnosis not present

## 2020-08-18 DIAGNOSIS — M85851 Other specified disorders of bone density and structure, right thigh: Secondary | ICD-10-CM | POA: Diagnosis not present

## 2020-08-19 DIAGNOSIS — H524 Presbyopia: Secondary | ICD-10-CM | POA: Diagnosis not present

## 2020-08-19 DIAGNOSIS — Z961 Presence of intraocular lens: Secondary | ICD-10-CM | POA: Diagnosis not present

## 2020-08-21 DIAGNOSIS — Z85828 Personal history of other malignant neoplasm of skin: Secondary | ICD-10-CM | POA: Diagnosis not present

## 2020-08-21 DIAGNOSIS — L244 Irritant contact dermatitis due to drugs in contact with skin: Secondary | ICD-10-CM | POA: Diagnosis not present

## 2020-08-21 DIAGNOSIS — L821 Other seborrheic keratosis: Secondary | ICD-10-CM | POA: Diagnosis not present

## 2020-08-24 ENCOUNTER — Other Ambulatory Visit: Payer: Self-pay

## 2020-08-24 ENCOUNTER — Inpatient Hospital Stay: Payer: Medicare Other | Attending: Oncology

## 2020-08-24 DIAGNOSIS — D45 Polycythemia vera: Secondary | ICD-10-CM | POA: Diagnosis not present

## 2020-08-24 LAB — CBC WITH DIFFERENTIAL/PLATELET
Abs Immature Granulocytes: 0 10*3/uL (ref 0.00–0.07)
Band Neutrophils: 0 %
Basophils Absolute: 0.1 10*3/uL (ref 0.0–0.1)
Basophils Relative: 1 %
Blasts: 0 %
Eosinophils Absolute: 0.1 10*3/uL (ref 0.0–0.5)
Eosinophils Relative: 1 %
HCT: 48.2 % (ref 39.0–52.0)
Hemoglobin: 16.2 g/dL (ref 13.0–17.0)
Lymphocytes Relative: 23 %
Lymphs Abs: 2.4 10*3/uL (ref 0.7–4.0)
MCH: 36.8 pg — ABNORMAL HIGH (ref 26.0–34.0)
MCHC: 33.6 g/dL (ref 30.0–36.0)
MCV: 109.5 fL — ABNORMAL HIGH (ref 80.0–100.0)
Metamyelocytes Relative: 0 %
Monocytes Absolute: 0.7 10*3/uL (ref 0.1–1.0)
Monocytes Relative: 7 %
Myelocytes: 0 %
Neutro Abs: 7.2 10*3/uL (ref 1.7–7.7)
Neutrophils Relative %: 68 %
Other: 0 %
Platelets: 173 10*3/uL (ref 150–400)
Promyelocytes Relative: 0 %
RBC: 4.4 MIL/uL (ref 4.22–5.81)
RDW: 17.6 % — ABNORMAL HIGH (ref 11.5–15.5)
WBC: 10.5 10*3/uL (ref 4.0–10.5)
nRBC: 0 % (ref 0.0–0.2)
nRBC: 0 /100 WBC

## 2020-08-28 DIAGNOSIS — R0602 Shortness of breath: Secondary | ICD-10-CM | POA: Diagnosis not present

## 2020-09-09 DIAGNOSIS — L409 Psoriasis, unspecified: Secondary | ICD-10-CM | POA: Diagnosis not present

## 2020-09-09 DIAGNOSIS — M25511 Pain in right shoulder: Secondary | ICD-10-CM | POA: Diagnosis not present

## 2020-09-09 DIAGNOSIS — G8929 Other chronic pain: Secondary | ICD-10-CM | POA: Diagnosis not present

## 2020-09-09 DIAGNOSIS — Z79899 Other long term (current) drug therapy: Secondary | ICD-10-CM | POA: Diagnosis not present

## 2020-09-09 DIAGNOSIS — L4050 Arthropathic psoriasis, unspecified: Secondary | ICD-10-CM | POA: Diagnosis not present

## 2020-09-09 DIAGNOSIS — M7541 Impingement syndrome of right shoulder: Secondary | ICD-10-CM | POA: Diagnosis not present

## 2020-09-13 ENCOUNTER — Other Ambulatory Visit: Payer: Self-pay | Admitting: Family Medicine

## 2020-09-13 DIAGNOSIS — I10 Essential (primary) hypertension: Secondary | ICD-10-CM

## 2020-09-13 NOTE — Telephone Encounter (Signed)
Requested Prescriptions  Pending Prescriptions Disp Refills  . valsartan (DIOVAN) 160 MG tablet [Pharmacy Med Name: VALSARTAN '160MG'$  TABLETS] 90 tablet 0    Sig: TAKE 1 TABLET(160 MG) BY MOUTH DAILY     Cardiovascular:  Angiotensin Receptor Blockers Passed - 09/13/2020  9:34 AM      Passed - Cr in normal range and within 180 days    Creatinine, Ser  Date Value Ref Range Status  07/23/2020 1.10 0.61 - 1.24 mg/dL Final   Creatinine, Urine  Date Value Ref Range Status  04/14/2019 106.78 mg/dL Final    Comment:    Performed at [facility], [place] [address], [place], [place] [address]         Passed - K in normal range and within 180 days    Potassium  Date Value Ref Range Status  07/23/2020 4.3 3.5 - 5.1 mmol/L Final         Passed - Patient is not pregnant      Passed - Last BP in normal range    BP Readings from Last 1 Encounters:  07/20/20 114/78         Passed - Valid encounter within last 6 months    Recent Outpatient Visits          1 month ago Benign essential HTN   [facility] Birdie Sons, MD   12 months ago Acute deep vein thrombosis (DVT) of distal vein of left lower extremity [facility])   [facility] Birdie Sons, MD   1 year ago Oral herpes   [facility] [place], Dionne Bucy, MD   1 year ago Acute deep vein thrombosis (DVT) of distal vein of left lower extremity [facility])   [facility], Dionne Bucy, MD   1 year ago Pneumonia of right lung due to infectious organism, unspecified part of lung   [facility] Birdie Sons, MD

## 2020-09-17 ENCOUNTER — Other Ambulatory Visit: Payer: Self-pay

## 2020-09-17 DIAGNOSIS — D45 Polycythemia vera: Secondary | ICD-10-CM

## 2020-09-18 DIAGNOSIS — H02839 Dermatochalasis of unspecified eye, unspecified eyelid: Secondary | ICD-10-CM | POA: Diagnosis not present

## 2020-09-18 DIAGNOSIS — H26493 Other secondary cataract, bilateral: Secondary | ICD-10-CM | POA: Diagnosis not present

## 2020-09-18 DIAGNOSIS — Z961 Presence of intraocular lens: Secondary | ICD-10-CM | POA: Diagnosis not present

## 2020-09-18 DIAGNOSIS — H43813 Vitreous degeneration, bilateral: Secondary | ICD-10-CM | POA: Diagnosis not present

## 2020-09-18 DIAGNOSIS — H26491 Other secondary cataract, right eye: Secondary | ICD-10-CM | POA: Diagnosis not present

## 2020-09-22 ENCOUNTER — Inpatient Hospital Stay: Payer: Medicare Other

## 2020-09-22 ENCOUNTER — Other Ambulatory Visit: Payer: Self-pay

## 2020-09-22 ENCOUNTER — Inpatient Hospital Stay: Payer: Medicare Other | Attending: Oncology

## 2020-09-22 ENCOUNTER — Inpatient Hospital Stay: Payer: Medicare Other | Admitting: Oncology

## 2020-09-22 ENCOUNTER — Encounter: Payer: Self-pay | Admitting: Oncology

## 2020-09-22 VITALS — BP 140/80 | HR 66 | Resp 17

## 2020-09-22 VITALS — BP 136/78 | HR 70 | Temp 97.7°F | Resp 16 | Ht 74.0 in | Wt 227.0 lb

## 2020-09-22 DIAGNOSIS — D751 Secondary polycythemia: Secondary | ICD-10-CM

## 2020-09-22 DIAGNOSIS — Z86711 Personal history of pulmonary embolism: Secondary | ICD-10-CM | POA: Diagnosis not present

## 2020-09-22 DIAGNOSIS — D45 Polycythemia vera: Secondary | ICD-10-CM

## 2020-09-22 DIAGNOSIS — M069 Rheumatoid arthritis, unspecified: Secondary | ICD-10-CM | POA: Insufficient documentation

## 2020-09-22 DIAGNOSIS — D72820 Lymphocytosis (symptomatic): Secondary | ICD-10-CM

## 2020-09-22 DIAGNOSIS — I10 Essential (primary) hypertension: Secondary | ICD-10-CM | POA: Diagnosis not present

## 2020-09-22 DIAGNOSIS — I82402 Acute embolism and thrombosis of unspecified deep veins of left lower extremity: Secondary | ICD-10-CM | POA: Diagnosis not present

## 2020-09-22 DIAGNOSIS — Z7901 Long term (current) use of anticoagulants: Secondary | ICD-10-CM | POA: Diagnosis not present

## 2020-09-22 DIAGNOSIS — I739 Peripheral vascular disease, unspecified: Secondary | ICD-10-CM | POA: Diagnosis not present

## 2020-09-22 DIAGNOSIS — Z5111 Encounter for antineoplastic chemotherapy: Secondary | ICD-10-CM

## 2020-09-22 DIAGNOSIS — Z7982 Long term (current) use of aspirin: Secondary | ICD-10-CM | POA: Diagnosis not present

## 2020-09-22 DIAGNOSIS — Z1589 Genetic susceptibility to other disease: Secondary | ICD-10-CM | POA: Diagnosis not present

## 2020-09-22 LAB — COMPREHENSIVE METABOLIC PANEL
ALT: 13 U/L (ref 0–44)
AST: 14 U/L — ABNORMAL LOW (ref 15–41)
Albumin: 3.9 g/dL (ref 3.5–5.0)
Alkaline Phosphatase: 31 U/L — ABNORMAL LOW (ref 38–126)
Anion gap: 6 (ref 5–15)
BUN: 9 mg/dL (ref 8–23)
CO2: 30 mmol/L (ref 22–32)
Calcium: 8.7 mg/dL — ABNORMAL LOW (ref 8.9–10.3)
Chloride: 101 mmol/L (ref 98–111)
Creatinine, Ser: 0.95 mg/dL (ref 0.61–1.24)
GFR, Estimated: >60 mL/min (ref >60)
Glucose, Bld: 125 mg/dL — ABNORMAL HIGH (ref 70–99)
Potassium: 3.8 mmol/L (ref 3.5–5.1)
Sodium: 137 mmol/L (ref 135–145)
Total Bilirubin: 1.9 mg/dL — ABNORMAL HIGH (ref 0.3–1.2)
Total Protein: 6.1 g/dL — ABNORMAL LOW (ref 6.5–8.1)

## 2020-09-22 LAB — CBC WITH DIFFERENTIAL/PLATELET
Abs Immature Granulocytes: 0.35 10*3/uL — ABNORMAL HIGH (ref 0.00–0.07)
Basophils Absolute: 0.1 10*3/uL (ref 0.0–0.1)
Basophils Relative: 1 %
Eosinophils Absolute: 0.1 10*3/uL (ref 0.0–0.5)
Eosinophils Relative: 1 %
HCT: 47.2 % (ref 39.0–52.0)
Hemoglobin: 16.2 g/dL (ref 13.0–17.0)
Immature Granulocytes: 3 %
Lymphocytes Relative: 29 %
Lymphs Abs: 3 10*3/uL (ref 0.7–4.0)
MCH: 38.8 pg — ABNORMAL HIGH (ref 26.0–34.0)
MCHC: 34.3 g/dL (ref 30.0–36.0)
MCV: 112.9 fL — ABNORMAL HIGH (ref 80.0–100.0)
Monocytes Absolute: 0.8 10*3/uL (ref 0.1–1.0)
Monocytes Relative: 8 %
Neutro Abs: 5.9 10*3/uL (ref 1.7–7.7)
Neutrophils Relative %: 58 %
Platelets: 150 10*3/uL (ref 150–400)
RBC: 4.18 MIL/uL — ABNORMAL LOW (ref 4.22–5.81)
RDW: 16.2 % — ABNORMAL HIGH (ref 11.5–15.5)
WBC: 10.2 10*3/uL (ref 4.0–10.5)
nRBC: 0 % (ref 0.0–0.2)

## 2020-09-22 MED ORDER — HYDROXYUREA 500 MG PO CAPS: 1000.0000 mg | ORAL_CAPSULE | Freq: Every day | ORAL | 0 refills | Status: DC

## 2020-09-22 NOTE — Patient Instructions (Signed)

## 2020-09-22 NOTE — Progress Notes (Signed)
Hematology/Oncology follow up note [facility] Telephone:([phone]) [phone] [phone]   Patient Care Team: Edward Mejia as PCP - General (Family Medicine) Edward Mejia as Consulting Physician (Ophthalmology) Edward Mejia as Referring Physician (Rheumatology) Edward Mejia (Pain Medicine) Edward Mejia as Consulting Physician (Cardiology) Edward Mejia as Consulting Physician (Oncology)  REFERRING PROVIDER: Birdie Sons, Mejia  CHIEF COMPLAINTS/REASON FOR VISIT:  Follow-up for erythrocytosis  HISTORY OF PRESENTING ILLNESS:   Edward Mejia is a  75 y.o.  male with PMH including rheumatoid arthritis, peripheral vascular disease hypertension, Covid infection history,, was seen in consultation at the request of  Edward Mejia  for evaluation of polycytosis Patient was accompanied by her wife who participated in providing medical history. February 2021, patient was hospitalized due to COVID-19 pneumonia, respiratory failure, treated with steroids, remdesivir, received bamlanivimab.  Hospitalization was also completed by [facility], bleeding was  thought to be secondary to hemorrhoids.  He was discharged on 04/07/2019 and presented back to emergency room on 04/12/2019 due to left lower extremity swelling, acute hypoxic respiratory failure.  Work-up showed acute bilateral pulmonary embolism, large left lower extremity DVT, thrombosis and IVC and left external iliac vein.  Patient was started on anticoagulation and transition to Eliquis Patient has been on Eliquis since March 2021.  Patient also takes aspirin.  Patient has history of CAD, follows up with Edward Mejia.  Rheumatoid arthritis on Enbrel once a week.  Also on prednisone 7.5 mg daily. Patient has also been found to have erythrocytosis. 09/20/2019, CBC showed hemoglobin of 17.6, reviewed previous medical records, erythrocytosis can be traced back to at least 2017.  Patient also has  leukocytosis since March 2021.  Leukocytosis has trended down recently and CBC on 09/20/2019 showed a white count of 10.8, predominantly monocytosis.  Patient is a former smoker, 10-12-pack-year smoking history and he stopped smoking in 1984. Currently he has some chronic shortness of breath with exertion.  He has lost weight during his previous hospitalization and has gained weight since discharge.  Sometimes he feels sweaty.  Patient also reports intermittent dizziness/lightheaded episodes for the past couple of months.  He actually had an episode during today's visit and is spontaneously resolved after few minutes.  Denies any shortness of breath, chest pain, vision changes, focal deficits.  Patient has been referred to establish care with neurology for further evaluation.  #  psoriatic arthritis follows up with [facility] Edward Mejia.on chronic prednisone.   INTERVAL HISTORY Edward Mejia is a 75 y.o. male who has above history reviewed by me today presents for follow up visit for management of polycythemia vera Patient has been on Eliquis 2.5 mg twice daily.  No bleeding events. He has recently had a skin squamous cell carcinoma removal from his scalp. Takes hydroxyurea 1000 mg daily except 1 day every 2 weeks he takes 500 mg.  Feels more tired and fatigue today.    .Review of Systems  Constitutional:  Positive for fatigue. Negative for appetite change, chills, fever and unexpected weight change.  HENT:   Negative for hearing loss and voice change.   Eyes:  Negative for eye problems and icterus.  Respiratory:  Negative for chest tightness, cough and shortness of breath.   Cardiovascular:  Negative for chest pain and leg swelling.  Gastrointestinal:  Negative for abdominal distention, abdominal pain and blood in stool.  Endocrine: Negative for hot flashes.  Genitourinary:  Negative for difficulty  urinating, dysuria and frequency.   Musculoskeletal:  Negative for arthralgias.  Skin:   Negative for itching and rash.       Squamous cell carcinoma of the skin status post resection by dermatology  Neurological:  Negative for extremity weakness, light-headedness and numbness.  Hematological:  Negative for adenopathy. Does not bruise/bleed easily.  Psychiatric/Behavioral:  Negative for confusion.    MEDICAL HISTORY:  Past Medical History:  Diagnosis Date   Arthritis    Bilateral pulmonary embolism ([place]) 03/2019   in setting of Covid-19 pneumonia   Cancer ([place])    skin cancer   Cellulitis    Cellulitis    DVT (deep venous thrombosis) ([place]) 03/2019   in setting of Covid-19 pneumonia   Erythrocytosis 09/20/2019   H/O adenomatous polyp of colon 03/24/2015   History of brain disorder: history of amaurosis fugax 03/24/2015   History of pneumonia 04/04/2008   History of rheumatic fever 03/24/2015   DID Have Rheumatic Fever.     Hypertension    Peptic ulcer disease    Sepsis ([place]) 04/12/2019   secondary to Covid pneumonia    SURGICAL HISTORY: Past Surgical History:  Procedure Laterality Date   APPENDECTOMY     LEFT HEART CATH AND CORONARY ANGIOGRAPHY Left 02/06/2018   Procedure: LEFT HEART CATH AND CORONARY ANGIOGRAPHY;  Surgeon: Teodoro Spray, Mejia;  Location: [place] [facility];  Service: Cardiovascular;  Laterality: Left;   MANDIBLE FRACTURE SURGERY     fractured jaw   PARTIAL GASTRECTOMY     peptic ulcer disease    SOCIAL HISTORY: Social History   Socioeconomic History   Marital status: Married    Spouse name: Edward Mejia    Number of children: 4   Years of education: Not on file   Highest education level: 8th grade  Occupational History   Occupation: Disabled    Comment: due to Psoriatic Arthritis  Tobacco Use   Smoking status: Former    Types: Cigarettes    Quit date: 09/20/1982    Years since quitting: 38.0   Smokeless tobacco: Never   Tobacco comments:    quit over 40+ years ago  Vaping Use   Vaping Use: Never used  Substance and Sexual Activity    Alcohol use: No    Alcohol/week: 0.0 standard drinks   Drug use: No   Sexual activity: Not on file  Other Topics Concern   Not on file  Social History Narrative   Not on file   Social Determinants of Health   Financial Resource Strain: Not on file  Food Insecurity: Not on file  Transportation Needs: Not on file  Physical Activity: Not on file  Stress: Not on file  Social Connections: Not on file  Intimate Partner Violence: Not on file    FAMILY HISTORY: Family History  Problem Relation Age of Onset   Cancer Mother        lung cancer   Heart disease Father 39       heart attack   Cancer Brother        pancreatic cancer   Cancer Brother        pancreatic cancer    ALLERGIES:  is allergic to pravachol [pravastatin].  MEDICATIONS:  Current Outpatient Medications  Medication Sig Dispense Refill   acetaminophen (TYLENOL) 500 MG tablet Take 1,000 mg by mouth 3 (three) times daily as needed for mild pain or headache.      albuterol (VENTOLIN HFA) 108 (90 Base) MCG/ACT inhaler Inhale 2  puffs into the lungs every 6 (six) hours as needed for wheezing or shortness of breath.     apixaban (ELIQUIS) 2.5 MG TABS tablet Take 1 tablet (2.5 mg total) by mouth 2 (two) times daily. 60 tablet 3   aspirin EC 81 MG tablet Take 81 mg by mouth daily. Swallow whole.     augmented betamethasone dipropionate (DIPROLENE-AF) 0.05 % cream Apply topically 2 (two) times daily as needed.     etanercept (ENBREL) 50 MG/ML injection Inject 50 mg into the skin every Thursday.      gabapentin (NEURONTIN) 300 MG capsule Take 900 mg by mouth See admin instructions.      HYDROcodone-acetaminophen (NORCO) 7.5-325 MG tablet Take 1 tablet by mouth 3 (three) times daily as needed for moderate pain. (Patient taking differently: Take 1 tablet by mouth every 6 (six) hours.) 90 tablet 0   isosorbide mononitrate (IMDUR) 120 MG 24 hr tablet Take 120 mg by mouth daily.      nitroGLYCERIN (NITROSTAT) 0.4 MG SL tablet Place  0.4 mg under the tongue every 5 (five) minutes as needed for chest pain.     predniSONE (DELTASONE) 5 MG tablet Take 7.5 mg by mouth every morning.     ranolazine (RANEXA) 500 MG 12 hr tablet Take 500 mg by mouth in the morning and at bedtime.     valsartan (DIOVAN) 160 MG tablet TAKE 1 TABLET(160 MG) BY MOUTH DAILY 90 tablet 0   hydrocortisone 2.5 % cream Apply topically 2 (two) times daily. As needed (Patient not taking: Reported on 09/22/2020) 30 g 3   hydroxyurea (HYDREA) 500 MG capsule Take 2 capsules (1,000 mg total) by mouth daily. 180 capsule 0   No current facility-administered medications for this visit.     PHYSICAL EXAMINATION: ECOG PERFORMANCE STATUS: 1 - Symptomatic but completely ambulatory Today's Vitals   09/22/20 1303 09/22/20 1310  BP: 136/78   Pulse: 70   Resp: 16   Temp: 97.7 F (36.5 C)   TempSrc: Tympanic   SpO2: 98%   Weight: 227 lb (103 kg)   Height: '6\' 2"'$  (1.88 m)   PainSc: 7  7    Body mass index is 29.15 kg/m.  Marland Kitchen Physical Exam Constitutional:      General: He is not in acute distress. HENT:     Head: Normocephalic and atraumatic.  Eyes:     General: No scleral icterus. Cardiovascular:     Rate and Rhythm: Normal rate and regular rhythm.     Heart sounds: Normal heart sounds.  Pulmonary:     Effort: Pulmonary effort is normal. No respiratory distress.     Breath sounds: No wheezing.  Abdominal:     General: Bowel sounds are normal. There is no distension.     Palpations: Abdomen is soft.  Musculoskeletal:        General: No deformity. Normal range of motion.     Cervical back: Normal range of motion and neck supple.  Skin:    General: Skin is warm and dry.     Comments: Scalp skin lesion status post resection.  Neurological:     Mental Status: He is alert and oriented to person, place, and time. Mental status is at baseline.     Cranial Nerves: No cranial nerve deficit.     Coordination: Coordination normal.  Psychiatric:        Mood  and Affect: Mood normal.    LABORATORY DATA:  I have reviewed the data as listed  Lab Results  Component Value Date   WBC 10.2 09/22/2020   HGB 16.2 09/22/2020   HCT 47.2 09/22/2020   MCV 112.9 (H) 09/22/2020   PLT 150 09/22/2020   Recent Labs    12/16/19 1404 12/27/19 0922 06/22/20 1241 07/23/20 1059 09/22/20 1244  NA  --    < > 129* 131* 137  K  --    < > 4.0 4.3 3.8  CL  --    < > 95* 95* 101  CO2  --    < > '26 28 30  '$ GLUCOSE  --    < > 177* 143* 125*  BUN  --    < > '17 16 9  '$ CREATININE  --    < > 1.19 1.10 0.95  CALCIUM  --    < > 8.4* 8.5* 8.7*  GFRNONAA  --    < > >60 >60 >60  PROT 6.4*   < > 6.4* 6.1* 6.1*  ALBUMIN 4.0   < > 4.0 3.8 3.9  AST 16   < > 22 18 14*  ALT 16   < > '26 21 13  '$ ALKPHOS 35*   < > 31* 33* 31*  BILITOT 1.5*   < > 1.6* 1.7* 1.9*  BILIDIR 0.3*  --  0.2  --   --   IBILI 1.2*  --   --   --   --    < > = values in this interval not displayed.    Iron/TIBC/Ferritin/ %Sat    Component Value Date/Time   IRON 49 09/20/2019 1201   TIBC 318 09/20/2019 1201   FERRITIN 24 09/20/2019 1201   IRONPCTSAT 15 (L) 09/20/2019 1201      RADIOGRAPHIC STUDIES: I have personally reviewed the radiological images as listed and agreed with the findings in the report. No results found.    ASSESSMENT & PLAN:  1. Polycythemia vera ([place])   2. JAK2 V617F mutation   3. History of pulmonary embolism   4. Monoclonal B-cell lymphocytosis of undetermined significance   5. Encounter for antineoplastic chemotherapy    #Polycythemia vera,  JAK2 V617F mutation. Labs are reviewed and discussed with patient. Hct is not at goal, 47.2. Proceed with phlebotomy today.  Increase Hydroxyurea, to '1000mg'$  daily.  Continue Aspirin '81mg'$   # History of VTE, continue Elqiuis 2.'5mg'$  BID.  # monoclonal lymphocytosis of unknown significance.  observation.     Orders Placed This Encounter  Procedures   CBC with Differential/Platelet    Standing Status:   Future    Standing  Expiration Date:   09/22/2021   Comprehensive metabolic panel    Standing Status:   Future    Standing Expiration Date:   09/22/2021    All questions were answered. The patient knows to call the clinic with any problems questions or concerns.  cc Edward Mejia  Follow-up  Lab Mejia CBC CMP in  6 weeks  Edward Server, MD, PhD Hematology Oncology [facility] Pager- [phone] 09/22/2020

## 2020-09-22 NOTE — Progress Notes (Signed)
500 ml blood removed from PIV L AC . Tolerated phlebotomy well. No weakness or dizziness. Declined drink or snack. Feeling well and discharged to home. Accompanied by wife.

## 2020-09-23 ENCOUNTER — Encounter: Payer: Self-pay | Admitting: Oncology

## 2020-09-23 DIAGNOSIS — Z5111 Encounter for antineoplastic chemotherapy: Secondary | ICD-10-CM | POA: Insufficient documentation

## 2020-09-28 ENCOUNTER — Telehealth: Payer: Self-pay | Admitting: Pharmacy Technician

## 2020-09-28 NOTE — Telephone Encounter (Signed)
Spoke to wife and let her know of approval. She will be looking out for a call from [employer] to schedule shipment of Eliquis.  [place] Patient [place] Phone [phone] Fax [phone] 09/28/2020 11:01 AM

## 2020-09-28 NOTE — Telephone Encounter (Signed)
Oral Oncology Patient Advocate Encounter  Patient brought his portion of the Eliquis application from [place] Patient [place] to be submitted with the physicians completed portion.  Application completed and faxed to [phone] on 09/25/20.   [employer] Access Support phone number for follow up is [phone].  This encounter will be updated until final determination.  [place] Patient Edward Mejia Phone [phone] Fax [phone] 09/28/2020 8:54 AM

## 2020-09-28 NOTE — Telephone Encounter (Signed)
Oral Oncology Patient Advocate Encounter  Received notification from [place] Patient [facility] that the patient has been successfully enrolled into their program to receive Eliquis from the manufacturer at $0 out of pocket until 02/06/21.    I will call the patient and let him know of the approval and that he can re-apply next year if assistance is needed.  Specialty Pharmacy that will dispense medication is [facility].  Patient knows to call the office with questions or concerns.   Oral Oncology Clinic will continue to follow.  [place] Patient [place] Phone [phone] Fax [phone] 09/28/2020 8:58 AM

## 2020-10-05 ENCOUNTER — Ambulatory Visit (INDEPENDENT_AMBULATORY_CARE_PROVIDER_SITE_OTHER): Payer: Medicare Other | Admitting: Family Medicine

## 2020-10-05 ENCOUNTER — Other Ambulatory Visit: Payer: Self-pay

## 2020-10-05 ENCOUNTER — Encounter: Payer: Self-pay | Admitting: Family Medicine

## 2020-10-05 VITALS — BP 137/77 | HR 86 | Ht 72.0 in | Wt 229.0 lb

## 2020-10-05 DIAGNOSIS — Z0000 Encounter for general adult medical examination without abnormal findings: Secondary | ICD-10-CM | POA: Diagnosis not present

## 2020-10-05 DIAGNOSIS — Z1211 Encounter for screening for malignant neoplasm of colon: Secondary | ICD-10-CM

## 2020-10-05 DIAGNOSIS — Z125 Encounter for screening for malignant neoplasm of prostate: Secondary | ICD-10-CM | POA: Diagnosis not present

## 2020-10-05 DIAGNOSIS — Z289 Immunization not carried out for unspecified reason: Secondary | ICD-10-CM | POA: Diagnosis not present

## 2020-10-05 DIAGNOSIS — E7800 Pure hypercholesterolemia, unspecified: Secondary | ICD-10-CM

## 2020-10-05 DIAGNOSIS — Z86010 Personal history of colonic polyps: Secondary | ICD-10-CM

## 2020-10-05 DIAGNOSIS — Z23 Encounter for immunization: Secondary | ICD-10-CM

## 2020-10-05 MED ORDER — SHINGRIX 50 MCG/0.5ML IM SUSR: 0.5000 mL | Freq: Once | INTRAMUSCULAR | 0 refills | Status: AC

## 2020-10-05 MED ORDER — TETANUS-DIPHTH-ACELL PERTUSSIS 5-2.5-18.5 LF-MCG/0.5 IM SUSY: 0.5000 mL | PREFILLED_SYRINGE | Freq: Once | INTRAMUSCULAR | 0 refills | Status: AC

## 2020-10-05 NOTE — Patient Instructions (Signed)
Please review the attached list of medications and notify my office if there are any errors.   Please go to the lab draw station in [address] on the second floor of [facility]  when you are fasting for 8 hours. Normal hours are 8:00am to 11:30am and 1:00pm to 4:00pm Monday through Friday   You are due for a follow up colonoscopy to make sure you do not develop any polyps that become cancerous. Please check with Dr. Tasia Catchings to see if you could stop the Eliquis for 2-3 days to have this test done.

## 2020-10-05 NOTE — Progress Notes (Signed)
Complete Physical Exam      Patient: Edward Mejia, Male    DOB: August 22, 1945, 75 y.o.   MRN: [id number] Visit Date: 10/05/2020  Today's Provider: Lelon Huh, MD   No chief complaint on file.  Subjective    Edward Mejia is a 75 y.o. male who presents today for his complete physical examination  He reports consuming a general diet. Exercise is limited by orthopedic condition(s):  . He generally feels fairly well. He reports sleeping well. He does not have additional problems to discuss today.    Medications: Outpatient Medications Prior to Visit  Medication Sig   acetaminophen (TYLENOL) 500 MG tablet Take 1,000 mg by mouth 3 (three) times daily as needed for mild pain or headache.    albuterol (VENTOLIN HFA) 108 (90 Base) MCG/ACT inhaler Inhale 2 puffs into the lungs every 6 (six) hours as needed for wheezing or shortness of breath.   apixaban (ELIQUIS) 2.5 MG TABS tablet Take 1 tablet (2.5 mg total) by mouth 2 (two) times daily.   aspirin EC 81 MG tablet Take 81 mg by mouth daily. Swallow whole.   augmented betamethasone dipropionate (DIPROLENE-AF) 0.05 % cream Apply topically 2 (two) times daily as needed.   etanercept (ENBREL) 50 MG/ML injection Inject 50 mg into the skin every Thursday.    gabapentin (NEURONTIN) 300 MG capsule Take 900 mg by mouth See admin instructions.    HYDROcodone-acetaminophen (NORCO) 10-325 MG tablet Take 1 tablet by mouth every 6 (six) hours as needed.   hydrocortisone 2.5 % cream Apply topically 2 (two) times daily. As needed   hydroxyurea (HYDREA) 500 MG capsule Take 2 capsules (1,000 mg total) by mouth daily.   isosorbide mononitrate (IMDUR) 120 MG 24 hr tablet Take 120 mg by mouth daily.    nitroGLYCERIN (NITROSTAT) 0.4 MG SL tablet Place 0.4 mg under the tongue every 5 (five) minutes as needed for chest pain.   predniSONE (DELTASONE) 5 MG tablet Take 7.5 mg by mouth every morning.   ranolazine (RANEXA) 500 MG 12 hr tablet Take 500 mg by  mouth in the morning and at bedtime.   valsartan (DIOVAN) 160 MG tablet TAKE 1 TABLET(160 MG) BY MOUTH DAILY   HYDROcodone-acetaminophen (NORCO) 7.5-325 MG tablet Take 1 tablet by mouth 3 (three) times daily as needed for moderate pain. (Patient taking differently: Take 1 tablet by mouth every 6 (six) hours.)   No facility-administered medications prior to visit.    Allergies  Allergen Reactions   Pravachol [Pravastatin] Other (See Comments)    Caused FATIGUE    Patient Care Team: Birdie Sons, MD as PCP - General (Family Medicine) Lorelee Cover., MD as Consulting Physician (Ophthalmology) Quintin Alto, MD as Referring Physician (Rheumatology) Dorene Ar, MD (Pain Medicine) Teodoro Spray, MD as Consulting Physician (Cardiology) Earlie Server, MD as Consulting Physician (Oncology)  Review of Systems  Constitutional: Negative.   HENT: Negative.    Eyes: Negative.   Respiratory: Negative.    Cardiovascular: Negative.   Gastrointestinal: Negative.   Endocrine: Negative.   Genitourinary: Negative.   Musculoskeletal: Negative.   Skin: Negative.   Allergic/Immunologic: Negative.   Neurological: Negative.   Hematological: Negative.   Psychiatric/Behavioral: Negative.         Objective    Vitals: BP 137/77 (BP Location: Right Arm, Patient Position: Sitting, Cuff Size: Large)   Pulse 86   Ht 6' (1.829 m)   Wt 229 lb (103.9 kg)   SpO2 100%  BMI 31.06 kg/m    Physical Exam   General Appearance:    Obese male. Alert, cooperative, in no acute distress, appears stated age  Head:    Normocephalic, without obvious abnormality, atraumatic  Eyes:    PERRL, conjunctiva/corneas clear, EOM's intact, fundi    benign, both eyes       Ears:    Normal TM's and external ear canals, both ears  Neck:   Supple, symmetrical, trachea midline, no adenopathy;       thyroid:  No enlargement/tenderness/nodules; no carotid   bruit or JVD  Back:     Symmetric, no curvature, ROM  normal, no CVA tenderness  Lungs:     Clear to auscultation bilaterally, respirations unlabored  Chest wall:    No tenderness or deformity  Heart:    Normal heart rate. Normal rhythm. No murmurs, rubs, or gallops.  S1 and S2 normal  Abdomen:     Soft, non-tender, bowel sounds active all four quadrants,    no masses, no organomegaly  Extremities:   All extremities are intact. No cyanosis or edema  Pulses:   2+ and symmetric all extremities  Skin:   Skin color, texture, turgor normal, no rashes or lesions  Lymph nodes:   Cervical, supraclavicular, and axillary nodes normal  Neurologic:   CNII-XII intact. Normal strength, sensation and reflexes      throughout     Assessment & Plan     1. Annual physical exam   2.  Personal history of colonic polyps - Lipid panel  3. Colon cancer screening Advised he is overdue for colonoscopy due to multiple tubular adenomas and a serrated adenoma on last colonoscopy in 2015. He declined referral today.   4. Prostate cancer screening  - PSA Total (Reflex To Free) ([facility] only)  5. Prescription for Shingrix. Vaccine not administered in office.   - Zoster Vaccine Adjuvanted [facility]) injection; Inject 0.5 mLs into the muscle once for 1 dose. Repeat after 2 months  Dispense: 0.5 mL; Refill: 0  6. Prescription for Tdap. Vaccine not administered in office.   - Tdap ([place]) 5-2.5-18.5 LF-MCG/0.5 injection; Inject 0.5 mLs into the muscle once for 1 dose.  Dispense: 0.5 mL; Refill: 0      The entirety of the information documented in the History of Present Illness, Review of Systems and Physical Exam were personally obtained by me. Portions of this information were initially documented by the CMA and reviewed by me for thoroughness and accuracy.     Lelon Huh, MD  [facility] [phone] (phone) [phone] (fax)  [place]

## 2020-10-05 NOTE — Progress Notes (Signed)
Annual Wellness Visit     Patient: Edward Mejia, Male    DOB: 1945/04/14, 75 y.o.   MRN: [id number] Visit Date: 10/05/2020  Today's Provider: Lelon Huh, MD   No chief complaint on file.  Subjective    Edward Mejia is a 75 y.o. male who presents today for his Annual Wellness Visit.    Medications: Outpatient Medications Prior to Visit  Medication Sig   acetaminophen (TYLENOL) 500 MG tablet Take 1,000 mg by mouth 3 (three) times daily as needed for mild pain or headache.    albuterol (VENTOLIN HFA) 108 (90 Base) MCG/ACT inhaler Inhale 2 puffs into the lungs every 6 (six) hours as needed for wheezing or shortness of breath.   apixaban (ELIQUIS) 2.5 MG TABS tablet Take 1 tablet (2.5 mg total) by mouth 2 (two) times daily.   aspirin EC 81 MG tablet Take 81 mg by mouth daily. Swallow whole.   augmented betamethasone dipropionate (DIPROLENE-AF) 0.05 % cream Apply topically 2 (two) times daily as needed.   etanercept (ENBREL) 50 MG/ML injection Inject 50 mg into the skin every Thursday.    gabapentin (NEURONTIN) 300 MG capsule Take 900 mg by mouth See admin instructions.    HYDROcodone-acetaminophen (NORCO) 10-325 MG tablet Take 1 tablet by mouth every 6 (six) hours as needed.   hydrocortisone 2.5 % cream Apply topically 2 (two) times daily. As needed   hydroxyurea (HYDREA) 500 MG capsule Take 2 capsules (1,000 mg total) by mouth daily.   isosorbide mononitrate (IMDUR) 120 MG 24 hr tablet Take 120 mg by mouth daily.    nitroGLYCERIN (NITROSTAT) 0.4 MG SL tablet Place 0.4 mg under the tongue every 5 (five) minutes as needed for chest pain.   predniSONE (DELTASONE) 5 MG tablet Take 7.5 mg by mouth every morning.   ranolazine (RANEXA) 500 MG 12 hr tablet Take 500 mg by mouth in the morning and at bedtime.   valsartan (DIOVAN) 160 MG tablet TAKE 1 TABLET(160 MG) BY MOUTH DAILY   HYDROcodone-acetaminophen (NORCO) 7.5-325 MG tablet Take 1 tablet by mouth 3 (three) times  daily as needed for moderate pain. (Patient taking differently: Take 1 tablet by mouth every 6 (six) hours.)   No facility-administered medications prior to visit.    Allergies  Allergen Reactions   Pravachol [Pravastatin] Other (See Comments)    Caused FATIGUE    Patient Care Team: Birdie Sons, MD as PCP - General (Family Medicine) Lorelee Cover., MD as Consulting Physician (Ophthalmology) Quintin Alto, MD as Referring Physician (Rheumatology) Dorene Ar, MD (Pain Medicine) Teodoro Spray, MD as Consulting Physician (Cardiology) Earlie Server, MD as Consulting Physician (Oncology)       Objective     Most recent functional status assessment: In your present state of health, do you have any difficulty performing the following activities: 10/05/2020  Hearing? N  Vision? N  Difficulty concentrating or making decisions? N  Walking or climbing stairs? Y  Dressing or bathing? N  Doing errands, shopping? Y  Some recent data might be hidden   Most recent fall risk assessment: Fall Risk  10/05/2020  Falls in the past year? 0  Number falls in past yr: 0  Injury with Fall? 0  Risk for fall due to : No Fall Risks  Follow up Falls evaluation completed    Most recent depression screenings: PHQ 2/9 Scores 10/05/2020 07/20/2020  PHQ - 2 Score 0 0  PHQ- 9 Score 0 4  Most recent cognitive screening: 6CIT Screen 10/05/2020  What Year? 0 points  What month? 0 points  What time? 0 points  Count back from 20 0 points  Months in reverse 2 points  Repeat phrase 4 points  Total Score 6   Most recent Audit-C alcohol use screening Alcohol Use Disorder Test (AUDIT) 10/05/2020  1. How often do you have a drink containing alcohol? 0  2. How many drinks containing alcohol do you have on a typical day when you are drinking? 0  3. How often do you have six or more drinks on one occasion? 0  AUDIT-C Score 0  Alcohol Brief Interventions/Follow-up -   A score of 3 or more in women,  and 4 or more in men indicates increased risk for alcohol abuse, EXCEPT if all of the points are from question 1   No results found for any visits on 10/05/20.  Assessment & Plan     Annual wellness visit done today including the all of the following: Reviewed patient's Family Medical History Reviewed and updated list of patient's medical providers Assessment of cognitive impairment was done Assessed patient's functional ability Established a written schedule for health screening [place] Completed and Reviewed  Exercise Activities and Dietary recommendations  Goals      DIET - EAT MORE FRUITS AND VEGETABLES     Recommend to eat 2 servings of fruits and vegetables each day.      Have 3 meals a day     Recommend eating 3 small meals every day with 2 healthy snacks in between.         Immunization History  Administered Date(s) Administered   DTaP 01/20/2005   Fluad Quad(high Dose 65+) 10/23/2018   Influenza, High Dose Seasonal PF 11/28/2013, 11/26/2015, 12/14/2016, 12/07/2017   Influenza-Unspecified 11/15/2012   PFIZER(Purple Top)SARS-COV-2 Vaccination 07/19/2019, 08/09/2019   Pneumococcal Conjugate-13 12/14/2016   Pneumococcal Polysaccharide-23 01/16/2002, 10/31/2011    Health Maintenance  Topic Date Due   TETANUS/TDAP  Never done   Zoster Vaccines- Shingrix (1 of 2) Never done   COLONOSCOPY (Pts 45-47yr Insurance coverage will need to be confirmed)  02/11/2016   COVID-19 Vaccine (3 - Pfizer risk series) 09/06/2019   INFLUENZA VACCINE  10/29/2023 (Originally 09/07/2020)   Hepatitis C Screening  Completed   PNA vac Low Risk Adult  Completed   HPV VACCINES  Aged Out      No follow-ups on file.     The entirety of the information documented in the History of Present Illness, Review of Systems and Physical Exam were personally obtained by me. Portions of this information were initially documented by the CMA and reviewed by me for thoroughness and  accuracy.     DLelon Huh MD  [phone](phone) [phone](fax)  [place]

## 2020-10-06 DIAGNOSIS — Z125 Encounter for screening for malignant neoplasm of prostate: Secondary | ICD-10-CM | POA: Diagnosis not present

## 2020-10-06 DIAGNOSIS — Z136 Encounter for screening for cardiovascular disorders: Secondary | ICD-10-CM | POA: Diagnosis not present

## 2020-10-07 LAB — LIPID PANEL
Chol/HDL Ratio: 3.8 ratio (ref 0.0–5.0)
Cholesterol, Total: 148 mg/dL (ref 100–199)
HDL: 39 mg/dL — ABNORMAL LOW (ref >39)
LDL Chol Calc (NIH): 89 mg/dL (ref 0–99)
Triglycerides: 106 mg/dL (ref 0–149)
VLDL Cholesterol Cal: 20 mg/dL (ref 5–40)

## 2020-10-07 LAB — PSA TOTAL (REFLEX TO FREE): Prostate Specific Ag, Serum: 0.6 ng/mL (ref 0.0–4.0)

## 2020-10-16 DIAGNOSIS — H26492 Other secondary cataract, left eye: Secondary | ICD-10-CM | POA: Diagnosis not present

## 2020-11-02 DIAGNOSIS — I2699 Other pulmonary embolism without acute cor pulmonale: Secondary | ICD-10-CM | POA: Diagnosis not present

## 2020-11-02 DIAGNOSIS — I10 Essential (primary) hypertension: Secondary | ICD-10-CM | POA: Diagnosis not present

## 2020-11-02 DIAGNOSIS — D45 Polycythemia vera: Secondary | ICD-10-CM | POA: Diagnosis not present

## 2020-11-02 DIAGNOSIS — I25119 Atherosclerotic heart disease of native coronary artery with unspecified angina pectoris: Secondary | ICD-10-CM | POA: Diagnosis not present

## 2020-11-03 ENCOUNTER — Other Ambulatory Visit: Payer: Self-pay

## 2020-11-03 ENCOUNTER — Encounter: Payer: Self-pay | Admitting: Oncology

## 2020-11-03 ENCOUNTER — Inpatient Hospital Stay: Payer: Medicare Other

## 2020-11-03 ENCOUNTER — Inpatient Hospital Stay: Payer: Medicare Other | Admitting: Oncology

## 2020-11-03 ENCOUNTER — Inpatient Hospital Stay: Payer: Medicare Other | Attending: Oncology

## 2020-11-03 VITALS — BP 147/85 | HR 70 | Temp 99.5°F | Resp 18 | Wt 229.2 lb

## 2020-11-03 DIAGNOSIS — D45 Polycythemia vera: Secondary | ICD-10-CM | POA: Insufficient documentation

## 2020-11-03 DIAGNOSIS — Z7952 Long term (current) use of systemic steroids: Secondary | ICD-10-CM | POA: Insufficient documentation

## 2020-11-03 DIAGNOSIS — Z86711 Personal history of pulmonary embolism: Secondary | ICD-10-CM | POA: Diagnosis not present

## 2020-11-03 DIAGNOSIS — Z1589 Genetic susceptibility to other disease: Secondary | ICD-10-CM

## 2020-11-03 DIAGNOSIS — I10 Essential (primary) hypertension: Secondary | ICD-10-CM | POA: Diagnosis not present

## 2020-11-03 DIAGNOSIS — Z79899 Other long term (current) drug therapy: Secondary | ICD-10-CM | POA: Diagnosis not present

## 2020-11-03 DIAGNOSIS — D72820 Lymphocytosis (symptomatic): Secondary | ICD-10-CM | POA: Insufficient documentation

## 2020-11-03 DIAGNOSIS — Z7982 Long term (current) use of aspirin: Secondary | ICD-10-CM | POA: Diagnosis not present

## 2020-11-03 DIAGNOSIS — Z87891 Personal history of nicotine dependence: Secondary | ICD-10-CM | POA: Diagnosis not present

## 2020-11-03 DIAGNOSIS — Z7901 Long term (current) use of anticoagulants: Secondary | ICD-10-CM | POA: Insufficient documentation

## 2020-11-03 LAB — COMPREHENSIVE METABOLIC PANEL
ALT: 12 U/L (ref 0–44)
AST: 14 U/L — ABNORMAL LOW (ref 15–41)
Albumin: 4 g/dL (ref 3.5–5.0)
Alkaline Phosphatase: 32 U/L — ABNORMAL LOW (ref 38–126)
Anion gap: 6 (ref 5–15)
BUN: 11 mg/dL (ref 8–23)
CO2: 29 mmol/L (ref 22–32)
Calcium: 8.5 mg/dL — ABNORMAL LOW (ref 8.9–10.3)
Chloride: 100 mmol/L (ref 98–111)
Creatinine, Ser: 1.03 mg/dL (ref 0.61–1.24)
GFR, Estimated: >60 mL/min (ref >60)
Glucose, Bld: 93 mg/dL (ref 70–99)
Potassium: 4.5 mmol/L (ref 3.5–5.1)
Sodium: 135 mmol/L (ref 135–145)
Total Bilirubin: 1.3 mg/dL — ABNORMAL HIGH (ref 0.3–1.2)
Total Protein: 6.3 g/dL — ABNORMAL LOW (ref 6.5–8.1)

## 2020-11-03 LAB — CBC WITH DIFFERENTIAL/PLATELET
Abs Immature Granulocytes: 0.22 10*3/uL — ABNORMAL HIGH (ref 0.00–0.07)
Basophils Absolute: 0.1 10*3/uL (ref 0.0–0.1)
Basophils Relative: 1 %
Eosinophils Absolute: 0.1 10*3/uL (ref 0.0–0.5)
Eosinophils Relative: 1 %
HCT: 44.3 % (ref 39.0–52.0)
Hemoglobin: 15 g/dL (ref 13.0–17.0)
Immature Granulocytes: 2 %
Lymphocytes Relative: 30 %
Lymphs Abs: 2.8 10*3/uL (ref 0.7–4.0)
MCH: 38.3 pg — ABNORMAL HIGH (ref 26.0–34.0)
MCHC: 33.9 g/dL (ref 30.0–36.0)
MCV: 113 fL — ABNORMAL HIGH (ref 80.0–100.0)
Monocytes Absolute: 0.7 10*3/uL (ref 0.1–1.0)
Monocytes Relative: 7 %
Neutro Abs: 5.4 10*3/uL (ref 1.7–7.7)
Neutrophils Relative %: 59 %
Platelets: 200 10*3/uL (ref 150–400)
RBC: 3.92 MIL/uL — ABNORMAL LOW (ref 4.22–5.81)
RDW: 13.4 % (ref 11.5–15.5)
WBC: 9.2 10*3/uL (ref 4.0–10.5)
nRBC: 0 % (ref 0.0–0.2)

## 2020-11-03 MED ORDER — APIXABAN 2.5 MG PO TABS: 2.5000 mg | ORAL_TABLET | Freq: Two times a day (BID) | ORAL | 3 refills | Status: DC

## 2020-11-03 NOTE — Progress Notes (Signed)
Hematology/Oncology follow up note [facility] [phone] [phone]   Patient Care Team: Birdie Sons, MD as PCP - General (Family Medicine) Lorelee Cover., MD as Consulting Physician (Ophthalmology) Quintin Alto, MD as Referring Physician (Rheumatology) Dorene Ar, MD (Pain Medicine) Ubaldo Glassing Javier Docker, MD as Consulting Physician (Cardiology) Earlie Server, MD as Consulting Physician (Oncology)  REFERRING PROVIDER: Birdie Sons, MD  CHIEF COMPLAINTS/REASON FOR VISIT:  Follow-up for erythrocytosis  HISTORY OF PRESENTING ILLNESS:   Edward Mejia is a  75 y.o.  male with PMH including rheumatoid arthritis, peripheral vascular disease hypertension, Covid infection history,, was seen in consultation at the request of  Fisher, Kirstie Peri, MD  for evaluation of polycytosis Patient was accompanied by her wife who participated in providing medical history. February 2021, patient was hospitalized due to COVID-19 pneumonia, respiratory failure, treated with steroids, remdesivir, received bamlanivimab.  Hospitalization was also completed by [facility], bleeding was  thought to be secondary to hemorrhoids.  He was discharged on 04/07/2019 and presented back to emergency room on 04/12/2019 due to left lower extremity swelling, acute hypoxic respiratory failure.  Work-up showed acute bilateral pulmonary embolism, large left lower extremity DVT, thrombosis and IVC and left external iliac vein.  Patient was started on anticoagulation and transition to Eliquis Patient has been on Eliquis since March 2021.  Patient also takes aspirin.  Patient has history of CAD, follows up with Dr. Ubaldo Glassing.  Rheumatoid arthritis on Enbrel once a week.  Also on prednisone 7.5 mg daily. Patient has also been found to have erythrocytosis. 09/20/2019, CBC showed hemoglobin of 17.6, reviewed previous medical records, erythrocytosis can be traced back to at least 2017.  Patient also has  leukocytosis since March 2021.  Leukocytosis has trended down recently and CBC on 09/20/2019 showed a white count of 10.8, predominantly monocytosis.  Patient is a former smoker, 10-12-pack-year smoking history and he stopped smoking in 1984. Currently he has some chronic shortness of breath with exertion.  He has lost weight during his previous hospitalization and has gained weight since discharge.  Sometimes he feels sweaty.  Patient also reports intermittent dizziness/lightheaded episodes for the past couple of months.  He actually had an episode during today's visit and is spontaneously resolved after few minutes.  Denies any shortness of breath, chest pain, vision changes, focal deficits.  Patient has been referred to establish care with neurology for further evaluation.  #  psoriatic arthritis follows up with [facility] Dr. Abner Greenspan.on chronic prednisone.  # skin squamous cell carcinoma removal from his scalp. INTERVAL HISTORY Edward Mejia is a 75 y.o. male who has above history reviewed by me today presents for follow up visit for management of polycythemia vera Patient has been on Eliquis 2.5 mg twice daily.  No bleeding events. Patient takes hydroxyurea 1000 mg daily.  Tolerates well.  Denies any new complaints.   .Review of Systems  Constitutional:  Positive for fatigue. Negative for appetite change, chills, fever and unexpected weight change.  HENT:   Negative for hearing loss and voice change.   Eyes:  Negative for eye problems and icterus.  Respiratory:  Negative for chest tightness, cough and shortness of breath.   Cardiovascular:  Negative for chest pain and leg swelling.  Gastrointestinal:  Negative for abdominal distention, abdominal pain and blood in stool.  Endocrine: Negative for hot flashes.  Genitourinary:  Negative for difficulty urinating, dysuria and frequency.   Musculoskeletal:  Negative for arthralgias.  Skin:  Negative for itching and rash.  Neurological:   Negative for extremity weakness, light-headedness and numbness.  Hematological:  Negative for adenopathy. Does not bruise/bleed easily.  Psychiatric/Behavioral:  Negative for confusion.    MEDICAL HISTORY:  Past Medical History:  Diagnosis Date   Arthritis    Bilateral pulmonary embolism ([place]) 03/2019   in setting of Covid-19 pneumonia   Cancer ([place])    skin cancer   Cellulitis    Cellulitis    DVT (deep venous thrombosis) ([place]) 03/2019   in setting of Covid-19 pneumonia   Erythrocytosis 09/20/2019   H/O adenomatous polyp of colon 03/24/2015   HCAP (healthcare-associated pneumonia) 04/12/2019   History of brain disorder: history of amaurosis fugax 03/24/2015   History of pneumonia 04/04/2008   History of rheumatic fever 03/24/2015   DID Have Rheumatic Fever.     Hypertension    Left leg DVT ([place]) 04/12/2019   In setting of Covid-19 started anticoagulation 04-12-2019. Subsequently Dx PCV and long term NOAC per hematology    Peptic ulcer disease    Sepsis ([place]) 04/12/2019   secondary to Covid pneumonia    SURGICAL HISTORY: Past Surgical History:  Procedure Laterality Date   APPENDECTOMY     LEFT HEART CATH AND CORONARY ANGIOGRAPHY Left 02/06/2018   Procedure: LEFT HEART CATH AND CORONARY ANGIOGRAPHY;  Surgeon: Teodoro Spray, MD;  Location: [facility] [place];  Service: Cardiovascular;  Laterality: Left;   MANDIBLE FRACTURE SURGERY     fractured jaw   PARTIAL GASTRECTOMY     peptic ulcer disease    SOCIAL HISTORY: Social History   Socioeconomic History   Marital status: Married    Spouse name: Lovey Newcomer    Number of children: 4   Years of education: Not on file   Highest education level: 8th grade  Occupational History   Occupation: Disabled    Comment: due to Psoriatic Arthritis  Tobacco Use   Smoking status: Former    Types: Cigarettes    Quit date: 09/20/1982    Years since quitting: 38.1   Smokeless tobacco: Never   Tobacco comments:    quit over 40+ years ago   Vaping Use   Vaping Use: Never used  Substance and Sexual Activity   Alcohol use: No    Alcohol/week: 0.0 standard drinks   Drug use: No   Sexual activity: Not on file  Other Topics Concern   Not on file  Social History Narrative   Not on file   Social Determinants of Health   Financial Resource Strain: Not on file  Food Insecurity: Not on file  Transportation Needs: Not on file  Physical Activity: Not on file  Stress: Not on file  Social Connections: Not on file  Intimate Partner Violence: Not on file    FAMILY HISTORY: Family History  Problem Relation Age of Onset   Cancer Mother        lung cancer   Heart disease Father 97       heart attack   Cancer Brother        pancreatic cancer   Cancer Brother        pancreatic cancer    ALLERGIES:  is allergic to pravachol [pravastatin].  MEDICATIONS:  Current Outpatient Medications  Medication Sig Dispense Refill   acetaminophen (TYLENOL) 500 MG tablet Take 1,000 mg by mouth 3 (three) times daily as needed for mild pain or headache.      albuterol (VENTOLIN HFA) 108 (90 Base) MCG/ACT inhaler Inhale  2 puffs into the lungs every 6 (six) hours as needed for wheezing or shortness of breath.     apixaban (ELIQUIS) 2.5 MG TABS tablet Take 1 tablet (2.5 mg total) by mouth 2 (two) times daily. 60 tablet 3   aspirin EC 81 MG tablet Take 81 mg by mouth daily. Swallow whole.     augmented betamethasone dipropionate (DIPROLENE-AF) 0.05 % cream Apply topically 2 (two) times daily as needed.     etanercept (ENBREL) 50 MG/ML injection Inject 50 mg into the skin every Thursday.      gabapentin (NEURONTIN) 300 MG capsule Take 900 mg by mouth See admin instructions.      HYDROcodone-acetaminophen (NORCO) 10-325 MG tablet Take 1 tablet by mouth every 6 (six) hours as needed.     hydrocortisone 2.5 % cream Apply topically 2 (two) times daily. As needed 30 g 3   hydroxyurea (HYDREA) 500 MG capsule Take 2 capsules (1,000 mg total) by mouth  daily. 180 capsule 0   isosorbide mononitrate (IMDUR) 120 MG 24 hr tablet Take 120 mg by mouth daily.      predniSONE (DELTASONE) 5 MG tablet Take 7.5 mg by mouth every morning.     ranolazine (RANEXA) 500 MG 12 hr tablet Take 500 mg by mouth in the morning and at bedtime.     valsartan (DIOVAN) 160 MG tablet TAKE 1 TABLET(160 MG) BY MOUTH DAILY 90 tablet 0   nitroGLYCERIN (NITROSTAT) 0.4 MG SL tablet Place 0.4 mg under the tongue every 5 (five) minutes as needed for chest pain. (Patient not taking: Reported on 11/03/2020)     No current facility-administered medications for this visit.     PHYSICAL EXAMINATION: ECOG PERFORMANCE STATUS: 1 - Symptomatic but completely ambulatory Today's Vitals   11/03/20 1334  BP: (!) 147/85  Pulse: 70  Resp: 18  Temp: 99.5 F (37.5 C)  Weight: 229 lb 3.2 oz (104 kg)  PainSc: 5    Body mass index is 31.09 kg/m.  Marland Kitchen Physical Exam Constitutional:      General: He is not in acute distress. HENT:     Head: Normocephalic and atraumatic.  Eyes:     General: No scleral icterus. Cardiovascular:     Rate and Rhythm: Normal rate and regular rhythm.     Heart sounds: Normal heart sounds.  Pulmonary:     Effort: Pulmonary effort is normal. No respiratory distress.     Breath sounds: No wheezing.  Abdominal:     General: Bowel sounds are normal. There is no distension.     Palpations: Abdomen is soft.  Musculoskeletal:        General: No deformity. Normal range of motion.     Cervical back: Normal range of motion and neck supple.  Skin:    General: Skin is warm and dry.     Comments: Scalp skin lesion status post resection.  Neurological:     Mental Status: He is alert and oriented to person, place, and time. Mental status is at baseline.     Cranial Nerves: No cranial nerve deficit.     Coordination: Coordination normal.  Psychiatric:        Mood and Affect: Mood normal.    LABORATORY DATA:  I have reviewed the data as listed Lab Results   Component Value Date   WBC 9.2 11/03/2020   HGB 15.0 11/03/2020   HCT 44.3 11/03/2020   MCV 113.0 (H) 11/03/2020   PLT 200 11/03/2020   Recent Labs  12/16/19 1404 12/27/19 0922 06/22/20 1241 07/23/20 1059 09/22/20 1244 11/03/20 1248  NA  --    < > 129* 131* 137 135  K  --    < > 4.0 4.3 3.8 4.5  CL  --    < > 95* 95* 101 100  CO2  --    < > 26 28 30 29   GLUCOSE  --    < > 177* 143* 125* 93  BUN  --    < > 17 16 9 11   CREATININE  --    < > 1.19 1.10 0.95 1.03  CALCIUM  --    < > 8.4* 8.5* 8.7* 8.5*  GFRNONAA  --    < > >60 >60 >60 >60  PROT 6.4*   < > 6.4* 6.1* 6.1* 6.3*  ALBUMIN 4.0   < > 4.0 3.8 3.9 4.0  AST 16   < > 22 18 14* 14*  ALT 16   < > 26 21 13 12   ALKPHOS 35*   < > 31* 33* 31* 32*  BILITOT 1.5*   < > 1.6* 1.7* 1.9* 1.3*  BILIDIR 0.3*  --  0.2  --   --   --   IBILI 1.2*  --   --   --   --   --    < > = values in this interval not displayed.    Iron/TIBC/Ferritin/ %Sat    Component Value Date/Time   IRON 49 09/20/2019 1201   TIBC 318 09/20/2019 1201   FERRITIN 24 09/20/2019 1201   IRONPCTSAT 15 (L) 09/20/2019 1201      RADIOGRAPHIC STUDIES: I have personally reviewed the radiological images as listed and agreed with the findings in the report. No results found.    ASSESSMENT & PLAN:  1. Polycythemia vera ([place])   2. JAK2 V617F mutation   3. History of pulmonary embolism   4. Monoclonal B-cell lymphocytosis of undetermined significance   5. Hypocalcemia    #Polycythemia vera,  JAK2 V617F mutation. Labs reviewed and discussed with patient Hemoglobin 15.  Hematocrit 44.3 No need for phlebotomy today Continue hydroxyurea 1000 mg daily.  He tolerates   .# History of VTE, continue Elqiuis 2.5mg  BID. -Patient also takes aspirin 81 mg for CAD.  He follows up with Dr. Ubaldo Glassing. # monoclonal lymphocytosis of unknown significance.  observation.   #Hypocalcemia, recommend patient to start taking calcium supplementation.  [phone] mg daily.   Orders  Placed This Encounter  Procedures   Comprehensive metabolic panel    Standing Status:   Future    Standing Expiration Date:   11/03/2021   CBC with Differential/Platelet    Standing Status:   Future    Standing Expiration Date:   11/03/2021   CBC with Differential/Platelet    Standing Status:   Future    Standing Expiration Date:   11/03/2021   Comprehensive metabolic panel    Standing Status:   Future    Standing Expiration Date:   11/03/2021    All questions were answered. The patient knows to call the clinic with any problems questions or concerns.  cc Birdie Sons, MD  Follow-up  Lab  CBC CMP in  6 weeks, lab MD in 12 weeks  Earlie Server, MD, PhD 11/03/2020

## 2020-11-03 NOTE — Progress Notes (Signed)
Pt here for follow up. No new concerns voiced.   

## 2020-11-10 DIAGNOSIS — M5136 Other intervertebral disc degeneration, lumbar region: Secondary | ICD-10-CM | POA: Diagnosis not present

## 2020-11-10 DIAGNOSIS — M48062 Spinal stenosis, lumbar region with neurogenic claudication: Secondary | ICD-10-CM | POA: Diagnosis not present

## 2020-11-10 DIAGNOSIS — G8929 Other chronic pain: Secondary | ICD-10-CM | POA: Diagnosis not present

## 2020-11-10 DIAGNOSIS — M25511 Pain in right shoulder: Secondary | ICD-10-CM | POA: Diagnosis not present

## 2020-11-17 DIAGNOSIS — G8929 Other chronic pain: Secondary | ICD-10-CM | POA: Diagnosis not present

## 2020-11-17 DIAGNOSIS — M19011 Primary osteoarthritis, right shoulder: Secondary | ICD-10-CM | POA: Diagnosis not present

## 2020-11-17 DIAGNOSIS — M25512 Pain in left shoulder: Secondary | ICD-10-CM | POA: Diagnosis not present

## 2020-11-17 DIAGNOSIS — M19012 Primary osteoarthritis, left shoulder: Secondary | ICD-10-CM | POA: Diagnosis not present

## 2020-11-17 DIAGNOSIS — M25511 Pain in right shoulder: Secondary | ICD-10-CM | POA: Diagnosis not present

## 2020-11-26 DIAGNOSIS — Z85828 Personal history of other malignant neoplasm of skin: Secondary | ICD-10-CM | POA: Diagnosis not present

## 2020-11-26 DIAGNOSIS — D692 Other nonthrombocytopenic purpura: Secondary | ICD-10-CM | POA: Diagnosis not present

## 2020-11-26 DIAGNOSIS — L570 Actinic keratosis: Secondary | ICD-10-CM | POA: Diagnosis not present

## 2020-11-26 DIAGNOSIS — D485 Neoplasm of uncertain behavior of skin: Secondary | ICD-10-CM | POA: Diagnosis not present

## 2020-11-26 DIAGNOSIS — L821 Other seborrheic keratosis: Secondary | ICD-10-CM | POA: Diagnosis not present

## 2020-12-10 ENCOUNTER — Other Ambulatory Visit: Payer: Self-pay | Admitting: Family Medicine

## 2020-12-10 DIAGNOSIS — I10 Essential (primary) hypertension: Secondary | ICD-10-CM

## 2020-12-10 NOTE — Telephone Encounter (Signed)
Requested Prescriptions  Pending Prescriptions Disp Refills  . valsartan (DIOVAN) 160 MG tablet [Pharmacy Med Name: VALSARTAN 160MG  TABLETS] 90 tablet 0    Sig: TAKE 1 TABLET(160 MG) BY MOUTH DAILY     Cardiovascular:  Angiotensin Receptor Blockers Failed - 12/10/2020  3:38 AM      Failed - Last BP in normal range    BP Readings from Last 1 Encounters:  11/03/20 (!) 147/85         Passed - Cr in normal range and within 180 days    Creatinine, Ser  Date Value Ref Range Status  11/03/2020 1.03 0.61 - 1.24 mg/dL Final   Creatinine, Urine  Date Value Ref Range Status  04/14/2019 106.78 mg/dL Final    Comment:    Performed at [facility], [place] [address] [place], [place] [address]         Passed - K in normal range and within 180 days    Potassium  Date Value Ref Range Status  11/03/2020 4.5 3.5 - 5.1 mmol/L Final         Passed - Patient is not pregnant      Passed - Valid encounter within last 6 months    Recent Outpatient Visits          2 months ago Annual physical exam   [facility] Birdie Sons, MD   4 months ago Benign essential HTN   [facility] Birdie Sons, MD   1 year ago Acute deep vein thrombosis (DVT) of distal vein of left lower extremity [facility])   [facility] Birdie Sons, MD   1 year ago Oral herpes   [facility], Dionne Bucy, MD   1 year ago Acute deep vein thrombosis (DVT) of distal vein of left lower extremity [facility])   [facility], Dionne Bucy, MD

## 2020-12-15 ENCOUNTER — Inpatient Hospital Stay: Payer: Medicare Other | Attending: Oncology

## 2020-12-15 ENCOUNTER — Other Ambulatory Visit: Payer: Self-pay

## 2020-12-15 ENCOUNTER — Other Ambulatory Visit: Payer: Self-pay | Admitting: Family Medicine

## 2020-12-15 DIAGNOSIS — D45 Polycythemia vera: Secondary | ICD-10-CM | POA: Insufficient documentation

## 2020-12-15 DIAGNOSIS — K649 Unspecified hemorrhoids: Secondary | ICD-10-CM

## 2020-12-15 LAB — CBC WITH DIFFERENTIAL/PLATELET
Abs Immature Granulocytes: 0.25 10*3/uL — ABNORMAL HIGH (ref 0.00–0.07)
Basophils Absolute: 0.1 10*3/uL (ref 0.0–0.1)
Basophils Relative: 1 %
Eosinophils Absolute: 0.1 10*3/uL (ref 0.0–0.5)
Eosinophils Relative: 1 %
HCT: 47 % (ref 39.0–52.0)
Hemoglobin: 15.7 g/dL (ref 13.0–17.0)
Immature Granulocytes: 3 %
Lymphocytes Relative: 30 %
Lymphs Abs: 2.8 10*3/uL (ref 0.7–4.0)
MCH: 37.4 pg — ABNORMAL HIGH (ref 26.0–34.0)
MCHC: 33.4 g/dL (ref 30.0–36.0)
MCV: 111.9 fL — ABNORMAL HIGH (ref 80.0–100.0)
Monocytes Absolute: 0.9 10*3/uL (ref 0.1–1.0)
Monocytes Relative: 9 %
Neutro Abs: 5.2 10*3/uL (ref 1.7–7.7)
Neutrophils Relative %: 56 %
Platelets: 161 10*3/uL (ref 150–400)
RBC: 4.2 MIL/uL — ABNORMAL LOW (ref 4.22–5.81)
RDW: 15.2 % (ref 11.5–15.5)
WBC: 9.3 10*3/uL (ref 4.0–10.5)
nRBC: 0 % (ref 0.0–0.2)

## 2020-12-15 LAB — COMPREHENSIVE METABOLIC PANEL
ALT: 16 U/L (ref 0–44)
AST: 16 U/L (ref 15–41)
Albumin: 4.1 g/dL (ref 3.5–5.0)
Alkaline Phosphatase: 31 U/L — ABNORMAL LOW (ref 38–126)
Anion gap: 8 (ref 5–15)
BUN: 14 mg/dL (ref 8–23)
CO2: 28 mmol/L (ref 22–32)
Calcium: 8.8 mg/dL — ABNORMAL LOW (ref 8.9–10.3)
Chloride: 101 mmol/L (ref 98–111)
Creatinine, Ser: 1.42 mg/dL — ABNORMAL HIGH (ref 0.61–1.24)
GFR, Estimated: 52 mL/min — ABNORMAL LOW (ref >60)
Glucose, Bld: 117 mg/dL — ABNORMAL HIGH (ref 70–99)
Potassium: 4.4 mmol/L (ref 3.5–5.1)
Sodium: 137 mmol/L (ref 135–145)
Total Bilirubin: 1.4 mg/dL — ABNORMAL HIGH (ref 0.3–1.2)
Total Protein: 6.2 g/dL — ABNORMAL LOW (ref 6.5–8.1)

## 2020-12-17 ENCOUNTER — Telehealth: Payer: Self-pay

## 2020-12-17 NOTE — Telephone Encounter (Signed)
-----   Message from Earlie Server, MD sent at 12/16/2020 11:07 PM EST ----- Cr is elevated.  Please encourage him to increase oral hydration.

## 2020-12-17 NOTE — Telephone Encounter (Signed)
Pt informed via Mychart.

## 2020-12-29 ENCOUNTER — Other Ambulatory Visit: Payer: Self-pay | Admitting: *Deleted

## 2021-01-08 ENCOUNTER — Other Ambulatory Visit: Payer: Self-pay

## 2021-01-08 MED ORDER — APIXABAN 2.5 MG PO TABS: 2.5000 mg | ORAL_TABLET | Freq: Two times a day (BID) | ORAL | 3 refills | Status: DC

## 2021-01-10 ENCOUNTER — Other Ambulatory Visit: Payer: Self-pay | Admitting: Oncology

## 2021-01-12 ENCOUNTER — Encounter: Payer: Self-pay | Admitting: Oncology

## 2021-01-23 ENCOUNTER — Encounter (HOSPITAL_COMMUNITY): Payer: Self-pay | Admitting: *Deleted

## 2021-01-23 ENCOUNTER — Emergency Department (HOSPITAL_COMMUNITY)
Admission: EM | Admit: 2021-01-23 | Discharge: 2021-01-23 | Disposition: A | Discharge status: Home / Self Care | Payer: Medicare Other | Attending: Emergency Medicine | Admitting: Emergency Medicine

## 2021-01-23 ENCOUNTER — Other Ambulatory Visit: Payer: Self-pay

## 2021-01-23 ENCOUNTER — Emergency Department (HOSPITAL_COMMUNITY): Payer: Medicare Other

## 2021-01-23 DIAGNOSIS — Z8616 Personal history of COVID-19: Secondary | ICD-10-CM | POA: Insufficient documentation

## 2021-01-23 DIAGNOSIS — I10 Essential (primary) hypertension: Secondary | ICD-10-CM | POA: Insufficient documentation

## 2021-01-23 DIAGNOSIS — Z20822 Contact with and (suspected) exposure to covid-19: Secondary | ICD-10-CM | POA: Insufficient documentation

## 2021-01-23 DIAGNOSIS — Z7982 Long term (current) use of aspirin: Secondary | ICD-10-CM | POA: Diagnosis not present

## 2021-01-23 DIAGNOSIS — J1000 Influenza due to other identified influenza virus with unspecified type of pneumonia: Secondary | ICD-10-CM | POA: Insufficient documentation

## 2021-01-23 DIAGNOSIS — R509 Fever, unspecified: Secondary | ICD-10-CM | POA: Diagnosis not present

## 2021-01-23 DIAGNOSIS — Z87891 Personal history of nicotine dependence: Secondary | ICD-10-CM | POA: Insufficient documentation

## 2021-01-23 DIAGNOSIS — J189 Pneumonia, unspecified organism: Secondary | ICD-10-CM

## 2021-01-23 DIAGNOSIS — J1282 Pneumonia due to coronavirus disease 2019: Secondary | ICD-10-CM | POA: Diagnosis not present

## 2021-01-23 DIAGNOSIS — Z85828 Personal history of other malignant neoplasm of skin: Secondary | ICD-10-CM | POA: Diagnosis not present

## 2021-01-23 DIAGNOSIS — R059 Cough, unspecified: Secondary | ICD-10-CM | POA: Diagnosis not present

## 2021-01-23 DIAGNOSIS — U071 COVID-19: Secondary | ICD-10-CM | POA: Diagnosis not present

## 2021-01-23 DIAGNOSIS — R0602 Shortness of breath: Secondary | ICD-10-CM | POA: Diagnosis not present

## 2021-01-23 DIAGNOSIS — J111 Influenza due to unidentified influenza virus with other respiratory manifestations: Secondary | ICD-10-CM

## 2021-01-23 LAB — RESP PANEL BY RT-PCR (FLU A&B, COVID) ARPGX2
Influenza A by PCR: POSITIVE — AB
Influenza B by PCR: NEGATIVE
SARS Coronavirus 2 by RT PCR: NEGATIVE

## 2021-01-23 LAB — URINALYSIS, ROUTINE W REFLEX MICROSCOPIC
Bilirubin Urine: NEGATIVE
Glucose, UA: NEGATIVE mg/dL
Hgb urine dipstick: NEGATIVE
Ketones, ur: 20 mg/dL — AB
Leukocytes,Ua: NEGATIVE
Nitrite: NEGATIVE
Protein, ur: NEGATIVE mg/dL
Specific Gravity, Urine: 1.013 (ref 1.005–1.030)
pH: 6 (ref 5.0–8.0)

## 2021-01-23 LAB — CBC WITH DIFFERENTIAL/PLATELET
Abs Immature Granulocytes: 0.23 10*3/uL — ABNORMAL HIGH (ref 0.00–0.07)
Basophils Absolute: 0.1 10*3/uL (ref 0.0–0.1)
Basophils Relative: 1 %
Eosinophils Absolute: 0.1 10*3/uL (ref 0.0–0.5)
Eosinophils Relative: 1 %
HCT: 44.5 % (ref 39.0–52.0)
Hemoglobin: 14.9 g/dL (ref 13.0–17.0)
Immature Granulocytes: 2 %
Lymphocytes Relative: 16 %
Lymphs Abs: 1.6 10*3/uL (ref 0.7–4.0)
MCH: 37.5 pg — ABNORMAL HIGH (ref 26.0–34.0)
MCHC: 33.5 g/dL (ref 30.0–36.0)
MCV: 112.1 fL — ABNORMAL HIGH (ref 80.0–100.0)
Monocytes Absolute: 0.9 10*3/uL (ref 0.1–1.0)
Monocytes Relative: 9 %
Neutro Abs: 7 10*3/uL (ref 1.7–7.7)
Neutrophils Relative %: 71 %
Platelets: 151 10*3/uL (ref 150–400)
RBC: 3.97 MIL/uL — ABNORMAL LOW (ref 4.22–5.81)
RDW: 15.1 % (ref 11.5–15.5)
WBC: 9.7 10*3/uL (ref 4.0–10.5)
nRBC: 0 % (ref 0.0–0.2)

## 2021-01-23 LAB — COMPREHENSIVE METABOLIC PANEL
ALT: 11 U/L (ref 0–44)
AST: 13 U/L — ABNORMAL LOW (ref 15–41)
Albumin: 3.5 g/dL (ref 3.5–5.0)
Alkaline Phosphatase: 32 U/L — ABNORMAL LOW (ref 38–126)
Anion gap: 9 (ref 5–15)
BUN: 10 mg/dL (ref 8–23)
CO2: 27 mmol/L (ref 22–32)
Calcium: 8.3 mg/dL — ABNORMAL LOW (ref 8.9–10.3)
Chloride: 98 mmol/L (ref 98–111)
Creatinine, Ser: 1.08 mg/dL (ref 0.61–1.24)
GFR, Estimated: >60 mL/min (ref >60)
Glucose, Bld: 108 mg/dL — ABNORMAL HIGH (ref 70–99)
Potassium: 3.9 mmol/L (ref 3.5–5.1)
Sodium: 134 mmol/L — ABNORMAL LOW (ref 135–145)
Total Bilirubin: 1.6 mg/dL — ABNORMAL HIGH (ref 0.3–1.2)
Total Protein: 6 g/dL — ABNORMAL LOW (ref 6.5–8.1)

## 2021-01-23 MED ORDER — AMOXICILLIN-POT CLAVULANATE 875-125 MG PO TABS: 1.0000 | ORAL_TABLET | Freq: Two times a day (BID) | ORAL | 0 refills | Status: DC

## 2021-01-23 MED ORDER — SODIUM CHLORIDE 0.9 % IV BOLUS
500.0000 mL | Freq: Once | INTRAVENOUS | Status: AC
  Administered 2021-01-23: 500 mL via INTRAVENOUS

## 2021-01-23 MED ORDER — AMOXICILLIN-POT CLAVULANATE 875-125 MG PO TABS
1.0000 | ORAL_TABLET | Freq: Once | ORAL | Status: AC
  Administered 2021-01-23: 1 via ORAL
  Filled 2021-01-23: qty 1

## 2021-01-23 NOTE — ED Notes (Signed)
Patient transported to X-ray 

## 2021-01-23 NOTE — ED Triage Notes (Signed)
Shortness of breath x 1 week, fever with productive cough, oral ulcers

## 2021-01-23 NOTE — ED Provider Notes (Signed)
[facility] EMERGENCY DEPARTMENT Provider Note   CSN: [id number] Arrival date & time: 01/23/21  1551     History Chief Complaint  Patient presents with   Shortness of Breath    Edward Mejia is a 75 y.o. male.  HPI He is here for evaluation of shortness of breath with fever and cough.  He also complains of "ulcers," in his mouth.  He has a history of polycythemia vera, pulmonary embolism, coronary artery disease, hypertension, psoriatic arthritis, hypertension, peripheral vascular disease, long-term use of anticoagulants.  He has not been able to eat or drink anything today and has been "gagging."  He did not take any of his medicines today except 3 Tylenol, earlier.  He has had full COVID and influenza vaccines.  He had a COVID infection about a year ago.  His wife is also sick with a cough.  There are no other known active modifying factors    Past Medical History:  Diagnosis Date   Arthritis    Bilateral pulmonary embolism ([place]) 03/2019   in setting of Covid-19 pneumonia   Cancer ([place])    skin cancer   Cellulitis    Cellulitis    DVT (deep venous thrombosis) ([place]) 03/2019   in setting of Covid-19 pneumonia   Erythrocytosis 09/20/2019   H/O adenomatous polyp of colon 03/24/2015   HCAP (healthcare-associated pneumonia) 04/12/2019   History of brain disorder: history of amaurosis fugax 03/24/2015   History of pneumonia 04/04/2008   History of rheumatic fever 03/24/2015   DID Have Rheumatic Fever.     Hypertension    Left leg DVT ([place]) 04/12/2019   In setting of Covid-19 started anticoagulation 04-12-2019. Subsequently Dx PCV and long term NOAC per hematology    Peptic ulcer disease    Sepsis ([place]) 04/12/2019   secondary to Covid pneumonia    Patient Active Problem List   Diagnosis Date Noted   Encounter for antineoplastic chemotherapy 09/23/2020   History of pulmonary embolism 11/29/2019   Post-thrombotic syndrome 11/29/2019   Monoclonal B-cell lymphocytosis of  undetermined significance 11/29/2019   Current use of long term anticoagulation 10/30/2019   JAK2 V617F mutation 10/04/2019   Polycythemia vera ([place]) 10/04/2019   Goals of care, counseling/discussion 10/04/2019   Erythrocytosis 09/20/2019   History of 2019 novel coronavirus disease (COVID-19) 04/03/2019   PVD (peripheral vascular disease) ([place]) 04/03/2019   Coronary artery disease 02/01/2018   Recurrent cellulitis of lower extremity 04/04/2017   Benign essential HTN 05/18/2016   Arthritis 03/24/2015   History of GI bleed 03/24/2015   History of brain disorder: history of amaurosis fugax 03/24/2015   H/O adenomatous polyp of colon 03/24/2015   Immunosuppressed status ([place]) 03/24/2015   Psoriatic arthritis ([place]) 08/10/2011   Herpes 03/26/2007   Dyslipidemia 11/25/2005   Hypercholesteremia 11/23/2005    Past Surgical History:  Procedure Laterality Date   APPENDECTOMY     LEFT HEART CATH AND CORONARY ANGIOGRAPHY Left 02/06/2018   Procedure: LEFT HEART CATH AND CORONARY ANGIOGRAPHY;  Surgeon: Teodoro Spray, MD;  Location: [place] [facility];  Service: Cardiovascular;  Laterality: Left;   MANDIBLE FRACTURE SURGERY     fractured jaw   PARTIAL GASTRECTOMY     peptic ulcer disease       Family History  Problem Relation Age of Onset   Cancer Mother        lung cancer   Heart disease Father 37       heart attack   Cancer Brother  pancreatic cancer   Cancer Brother        pancreatic cancer    Social History   Tobacco Use   Smoking status: Former    Types: Cigarettes    Quit date: 09/20/1982    Years since quitting: 38.3   Smokeless tobacco: Never   Tobacco comments:    quit over 40+ years ago  Vaping Use   Vaping Use: Never used  Substance Use Topics   Alcohol use: No    Alcohol/week: 0.0 standard drinks   Drug use: No    Home Medications Prior to Admission medications   Medication Sig Start Date End Date Taking? Authorizing Provider  acetaminophen  (TYLENOL) 500 MG tablet Take 1,000 mg by mouth 3 (three) times daily as needed for mild pain or headache.    Yes [provider]  albuterol (VENTOLIN HFA) 108 (90 Base) MCG/ACT inhaler Inhale 2 puffs into the lungs every 6 (six) hours as needed for wheezing or shortness of breath.   Yes [provider]  amoxicillin-clavulanate (AUGMENTIN) 875-125 MG tablet Take 1 tablet by mouth 2 (two) times daily. One po bid x 7 days 01/23/21  Yes Daleen Bo, MD  apixaban (ELIQUIS) 2.5 MG TABS tablet Take 1 tablet (2.5 mg total) by mouth 2 (two) times daily. 01/08/21  Yes Earlie Server, MD  aspirin EC 81 MG tablet Take 81 mg by mouth daily. Swallow whole.   Yes [provider]  augmented betamethasone dipropionate (DIPROLENE-AF) 0.05 % cream Apply topically 2 (two) times daily as needed. 06/01/20  Yes [provider]  etanercept (ENBREL) 50 MG/ML injection Inject 50 mg into the skin every Thursday.    Yes [provider]  gabapentin (NEURONTIN) 300 MG capsule Take 900 mg by mouth See admin instructions. At 4 pm 10/02/18  Yes [provider]  HYDROcodone-acetaminophen (NORCO) 10-325 MG tablet Take 1 tablet by mouth every 6 (six) hours as needed. 09/28/20  Yes [provider]  hydrocortisone 2.5 % cream APPLY TOPICALLY TO THE AFFECTED AREA TWICE DAILY AS NEEDED 12/15/20  Yes Fisher, Kirstie Peri, MD  hydroxyurea (HYDREA) 500 MG capsule TAKE 2 CAPSULES(1000 MG) BY MOUTH DAILY Patient taking differently: Take by mouth See admin instructions. 100 mg daily 01/12/21  Yes Earlie Server, MD  isosorbide mononitrate (IMDUR) 120 MG 24 hr tablet Take 120 mg by mouth daily.  09/24/18  Yes [provider]  nitroGLYCERIN (NITROSTAT) 0.4 MG SL tablet Place 0.4 mg under the tongue every 5 (five) minutes as needed for chest pain.   Yes [provider]  predniSONE (DELTASONE) 5 MG tablet Take 7.5 mg by mouth every morning. 11/09/19  Yes [provider]  ranolazine  (RANEXA) 500 MG 12 hr tablet Take 500 mg by mouth in the morning and at bedtime. 03/18/19  Yes [provider]  valsartan (DIOVAN) 160 MG tablet TAKE 1 TABLET(160 MG) BY MOUTH DAILY Patient taking differently: Take 160 mg by mouth daily. 12/10/20  Yes Birdie Sons, MD  triamcinolone acetonide (KENALOG-40) 40 MG/ML injection (RADIOLOGY ONLY) Inject into the articular space. 11/17/20   [provider]    Allergies    Pravachol [pravastatin]  Review of Systems   Review of Systems  All other systems reviewed and are negative.  Physical Exam Updated Vital Signs BP 139/69    Pulse 81    Temp 97.6 F (36.4 C) (Oral)    Resp 15    Ht 6' (1.829 m)    Wt 99.3  kg    SpO2 96%    BMI 29.70 kg/m   Physical Exam Vitals and nursing note reviewed.  Constitutional:      General: He is not in acute distress.    Appearance: He is well-developed. He is not ill-appearing, toxic-appearing or diaphoretic.  HENT:     Head: Normocephalic and atraumatic.     Right Ear: External ear normal.     Left Ear: External ear normal.     Mouth/Throat:     Mouth: Mucous membranes are moist.  Eyes:     Conjunctiva/sclera: Conjunctivae normal.     Pupils: Pupils are equal, round, and reactive to light.  Neck:     Trachea: Phonation normal.  Cardiovascular:     Rate and Rhythm: Normal rate and regular rhythm.     Heart sounds: Normal heart sounds.  Pulmonary:     Effort: Pulmonary effort is normal. No respiratory distress.     Breath sounds: No stridor. Rhonchi present. No wheezing.  Chest:     Chest wall: No tenderness.  Abdominal:     General: There is no distension.     Palpations: Abdomen is soft.     Tenderness: There is no abdominal tenderness.  Musculoskeletal:        General: Normal range of motion.     Cervical back: Normal range of motion and neck supple.     Right lower leg: No edema.     Left lower leg: No edema.  Skin:    General: Skin is warm and dry.  Neurological:      Mental Status: He is alert and oriented to person, place, and time.     Cranial Nerves: No cranial nerve deficit.     Sensory: No sensory deficit.     Motor: No abnormal muscle tone.     Coordination: Coordination normal.  Psychiatric:        Mood and Affect: Mood normal.        Behavior: Behavior normal.        Thought Content: Thought content normal.        Judgment: Judgment normal.    ED Results / Procedures / Treatments   Labs (all labs ordered are listed, but only abnormal results are displayed) Labs Reviewed  RESP PANEL BY RT-PCR (FLU A&B, COVID) ARPGX2 - Abnormal; Notable for the following components:      Result Value   Influenza A by PCR POSITIVE (*)    All other components within normal limits  COMPREHENSIVE METABOLIC PANEL - Abnormal; Notable for the following components:   Sodium 134 (*)    Glucose, Bld 108 (*)    Calcium 8.3 (*)    Total Protein 6.0 (*)    AST 13 (*)    Alkaline Phosphatase 32 (*)    Total Bilirubin 1.6 (*)    All other components within normal limits  CBC WITH DIFFERENTIAL/PLATELET - Abnormal; Notable for the following components:   RBC 3.97 (*)    MCV 112.1 (*)    MCH 37.5 (*)    Abs Immature Granulocytes 0.23 (*)    All other components within normal limits  URINALYSIS, ROUTINE W REFLEX MICROSCOPIC - Abnormal; Notable for the following components:   Ketones, ur 20 (*)    All other components within normal limits    EKG EKG Interpretation  Date/Time:  Saturday January 23 2021 16:13:22 EST Ventricular Rate:  82 PR Interval:  153 QRS Duration: 92 QT Interval:  380 QTC Calculation:  444 R Axis:     Text Interpretation: Normal sinus rhythm Nonspecific T wave abnormality, worse in Inferior leads compared to prior Otherwise no significant change Confirmed by Daleen Bo [phone]) on 01/23/2021 4:18:39 PM  Radiology DG Chest 2 View  Result Date: 01/23/2021 CLINICAL DATA:  Cough, fever EXAM: CHEST - 2 VIEW COMPARISON:  Radiograph  04/26/2019 FINDINGS: Unchanged cardiomediastinal silhouette. Focal airspace opacity in the right mid lung. Scarring in the right upper lung and left peripheral mid lung. These opacities are in the region of prior pneumonia no visible pneumothorax. No acute osseous abnormality. IMPRESSION: Airspace opacity in the right mid lung, concerning for pneumonia. This could potentially be chronic postinflammatory change from prior pneumonia (this is in a similar location to prior infectious process in 2021). Recommend follow-up PA and lateral chest radiograph in 6-12 weeks. Electronically Signed   By: Maurine Simmering M.D.   On: 01/23/2021 17:29    Procedures Procedures   Medications Ordered in ED Medications  amoxicillin-clavulanate (AUGMENTIN) 875-125 MG per tablet 1 tablet (has no administration in time range)  sodium chloride 0.9 % bolus 500 mL (0 mLs Intravenous Stopped 01/23/21 1756)    ED Course  I have reviewed the triage vital signs and the nursing notes.  Pertinent labs & imaging results that were available during my care of the patient were reviewed by me and considered in my medical decision making (see chart for details).    MDM Rules/Calculators/A&P                          Patient Vitals for the past 24 hrs:  BP Temp Temp src Pulse Resp SpO2 Height Weight  01/23/21 1930 139/69 -- -- 81 15 96 % -- --  01/23/21 1900 138/74 -- -- 83 (!) 21 98 % -- --  01/23/21 1830 138/76 -- -- 79 17 95 % -- --  01/23/21 1806 138/68 -- -- 78 17 97 % -- --  01/23/21 1730 135/69 -- -- 71 (!) 27 96 % -- --  01/23/21 1630 125/65 -- -- 80 (!) 24 97 % -- --  01/23/21 1628 -- -- -- -- -- 97 % -- --  01/23/21 1627 -- -- -- -- -- -- 6' (1.829 m) 99.3 kg  01/23/21 1604 121/72 97.6 F (36.4 C) Oral 90 20 98 % -- --    7:32 PM Reevaluation with update and discussion. After initial assessment and treatment, an updated evaluation reveals no further complaints, findings discussed with patient and wife, all questions  answered. Daleen Bo   Medical Decision Making:  This patient is presenting for evaluation of weakness, malaise, shortness of breath and cough, which does require a range of treatment options, and is a complaint that involves a high risk of morbidity and mortality. The differential diagnoses include acute bronchitis, pneumonia, congestive heart failure, viral illness, exacerbation of chronic disease. I decided to review old records, and in summary elderly male with multiple medical problems including cardiac disorder, immunosuppression, polycythemia vera, and history of B-cell lymphocytosis.  I obtained additional historical information from wife at bedside.  Clinical Laboratory Tests Ordered, included CBC, Metabolic panel, Urinalysis, and viral panel . Review indicates normal except MCV high, influenza A positive, sodium low, glucose high, calcium low, total protein low, AST low, alkaline phosphatase low, total bilirubin high. Radiologic Tests Ordered, included chest x-ray.  I independently Visualized: Radiography images, which show possible right midlung pneumonia.  Cardiac Monitor Tracing which shows normal  sinus rhythm    Critical Interventions-clinical evaluation, laboratory testing, radiography, IV fluids, observation and reassessment  After These Interventions, the Patient was reevaluated and was found with influenza and possible bacterial pneumonia.  Patient is coughing and producing green sputum.  He will be covered with a course of Augmentin for possible community-acquired pneumonia.  This is also possibly related to a prior pneumonia from 2021.  Doubt sepsis, or impending vascular collapse.  Patient does not require hospitalization at this time.  There is no indication for starting Tamiflu because he has been ill for 7 days, and it is unlikely to be helpful at this time.  Patient started on dose of Augmentin prior to discharge.  CRITICAL CARE-no Performed by: Daleen Bo  Nursing Notes Reviewed/ Care Coordinated Applicable Imaging Reviewed Interpretation of Laboratory Data incorporated into ED treatment  The patient appears reasonably screened and/or stabilized for discharge and I doubt any other medical condition or other [facility] requiring further screening, evaluation, or treatment in the ED at this time prior to discharge.  Plan: Home Medications-continue usual; Home Treatments-gradually advance diet and activity; return here if the recommended treatment, does not improve the symptoms; Recommended follow up-PCP follow-up if not better in a few days.        Final Clinical Impression(s) / ED Diagnoses Final diagnoses:  Community acquired pneumonia of right middle lobe of lung  Influenza    Rx / DC Orders ED Discharge Orders          Ordered    amoxicillin-clavulanate (AUGMENTIN) 875-125 MG tablet  2 times daily        01/23/21 1941             Daleen Bo, MD 01/23/21 1947

## 2021-01-23 NOTE — ED Notes (Signed)
Per spouse patient has had increasing weakness over the last week, states he has covid last year and it affected his short term memory

## 2021-01-23 NOTE — Discharge Instructions (Signed)
Make sure you are getting plenty of rest and drink a lot of fluids.  We sent a prescription for an antibiotic to your pharmacy.  Start taking it tomorrow morning.

## 2021-02-02 ENCOUNTER — Inpatient Hospital Stay: Payer: Medicare Other | Attending: Oncology

## 2021-02-02 ENCOUNTER — Encounter: Payer: Self-pay | Admitting: Oncology

## 2021-02-02 ENCOUNTER — Inpatient Hospital Stay: Payer: Medicare Other | Admitting: Oncology

## 2021-02-02 ENCOUNTER — Other Ambulatory Visit: Payer: Self-pay

## 2021-02-02 VITALS — BP 125/82 | HR 72 | Temp 97.8°F | Resp 18 | Wt 222.0 lb

## 2021-02-02 DIAGNOSIS — D45 Polycythemia vera: Secondary | ICD-10-CM | POA: Diagnosis not present

## 2021-02-02 DIAGNOSIS — D72820 Lymphocytosis (symptomatic): Secondary | ICD-10-CM

## 2021-02-02 DIAGNOSIS — Z86711 Personal history of pulmonary embolism: Secondary | ICD-10-CM

## 2021-02-02 DIAGNOSIS — Z7901 Long term (current) use of anticoagulants: Secondary | ICD-10-CM | POA: Diagnosis not present

## 2021-02-02 DIAGNOSIS — M069 Rheumatoid arthritis, unspecified: Secondary | ICD-10-CM | POA: Diagnosis not present

## 2021-02-02 LAB — COMPREHENSIVE METABOLIC PANEL
ALT: 9 U/L (ref 0–44)
AST: 18 U/L (ref 15–41)
Albumin: 3.7 g/dL (ref 3.5–5.0)
Alkaline Phosphatase: 31 U/L — ABNORMAL LOW (ref 38–126)
Anion gap: 7 (ref 5–15)
BUN: 10 mg/dL (ref 8–23)
CO2: 27 mmol/L (ref 22–32)
Calcium: 8.3 mg/dL — ABNORMAL LOW (ref 8.9–10.3)
Chloride: 97 mmol/L — ABNORMAL LOW (ref 98–111)
Creatinine, Ser: 1.04 mg/dL (ref 0.61–1.24)
GFR, Estimated: >60 mL/min (ref >60)
Glucose, Bld: 93 mg/dL (ref 70–99)
Potassium: 4.4 mmol/L (ref 3.5–5.1)
Sodium: 131 mmol/L — ABNORMAL LOW (ref 135–145)
Total Bilirubin: 1.1 mg/dL (ref 0.3–1.2)
Total Protein: 6 g/dL — ABNORMAL LOW (ref 6.5–8.1)

## 2021-02-02 LAB — CBC WITH DIFFERENTIAL/PLATELET
Abs Immature Granulocytes: 0.44 10*3/uL — ABNORMAL HIGH (ref 0.00–0.07)
Basophils Absolute: 0.1 10*3/uL (ref 0.0–0.1)
Basophils Relative: 1 %
Eosinophils Absolute: 0 10*3/uL (ref 0.0–0.5)
Eosinophils Relative: 0 %
HCT: 44.7 % (ref 39.0–52.0)
Hemoglobin: 14.8 g/dL (ref 13.0–17.0)
Immature Granulocytes: 4 %
Lymphocytes Relative: 25 %
Lymphs Abs: 2.5 10*3/uL (ref 0.7–4.0)
MCH: 37.8 pg — ABNORMAL HIGH (ref 26.0–34.0)
MCHC: 33.1 g/dL (ref 30.0–36.0)
MCV: 114 fL — ABNORMAL HIGH (ref 80.0–100.0)
Monocytes Absolute: 0.7 10*3/uL (ref 0.1–1.0)
Monocytes Relative: 7 %
Neutro Abs: 6.3 10*3/uL (ref 1.7–7.7)
Neutrophils Relative %: 63 %
Platelets: 338 10*3/uL (ref 150–400)
RBC: 3.92 MIL/uL — ABNORMAL LOW (ref 4.22–5.81)
RDW: 15.6 % — ABNORMAL HIGH (ref 11.5–15.5)
WBC: 10.1 10*3/uL (ref 4.0–10.5)
nRBC: 0 % (ref 0.0–0.2)

## 2021-02-02 NOTE — Progress Notes (Signed)
Hematology oncology progress note [phone] [phone]   Patient Care Team: Birdie Sons, MD as PCP - General (Family Medicine) Lorelee Cover., MD as Consulting Physician (Ophthalmology) Quintin Alto, MD as Referring Physician (Rheumatology) Dorene Ar, MD (Pain Medicine) Ubaldo Glassing Javier Docker, MD as Consulting Physician (Cardiology) Earlie Server, MD as Consulting Physician (Oncology)  REFERRING PROVIDER: Birdie Sons, MD  CHIEF COMPLAINTS/REASON FOR VISIT:  Follow-up for polycythemia vera-Jak [id number] mutation  HISTORY OF PRESENTING ILLNESS:   Edward Mejia is a  75 y.o.  male with PMH including rheumatoid arthritis, peripheral vascular disease hypertension, Covid infection history,, was seen in consultation at the request of  Fisher, Kirstie Peri, MD  for evaluation of polycytosis Patient was accompanied by her wife who participated in providing medical history. February 2021, patient was hospitalized due to COVID-19 pneumonia, respiratory failure, treated with steroids, remdesivir, received bamlanivimab.  Hospitalization was also completed by [facility], bleeding was  thought to be secondary to hemorrhoids.  He was discharged on 04/07/2019 and presented back to emergency room on 04/12/2019 due to left lower extremity swelling, acute hypoxic respiratory failure.  Work-up showed acute bilateral pulmonary embolism, large left lower extremity DVT, thrombosis and IVC and left external iliac vein.  Patient was started on anticoagulation and transition to Eliquis Patient has been on Eliquis since March 2021.  Patient also takes aspirin.  Patient has history of CAD, follows up with Dr. Ubaldo Glassing.  Rheumatoid arthritis on Enbrel once a week.  Also on prednisone 7.5 mg daily. Patient has also been found to have erythrocytosis. 09/20/2019, CBC showed hemoglobin of 17.6, reviewed previous medical records, erythrocytosis can be traced back to at least 2017.  Patient also has  leukocytosis since March 2021.  Leukocytosis has trended down recently and CBC on 09/20/2019 showed a white count of 10.8, predominantly monocytosis.  Patient is a former smoker, 10-12-pack-year smoking history and he stopped smoking in 1984. Currently he has some chronic shortness of breath with exertion.  He has lost weight during his previous hospitalization and has gained weight since discharge.  Sometimes he feels sweaty.  Patient also reports intermittent dizziness/lightheaded episodes for the past couple of months.  He actually had an episode during today's visit and is spontaneously resolved after few minutes.  Denies any shortness of breath, chest pain, vision changes, focal deficits.  Patient has been referred to establish care with neurology for further evaluation.  #  psoriatic arthritis follows up with [facility] Dr. Abner Greenspan.on chronic prednisone.  # skin squamous cell carcinoma removal from his scalp. INTERVAL HISTORY Edward Mejia is a 75 y.o. male who has above history reviewed by me today presents for follow up visit for management of polycythemia vera Patient has been on Eliquis 2.5 mg twice daily.  No bleeding events. Patient takes hydroxyurea 1000 mg daily.  Tolerates well. Both hands and his wife had influenza infection recently.  Still has some cough.  Otherwise doing well.  No fever, chills, sore throat, diarrhea, nausea vomiting    .Review of Systems  Constitutional:  Positive for fatigue. Negative for appetite change, chills, fever and unexpected weight change.  HENT:   Negative for hearing loss and voice change.   Eyes:  Negative for eye problems and icterus.  Respiratory:  Positive for cough. Negative for chest tightness and shortness of breath.   Cardiovascular:  Negative for chest pain and leg swelling.  Gastrointestinal:  Negative for abdominal distention, abdominal pain and blood in stool.  Endocrine:  Negative for hot flashes.  Genitourinary:  Negative for  difficulty urinating, dysuria and frequency.   Musculoskeletal:  Negative for arthralgias.  Skin:  Negative for itching and rash.  Neurological:  Negative for extremity weakness, light-headedness and numbness.  Hematological:  Negative for adenopathy. Does not bruise/bleed easily.  Psychiatric/Behavioral:  Negative for confusion.    MEDICAL HISTORY:  Past Medical History:  Diagnosis Date   Arthritis    Bilateral pulmonary embolism ([place]) 03/2019   in setting of Covid-19 pneumonia   Cancer ([place])    skin cancer   Cellulitis    Cellulitis    DVT (deep venous thrombosis) ([place]) 03/2019   in setting of Covid-19 pneumonia   Erythrocytosis 09/20/2019   H/O adenomatous polyp of colon 03/24/2015   HCAP (healthcare-associated pneumonia) 04/12/2019   History of brain disorder: history of amaurosis fugax 03/24/2015   History of pneumonia 04/04/2008   History of rheumatic fever 03/24/2015   DID Have Rheumatic Fever.     Hypertension    Left leg DVT ([place]) 04/12/2019   In setting of Covid-19 started anticoagulation 04-12-2019. Subsequently Dx PCV and long term NOAC per hematology    Peptic ulcer disease    Sepsis ([place]) 04/12/2019   secondary to Covid pneumonia    SURGICAL HISTORY: Past Surgical History:  Procedure Laterality Date   APPENDECTOMY     LEFT HEART CATH AND CORONARY ANGIOGRAPHY Left 02/06/2018   Procedure: LEFT HEART CATH AND CORONARY ANGIOGRAPHY;  Surgeon: Teodoro Spray, MD;  Location: [facility];  Service: Cardiovascular;  Laterality: Left;   MANDIBLE FRACTURE SURGERY     fractured jaw   PARTIAL GASTRECTOMY     peptic ulcer disease    SOCIAL HISTORY: Social History   Socioeconomic History   Marital status: Married    Spouse name: Lovey Newcomer    Number of children: 4   Years of education: Not on file   Highest education level: 8th grade  Occupational History   Occupation: Disabled    Comment: due to Psoriatic Arthritis  Tobacco Use   Smoking status: Former    Types:  Cigarettes    Quit date: 09/20/1982    Years since quitting: 38.3   Smokeless tobacco: Never   Tobacco comments:    quit over 40+ years ago  Vaping Use   Vaping Use: Never used  Substance and Sexual Activity   Alcohol use: No    Alcohol/week: 0.0 standard drinks   Drug use: No   Sexual activity: Not on file  Other Topics Concern   Not on file  Social History Narrative   Not on file   Social Determinants of Health   Financial Resource Strain: Not on file  Food Insecurity: Not on file  Transportation Needs: Not on file  Physical Activity: Not on file  Stress: Not on file  Social Connections: Not on file  Intimate Partner Violence: Not on file    FAMILY HISTORY: Family History  Problem Relation Age of Onset   Cancer Mother        lung cancer   Heart disease Father 75       heart attack   Cancer Brother        pancreatic cancer   Cancer Brother        pancreatic cancer    ALLERGIES:  is allergic to pravachol [pravastatin].  MEDICATIONS:  Current Outpatient Medications  Medication Sig Dispense Refill   acetaminophen (TYLENOL) 500 MG tablet Take 1,000 mg by mouth 3 (  three) times daily as needed for mild pain or headache.      albuterol (VENTOLIN HFA) 108 (90 Base) MCG/ACT inhaler Inhale 2 puffs into the lungs every 6 (six) hours as needed for wheezing or shortness of breath.     amoxicillin-clavulanate (AUGMENTIN) 875-125 MG tablet Take 1 tablet by mouth 2 (two) times daily. One po bid x 7 days (Patient not taking: Reported on 02/02/2021) 14 tablet 0   apixaban (ELIQUIS) 2.5 MG TABS tablet Take 1 tablet (2.5 mg total) by mouth 2 (two) times daily. 60 tablet 3   aspirin EC 81 MG tablet Take 81 mg by mouth daily. Swallow whole.     augmented betamethasone dipropionate (DIPROLENE-AF) 0.05 % cream Apply topically 2 (two) times daily as needed.     etanercept (ENBREL) 50 MG/ML injection Inject 50 mg into the skin every Thursday.      gabapentin (NEURONTIN) 300 MG capsule  Take 900 mg by mouth See admin instructions. At 4 pm     HYDROcodone-acetaminophen (NORCO) 10-325 MG tablet Take 1 tablet by mouth every 6 (six) hours as needed.     hydrocortisone 2.5 % cream APPLY TOPICALLY TO THE AFFECTED AREA TWICE DAILY AS NEEDED 30 g 3   hydroxyurea (HYDREA) 500 MG capsule TAKE 2 CAPSULES(1000 MG) BY MOUTH DAILY (Patient taking differently: Take by mouth See admin instructions. 100 mg daily) 180 capsule 0   isosorbide mononitrate (IMDUR) 120 MG 24 hr tablet Take 120 mg by mouth daily.      nitroGLYCERIN (NITROSTAT) 0.4 MG SL tablet Place 0.4 mg under the tongue every 5 (five) minutes as needed for chest pain.     predniSONE (DELTASONE) 5 MG tablet Take 7.5 mg by mouth every morning.     ranolazine (RANEXA) 500 MG 12 hr tablet Take 500 mg by mouth in the morning and at bedtime.     triamcinolone acetonide (KENALOG-40) 40 MG/ML injection (RADIOLOGY ONLY) Inject into the articular space.     valsartan (DIOVAN) 160 MG tablet TAKE 1 TABLET(160 MG) BY MOUTH DAILY (Patient taking differently: Take 160 mg by mouth daily.) 90 tablet 0   No current facility-administered medications for this visit.     PHYSICAL EXAMINATION: ECOG PERFORMANCE STATUS: 1 - Symptomatic but completely ambulatory Today's Vitals   02/02/21 1341 02/02/21 1346  BP:  125/82  Pulse:  72  Resp:  18  Temp:  97.8 F (36.6 C)  TempSrc:  Oral  SpO2:  100%  Weight:  222 lb (100.7 kg)  PainSc: 6     Body mass index is 30.11 kg/m.  Marland Kitchen Physical Exam Constitutional:      General: He is not in acute distress. HENT:     Head: Normocephalic and atraumatic.  Eyes:     General: No scleral icterus. Cardiovascular:     Rate and Rhythm: Normal rate and regular rhythm.     Heart sounds: Normal heart sounds.  Pulmonary:     Effort: Pulmonary effort is normal. No respiratory distress.     Breath sounds: No wheezing.  Abdominal:     General: Bowel sounds are normal. There is no distension.     Palpations:  Abdomen is soft.  Musculoskeletal:        General: No deformity. Normal range of motion.     Cervical back: Normal range of motion and neck supple.  Skin:    General: Skin is warm and dry.     Comments: Scalp skin lesion status post resection.  Neurological:     Mental Status: He is alert and oriented to person, place, and time. Mental status is at baseline.     Cranial Nerves: No cranial nerve deficit.     Coordination: Coordination normal.  Psychiatric:        Mood and Affect: Mood normal.    LABORATORY DATA:  I have reviewed the data as listed Lab Results  Component Value Date   WBC 10.1 02/02/2021   HGB 14.8 02/02/2021   HCT 44.7 02/02/2021   MCV 114.0 (H) 02/02/2021   PLT 338 02/02/2021   Recent Labs    06/22/20 1241 07/23/20 1059 12/15/20 1424 01/23/21 1630 02/02/21 1326  NA 129*   < > 137 134* 131*  K 4.0   < > 4.4 3.9 4.4  CL 95*   < > 101 98 97*  CO2 26   < > 28 27 27   GLUCOSE 177*   < > 117* 108* 93  BUN 17   < > 14 10 10   CREATININE 1.19   < > 1.42* 1.08 1.04  CALCIUM 8.4*   < > 8.8* 8.3* 8.3*  GFRNONAA >60   < > 52* >60 >60  PROT 6.4*   < > 6.2* 6.0* 6.0*  ALBUMIN 4.0   < > 4.1 3.5 3.7  AST 22   < > 16 13* 18  ALT 26   < > 16 11 9   ALKPHOS 31*   < > 31* 32* 31*  BILITOT 1.6*   < > 1.4* 1.6* 1.1  BILIDIR 0.2  --   --   --   --    < > = values in this interval not displayed.    Iron/TIBC/Ferritin/ %Sat    Component Value Date/Time   IRON 49 09/20/2019 1201   TIBC 318 09/20/2019 1201   FERRITIN 24 09/20/2019 1201   IRONPCTSAT 15 (L) 09/20/2019 1201      RADIOGRAPHIC STUDIES: I have personally reviewed the radiological images as listed and agreed with the findings in the report. DG Chest 2 View  Result Date: 01/23/2021 CLINICAL DATA:  Cough, fever EXAM: CHEST - 2 VIEW COMPARISON:  Radiograph 04/26/2019 FINDINGS: Unchanged cardiomediastinal silhouette. Focal airspace opacity in the right mid lung. Scarring in the right upper lung and left  peripheral mid lung. These opacities are in the region of prior pneumonia no visible pneumothorax. No acute osseous abnormality. IMPRESSION: Airspace opacity in the right mid lung, concerning for pneumonia. This could potentially be chronic postinflammatory change from prior pneumonia (this is in a similar location to prior infectious process in 2021). Recommend follow-up PA and lateral chest radiograph in 6-12 weeks. Electronically Signed   By: Maurine Simmering M.D.   On: 01/23/2021 17:29      ASSESSMENT & PLAN:  1. Polycythemia vera ([place])   2. Monoclonal B-cell lymphocytosis of undetermined significance   3. History of pulmonary embolism   4. Hypocalcemia    #Polycythemia vera,  JAK2 V617F mutation. Labs reviewed and discussed with patient And hemoglobin is stable at 14.8.  Hematocrit 44.7.  At goal. No need for phlebotomy. Continue hydroxyurea 1000 mg daily .   .# History of VTE, continue Elqiuis 2.5mg  BID. -Patient also takes aspirin 81 mg for CAD.  He follows up with Dr. Ubaldo Glassing. # monoclonal lymphocytosis of unknown significance.  observation.  White count has been stable. #Hypocalcemia, recommend patient continue to take calcium supplementation.  Recommend 1200 mg daily.  Recommend patient to also take empiric vitamin  D 1000 units daily.  Recommend patient to have vitamin D checked with primary care provider. Orders Placed This Encounter  Procedures   Comprehensive metabolic panel    Standing Status:   Future    Standing Expiration Date:   02/02/2022   CBC with Differential/Platelet    Standing Status:   Future    Standing Expiration Date:   02/02/2022    All questions were answered. The patient knows to call the clinic with any problems questions or concerns.  cc Birdie Sons, MD  Follow-up  lab MD in 12 weeks  Earlie Server, MD, PhD 02/02/2021

## 2021-02-02 NOTE — Progress Notes (Signed)
Pt here for f/u polycythemia. Reports that he gets short of breath easily, and states "I just don't feel well".

## 2021-02-10 DIAGNOSIS — M48062 Spinal stenosis, lumbar region with neurogenic claudication: Secondary | ICD-10-CM | POA: Diagnosis not present

## 2021-02-10 DIAGNOSIS — M5136 Other intervertebral disc degeneration, lumbar region: Secondary | ICD-10-CM | POA: Diagnosis not present

## 2021-02-10 DIAGNOSIS — M25511 Pain in right shoulder: Secondary | ICD-10-CM | POA: Diagnosis not present

## 2021-02-10 DIAGNOSIS — M25512 Pain in left shoulder: Secondary | ICD-10-CM | POA: Diagnosis not present

## 2021-03-02 DIAGNOSIS — I25119 Atherosclerotic heart disease of native coronary artery with unspecified angina pectoris: Secondary | ICD-10-CM | POA: Diagnosis not present

## 2021-03-02 DIAGNOSIS — I2699 Other pulmonary embolism without acute cor pulmonale: Secondary | ICD-10-CM | POA: Diagnosis not present

## 2021-03-02 DIAGNOSIS — I10 Essential (primary) hypertension: Secondary | ICD-10-CM | POA: Diagnosis not present

## 2021-03-02 DIAGNOSIS — D45 Polycythemia vera: Secondary | ICD-10-CM | POA: Diagnosis not present

## 2021-03-03 DIAGNOSIS — L4050 Arthropathic psoriasis, unspecified: Secondary | ICD-10-CM | POA: Diagnosis not present

## 2021-03-03 DIAGNOSIS — Z79899 Other long term (current) drug therapy: Secondary | ICD-10-CM | POA: Diagnosis not present

## 2021-03-11 ENCOUNTER — Other Ambulatory Visit: Payer: Self-pay | Admitting: Family Medicine

## 2021-03-11 DIAGNOSIS — I10 Essential (primary) hypertension: Secondary | ICD-10-CM

## 2021-03-17 DIAGNOSIS — L570 Actinic keratosis: Secondary | ICD-10-CM | POA: Diagnosis not present

## 2021-03-17 DIAGNOSIS — Z85828 Personal history of other malignant neoplasm of skin: Secondary | ICD-10-CM | POA: Diagnosis not present

## 2021-03-17 DIAGNOSIS — L0109 Other impetigo: Secondary | ICD-10-CM | POA: Diagnosis not present

## 2021-03-17 DIAGNOSIS — L0889 Other specified local infections of the skin and subcutaneous tissue: Secondary | ICD-10-CM | POA: Diagnosis not present

## 2021-05-04 ENCOUNTER — Other Ambulatory Visit: Payer: Self-pay

## 2021-05-04 ENCOUNTER — Inpatient Hospital Stay: Payer: Medicare Other | Attending: Oncology

## 2021-05-04 ENCOUNTER — Inpatient Hospital Stay: Payer: Medicare Other | Admitting: Oncology

## 2021-05-04 ENCOUNTER — Encounter: Payer: Self-pay | Admitting: Oncology

## 2021-05-04 VITALS — BP 150/81 | HR 67 | Temp 96.4°F | Ht 72.0 in | Wt 222.0 lb

## 2021-05-04 DIAGNOSIS — Z8616 Personal history of COVID-19: Secondary | ICD-10-CM | POA: Diagnosis not present

## 2021-05-04 DIAGNOSIS — M069 Rheumatoid arthritis, unspecified: Secondary | ICD-10-CM | POA: Insufficient documentation

## 2021-05-04 DIAGNOSIS — Z1589 Genetic susceptibility to other disease: Secondary | ICD-10-CM | POA: Diagnosis not present

## 2021-05-04 DIAGNOSIS — I739 Peripheral vascular disease, unspecified: Secondary | ICD-10-CM | POA: Insufficient documentation

## 2021-05-04 DIAGNOSIS — D45 Polycythemia vera: Secondary | ICD-10-CM

## 2021-05-04 DIAGNOSIS — Z7901 Long term (current) use of anticoagulants: Secondary | ICD-10-CM | POA: Diagnosis not present

## 2021-05-04 DIAGNOSIS — Z86711 Personal history of pulmonary embolism: Secondary | ICD-10-CM | POA: Diagnosis not present

## 2021-05-04 DIAGNOSIS — D72820 Lymphocytosis (symptomatic): Secondary | ICD-10-CM | POA: Insufficient documentation

## 2021-05-04 DIAGNOSIS — I10 Essential (primary) hypertension: Secondary | ICD-10-CM | POA: Diagnosis not present

## 2021-05-04 DIAGNOSIS — Z7982 Long term (current) use of aspirin: Secondary | ICD-10-CM | POA: Diagnosis not present

## 2021-05-04 LAB — CBC WITH DIFFERENTIAL/PLATELET
Abs Immature Granulocytes: 0.14 10*3/uL — ABNORMAL HIGH (ref 0.00–0.07)
Basophils Absolute: 0.1 10*3/uL (ref 0.0–0.1)
Basophils Relative: 1 %
Eosinophils Absolute: 0.1 10*3/uL (ref 0.0–0.5)
Eosinophils Relative: 1 %
HCT: 44.5 % (ref 39.0–52.0)
Hemoglobin: 15 g/dL (ref 13.0–17.0)
Immature Granulocytes: 2 %
Lymphocytes Relative: 30 %
Lymphs Abs: 2.6 10*3/uL (ref 0.7–4.0)
MCH: 39.2 pg — ABNORMAL HIGH (ref 26.0–34.0)
MCHC: 33.7 g/dL (ref 30.0–36.0)
MCV: 116.2 fL — ABNORMAL HIGH (ref 80.0–100.0)
Monocytes Absolute: 0.7 10*3/uL (ref 0.1–1.0)
Monocytes Relative: 8 %
Neutro Abs: 4.9 10*3/uL (ref 1.7–7.7)
Neutrophils Relative %: 58 %
Platelets: 155 10*3/uL (ref 150–400)
RBC: 3.83 MIL/uL — ABNORMAL LOW (ref 4.22–5.81)
RDW: 13.8 % (ref 11.5–15.5)
WBC: 8.5 10*3/uL (ref 4.0–10.5)
nRBC: 0 % (ref 0.0–0.2)

## 2021-05-04 LAB — COMPREHENSIVE METABOLIC PANEL
ALT: 13 U/L (ref 0–44)
AST: 13 U/L — ABNORMAL LOW (ref 15–41)
Albumin: 3.7 g/dL (ref 3.5–5.0)
Alkaline Phosphatase: 27 U/L — ABNORMAL LOW (ref 38–126)
Anion gap: 5 (ref 5–15)
BUN: 12 mg/dL (ref 8–23)
CO2: 29 mmol/L (ref 22–32)
Calcium: 8.3 mg/dL — ABNORMAL LOW (ref 8.9–10.3)
Chloride: 100 mmol/L (ref 98–111)
Creatinine, Ser: 1.02 mg/dL (ref 0.61–1.24)
GFR, Estimated: >60 mL/min (ref >60)
Glucose, Bld: 91 mg/dL (ref 70–99)
Potassium: 3.9 mmol/L (ref 3.5–5.1)
Sodium: 134 mmol/L — ABNORMAL LOW (ref 135–145)
Total Bilirubin: 1.1 mg/dL (ref 0.3–1.2)
Total Protein: 6 g/dL — ABNORMAL LOW (ref 6.5–8.1)

## 2021-05-04 MED ORDER — HYDROXYUREA 500 MG PO CAPS: 1000.0000 mg | ORAL_CAPSULE | Freq: Every day | ORAL | 1 refills | Status: DC

## 2021-05-04 MED ORDER — APIXABAN 2.5 MG PO TABS: 2.5000 mg | ORAL_TABLET | Freq: Two times a day (BID) | ORAL | 1 refills | Status: DC

## 2021-05-04 NOTE — Progress Notes (Signed)
Patient here for follow up. Patient complains of a red spots on both forearms and thighs. Patient denies spots to be itchy of painful. Patient also concerned with swelling in both ankles.  ?

## 2021-05-04 NOTE — Progress Notes (Signed)
?Hematology oncology progress note ?[phone]  ? ? ?Patient Care Team: ?Edward Mejia as PCP - General (Family Medicine) ?Edward Mejia as Consulting Physician (Ophthalmology) ?Edward Mejia as Referring Physician (Rheumatology) ?Edward Mejia (Pain Medicine) ?Edward Mejia as Consulting Physician (Cardiology) ?Edward Mejia as Consulting Physician (Oncology) ? ?REFERRING PROVIDER: ?Edward Mejia  ?CHIEF COMPLAINTS/REASON FOR VISIT:  ?Follow-up for polycythemia vera-Jak 2V617F mutation ? ?HISTORY OF PRESENTING ILLNESS:  ? ?Edward Mejia is a  76 y.o.  male with PMH including rheumatoid arthritis, peripheral vascular disease hypertension, Covid infection history,, was seen in consultation at the request of  Edward Mejia  for evaluation of polycytosis ?Patient was accompanied by her wife who participated in providing medical history. ?February 2021, patient was hospitalized due to COVID-19 pneumonia, respiratory failure, treated with steroids, remdesivir, received bamlanivimab.  Hospitalization was also completed by [facility], bleeding was  thought to be secondary to hemorrhoids.  He was discharged on 04/07/2019 and presented back to emergency room on 04/12/2019 due to left lower extremity swelling, acute hypoxic respiratory failure.  Work-up showed acute bilateral pulmonary embolism, large left lower extremity DVT, thrombosis and IVC and left external iliac vein.  Patient was started on anticoagulation and transition to Eliquis ?Patient has been on Eliquis since March 2021.  Patient also takes aspirin.  Patient has history of CAD, follows up with Edward Mejia. ? ?Rheumatoid arthritis on Enbrel once a week.  Also on prednisone 7.5 mg daily. ?Patient has also been found to have erythrocytosis. ?09/20/2019, CBC showed hemoglobin of 17.6, reviewed previous medical records, erythrocytosis can be traced back to at least 2017.  Patient also has leukocytosis since March  2021.  Leukocytosis has trended down recently and CBC on 09/20/2019 showed a white count of 10.8, predominantly monocytosis. ? ?Patient is a former smoker, 10-12-pack-year smoking history and he stopped smoking in 1984. ?Currently he has some chronic shortness of breath with exertion.  He has lost weight during his previous hospitalization and has gained weight since discharge.  Sometimes he feels sweaty.  Patient also reports intermittent dizziness/lightheaded episodes for the past couple of months.  He actually had an episode during today's visit and is spontaneously resolved after few minutes.  Denies any shortness of breath, chest pain, vision changes, focal deficits.  Patient has been referred to establish care with neurology for further evaluation. ? ?#  psoriatic arthritis follows up with Edward Mejia.on chronic prednisone.  ?# skin squamous cell carcinoma removal from his scalp. ?INTERVAL HISTORY ?Edward Mejia is a 76 y.o. male who has above history reviewed by me today presents for follow up visit for management of polycythemia vera ?Patient has been on Eliquis 2.5 mg twice daily.  No bleeding events.  Patient request refill. ?Patient takes hydroxyurea 1000 mg daily.  Tolerates well. ?He has noticed bilateral upper extremity forearm " red dots" which were not itching ?Chronic left lower extremity swelling.  Wife is concerned that the right lower extremity started to have some swelling as well. ?Patient recently had removal of scalp lesion, topical treatments.  He temporarily stopped calcium supplementation and recently restarted. ? ?.Review of Systems  ?Constitutional:  Positive for fatigue. Negative for appetite change, chills, fever and unexpected weight change.  ?HENT:   Negative for hearing loss and voice change.   ?Eyes:  Negative for eye problems and icterus.  ?Respiratory:  Negative for chest tightness, cough and shortness of breath.   ?  Cardiovascular:  Negative for chest pain and leg  swelling.  ?Gastrointestinal:  Negative for abdominal distention, abdominal pain and blood in stool.  ?Endocrine: Negative for hot flashes.  ?Genitourinary:  Negative for difficulty urinating, dysuria and frequency.   ?Musculoskeletal:  Negative for arthralgias.  ?Skin:  Positive for rash. Negative for itching.  ?Neurological:  Negative for extremity weakness, light-headedness and numbness.  ?Hematological:  Negative for adenopathy. Does not bruise/bleed easily.  ?Psychiatric/Behavioral:  Negative for confusion.   ? ?MEDICAL HISTORY:  ?Past Medical History:  ?Diagnosis Date  ? Arthritis   ? Bilateral pulmonary embolism ([place]) 03/2019  ? in setting of Covid-19 pneumonia  ? Cancer [facility])   ? skin cancer  ? Cellulitis   ? Cellulitis   ? DVT (deep venous thrombosis) ([place]) 03/2019  ? in setting of Covid-19 pneumonia  ? Erythrocytosis 09/20/2019  ? H/O adenomatous polyp of colon 03/24/2015  ? HCAP (healthcare-associated pneumonia) 04/12/2019  ? History of brain disorder: history of amaurosis fugax 03/24/2015  ? History of pneumonia 04/04/2008  ? History of rheumatic fever 03/24/2015  ? DID Have Rheumatic Fever.    ? Hypertension   ? Left leg DVT ([place]) 04/12/2019  ? In setting of Covid-19 started anticoagulation 04-12-2019. Subsequently Dx PCV and long term NOAC per hematology   ? Peptic ulcer disease   ? Sepsis ([place]) 04/12/2019  ? secondary to Covid pneumonia  ? ? ?SURGICAL HISTORY: ?Past Surgical History:  ?Procedure Laterality Date  ? APPENDECTOMY    ? LEFT HEART CATH AND CORONARY ANGIOGRAPHY Left 02/06/2018  ? Procedure: LEFT HEART CATH AND CORONARY ANGIOGRAPHY;  Surgeon: Edward Mejia;  Location: [facility];  Service: Cardiovascular;  Laterality: Left;  ? MANDIBLE FRACTURE SURGERY    ? fractured jaw  ? PARTIAL GASTRECTOMY    ? peptic ulcer disease  ? ? ?SOCIAL HISTORY: ?Social History  ? ?Socioeconomic History  ? Marital status: Married  ?  Spouse name: Edward Mejia   ? Number of children: 4  ? Years of education: Not on  file  ? Highest education level: 8th grade  ?Occupational History  ? Occupation: Disabled  ?  Comment: due to Psoriatic Arthritis  ?Tobacco Use  ? Smoking status: Former  ?  Types: Cigarettes  ?  Quit date: 09/20/1982  ?  Years since quitting: 38.6  ? Smokeless tobacco: Never  ? Tobacco comments:  ?  quit over 40+ years ago  ?Vaping Use  ? Vaping Use: Never used  ?Substance and Sexual Activity  ? Alcohol use: No  ?  Alcohol/week: 0.0 standard drinks  ? Drug use: No  ? Sexual activity: Not on file  ?Other Topics Concern  ? Not on file  ?Social History Narrative  ? Not on file  ? ?Social Determinants of Health  ? ?Financial Resource Strain: Not on file  ?Food Insecurity: Not on file  ?Transportation Needs: Not on file  ?Physical Activity: Not on file  ?Stress: Not on file  ?Social Connections: Not on file  ?Intimate Partner Violence: Not on file  ? ? ?FAMILY HISTORY: ?Family History  ?Problem Relation Age of Onset  ? Cancer Mother   ?     lung cancer  ? Heart disease Father 65  ?     heart attack  ? Cancer Brother   ?     pancreatic cancer  ? Cancer Brother   ?     pancreatic cancer  ? ? ?ALLERGIES:  is allergic to pravachol [  pravastatin]. ? ?MEDICATIONS:  ?Current Outpatient Medications  ?Medication Sig Dispense Refill  ? acetaminophen (TYLENOL) 500 MG tablet Take 1,000 mg by mouth 3 (three) times daily as needed for mild pain or headache.     ? albuterol (VENTOLIN HFA) 108 (90 Base) MCG/ACT inhaler Inhale 2 puffs into the lungs every 6 (six) hours as needed for wheezing or shortness of breath.    ? aspirin EC 81 MG tablet Take 81 mg by mouth daily. Swallow whole.    ? augmented betamethasone dipropionate (DIPROLENE-AF) 0.05 % cream Apply topically 2 (two) times daily as needed.    ? etanercept (ENBREL) 50 MG/ML injection Inject 50 mg into the skin every Thursday.     ? gabapentin (NEURONTIN) 300 MG capsule Take 900 mg by mouth See admin instructions. At 4 pm    ? HYDROcodone-acetaminophen (NORCO) 10-325 MG tablet  Take 1 tablet by mouth every 6 (six) hours as needed.    ? hydrocortisone 2.5 % cream APPLY TOPICALLY TO THE AFFECTED AREA TWICE DAILY AS NEEDED 30 g 3  ? isosorbide mononitrate (IMDUR) 120 MG 24 hr tablet Ta

## 2021-05-11 DIAGNOSIS — M47816 Spondylosis without myelopathy or radiculopathy, lumbar region: Secondary | ICD-10-CM | POA: Diagnosis not present

## 2021-05-11 DIAGNOSIS — M7061 Trochanteric bursitis, right hip: Secondary | ICD-10-CM | POA: Diagnosis not present

## 2021-05-11 DIAGNOSIS — M48062 Spinal stenosis, lumbar region with neurogenic claudication: Secondary | ICD-10-CM | POA: Diagnosis not present

## 2021-05-27 DIAGNOSIS — L308 Other specified dermatitis: Secondary | ICD-10-CM | POA: Diagnosis not present

## 2021-05-27 DIAGNOSIS — Z85828 Personal history of other malignant neoplasm of skin: Secondary | ICD-10-CM | POA: Diagnosis not present

## 2021-05-27 DIAGNOSIS — D0472 Carcinoma in situ of skin of left lower limb, including hip: Secondary | ICD-10-CM | POA: Diagnosis not present

## 2021-05-27 DIAGNOSIS — L570 Actinic keratosis: Secondary | ICD-10-CM | POA: Diagnosis not present

## 2021-05-27 DIAGNOSIS — D0462 Carcinoma in situ of skin of left upper limb, including shoulder: Secondary | ICD-10-CM | POA: Diagnosis not present

## 2021-05-27 DIAGNOSIS — D692 Other nonthrombocytopenic purpura: Secondary | ICD-10-CM | POA: Diagnosis not present

## 2021-07-15 DIAGNOSIS — Z79899 Other long term (current) drug therapy: Secondary | ICD-10-CM | POA: Diagnosis not present

## 2021-07-15 DIAGNOSIS — L4050 Arthropathic psoriasis, unspecified: Secondary | ICD-10-CM | POA: Diagnosis not present

## 2021-07-26 ENCOUNTER — Ambulatory Visit: Payer: Medicare Other | Admitting: Family Medicine

## 2021-08-04 ENCOUNTER — Inpatient Hospital Stay: Payer: Medicare Other | Attending: Oncology

## 2021-08-04 ENCOUNTER — Inpatient Hospital Stay: Payer: Medicare Other | Admitting: Oncology

## 2021-08-04 ENCOUNTER — Encounter: Payer: Self-pay | Admitting: Oncology

## 2021-08-04 VITALS — BP 144/79 | HR 65 | Temp 97.7°F | Resp 18 | Wt 224.9 lb

## 2021-08-04 DIAGNOSIS — Z86711 Personal history of pulmonary embolism: Secondary | ICD-10-CM | POA: Insufficient documentation

## 2021-08-04 DIAGNOSIS — Z7982 Long term (current) use of aspirin: Secondary | ICD-10-CM | POA: Insufficient documentation

## 2021-08-04 DIAGNOSIS — D72820 Lymphocytosis (symptomatic): Secondary | ICD-10-CM | POA: Insufficient documentation

## 2021-08-04 DIAGNOSIS — Z7901 Long term (current) use of anticoagulants: Secondary | ICD-10-CM | POA: Insufficient documentation

## 2021-08-04 DIAGNOSIS — I2510 Atherosclerotic heart disease of native coronary artery without angina pectoris: Secondary | ICD-10-CM | POA: Insufficient documentation

## 2021-08-04 DIAGNOSIS — D45 Polycythemia vera: Secondary | ICD-10-CM

## 2021-08-04 LAB — CBC WITH DIFFERENTIAL/PLATELET
Abs Immature Granulocytes: 0.09 10*3/uL — ABNORMAL HIGH (ref 0.00–0.07)
Basophils Absolute: 0.1 10*3/uL (ref 0.0–0.1)
Basophils Relative: 1 %
Eosinophils Absolute: 0.1 10*3/uL (ref 0.0–0.5)
Eosinophils Relative: 1 %
HCT: 43.9 % (ref 39.0–52.0)
Hemoglobin: 15 g/dL (ref 13.0–17.0)
Immature Granulocytes: 1 %
Lymphocytes Relative: 29 %
Lymphs Abs: 2.5 10*3/uL (ref 0.7–4.0)
MCH: 39.2 pg — ABNORMAL HIGH (ref 26.0–34.0)
MCHC: 34.2 g/dL (ref 30.0–36.0)
MCV: 114.6 fL — ABNORMAL HIGH (ref 80.0–100.0)
Monocytes Absolute: 0.7 10*3/uL (ref 0.1–1.0)
Monocytes Relative: 8 %
Neutro Abs: 5.3 10*3/uL (ref 1.7–7.7)
Neutrophils Relative %: 60 %
Platelets: 159 10*3/uL (ref 150–400)
RBC: 3.83 MIL/uL — ABNORMAL LOW (ref 4.22–5.81)
RDW: 14.2 % (ref 11.5–15.5)
WBC: 8.7 10*3/uL (ref 4.0–10.5)
nRBC: 0 % (ref 0.0–0.2)

## 2021-08-04 LAB — COMPREHENSIVE METABOLIC PANEL
ALT: 13 U/L (ref 0–44)
AST: 16 U/L (ref 15–41)
Albumin: 3.7 g/dL (ref 3.5–5.0)
Alkaline Phosphatase: 30 U/L — ABNORMAL LOW (ref 38–126)
Anion gap: 3 — ABNORMAL LOW (ref 5–15)
BUN: 10 mg/dL (ref 8–23)
CO2: 30 mmol/L (ref 22–32)
Calcium: 7.9 mg/dL — ABNORMAL LOW (ref 8.9–10.3)
Chloride: 97 mmol/L — ABNORMAL LOW (ref 98–111)
Creatinine, Ser: 1 mg/dL (ref 0.61–1.24)
GFR, Estimated: >60 mL/min (ref >60)
Glucose, Bld: 92 mg/dL (ref 70–99)
Potassium: 3.9 mmol/L (ref 3.5–5.1)
Sodium: 130 mmol/L — ABNORMAL LOW (ref 135–145)
Total Bilirubin: 1 mg/dL (ref 0.3–1.2)
Total Protein: 6 g/dL — ABNORMAL LOW (ref 6.5–8.1)

## 2021-08-04 MED ORDER — APIXABAN 2.5 MG PO TABS: 2.5000 mg | ORAL_TABLET | Freq: Two times a day (BID) | ORAL | 2 refills | Status: DC

## 2021-08-04 NOTE — Assessment & Plan Note (Signed)
observation.  White count has been stable.

## 2021-08-04 NOTE — Assessment & Plan Note (Signed)
continue Elqiuis 2.5mg BID. -Patient also takes aspirin 81 mg for CAD 

## 2021-08-04 NOTE — Progress Notes (Signed)
Hematology/Oncology Progress note [phone] [phone]      Patient Care Team: Pcp, No as PCP - General Lorelee Cover., MD as Consulting Physician (Ophthalmology) Quintin Alto, MD as Referring Physician (Rheumatology) Dorene Ar, MD (Pain Medicine) Teodoro Spray, MD as Consulting Physician (Cardiology) Earlie Server, MD as Consulting Physician (Oncology)   ASSESSMENT & PLAN:   Polycythemia vera ([place]) JAK2 V617F mutation. Labs reviewed and discussed with patient. Hemoglobin is at 15, hematocrit 43.9, at goal. No need for phlebotomy for Recommend patient to continue hydroxyurea 1000 mg daily.  History of pulmonary embolism continue Elqiuis 2.'5mg'$  BID. 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  White count has been stable.  Orders Placed This Encounter  Procedures   CBC with Differential/Platelet    Standing Status:   Future    Standing Expiration Date:   08/05/2022   Comprehensive metabolic panel    Standing Status:   Future    Standing Expiration Date:   08/04/2022   Flow cytometry panel-leukemia/lymphoma work-up    Standing Status:   Future    Standing Expiration Date:   08/05/2022   Follow up in 3 months. All questions were answered. The patient knows to call the clinic with any problems, questions or concerns.  Earlie Server, MD, PhD [facility] Hematology Oncology 08/04/2021    CHIEF COMPLAINTS/REASON FOR VISIT:  Follow-up for polycythemia vera-Jak [id number] mutation  HISTORY OF PRESENTING ILLNESS:   Edward Mejia is a  76 y.o.  male with PMH including rheumatoid arthritis, peripheral vascular disease hypertension, Covid infection history,, was seen in consultation at the request of  Fisher, Kirstie Peri, MD  for evaluation of polycytosis Patient was accompanied by her wife who participated in providing medical history. February 2021, patient was hospitalized due to  COVID-19 pneumonia, respiratory failure, treated with steroids, remdesivir, received bamlanivimab.  Hospitalization was also completed by [facility], bleeding was  thought to be secondary to hemorrhoids.  He was discharged on 04/07/2019 and presented back to emergency room on 04/12/2019 due to left lower extremity swelling, acute hypoxic respiratory failure.  Work-up showed acute bilateral pulmonary embolism, large left lower extremity DVT, thrombosis and IVC and left external iliac vein.  Patient was started on anticoagulation and transition to Eliquis Patient has been on Eliquis since March 2021.  Patient also takes aspirin.  Patient has history of CAD, follows up with Dr. Ubaldo Glassing.  Rheumatoid arthritis on Enbrel once a week.  Also on prednisone 7.5 mg daily. Patient has also been found to have erythrocytosis. 09/20/2019, CBC showed hemoglobin of 17.6, reviewed previous medical records, erythrocytosis can be traced back to at least 2017.  Patient also has leukocytosis since March 2021.  Leukocytosis has trended down recently and CBC on 09/20/2019 showed a white count of 10.8, predominantly monocytosis.  Patient is a former smoker, 10-12-pack-year smoking history and he stopped smoking in 1984. Currently he has some chronic shortness of breath with exertion.  He has lost weight during his previous hospitalization and has gained weight since discharge.  Sometimes he feels sweaty.  Patient also reports intermittent dizziness/lightheaded episodes for the past couple of months.  He actually had an episode during today's visit and is spontaneously resolved after few minutes.  Denies any shortness of breath, chest pain, vision changes, focal deficits.  Patient has been referred to establish care with neurology for further evaluation.  #  psoriatic arthritis follows up with [facility] Dr. Abner Greenspan.on  chronic prednisone.  # skin squamous cell carcinoma removal from his scalp. INTERVAL HISTORY Edward Mejia is a 76 y.o.  male who has above history reviewed by me today presents for follow up visit for management of polycythemia vera Patient has been on Eliquis 2.5 mg twice daily.  Takes hydroxyurea '1000mg'$  daily.  Chronic left lower extremity swelling.  He wears compression stocking  .Review of Systems  Constitutional:  Positive for fatigue. Negative for appetite change, chills, fever and unexpected weight change.  HENT:   Negative for hearing loss and voice change.   Eyes:  Negative for eye problems and icterus.  Respiratory:  Negative for chest tightness, cough and shortness of breath.   Cardiovascular:  Negative for chest pain and leg swelling.  Gastrointestinal:  Negative for abdominal distention, abdominal pain and blood in stool.  Endocrine: Negative for hot flashes.  Genitourinary:  Negative for difficulty urinating, dysuria and frequency.   Musculoskeletal:  Negative for arthralgias.  Skin:  Positive for rash. Negative for itching.  Neurological:  Negative for extremity weakness, light-headedness and numbness.  Hematological:  Negative for adenopathy. Does not bruise/bleed easily.  Psychiatric/Behavioral:  Negative for confusion.     MEDICAL HISTORY:  Past Medical History:  Diagnosis Date   Arthritis    Bilateral pulmonary embolism ([place]) 03/2019   in setting of Covid-19 pneumonia   Cancer ([place])    skin cancer   Cellulitis    Cellulitis    DVT (deep venous thrombosis) ([place]) 03/2019   in setting of Covid-19 pneumonia   Erythrocytosis 09/20/2019   H/O adenomatous polyp of colon 03/24/2015   HCAP (healthcare-associated pneumonia) 04/12/2019   History of brain disorder: history of amaurosis fugax 03/24/2015   History of pneumonia 04/04/2008   History of rheumatic fever 03/24/2015   DID Have Rheumatic Fever.     Hypertension    Left leg DVT ([place]) 04/12/2019   In setting of Covid-19 started anticoagulation 04-12-2019. Subsequently Dx PCV and long term NOAC per hematology    Peptic ulcer disease     Sepsis ([place]) 04/12/2019   secondary to Covid pneumonia    SURGICAL HISTORY: Past Surgical History:  Procedure Laterality Date   APPENDECTOMY     LEFT HEART CATH AND CORONARY ANGIOGRAPHY Left 02/06/2018   Procedure: LEFT HEART CATH AND CORONARY ANGIOGRAPHY;  Surgeon: Teodoro Spray, MD;  Location: [place] [facility];  Service: Cardiovascular;  Laterality: Left;   MANDIBLE FRACTURE SURGERY     fractured jaw   PARTIAL GASTRECTOMY     peptic ulcer disease    SOCIAL HISTORY: Social History   Socioeconomic History   Marital status: Married    Spouse name: Lovey Newcomer    Number of children: 4   Years of education: Not on file   Highest education level: 8th grade  Occupational History   Occupation: Disabled    Comment: due to Psoriatic Arthritis  Tobacco Use   Smoking status: Former    Types: Cigarettes    Quit date: 09/20/1982    Years since quitting: 38.8   Smokeless tobacco: Never   Tobacco comments:    quit over 40+ years ago  Vaping Use   Vaping Use: Never used  Substance and Sexual Activity   Alcohol use: No    Alcohol/week: 0.0 standard drinks of alcohol   Drug use: No   Sexual activity: Not on file  Other Topics Concern   Not on file  Social History Narrative   Not on file  Social Determinants of Health   Financial Resource Strain: Low Risk  (01/08/2019)   Overall Financial Resource Strain (CARDIA)    Difficulty of Paying Living Expenses: Not hard at all  Food Insecurity: No Food Insecurity (02/06/2018)   Hunger Vital Sign    Worried About Running Out of Food in the Last Year: Never true    Ran Out of Food in the Last Year: Never true  Transportation Needs: No Transportation Needs (02/06/2018)   PRAPARE - Hydrologist (Medical): No    Lack of Transportation (Non-Medical): No  Physical Activity: Unknown (01/08/2019)   Exercise Vital Sign    Days of Exercise per Week: Not on file    Minutes of Exercise per Session: 0 min  Stress:  No Stress Concern Present (01/08/2019)   [place]    Feeling of Stress : Not at all  Social Connections: Unknown (01/08/2019)   Social Connection and Isolation Panel [NHANES]    Frequency of Communication with Friends and Family: Patient refused    Frequency of Social Gatherings with Friends and Family: Patient refused    Attends Religious Services: Patient refused    Active Member of Clubs or Organizations: Patient refused    Attends Archivist Meetings: Patient refused    Marital Status: Patient refused  Intimate Partner Violence: Unknown (01/08/2019)   Humiliation, Afraid, Rape, and Kick questionnaire    Fear of Current or Ex-Partner: Patient refused    Emotionally Abused: Patient refused    Physically Abused: Patient refused    Sexually Abused: Patient refused    FAMILY HISTORY: Family History  Problem Relation Age of Onset   Cancer Mother        lung cancer   Heart disease Father 84       heart attack   Cancer Brother        pancreatic cancer   Cancer Brother        pancreatic cancer    ALLERGIES:  is allergic to pravachol [pravastatin].  MEDICATIONS:  Current Outpatient Medications  Medication Sig Dispense Refill   acetaminophen (TYLENOL) 500 MG tablet Take 1,000 mg by mouth 3 (three) times daily as needed for mild pain or headache.      albuterol (VENTOLIN HFA) 108 (90 Base) MCG/ACT inhaler Inhale 2 puffs into the lungs every 6 (six) hours as needed for wheezing or shortness of breath.     aspirin EC 81 MG tablet Take 81 mg by mouth daily. Swallow whole.     etanercept (ENBREL) 50 MG/ML injection Inject 50 mg into the skin every Thursday.      gabapentin (NEURONTIN) 300 MG capsule Take 900 mg by mouth See admin instructions. At 4 pm     HYDROcodone-acetaminophen (NORCO) 10-325 MG tablet Take 1 tablet by mouth every 6 (six) hours as needed.     hydroxyurea (HYDREA) 500 MG capsule Take 2  capsules (1,000 mg total) by mouth daily. May take with food to minimize GI side effects. 180 capsule 1   isosorbide mononitrate (IMDUR) 120 MG 24 hr tablet Take 120 mg by mouth daily.      ranolazine (RANEXA) 500 MG 12 hr tablet Take 500 mg by mouth in the morning and at bedtime.     triamcinolone acetonide (KENALOG-40) 40 MG/ML injection (RADIOLOGY ONLY) Inject into the articular space.     valsartan (DIOVAN) 160 MG tablet Take 1 tablet (160 mg total) by mouth daily.  90 tablet 4   apixaban (ELIQUIS) 2.5 MG TABS tablet Take 1 tablet (2.5 mg total) by mouth 2 (two) times daily. 180 tablet 2   augmented betamethasone dipropionate (DIPROLENE-AF) 0.05 % cream Apply topically 2 (two) times daily as needed. (Patient not taking: Reported on 08/04/2021)     hydrocortisone 2.5 % cream APPLY TOPICALLY TO THE AFFECTED AREA TWICE DAILY AS NEEDED (Patient not taking: Reported on 08/04/2021) 30 g 3   nitroGLYCERIN (NITROSTAT) 0.4 MG SL tablet Place 0.4 mg under the tongue every 5 (five) minutes as needed for chest pain. (Patient not taking: Reported on 08/04/2021)     predniSONE (DELTASONE) 5 MG tablet Take 7.5 mg by mouth every morning.     No current facility-administered medications for this visit.     PHYSICAL EXAMINATION: ECOG PERFORMANCE STATUS: 1 - Symptomatic but completely ambulatory Today's Vitals   08/04/21 1356  BP: (!) 144/79  Pulse: 65  Resp: 18  Temp: 97.7 F (36.5 C)  SpO2: 98%  Weight: 224 lb 14.4 oz (102 kg)  PainSc: 7    Body mass index is 30.5 kg/m.  Marland Kitchen Physical Exam Constitutional:      General: He is not in acute distress. HENT:     Head: Normocephalic and atraumatic.  Eyes:     General: No scleral icterus. Cardiovascular:     Rate and Rhythm: Normal rate and regular rhythm.     Heart sounds: Normal heart sounds.  Pulmonary:     Effort: Pulmonary effort is normal. No respiratory distress.     Breath sounds: No wheezing.  Abdominal:     General: Bowel sounds are  normal. There is no distension.     Palpations: Abdomen is soft.  Musculoskeletal:        General: No deformity. Normal range of motion.     Cervical back: Normal range of motion and neck supple.  Skin:    General: Skin is warm and dry.  Neurological:     Mental Status: He is alert and oriented to person, place, and time. Mental status is at baseline.     Cranial Nerves: No cranial nerve deficit.     Coordination: Coordination normal.  Psychiatric:        Mood and Affect: Mood normal.     LABORATORY DATA:  I have reviewed the data as listed    Latest Ref Rng & Units 08/04/2021    1:43 PM 05/04/2021    1:05 PM 02/02/2021    1:26 PM  CBC  WBC 4.0 - 10.5 K/uL 8.7  8.5  10.1   Hemoglobin 13.0 - 17.0 g/dL 15.0  15.0  14.8   Hematocrit 39.0 - 52.0 % 43.9  44.5  44.7   Platelets 150 - 400 K/uL 159  155  338       Latest Ref Rng & Units 08/04/2021    1:43 PM 05/04/2021    1:05 PM 02/02/2021    1:26 PM  CMP  Glucose 70 - 99 mg/dL 92  91  93   BUN 8 - 23 mg/dL '10  12  10   '$ Creatinine 0.61 - 1.24 mg/dL 1.00  1.02  1.04   Sodium 135 - 145 mmol/L 130  134  131   Potassium 3.5 - 5.1 mmol/L 3.9  3.9  4.4   Chloride 98 - 111 mmol/L 97  100  97   CO2 22 - 32 mmol/L '30  29  27   '$ Calcium 8.9 - 10.3 mg/dL  7.9  8.3  8.3   Total Protein 6.5 - 8.1 g/dL 6.0  6.0  6.0   Total Bilirubin 0.3 - 1.2 mg/dL 1.0  1.1  1.1   Alkaline Phos 38 - 126 U/L '30  27  31   '$ AST 15 - 41 U/L '16  13  18   '$ ALT 0 - 44 U/L '13  13  9      '$ Iron/TIBC/Ferritin/ %Sat    Component Value Date/Time   IRON 49 09/20/2019 1201   TIBC 318 09/20/2019 1201   FERRITIN 24 09/20/2019 1201   IRONPCTSAT 15 (L) 09/20/2019 1201      RADIOGRAPHIC STUDIES: I have personally reviewed the radiological images as listed and agreed with the findings in the report. No results found.

## 2021-08-04 NOTE — Assessment & Plan Note (Signed)
JAK2 V617F mutation. Labs reviewed and discussed with patient. Hemoglobin is at 15, hematocrit 43.9, at goal. No need for phlebotomy for Recommend patient to continue hydroxyurea 1000 mg daily.

## 2021-08-11 DIAGNOSIS — G894 Chronic pain syndrome: Secondary | ICD-10-CM | POA: Diagnosis not present

## 2021-08-11 DIAGNOSIS — M47816 Spondylosis without myelopathy or radiculopathy, lumbar region: Secondary | ICD-10-CM | POA: Diagnosis not present

## 2021-08-11 DIAGNOSIS — M7061 Trochanteric bursitis, right hip: Secondary | ICD-10-CM | POA: Diagnosis not present

## 2021-08-11 DIAGNOSIS — Z79891 Long term (current) use of opiate analgesic: Secondary | ICD-10-CM | POA: Diagnosis not present

## 2021-08-31 DIAGNOSIS — R0789 Other chest pain: Secondary | ICD-10-CM | POA: Diagnosis not present

## 2021-08-31 DIAGNOSIS — Z7901 Long term (current) use of anticoagulants: Secondary | ICD-10-CM | POA: Diagnosis not present

## 2021-08-31 DIAGNOSIS — D45 Polycythemia vera: Secondary | ICD-10-CM | POA: Diagnosis not present

## 2021-08-31 DIAGNOSIS — I2699 Other pulmonary embolism without acute cor pulmonale: Secondary | ICD-10-CM | POA: Diagnosis not present

## 2021-09-20 DIAGNOSIS — R0789 Other chest pain: Secondary | ICD-10-CM | POA: Diagnosis not present

## 2021-09-22 DIAGNOSIS — I25119 Atherosclerotic heart disease of native coronary artery with unspecified angina pectoris: Secondary | ICD-10-CM | POA: Diagnosis not present

## 2021-09-22 DIAGNOSIS — I10 Essential (primary) hypertension: Secondary | ICD-10-CM | POA: Diagnosis not present

## 2021-09-22 DIAGNOSIS — I2699 Other pulmonary embolism without acute cor pulmonale: Secondary | ICD-10-CM | POA: Diagnosis not present

## 2021-09-22 DIAGNOSIS — Z7901 Long term (current) use of anticoagulants: Secondary | ICD-10-CM | POA: Diagnosis not present

## 2021-09-27 DIAGNOSIS — R9439 Abnormal result of other cardiovascular function study: Secondary | ICD-10-CM | POA: Diagnosis not present

## 2021-09-27 DIAGNOSIS — I10 Essential (primary) hypertension: Secondary | ICD-10-CM | POA: Diagnosis not present

## 2021-09-27 DIAGNOSIS — Z86718 Personal history of other venous thrombosis and embolism: Secondary | ICD-10-CM | POA: Diagnosis not present

## 2021-09-27 DIAGNOSIS — Z8616 Personal history of COVID-19: Secondary | ICD-10-CM | POA: Diagnosis not present

## 2021-09-27 DIAGNOSIS — Z7901 Long term (current) use of anticoagulants: Secondary | ICD-10-CM | POA: Diagnosis not present

## 2021-09-27 DIAGNOSIS — I25118 Atherosclerotic heart disease of native coronary artery with other forms of angina pectoris: Secondary | ICD-10-CM | POA: Diagnosis not present

## 2021-09-27 DIAGNOSIS — Z86711 Personal history of pulmonary embolism: Secondary | ICD-10-CM | POA: Diagnosis not present

## 2021-09-29 DIAGNOSIS — I25118 Atherosclerotic heart disease of native coronary artery with other forms of angina pectoris: Secondary | ICD-10-CM | POA: Diagnosis not present

## 2021-09-29 DIAGNOSIS — I2582 Chronic total occlusion of coronary artery: Secondary | ICD-10-CM | POA: Diagnosis not present

## 2021-09-29 DIAGNOSIS — I10 Essential (primary) hypertension: Secondary | ICD-10-CM | POA: Diagnosis not present

## 2021-10-05 ENCOUNTER — Telehealth: Payer: Self-pay | Admitting: *Deleted

## 2021-10-05 DIAGNOSIS — Z8616 Personal history of COVID-19: Secondary | ICD-10-CM | POA: Diagnosis not present

## 2021-10-05 DIAGNOSIS — I25118 Atherosclerotic heart disease of native coronary artery with other forms of angina pectoris: Secondary | ICD-10-CM | POA: Diagnosis not present

## 2021-10-05 DIAGNOSIS — I10 Essential (primary) hypertension: Secondary | ICD-10-CM | POA: Diagnosis not present

## 2021-10-05 DIAGNOSIS — Z87891 Personal history of nicotine dependence: Secondary | ICD-10-CM | POA: Diagnosis not present

## 2021-10-05 DIAGNOSIS — L4050 Arthropathic psoriasis, unspecified: Secondary | ICD-10-CM | POA: Diagnosis not present

## 2021-10-05 NOTE — Telephone Encounter (Signed)
[name] called reporting that patient next appointment is in Sept 29 th and patient went for a heart cath last week and again today he sent to have some stints placed but because f his labs, they were not able to do the. She is asking that his appointment be moved up t see Dr Tasia Catchings to go over his labs  NA/K/HGB/HCT Panel, Arterial Order: [id number]  Ref Range & Units 07:12  Potassium, Arterial 3.5 - 5.0 mmol/L 4.4   Sodium, Arterial 135 - 145 mmol/L 129 Low    Hemoglobin, Arterial 13.7 - 17.3 g/dL 15.7   Hematocrit, Arterial 39.0 - 49.0 % 47.0   Resulting Agency  [facility]   Specimen Collected: 10/05/21 07:12   Performed by: [place] Last Resulted: 10/05/21 07:26  Received From: [place]  Result Received: 10/05/21 15:26   View Encounter    Received Information  NA/K/HGB/HCT Panel, Arterial (Order [id number])    suggestion  Information displayed in this report may not trend or trigger automated decision support.    Contains abnormal data NA/K/HGB/HCT Panel, Arterial Order: [id number]  Ref Range & Units 07:12  Potassium, Arterial 3.5 - 5.0 mmol/L 4.4   Sodium, Arterial 135 - 145 mmol/L 129 Low    Hemoglobin, Arterial 13.7 - 17.3 g/dL 15.7   Hematocrit, Arterial 39.0 - 49.0 % 47.0   Resulting Agency  [facility]   Specimen Collected: 10/05/21 07:12   Performed by: [place] Last Resulted: 10/05/21 07:26  Received From: [place]  Result Received: 10/05/21 15:26  POC Creatinine Order: [id number]  Ref Range & Units 07:16  POC Creatinine 0.6 - 1.3 mg/dL 1.7 High    Resulting Agency  [facility]  Narrative Performed by [place] POC TEST(S) ABOVE PERFORMED AT THE PATIENT CARE LOCATION AND OVERSEEN BY THE [facility] POCT PROGRAM.

## 2021-10-06 ENCOUNTER — Encounter: Payer: Self-pay | Admitting: Oncology

## 2021-10-06 NOTE — Telephone Encounter (Signed)
Call returned to Edward Mejia and advised per Dr Tasia Catchings that she reviewed his labs and he needs to follow up with his pcp or cardiology

## 2021-10-13 DIAGNOSIS — I25118 Atherosclerotic heart disease of native coronary artery with other forms of angina pectoris: Secondary | ICD-10-CM | POA: Diagnosis not present

## 2021-10-13 DIAGNOSIS — I25119 Atherosclerotic heart disease of native coronary artery with unspecified angina pectoris: Secondary | ICD-10-CM | POA: Diagnosis not present

## 2021-10-20 DIAGNOSIS — I2699 Other pulmonary embolism without acute cor pulmonale: Secondary | ICD-10-CM | POA: Diagnosis not present

## 2021-10-20 DIAGNOSIS — Z7901 Long term (current) use of anticoagulants: Secondary | ICD-10-CM | POA: Diagnosis not present

## 2021-10-20 DIAGNOSIS — I25119 Atherosclerotic heart disease of native coronary artery with unspecified angina pectoris: Secondary | ICD-10-CM | POA: Diagnosis not present

## 2021-10-20 DIAGNOSIS — D45 Polycythemia vera: Secondary | ICD-10-CM | POA: Diagnosis not present

## 2021-10-25 DIAGNOSIS — I10 Essential (primary) hypertension: Secondary | ICD-10-CM | POA: Diagnosis not present

## 2021-10-25 DIAGNOSIS — I25118 Atherosclerotic heart disease of native coronary artery with other forms of angina pectoris: Secondary | ICD-10-CM | POA: Diagnosis not present

## 2021-10-25 DIAGNOSIS — Z79899 Other long term (current) drug therapy: Secondary | ICD-10-CM | POA: Diagnosis not present

## 2021-10-25 DIAGNOSIS — Z8616 Personal history of COVID-19: Secondary | ICD-10-CM | POA: Diagnosis not present

## 2021-10-25 DIAGNOSIS — Z955 Presence of coronary angioplasty implant and graft: Secondary | ICD-10-CM | POA: Diagnosis not present

## 2021-10-25 DIAGNOSIS — Z86718 Personal history of other venous thrombosis and embolism: Secondary | ICD-10-CM | POA: Diagnosis not present

## 2021-10-25 DIAGNOSIS — Z86711 Personal history of pulmonary embolism: Secondary | ICD-10-CM | POA: Diagnosis not present

## 2021-10-25 DIAGNOSIS — I2582 Chronic total occlusion of coronary artery: Secondary | ICD-10-CM | POA: Diagnosis not present

## 2021-10-25 DIAGNOSIS — L4050 Arthropathic psoriasis, unspecified: Secondary | ICD-10-CM | POA: Diagnosis not present

## 2021-10-25 DIAGNOSIS — Z7952 Long term (current) use of systemic steroids: Secondary | ICD-10-CM | POA: Diagnosis not present

## 2021-10-25 DIAGNOSIS — Z87891 Personal history of nicotine dependence: Secondary | ICD-10-CM | POA: Diagnosis not present

## 2021-10-25 DIAGNOSIS — I25119 Atherosclerotic heart disease of native coronary artery with unspecified angina pectoris: Secondary | ICD-10-CM | POA: Diagnosis not present

## 2021-10-25 DIAGNOSIS — Z7901 Long term (current) use of anticoagulants: Secondary | ICD-10-CM | POA: Diagnosis not present

## 2021-10-26 DIAGNOSIS — Z955 Presence of coronary angioplasty implant and graft: Secondary | ICD-10-CM | POA: Diagnosis not present

## 2021-10-26 DIAGNOSIS — Z87891 Personal history of nicotine dependence: Secondary | ICD-10-CM | POA: Diagnosis not present

## 2021-10-26 DIAGNOSIS — Z7952 Long term (current) use of systemic steroids: Secondary | ICD-10-CM | POA: Diagnosis not present

## 2021-10-26 DIAGNOSIS — Z79899 Other long term (current) drug therapy: Secondary | ICD-10-CM | POA: Diagnosis not present

## 2021-10-26 DIAGNOSIS — Z86718 Personal history of other venous thrombosis and embolism: Secondary | ICD-10-CM | POA: Diagnosis not present

## 2021-10-26 DIAGNOSIS — I10 Essential (primary) hypertension: Secondary | ICD-10-CM | POA: Diagnosis not present

## 2021-10-26 DIAGNOSIS — Z86711 Personal history of pulmonary embolism: Secondary | ICD-10-CM | POA: Diagnosis not present

## 2021-10-26 DIAGNOSIS — Z8616 Personal history of COVID-19: Secondary | ICD-10-CM | POA: Diagnosis not present

## 2021-10-26 DIAGNOSIS — I25118 Atherosclerotic heart disease of native coronary artery with other forms of angina pectoris: Secondary | ICD-10-CM | POA: Diagnosis not present

## 2021-10-26 DIAGNOSIS — Z7901 Long term (current) use of anticoagulants: Secondary | ICD-10-CM | POA: Diagnosis not present

## 2021-10-26 DIAGNOSIS — L4050 Arthropathic psoriasis, unspecified: Secondary | ICD-10-CM | POA: Diagnosis not present

## 2021-10-26 DIAGNOSIS — I2582 Chronic total occlusion of coronary artery: Secondary | ICD-10-CM | POA: Diagnosis not present

## 2021-11-01 ENCOUNTER — Inpatient Hospital Stay: Payer: Medicare Other | Attending: Oncology

## 2021-11-01 DIAGNOSIS — Z7901 Long term (current) use of anticoagulants: Secondary | ICD-10-CM | POA: Diagnosis not present

## 2021-11-01 DIAGNOSIS — D45 Polycythemia vera: Secondary | ICD-10-CM | POA: Diagnosis not present

## 2021-11-01 DIAGNOSIS — D72820 Lymphocytosis (symptomatic): Secondary | ICD-10-CM | POA: Insufficient documentation

## 2021-11-01 DIAGNOSIS — Z86711 Personal history of pulmonary embolism: Secondary | ICD-10-CM | POA: Diagnosis not present

## 2021-11-01 DIAGNOSIS — R634 Abnormal weight loss: Secondary | ICD-10-CM | POA: Insufficient documentation

## 2021-11-01 DIAGNOSIS — E871 Hypo-osmolality and hyponatremia: Secondary | ICD-10-CM | POA: Diagnosis not present

## 2021-11-01 LAB — CBC WITH DIFFERENTIAL/PLATELET
Abs Immature Granulocytes: 0.1 K/uL — ABNORMAL HIGH (ref 0.00–0.07)
Basophils Absolute: 0.1 K/uL (ref 0.0–0.1)
Basophils Relative: 1 %
Eosinophils Absolute: 0 K/uL (ref 0.0–0.5)
Eosinophils Relative: 1 %
HCT: 41.5 % (ref 39.0–52.0)
Hemoglobin: 14.5 g/dL (ref 13.0–17.0)
Immature Granulocytes: 1 %
Lymphocytes Relative: 33 %
Lymphs Abs: 2.5 K/uL (ref 0.7–4.0)
MCH: 40.2 pg — ABNORMAL HIGH (ref 26.0–34.0)
MCHC: 34.9 g/dL (ref 30.0–36.0)
MCV: 115 fL — ABNORMAL HIGH (ref 80.0–100.0)
Monocytes Absolute: 0.6 K/uL (ref 0.1–1.0)
Monocytes Relative: 7 %
Neutro Abs: 4.5 K/uL (ref 1.7–7.7)
Neutrophils Relative %: 57 %
Platelets: 187 K/uL (ref 150–400)
RBC: 3.61 MIL/uL — ABNORMAL LOW (ref 4.22–5.81)
RDW: 14.1 % (ref 11.5–15.5)
WBC: 7.7 K/uL (ref 4.0–10.5)
nRBC: 0 % (ref 0.0–0.2)

## 2021-11-01 LAB — COMPREHENSIVE METABOLIC PANEL
ALT: 14 U/L (ref 0–44)
AST: 14 U/L — ABNORMAL LOW (ref 15–41)
Albumin: 3.7 g/dL (ref 3.5–5.0)
Alkaline Phosphatase: 26 U/L — ABNORMAL LOW (ref 38–126)
Anion gap: 3 — ABNORMAL LOW (ref 5–15)
BUN: 11 mg/dL (ref 8–23)
CO2: 30 mmol/L (ref 22–32)
Calcium: 8.4 mg/dL — ABNORMAL LOW (ref 8.9–10.3)
Chloride: 95 mmol/L — ABNORMAL LOW (ref 98–111)
Creatinine, Ser: 1.03 mg/dL (ref 0.61–1.24)
GFR, Estimated: >60 mL/min (ref >60)
Glucose, Bld: 95 mg/dL (ref 70–99)
Potassium: 4.6 mmol/L (ref 3.5–5.1)
Sodium: 128 mmol/L — ABNORMAL LOW (ref 135–145)
Total Bilirubin: 1.3 mg/dL — ABNORMAL HIGH (ref 0.3–1.2)
Total Protein: 6 g/dL — ABNORMAL LOW (ref 6.5–8.1)

## 2021-11-03 LAB — COMP PANEL: LEUKEMIA/LYMPHOMA: Immunophenotypic Profile: 10

## 2021-11-05 ENCOUNTER — Telehealth: Payer: Self-pay

## 2021-11-05 ENCOUNTER — Encounter: Payer: Self-pay | Admitting: Oncology

## 2021-11-05 ENCOUNTER — Inpatient Hospital Stay: Payer: Medicare Other | Admitting: Oncology

## 2021-11-05 VITALS — BP 137/69 | HR 67 | Temp 98.1°F | Resp 18 | Wt 213.7 lb

## 2021-11-05 DIAGNOSIS — Z7901 Long term (current) use of anticoagulants: Secondary | ICD-10-CM | POA: Diagnosis not present

## 2021-11-05 DIAGNOSIS — D72820 Lymphocytosis (symptomatic): Secondary | ICD-10-CM | POA: Diagnosis not present

## 2021-11-05 DIAGNOSIS — E871 Hypo-osmolality and hyponatremia: Secondary | ICD-10-CM | POA: Diagnosis not present

## 2021-11-05 DIAGNOSIS — D45 Polycythemia vera: Secondary | ICD-10-CM

## 2021-11-05 DIAGNOSIS — Z86711 Personal history of pulmonary embolism: Secondary | ICD-10-CM

## 2021-11-05 DIAGNOSIS — R634 Abnormal weight loss: Secondary | ICD-10-CM | POA: Diagnosis not present

## 2021-11-05 NOTE — Progress Notes (Signed)
Pt here for follow up. No new concerns voiced.   

## 2021-11-05 NOTE — Assessment & Plan Note (Signed)
JAK2 V617F mutation. Labs reviewed and discussed with patient. Recommend patient to continue hydroxyurea 1000 mg daily.

## 2021-11-05 NOTE — Progress Notes (Signed)
Hematology/Oncology Progress note [phone] [phone]      Patient Care Team: Pcp, No as PCP - General Lorelee Cover., MD as Consulting Physician (Ophthalmology) Quintin Alto, MD as Referring Physician (Rheumatology) Dorene Ar, MD (Pain Medicine) Teodoro Spray, MD as Consulting Physician (Cardiology) Earlie Server, MD as Consulting Physician (Oncology)   ASSESSMENT & PLAN:   Polycythemia vera ([place]) JAK2 V617F mutation. Labs reviewed and discussed with patient. Recommend patient to continue hydroxyurea 1000 mg daily.  History of pulmonary embolism continue Elqiuis 2.'5mg'$  BID. -Patient also takes aspirin 81 mg for CAD  Monoclonal B-cell lymphocytosis of undetermined significance Repeat flowcytometry showed stable monoclonal lymphocytosis clone, <5000   Unintentional weight loss CT abdomen pelvis with contrast  Hyponatremia Etiology unknown, likely due to decreased oral intake.  Encourage patient to increase oral hydration.   Orders Placed This Encounter  Procedures   CT Abdomen Pelvis W Contrast    Standing Status:   Future    Standing Expiration Date:   11/05/2022    Order Specific Question:   If indicated for the ordered procedure, I authorize the administration of contrast media per Radiology protocol    Answer:   Yes    Order Specific Question:   Preferred imaging location?    Answer:   [facility]    Order Specific Question:   Is Oral Contrast requested for this exam?    Answer:   Yes, Per Radiology protocol   Follow up TBD All questions were answered. The patient knows to call the clinic with any problems, questions or concerns.  Earlie Server, MD, PhD [facility] Hematology Oncology 11/05/2021    CHIEF COMPLAINTS/REASON FOR VISIT:  Follow-up for polycythemia vera-Jak [id number] mutation  HISTORY OF PRESENTING ILLNESS:   Edward Mejia is a  76 y.o.  male with PMH including rheumatoid arthritis, peripheral vascular disease  hypertension, Covid infection history,, was seen in consultation at the request of  Fisher, Kirstie Peri, MD  for evaluation of polycytosis Patient was accompanied by her wife who participated in providing medical history. February 2021, patient was hospitalized due to COVID-19 pneumonia, respiratory failure, treated with steroids, remdesivir, received bamlanivimab.  Hospitalization was also completed by [facility], bleeding was  thought to be secondary to hemorrhoids.  He was discharged on 04/07/2019 and presented back to emergency room on 04/12/2019 due to left lower extremity swelling, acute hypoxic respiratory failure.  Work-up showed acute bilateral pulmonary embolism, large left lower extremity DVT, thrombosis and IVC and left external iliac vein.  Patient was started on anticoagulation and transition to Eliquis Patient has been on Eliquis since March 2021.  Patient also takes aspirin.  Patient has history of CAD, follows up with Dr. Ubaldo Glassing.  Rheumatoid arthritis on Enbrel once a week.  Also on prednisone 7.5 mg daily. Patient has also been found to have erythrocytosis. 09/20/2019, CBC showed hemoglobin of 17.6, reviewed previous medical records, erythrocytosis can be traced back to at least 2017.  Patient also has leukocytosis since March 2021.  Leukocytosis has trended down recently and CBC on 09/20/2019 showed a white count of 10.8, predominantly monocytosis.  Patient is a former smoker, 10-12-pack-year smoking history and he stopped smoking in 1984. Currently he has some chronic shortness of breath with exertion.  He has lost weight during his previous hospitalization and has gained weight since discharge.  Sometimes he feels sweaty.  Patient also reports intermittent dizziness/lightheaded episodes for the past couple of months.  He actually had an episode during  today's visit and is spontaneously resolved after few minutes.  Denies any shortness of breath, chest pain, vision changes, focal deficits.  Patient  has been referred to establish care with neurology for further evaluation.  #  psoriatic arthritis follows up with [facility] Dr. Abner Greenspan.on chronic prednisone.  # skin squamous cell carcinoma removal from his scalp. INTERVAL HISTORY Edward Mejia is a 76 y.o. male who has above history reviewed by me today presents for follow up visit for management of polycythemia vera Patient has been on Eliquis 2.5 mg twice daily.  Takes hydroxyurea '1000mg'$  daily.  He does not feel well today. 09/27/2021 He recently underwent cardiac cath for abnormal stress test and unstable angina s/p drug eluting coronary stent placement.  Since August, he has lost 10 pounds, appetite has decreased. No night sweats, fever.    .Review of Systems  Constitutional:  Positive for fatigue. Negative for appetite change, chills, fever and unexpected weight change.  HENT:   Negative for hearing loss and voice change.   Eyes:  Negative for eye problems and icterus.  Respiratory:  Negative for chest tightness, cough and shortness of breath.   Cardiovascular:  Negative for chest pain and leg swelling.  Gastrointestinal:  Negative for abdominal distention, abdominal pain and blood in stool.  Endocrine: Negative for hot flashes.  Genitourinary:  Negative for difficulty urinating, dysuria and frequency.   Musculoskeletal:  Negative for arthralgias.  Skin:  Positive for rash. Negative for itching.  Neurological:  Negative for extremity weakness, light-headedness and numbness.  Hematological:  Negative for adenopathy. Does not bruise/bleed easily.  Psychiatric/Behavioral:  Negative for confusion.     MEDICAL HISTORY:  Past Medical History:  Diagnosis Date   Arthritis    Bilateral pulmonary embolism ([place]) 03/2019   in setting of Covid-19 pneumonia   Cancer ([place])    skin cancer   Cellulitis    Cellulitis    DVT (deep venous thrombosis) ([place]) 03/2019   in setting of Covid-19 pneumonia   Erythrocytosis 09/20/2019   H/O  adenomatous polyp of colon 03/24/2015   HCAP (healthcare-associated pneumonia) 04/12/2019   History of brain disorder: history of amaurosis fugax 03/24/2015   History of pneumonia 04/04/2008   History of rheumatic fever 03/24/2015   DID Have Rheumatic Fever.     Hypertension    Left leg DVT ([place]) 04/12/2019   In setting of Covid-19 started anticoagulation 04-12-2019. Subsequently Dx PCV and long term NOAC per hematology    Peptic ulcer disease    Sepsis ([place]) 04/12/2019   secondary to Covid pneumonia    SURGICAL HISTORY: Past Surgical History:  Procedure Laterality Date   APPENDECTOMY     LEFT HEART CATH AND CORONARY ANGIOGRAPHY Left 02/06/2018   Procedure: LEFT HEART CATH AND CORONARY ANGIOGRAPHY;  Surgeon: Teodoro Spray, MD;  Location: [facility];  Service: Cardiovascular;  Laterality: Left;   MANDIBLE FRACTURE SURGERY     fractured jaw   PARTIAL GASTRECTOMY     peptic ulcer disease    SOCIAL HISTORY: Social History   Socioeconomic History   Marital status: Married    Spouse name: Lovey Newcomer    Number of children: 4   Years of education: Not on file   Highest education level: 8th grade  Occupational History   Occupation: Disabled    Comment: due to Psoriatic Arthritis  Tobacco Use   Smoking status: Former    Types: Cigarettes    Quit date: 09/20/1982    Years since quitting:  39.1   Smokeless tobacco: Never   Tobacco comments:    quit over 40+ years ago  Vaping Use   Vaping Use: Never used  Substance and Sexual Activity   Alcohol use: No    Alcohol/week: 0.0 standard drinks of alcohol   Drug use: No   Sexual activity: Not on file  Other Topics Concern   Not on file  Social History Narrative   Not on file   Social Determinants of Health   Financial Resource Strain: Low Risk  (01/08/2019)   Overall Financial Resource Strain (CARDIA)    Difficulty of Paying Living Expenses: Not hard at all  Food Insecurity: No Food Insecurity (02/06/2018)   Hunger Vital Sign     Worried About Running Out of Food in the Last Year: Never true    Ran Out of Food in the Last Year: Never true  Transportation Needs: No Transportation Needs (02/06/2018)   PRAPARE - Hydrologist (Medical): No    Lack of Transportation (Non-Medical): No  Physical Activity: Unknown (01/08/2019)   Exercise Vital Sign    Days of Exercise per Week: Not on file    Minutes of Exercise per Session: 0 min  Stress: No Stress Concern Present (01/08/2019)   [place]    Feeling of Stress : Not at all  Social Connections: Unknown (01/08/2019)   Social Connection and Isolation Panel [NHANES]    Frequency of Communication with Friends and Family: Patient refused    Frequency of Social Gatherings with Friends and Family: Patient refused    Attends Religious Services: Patient refused    Active Member of Clubs or Organizations: Patient refused    Attends Archivist Meetings: Patient refused    Marital Status: Patient refused  Intimate Partner Violence: Unknown (01/08/2019)   Humiliation, Afraid, Rape, and Kick questionnaire    Fear of Current or Ex-Partner: Patient refused    Emotionally Abused: Patient refused    Physically Abused: Patient refused    Sexually Abused: Patient refused    FAMILY HISTORY: Family History  Problem Relation Age of Onset   Cancer Mother        lung cancer   Heart disease Father 43       heart attack   Cancer Brother        pancreatic cancer   Cancer Brother        pancreatic cancer    ALLERGIES:  is allergic to pravachol [pravastatin].  MEDICATIONS:  Current Outpatient Medications  Medication Sig Dispense Refill   acetaminophen (TYLENOL) 500 MG tablet Take 1,000 mg by mouth 3 (three) times daily as needed for mild pain or headache.      albuterol (VENTOLIN HFA) 108 (90 Base) MCG/ACT inhaler Inhale 2 puffs into the lungs every 6 (six) hours as needed for  wheezing or shortness of breath.     apixaban (ELIQUIS) 2.5 MG TABS tablet Take 1 tablet (2.5 mg total) by mouth 2 (two) times daily. 180 tablet 2   clopidogrel (PLAVIX) 75 MG tablet Take 75 mg by mouth daily.     etanercept (ENBREL) 50 MG/ML injection Inject 50 mg into the skin every Thursday.      gabapentin (NEURONTIN) 300 MG capsule Take 900 mg by mouth See admin instructions. At 4 pm     HYDROcodone-acetaminophen (NORCO) 10-325 MG tablet Take 1 tablet by mouth every 6 (six) hours as needed.     hydrocortisone  2.5 % cream APPLY TOPICALLY TO THE AFFECTED AREA TWICE DAILY AS NEEDED 30 g 3   hydroxyurea (HYDREA) 500 MG capsule Take 2 capsules (1,000 mg total) by mouth daily. May take with food to minimize GI side effects. 180 capsule 1   isosorbide mononitrate (IMDUR) 120 MG 24 hr tablet Take 120 mg by mouth daily.      predniSONE (DELTASONE) 5 MG tablet Take 7.5 mg by mouth every morning.     ranolazine (RANEXA) 500 MG 12 hr tablet Take 500 mg by mouth in the morning and at bedtime.     rosuvastatin (CRESTOR) 20 MG tablet Take 20 mg by mouth at bedtime.     valsartan (DIOVAN) 160 MG tablet Take 1 tablet (160 mg total) by mouth daily. 90 tablet 4   augmented betamethasone dipropionate (DIPROLENE-AF) 0.05 % cream Apply topically 2 (two) times daily as needed. (Patient not taking: Reported on 08/04/2021)     nitroGLYCERIN (NITROSTAT) 0.4 MG SL tablet Place 0.4 mg under the tongue every 5 (five) minutes as needed for chest pain. (Patient not taking: Reported on 08/04/2021)     triamcinolone acetonide (KENALOG-40) 40 MG/ML injection (RADIOLOGY ONLY) Inject into the articular space. (Patient not taking: Reported on 11/05/2021)     No current facility-administered medications for this visit.     PHYSICAL EXAMINATION: ECOG PERFORMANCE STATUS: 1 - Symptomatic but completely ambulatory Today's Vitals   11/05/21 1220  BP: 137/69  Pulse: 67  Resp: 18  Temp: 98.1 F (36.7 C)  Weight: 213 lb 11.2 oz  (96.9 kg)  PainSc: 1    Body mass index is 28.98 kg/m.  Marland Kitchen Physical Exam Constitutional:      General: He is not in acute distress. HENT:     Head: Normocephalic and atraumatic.  Eyes:     General: No scleral icterus. Cardiovascular:     Rate and Rhythm: Normal rate and regular rhythm.     Heart sounds: Normal heart sounds.  Pulmonary:     Effort: Pulmonary effort is normal. No respiratory distress.     Breath sounds: No wheezing.  Abdominal:     General: Bowel sounds are normal. There is no distension.     Palpations: Abdomen is soft.  Musculoskeletal:        General: No deformity. Normal range of motion.     Cervical back: Normal range of motion and neck supple.  Skin:    General: Skin is warm and dry.  Neurological:     Mental Status: He is alert and oriented to person, place, and time. Mental status is at baseline.     Cranial Nerves: No cranial nerve deficit.     Coordination: Coordination normal.  Psychiatric:        Mood and Affect: Mood normal.     LABORATORY DATA:  I have reviewed the data as listed    Latest Ref Rng & Units 11/01/2021   11:11 AM 08/04/2021    1:43 PM 05/04/2021    1:05 PM  CBC  WBC 4.0 - 10.5 K/uL 7.7  8.7  8.5   Hemoglobin 13.0 - 17.0 g/dL 14.5  15.0  15.0   Hematocrit 39.0 - 52.0 % 41.5  43.9  44.5   Platelets 150 - 400 K/uL 187  159  155       Latest Ref Rng & Units 11/01/2021   11:11 AM 08/04/2021    1:43 PM 05/04/2021    1:05 PM  CMP  Glucose 70 -  99 mg/dL 95  92  91   BUN 8 - 23 mg/dL '11  10  12   '$ Creatinine 0.61 - 1.24 mg/dL 1.03  1.00  1.02   Sodium 135 - 145 mmol/L 128  130  134   Potassium 3.5 - 5.1 mmol/L 4.6  3.9  3.9   Chloride 98 - 111 mmol/L 95  97  100   CO2 22 - 32 mmol/L '30  30  29   '$ Calcium 8.9 - 10.3 mg/dL 8.4  7.9  8.3   Total Protein 6.5 - 8.1 g/dL 6.0  6.0  6.0   Total Bilirubin 0.3 - 1.2 mg/dL 1.3  1.0  1.1   Alkaline Phos 38 - 126 U/L '26  30  27   '$ AST 15 - 41 U/L '14  16  13   '$ ALT 0 - 44 U/L '14  13  13       '$ Iron/TIBC/Ferritin/ %Sat    Component Value Date/Time   IRON 49 09/20/2019 1201   TIBC 318 09/20/2019 1201   FERRITIN 24 09/20/2019 1201   IRONPCTSAT 15 (L) 09/20/2019 1201      RADIOGRAPHIC STUDIES: I have personally reviewed the radiological images as listed and agreed with the findings in the report. No results found.

## 2021-11-05 NOTE — Assessment & Plan Note (Signed)
Etiology unknown, likely due to decreased oral intake.  Encourage patient to increase oral hydration.

## 2021-11-05 NOTE — Assessment & Plan Note (Signed)
CT abdomen pelvis with contrast

## 2021-11-05 NOTE — Assessment & Plan Note (Signed)
continue Elqiuis 2.5mg BID. -Patient also takes aspirin 81 mg for CAD 

## 2021-11-05 NOTE — Assessment & Plan Note (Signed)
Repeat flowcytometry showed stable monoclonal lymphocytosis clone, <5000  

## 2021-11-05 NOTE — Telephone Encounter (Signed)
[facility] pt assistance application for Eliquis faxed to [phone].

## 2021-11-10 DIAGNOSIS — I25119 Atherosclerotic heart disease of native coronary artery with unspecified angina pectoris: Secondary | ICD-10-CM | POA: Diagnosis not present

## 2021-11-10 DIAGNOSIS — D45 Polycythemia vera: Secondary | ICD-10-CM | POA: Diagnosis not present

## 2021-11-10 DIAGNOSIS — I10 Essential (primary) hypertension: Secondary | ICD-10-CM | POA: Diagnosis not present

## 2021-11-10 DIAGNOSIS — I2699 Other pulmonary embolism without acute cor pulmonale: Secondary | ICD-10-CM | POA: Diagnosis not present

## 2021-11-11 DIAGNOSIS — G894 Chronic pain syndrome: Secondary | ICD-10-CM | POA: Diagnosis not present

## 2021-11-18 ENCOUNTER — Ambulatory Visit: Admission: RE | Admit: 2021-11-18 | Payer: Medicare Other | Source: Ambulatory Visit

## 2021-11-19 DIAGNOSIS — L570 Actinic keratosis: Secondary | ICD-10-CM | POA: Diagnosis not present

## 2021-11-19 DIAGNOSIS — L308 Other specified dermatitis: Secondary | ICD-10-CM | POA: Diagnosis not present

## 2021-11-19 DIAGNOSIS — Z85828 Personal history of other malignant neoplasm of skin: Secondary | ICD-10-CM | POA: Diagnosis not present

## 2021-12-06 DIAGNOSIS — L4050 Arthropathic psoriasis, unspecified: Secondary | ICD-10-CM | POA: Diagnosis not present

## 2021-12-14 DIAGNOSIS — I2699 Other pulmonary embolism without acute cor pulmonale: Secondary | ICD-10-CM | POA: Diagnosis not present

## 2021-12-14 DIAGNOSIS — Z7901 Long term (current) use of anticoagulants: Secondary | ICD-10-CM | POA: Diagnosis not present

## 2021-12-14 DIAGNOSIS — I25119 Atherosclerotic heart disease of native coronary artery with unspecified angina pectoris: Secondary | ICD-10-CM | POA: Diagnosis not present

## 2021-12-14 DIAGNOSIS — D45 Polycythemia vera: Secondary | ICD-10-CM | POA: Diagnosis not present

## 2021-12-15 DIAGNOSIS — Z79899 Other long term (current) drug therapy: Secondary | ICD-10-CM | POA: Diagnosis not present

## 2021-12-15 DIAGNOSIS — L4050 Arthropathic psoriasis, unspecified: Secondary | ICD-10-CM | POA: Diagnosis not present

## 2021-12-18 IMAGING — CT CT BIOPSY AND ASPIRATION BONE MARROW
1 of 2 series · 8 of 14 positions shown, 10 images · non-contrast
Comparison: none

INDICATION: Poly cytosis. Please perform CT-guided bone marrow biopsy for tissue
diagnostic purposes.

[Series 2: i-spiral 5.0 b30f · axial · 0.88mm/px · z∈[-348,-253]mm · 8 of 35 slices shown, 10 images]
[im 4/35  soft-tissue]
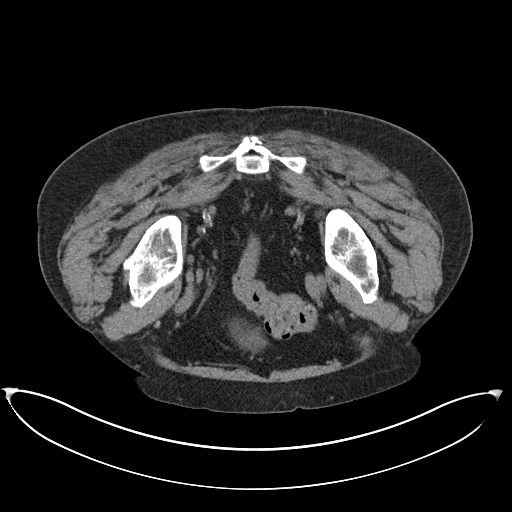
[im 4/35  bone]
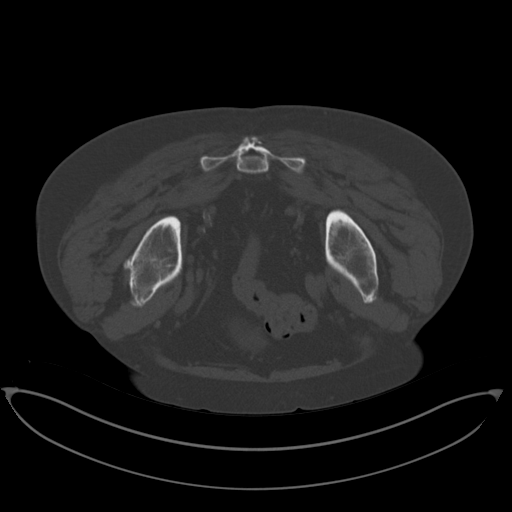
[im 8/35  bone]
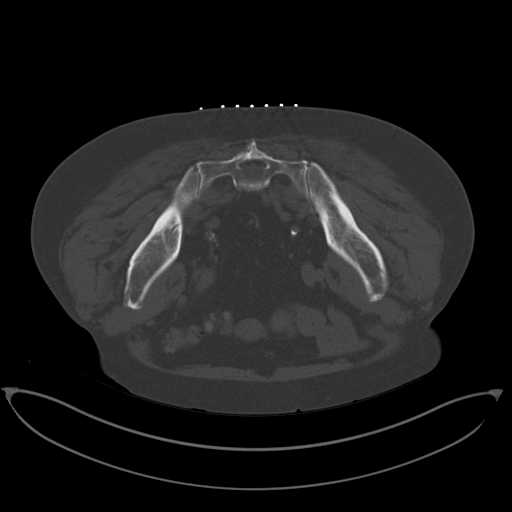
[im 12/35  bone]
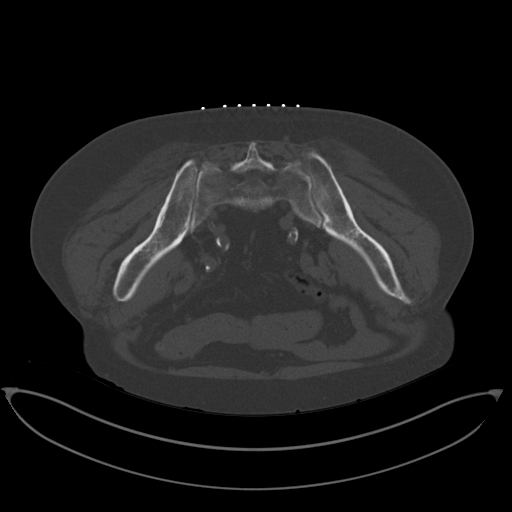
[im 16/35  bone]
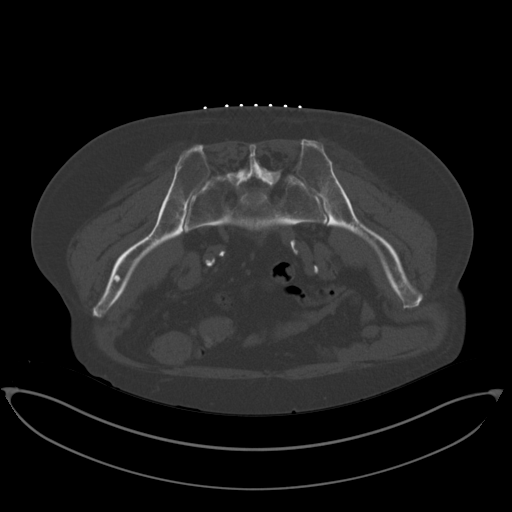
[im 19/35  soft-tissue]
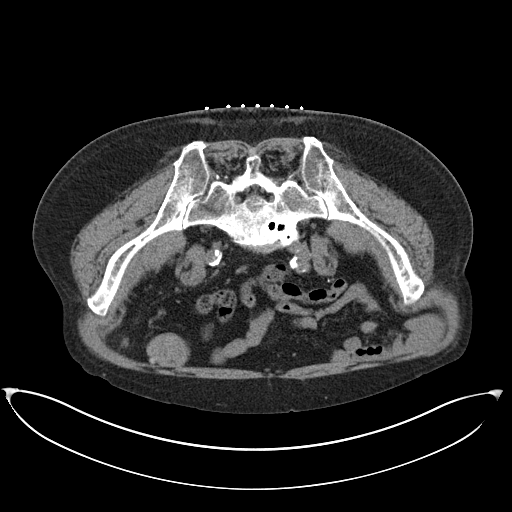
[im 19/35  bone]
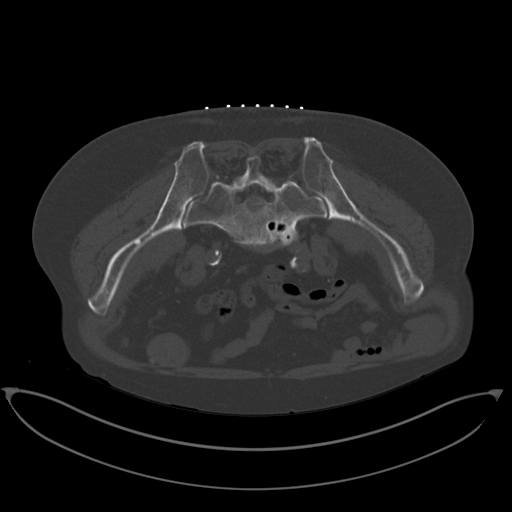
[im 23/35  bone]
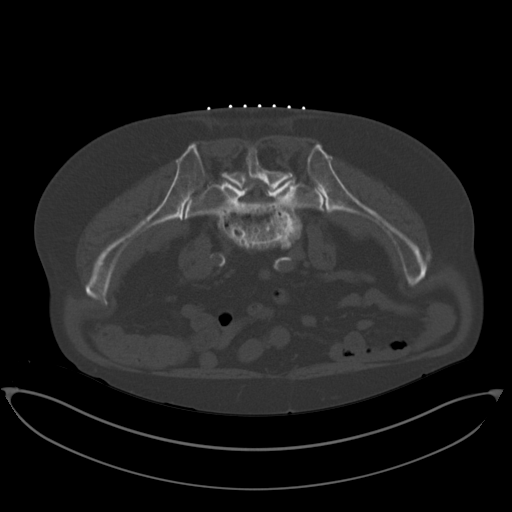
[im 27/35  bone]
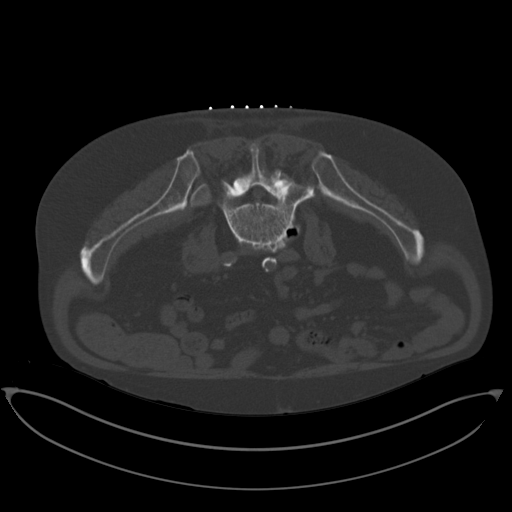
[im 31/35  bone]
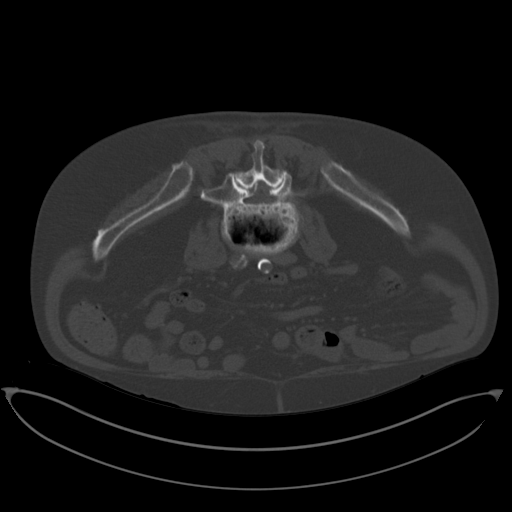

[8 of 14 positions shown; findings below may reference images not displayed]

EXAM:
CT-GUIDED BONE MARROW BIOPSY AND ASPIRATION

MEDICATIONS:
None

ANESTHESIA/SEDATION:
Fentanyl 100 mcg IV; Versed 2 mg IV

Sedation Time: 10 Minutes; The patient was continuously monitored
during the procedure by the interventional radiology nurse under my
direct supervision.

COMPLICATIONS:
None immediate.

PROCEDURE:
Informed consent was obtained from the patient following an
explanation of the procedure, risks, benefits and alternatives. The
patient understands, agrees and consents for the procedure. All
questions were addressed. A time out was performed prior to the
initiation of the procedure.

The patient was positioned prone and non-contrast localization CT
was performed of the pelvis to demonstrate the iliac marrow spaces.
The operative site was prepped and draped in the usual sterile
fashion.

Under sterile conditions and local anesthesia, a 22 gauge spinal
needle was utilized for procedural planning. Next, an 11 gauge
coaxial bone biopsy needle was advanced into the left iliac marrow
space. Needle position was confirmed with CT imaging. Initially, a
bone marrow aspiration was performed. Next, a bone marrow biopsy was
obtained with the 11 gauge outer bone marrow device. The 11 gauge
coaxial bone biopsy needle was re-advanced into a slightly different
location within the left iliac marrow space, positioning was
confirmed with CT imaging and an additional bone marrow biopsy was
obtained. The needle was removed and superficial hemostasis was
obtained with manual compression. A dressing was applied. The
patient tolerated the procedure well without immediate post
procedural complication.
IMPRESSION: Successful CT guided left iliac bone marrow aspiration and core
biopsy.

## 2021-12-20 DIAGNOSIS — L570 Actinic keratosis: Secondary | ICD-10-CM | POA: Diagnosis not present

## 2021-12-20 DIAGNOSIS — D044 Carcinoma in situ of skin of scalp and neck: Secondary | ICD-10-CM | POA: Diagnosis not present

## 2021-12-20 DIAGNOSIS — Z85828 Personal history of other malignant neoplasm of skin: Secondary | ICD-10-CM | POA: Diagnosis not present

## 2021-12-20 DIAGNOSIS — D224 Melanocytic nevi of scalp and neck: Secondary | ICD-10-CM | POA: Diagnosis not present

## 2021-12-20 DIAGNOSIS — L821 Other seborrheic keratosis: Secondary | ICD-10-CM | POA: Diagnosis not present

## 2022-01-17 ENCOUNTER — Other Ambulatory Visit: Payer: Self-pay | Admitting: Oncology

## 2022-01-24 ENCOUNTER — Other Ambulatory Visit: Payer: Self-pay

## 2022-01-24 DIAGNOSIS — R634 Abnormal weight loss: Secondary | ICD-10-CM

## 2022-01-24 DIAGNOSIS — D45 Polycythemia vera: Secondary | ICD-10-CM

## 2022-01-25 ENCOUNTER — Encounter: Payer: Self-pay | Admitting: Oncology

## 2022-01-25 ENCOUNTER — Inpatient Hospital Stay: Payer: Medicare Other | Attending: Oncology

## 2022-01-25 ENCOUNTER — Inpatient Hospital Stay: Payer: Medicare Other | Admitting: Oncology

## 2022-01-25 ENCOUNTER — Ambulatory Visit
Admission: RE | Admit: 2022-01-25 | Discharge: 2022-01-25 | Disposition: A | Discharge status: Home / Self Care | Payer: Medicare Other | Source: Ambulatory Visit | Attending: Oncology | Admitting: Oncology

## 2022-01-25 VITALS — BP 158/73 | HR 62 | Temp 98.7°F | Resp 18 | Wt 213.9 lb

## 2022-01-25 DIAGNOSIS — D72820 Lymphocytosis (symptomatic): Secondary | ICD-10-CM | POA: Insufficient documentation

## 2022-01-25 DIAGNOSIS — I2510 Atherosclerotic heart disease of native coronary artery without angina pectoris: Secondary | ICD-10-CM | POA: Insufficient documentation

## 2022-01-25 DIAGNOSIS — R634 Abnormal weight loss: Secondary | ICD-10-CM

## 2022-01-25 DIAGNOSIS — Z7982 Long term (current) use of aspirin: Secondary | ICD-10-CM | POA: Diagnosis not present

## 2022-01-25 DIAGNOSIS — D45 Polycythemia vera: Secondary | ICD-10-CM | POA: Insufficient documentation

## 2022-01-25 DIAGNOSIS — Z86711 Personal history of pulmonary embolism: Secondary | ICD-10-CM | POA: Diagnosis not present

## 2022-01-25 DIAGNOSIS — Z7901 Long term (current) use of anticoagulants: Secondary | ICD-10-CM | POA: Diagnosis not present

## 2022-01-25 DIAGNOSIS — E871 Hypo-osmolality and hyponatremia: Secondary | ICD-10-CM | POA: Insufficient documentation

## 2022-01-25 DIAGNOSIS — Z6829 Body mass index (BMI) 29.0-29.9, adult: Secondary | ICD-10-CM | POA: Diagnosis not present

## 2022-01-25 LAB — COMPREHENSIVE METABOLIC PANEL
ALT: 11 U/L (ref 0–44)
AST: 15 U/L (ref 15–41)
Albumin: 3.8 g/dL (ref 3.5–5.0)
Alkaline Phosphatase: 28 U/L — ABNORMAL LOW (ref 38–126)
Anion gap: 7 (ref 5–15)
BUN: 12 mg/dL (ref 8–23)
CO2: 28 mmol/L (ref 22–32)
Calcium: 8.3 mg/dL — ABNORMAL LOW (ref 8.9–10.3)
Chloride: 94 mmol/L — ABNORMAL LOW (ref 98–111)
Creatinine, Ser: 1 mg/dL (ref 0.61–1.24)
GFR, Estimated: >60 mL/min (ref >60)
Glucose, Bld: 78 mg/dL (ref 70–99)
Potassium: 3.9 mmol/L (ref 3.5–5.1)
Sodium: 129 mmol/L — ABNORMAL LOW (ref 135–145)
Total Bilirubin: 1.4 mg/dL — ABNORMAL HIGH (ref 0.3–1.2)
Total Protein: 6.2 g/dL — ABNORMAL LOW (ref 6.5–8.1)

## 2022-01-25 LAB — CBC WITH DIFFERENTIAL/PLATELET
Abs Immature Granulocytes: 0.11 10*3/uL — ABNORMAL HIGH (ref 0.00–0.07)
Basophils Absolute: 0.1 10*3/uL (ref 0.0–0.1)
Basophils Relative: 1 %
Eosinophils Absolute: 0.1 10*3/uL (ref 0.0–0.5)
Eosinophils Relative: 1 %
HCT: 43 % (ref 39.0–52.0)
Hemoglobin: 15.1 g/dL (ref 13.0–17.0)
Immature Granulocytes: 1 %
Lymphocytes Relative: 36 %
Lymphs Abs: 3.2 10*3/uL (ref 0.7–4.0)
MCH: 40.1 pg — ABNORMAL HIGH (ref 26.0–34.0)
MCHC: 35.1 g/dL (ref 30.0–36.0)
MCV: 114.1 fL — ABNORMAL HIGH (ref 80.0–100.0)
Monocytes Absolute: 0.7 10*3/uL (ref 0.1–1.0)
Monocytes Relative: 8 %
Neutro Abs: 4.6 10*3/uL (ref 1.7–7.7)
Neutrophils Relative %: 53 %
Platelets: 151 10*3/uL (ref 150–400)
RBC: 3.77 MIL/uL — ABNORMAL LOW (ref 4.22–5.81)
RDW: 14.6 % (ref 11.5–15.5)
WBC: 8.7 10*3/uL (ref 4.0–10.5)
nRBC: 0 % (ref 0.0–0.2)

## 2022-01-26 ENCOUNTER — Encounter: Payer: Self-pay | Admitting: Oncology

## 2022-01-26 NOTE — Assessment & Plan Note (Addendum)
JAK2 V617F mutation. Labs reviewed and discussed with patient.stable counts Recommend patient to continue hydroxyurea 1000 mg daily.

## 2022-01-26 NOTE — Assessment & Plan Note (Signed)
Repeat flowcytometry showed stable monoclonal lymphocytosis clone, <5000

## 2022-01-26 NOTE — Assessment & Plan Note (Signed)
continue Elqiuis 2.'5mg'$  BID. -Patient also takes aspirin 81 mg for CAD

## 2022-01-26 NOTE — Assessment & Plan Note (Addendum)
He did not get CT abdomen pelvis with contrast done due to improvement of his symptoms.  Monitor Check CXR

## 2022-01-26 NOTE — Progress Notes (Signed)
Hematology/Oncology Progress note [phone] [phone]      Patient Care Team: Birdie Sons, MD as PCP - General (Family Medicine) Lorelee Cover., MD as Consulting Physician (Ophthalmology) Quintin Alto, MD as Referring Physician (Rheumatology) Dorene Ar, MD (Pain Medicine) Ubaldo Glassing Javier Docker, MD as Consulting Physician (Cardiology) Earlie Server, MD as Consulting Physician (Oncology)   ASSESSMENT & PLAN:   Polycythemia vera ([place]) JAK2 V617F mutation. Labs reviewed and discussed with patient.stable counts Recommend patient to continue hydroxyurea 1000 mg daily.  History of pulmonary embolism continue Elqiuis 2.'5mg'$  BID. -Patient also takes aspirin 81 mg for CAD  Monoclonal B-cell lymphocytosis of undetermined significance Repeat flowcytometry showed stable monoclonal lymphocytosis clone, <5000   Unintentional weight loss He did not get CT abdomen pelvis with contrast done due to improvement of his symptoms.  Monitor Check CXR  Orders Placed This Encounter  Procedures   DG Chest 2 View    Standing Status:   Future    Number of Occurrences:   1    Standing Expiration Date:   01/25/2023    Order Specific Question:   Reason for Exam (SYMPTOM  OR DIAGNOSIS REQUIRED)    Answer:   Hyponatremia    Order Specific Question:   Preferred imaging location?    Answer:    [facility]   CBC with Differential/Platelet    Standing Status:   Future    Standing Expiration Date:   01/25/2023   Comprehensive metabolic panel    Standing Status:   Future    Standing Expiration Date:   01/25/2023   Follow up TBD All questions were answered. The patient knows to call the clinic with any problems, questions or concerns.  Earlie Server, MD, PhD [facility] Hematology Oncology 01/25/2022    CHIEF COMPLAINTS/REASON FOR VISIT:  Follow-up for polycythemia vera-Jak [id number] mutation  HISTORY OF PRESENTING ILLNESS:   Edward Mejia is a  76 y.o.  male with  PMH including rheumatoid arthritis, peripheral vascular disease hypertension, Covid infection history,, was seen in consultation at the request of  No ref. provider found  for evaluation of polycytosis Patient was accompanied by her wife who participated in providing medical history. February 2021, patient was hospitalized due to COVID-19 pneumonia, respiratory failure, treated with steroids, remdesivir, received bamlanivimab.  Hospitalization was also completed by [facility], bleeding was  thought to be secondary to hemorrhoids.  He was discharged on 04/07/2019 and presented back to emergency room on 04/12/2019 due to left lower extremity swelling, acute hypoxic respiratory failure.  Work-up showed acute bilateral pulmonary embolism, large left lower extremity DVT, thrombosis and IVC and left external iliac vein.  Patient was started on anticoagulation and transition to Eliquis Patient has been on Eliquis since March 2021.  Patient also takes aspirin.  Patient has history of CAD, follows up with Dr. Ubaldo Glassing.  Rheumatoid arthritis on Enbrel once a week.  Also on prednisone 7.5 mg daily. Patient has also been found to have erythrocytosis. 09/20/2019, CBC showed hemoglobin of 17.6, reviewed previous medical records, erythrocytosis can be traced back to at least 2017.  Patient also has leukocytosis since March 2021.  Leukocytosis has trended down recently and CBC on 09/20/2019 showed a white count of 10.8, predominantly monocytosis.  Patient is a former smoker, 10-12-pack-year smoking history and he stopped smoking in 1984. Currently he has some chronic shortness of breath with exertion.  He has lost weight during his previous hospitalization and has gained weight since discharge.  Sometimes he feels  sweaty.  Patient also reports intermittent dizziness/lightheaded episodes for the past couple of months.  He actually had an episode during today's visit and is spontaneously resolved after few minutes.  Denies any shortness  of breath, chest pain, vision changes, focal deficits.  Patient has been referred to establish care with neurology for further evaluation.  #  psoriatic arthritis follows up with [facility] Dr. Abner Greenspan.on chronic prednisone.  # skin squamous cell carcinoma removal from his scalp. #09/27/2021 He recently underwent cardiac cath for abnormal stress test and unstable angina s/p drug eluting coronary stent placement.   INTERVAL HISTORY Tymel Conely is a 76 y.o. male who has above history reviewed by me today presents for follow up visit for management of polycythemia vera Patient has been on Eliquis 2.5 mg twice daily.  Takes hydroxyurea '1000mg'$  daily.  Appetite has improve and no additional weight loss No night sweats, fever.    .Review of Systems  Constitutional:  Positive for fatigue. Negative for appetite change, chills, fever and unexpected weight change.  HENT:   Negative for hearing loss and voice change.   Eyes:  Negative for eye problems and icterus.  Respiratory:  Negative for chest tightness, cough and shortness of breath.   Cardiovascular:  Negative for chest pain and leg swelling.  Gastrointestinal:  Negative for abdominal distention, abdominal pain and blood in stool.  Endocrine: Negative for hot flashes.  Genitourinary:  Negative for difficulty urinating, dysuria and frequency.   Musculoskeletal:  Negative for arthralgias.  Skin:  Negative for itching and rash.  Neurological:  Negative for extremity weakness, light-headedness and numbness.  Hematological:  Negative for adenopathy. Does not bruise/bleed easily.  Psychiatric/Behavioral:  Negative for confusion.     MEDICAL HISTORY:  Past Medical History:  Diagnosis Date   Arthritis    Bilateral pulmonary embolism ([place]) 03/2019   in setting of Covid-19 pneumonia   Cancer ([place])    skin cancer   Cellulitis    Cellulitis    DVT (deep venous thrombosis) ([place]) 03/2019   in setting of Covid-19 pneumonia   Erythrocytosis  09/20/2019   H/O adenomatous polyp of colon 03/24/2015   HCAP (healthcare-associated pneumonia) 04/12/2019   History of brain disorder: history of amaurosis fugax 03/24/2015   History of pneumonia 04/04/2008   History of rheumatic fever 03/24/2015   DID Have Rheumatic Fever.     Hypertension    Left leg DVT ([place]) 04/12/2019   In setting of Covid-19 started anticoagulation 04-12-2019. Subsequently Dx PCV and long term NOAC per hematology    Peptic ulcer disease    Sepsis ([place]) 04/12/2019   secondary to Covid pneumonia    SURGICAL HISTORY: Past Surgical History:  Procedure Laterality Date   APPENDECTOMY     LEFT HEART CATH AND CORONARY ANGIOGRAPHY Left 02/06/2018   Procedure: LEFT HEART CATH AND CORONARY ANGIOGRAPHY;  Surgeon: Teodoro Spray, MD;  Location: [facility];  Service: Cardiovascular;  Laterality: Left;   MANDIBLE FRACTURE SURGERY     fractured jaw   PARTIAL GASTRECTOMY     peptic ulcer disease    SOCIAL HISTORY: Social History   Socioeconomic History   Marital status: Married    Spouse name: Lovey Newcomer    Number of children: 4   Years of education: Not on file   Highest education level: 8th grade  Occupational History   Occupation: Disabled    Comment: due to Psoriatic Arthritis  Tobacco Use   Smoking status: Former    Types:  Cigarettes    Quit date: 09/20/1982    Years since quitting: 39.3   Smokeless tobacco: Never   Tobacco comments:    quit over 40+ years ago  Vaping Use   Vaping Use: Never used  Substance and Sexual Activity   Alcohol use: No    Alcohol/week: 0.0 standard drinks of alcohol   Drug use: No   Sexual activity: Not on file  Other Topics Concern   Not on file  Social History Narrative   Not on file   Social Determinants of Health   Financial Resource Strain: Low Risk  (01/08/2019)   Overall Financial Resource Strain (CARDIA)    Difficulty of Paying Living Expenses: Not hard at all  Food Insecurity: No Food Insecurity (02/06/2018)    Hunger Vital Sign    Worried About Running Out of Food in the Last Year: Never true    Ran Out of Food in the Last Year: Never true  Transportation Needs: No Transportation Needs (02/06/2018)   PRAPARE - Hydrologist (Medical): No    Lack of Transportation (Non-Medical): No  Physical Activity: Unknown (01/08/2019)   Exercise Vital Sign    Days of Exercise per Week: Not on file    Minutes of Exercise per Session: 0 min  Stress: No Stress Concern Present (01/08/2019)   Carl    Feeling of Stress : Not at all  Social Connections: Unknown (01/08/2019)   Social Connection and Isolation Panel [NHANES]    Frequency of Communication with Friends and Family: Patient refused    Frequency of Social Gatherings with Friends and Family: Patient refused    Attends Religious Services: Patient refused    Active Member of Clubs or Organizations: Patient refused    Attends Archivist Meetings: Patient refused    Marital Status: Patient refused  Intimate Partner Violence: Unknown (01/08/2019)   Humiliation, Afraid, Rape, and Kick questionnaire    Fear of Current or Ex-Partner: Patient refused    Emotionally Abused: Patient refused    Physically Abused: Patient refused    Sexually Abused: Patient refused    FAMILY HISTORY: Family History  Problem Relation Age of Onset   Cancer Mother        lung cancer   Heart disease Father 36       heart attack   Cancer Brother        pancreatic cancer   Cancer Brother        pancreatic cancer    ALLERGIES:  is allergic to pravachol [pravastatin].  MEDICATIONS:  Current Outpatient Medications  Medication Sig Dispense Refill   acetaminophen (TYLENOL) 500 MG tablet Take 1,000 mg by mouth 3 (three) times daily as needed for mild pain or headache.      albuterol (VENTOLIN HFA) 108 (90 Base) MCG/ACT inhaler Inhale 2 puffs into the lungs every 6 (six)  hours as needed for wheezing or shortness of breath.     apixaban (ELIQUIS) 2.5 MG TABS tablet Take 1 tablet (2.5 mg total) by mouth 2 (two) times daily. 180 tablet 2   augmented betamethasone dipropionate (DIPROLENE-AF) 0.05 % cream Apply topically 2 (two) times daily as needed.     clopidogrel (PLAVIX) 75 MG tablet Take 75 mg by mouth daily.     etanercept (ENBREL) 50 MG/ML injection Inject 50 mg into the skin every Thursday.      gabapentin (NEURONTIN) 300 MG capsule Take 900 mg by  mouth See admin instructions. At 4 pm     HYDROcodone-acetaminophen (NORCO) 10-325 MG tablet Take 1 tablet by mouth every 6 (six) hours as needed.     hydrocortisone 2.5 % cream APPLY TOPICALLY TO THE AFFECTED AREA TWICE DAILY AS NEEDED 30 g 3   hydroxyurea (HYDREA) 500 MG capsule TAKE 2 CAPSULES(1000 MG) BY MOUTH DAILY 180 capsule 1   isosorbide mononitrate (IMDUR) 120 MG 24 hr tablet Take 120 mg by mouth daily.      predniSONE (DELTASONE) 5 MG tablet Take 7.5 mg by mouth every morning.     ranolazine (RANEXA) 500 MG 12 hr tablet Take 500 mg by mouth in the morning and at bedtime.     triamcinolone acetonide (KENALOG-40) 40 MG/ML injection (RADIOLOGY ONLY) Inject into the articular space.     valsartan (DIOVAN) 160 MG tablet Take 1 tablet (160 mg total) by mouth daily. 90 tablet 4   nitroGLYCERIN (NITROSTAT) 0.4 MG SL tablet Place 0.4 mg under the tongue every 5 (five) minutes as needed for chest pain. (Patient not taking: Reported on 08/04/2021)     No current facility-administered medications for this visit.     PHYSICAL EXAMINATION: ECOG PERFORMANCE STATUS: 1 - Symptomatic but completely ambulatory Today's Vitals   01/25/22 1350  BP: (!) 158/73  Pulse: 62  Resp: 18  Temp: 98.7 F (37.1 C)  Weight: 213 lb 14.4 oz (97 kg)  PainSc: 0-No pain   Body mass index is 29.01 kg/m.  Marland Kitchen Physical Exam Constitutional:      General: He is not in acute distress. HENT:     Head: Normocephalic and atraumatic.   Eyes:     General: No scleral icterus. Cardiovascular:     Rate and Rhythm: Normal rate and regular rhythm.     Heart sounds: Normal heart sounds.  Pulmonary:     Effort: Pulmonary effort is normal. No respiratory distress.     Breath sounds: No wheezing.  Abdominal:     General: Bowel sounds are normal. There is no distension.     Palpations: Abdomen is soft.  Musculoskeletal:        General: No deformity. Normal range of motion.     Cervical back: Normal range of motion and neck supple.  Skin:    General: Skin is warm and dry.  Neurological:     Mental Status: He is alert and oriented to person, place, and time. Mental status is at baseline.     Cranial Nerves: No cranial nerve deficit.     Coordination: Coordination normal.  Psychiatric:        Mood and Affect: Mood normal.     LABORATORY DATA:  I have reviewed the data as listed    Latest Ref Rng & Units 01/25/2022    1:02 PM 11/01/2021   11:11 AM 08/04/2021    1:43 PM  CBC  WBC 4.0 - 10.5 K/uL 8.7  7.7  8.7   Hemoglobin 13.0 - 17.0 g/dL 15.1  14.5  15.0   Hematocrit 39.0 - 52.0 % 43.0  41.5  43.9   Platelets 150 - 400 K/uL 151  187  159       Latest Ref Rng & Units 01/25/2022    1:02 PM 11/01/2021   11:11 AM 08/04/2021    1:43 PM  CMP  Glucose 70 - 99 mg/dL 78  95  92   BUN 8 - 23 mg/dL '12  11  10   '$ Creatinine 0.61 - 1.24  mg/dL 1.00  1.03  1.00   Sodium 135 - 145 mmol/L 129  128  130   Potassium 3.5 - 5.1 mmol/L 3.9  4.6  3.9   Chloride 98 - 111 mmol/L 94  95  97   CO2 22 - 32 mmol/L '28  30  30   '$ Calcium 8.9 - 10.3 mg/dL 8.3  8.4  7.9   Total Protein 6.5 - 8.1 g/dL 6.2  6.0  6.0   Total Bilirubin 0.3 - 1.2 mg/dL 1.4  1.3  1.0   Alkaline Phos 38 - 126 U/L '28  26  30   '$ AST 15 - 41 U/L '15  14  16   '$ ALT 0 - 44 U/L '11  14  13      '$ Iron/TIBC/Ferritin/ %Sat    Component Value Date/Time   IRON 49 09/20/2019 1201   TIBC 318 09/20/2019 1201   FERRITIN 24 09/20/2019 1201   IRONPCTSAT 15 (L) 09/20/2019 1201       RADIOGRAPHIC STUDIES: I have personally reviewed the radiological images as listed and agreed with the findings in the report. No results found.

## 2022-02-11 DIAGNOSIS — M47816 Spondylosis without myelopathy or radiculopathy, lumbar region: Secondary | ICD-10-CM | POA: Diagnosis not present

## 2022-02-11 DIAGNOSIS — M4807 Spinal stenosis, lumbosacral region: Secondary | ICD-10-CM | POA: Diagnosis not present

## 2022-02-11 DIAGNOSIS — G894 Chronic pain syndrome: Secondary | ICD-10-CM | POA: Diagnosis not present

## 2022-02-11 DIAGNOSIS — M5136 Other intervertebral disc degeneration, lumbar region: Secondary | ICD-10-CM | POA: Diagnosis not present

## 2022-03-11 ENCOUNTER — Other Ambulatory Visit: Payer: Self-pay | Admitting: *Deleted

## 2022-03-11 DIAGNOSIS — E7800 Pure hypercholesterolemia, unspecified: Secondary | ICD-10-CM

## 2022-03-22 DIAGNOSIS — Z85828 Personal history of other malignant neoplasm of skin: Secondary | ICD-10-CM | POA: Diagnosis not present

## 2022-03-22 DIAGNOSIS — L570 Actinic keratosis: Secondary | ICD-10-CM | POA: Diagnosis not present

## 2022-03-22 DIAGNOSIS — C44329 Squamous cell carcinoma of skin of other parts of face: Secondary | ICD-10-CM | POA: Diagnosis not present

## 2022-03-22 DIAGNOSIS — L308 Other specified dermatitis: Secondary | ICD-10-CM | POA: Diagnosis not present

## 2022-03-22 DIAGNOSIS — C44622 Squamous cell carcinoma of skin of right upper limb, including shoulder: Secondary | ICD-10-CM | POA: Diagnosis not present

## 2022-03-27 ENCOUNTER — Encounter: Payer: Self-pay | Admitting: Oncology

## 2022-04-12 DIAGNOSIS — I25119 Atherosclerotic heart disease of native coronary artery with unspecified angina pectoris: Secondary | ICD-10-CM | POA: Diagnosis not present

## 2022-04-12 DIAGNOSIS — Z23 Encounter for immunization: Secondary | ICD-10-CM | POA: Diagnosis not present

## 2022-04-12 DIAGNOSIS — I2699 Other pulmonary embolism without acute cor pulmonale: Secondary | ICD-10-CM | POA: Diagnosis not present

## 2022-04-12 DIAGNOSIS — I10 Essential (primary) hypertension: Secondary | ICD-10-CM | POA: Diagnosis not present

## 2022-04-25 ENCOUNTER — Inpatient Hospital Stay: Payer: Medicare Other | Attending: Oncology

## 2022-04-25 DIAGNOSIS — Z8616 Personal history of COVID-19: Secondary | ICD-10-CM | POA: Insufficient documentation

## 2022-04-25 DIAGNOSIS — Z7982 Long term (current) use of aspirin: Secondary | ICD-10-CM | POA: Diagnosis not present

## 2022-04-25 DIAGNOSIS — I739 Peripheral vascular disease, unspecified: Secondary | ICD-10-CM | POA: Insufficient documentation

## 2022-04-25 DIAGNOSIS — M069 Rheumatoid arthritis, unspecified: Secondary | ICD-10-CM | POA: Diagnosis not present

## 2022-04-25 DIAGNOSIS — D72820 Lymphocytosis (symptomatic): Secondary | ICD-10-CM | POA: Diagnosis not present

## 2022-04-25 DIAGNOSIS — D45 Polycythemia vera: Secondary | ICD-10-CM | POA: Insufficient documentation

## 2022-04-25 DIAGNOSIS — Z7901 Long term (current) use of anticoagulants: Secondary | ICD-10-CM | POA: Diagnosis not present

## 2022-04-25 DIAGNOSIS — Z87891 Personal history of nicotine dependence: Secondary | ICD-10-CM | POA: Insufficient documentation

## 2022-04-25 DIAGNOSIS — Z86711 Personal history of pulmonary embolism: Secondary | ICD-10-CM | POA: Insufficient documentation

## 2022-04-25 DIAGNOSIS — Z79899 Other long term (current) drug therapy: Secondary | ICD-10-CM | POA: Diagnosis not present

## 2022-04-25 DIAGNOSIS — E871 Hypo-osmolality and hyponatremia: Secondary | ICD-10-CM

## 2022-04-25 LAB — CBC WITH DIFFERENTIAL/PLATELET
Abs Immature Granulocytes: 0.09 10*3/uL — ABNORMAL HIGH (ref 0.00–0.07)
Basophils Absolute: 0.1 10*3/uL (ref 0.0–0.1)
Basophils Relative: 1 %
Eosinophils Absolute: 0.1 10*3/uL (ref 0.0–0.5)
Eosinophils Relative: 1 %
HCT: 43.2 % (ref 39.0–52.0)
Hemoglobin: 15.1 g/dL (ref 13.0–17.0)
Immature Granulocytes: 1 %
Lymphocytes Relative: 38 %
Lymphs Abs: 3.2 10*3/uL (ref 0.7–4.0)
MCH: 41.5 pg — ABNORMAL HIGH (ref 26.0–34.0)
MCHC: 35 g/dL (ref 30.0–36.0)
MCV: 118.7 fL — ABNORMAL HIGH (ref 80.0–100.0)
Monocytes Absolute: 0.8 10*3/uL (ref 0.1–1.0)
Monocytes Relative: 9 %
Neutro Abs: 4.4 10*3/uL (ref 1.7–7.7)
Neutrophils Relative %: 50 %
Platelets: 131 10*3/uL — ABNORMAL LOW (ref 150–400)
RBC: 3.64 MIL/uL — ABNORMAL LOW (ref 4.22–5.81)
RDW: 13.2 % (ref 11.5–15.5)
WBC: 8.6 10*3/uL (ref 4.0–10.5)
nRBC: 0 % (ref 0.0–0.2)

## 2022-04-25 LAB — COMPREHENSIVE METABOLIC PANEL
ALT: 11 U/L (ref 0–44)
AST: 14 U/L — ABNORMAL LOW (ref 15–41)
Albumin: 3.9 g/dL (ref 3.5–5.0)
Alkaline Phosphatase: 28 U/L — ABNORMAL LOW (ref 38–126)
Anion gap: 7 (ref 5–15)
BUN: 14 mg/dL (ref 8–23)
CO2: 28 mmol/L (ref 22–32)
Calcium: 8.7 mg/dL — ABNORMAL LOW (ref 8.9–10.3)
Chloride: 95 mmol/L — ABNORMAL LOW (ref 98–111)
Creatinine, Ser: 1.01 mg/dL (ref 0.61–1.24)
GFR, Estimated: >60 mL/min (ref >60)
Glucose, Bld: 92 mg/dL (ref 70–99)
Potassium: 4.3 mmol/L (ref 3.5–5.1)
Sodium: 130 mmol/L — ABNORMAL LOW (ref 135–145)
Total Bilirubin: 1.6 mg/dL — ABNORMAL HIGH (ref 0.3–1.2)
Total Protein: 6.4 g/dL — ABNORMAL LOW (ref 6.5–8.1)

## 2022-04-26 ENCOUNTER — Inpatient Hospital Stay: Payer: Medicare Other

## 2022-04-26 ENCOUNTER — Encounter: Payer: Self-pay | Admitting: Oncology

## 2022-04-26 ENCOUNTER — Inpatient Hospital Stay (HOSPITAL_BASED_OUTPATIENT_CLINIC_OR_DEPARTMENT_OTHER): Payer: Medicare Other | Admitting: Oncology

## 2022-04-26 VITALS — BP 136/77 | HR 79 | Temp 97.1°F | Resp 18 | Wt 211.2 lb

## 2022-04-26 DIAGNOSIS — Z7982 Long term (current) use of aspirin: Secondary | ICD-10-CM | POA: Diagnosis not present

## 2022-04-26 DIAGNOSIS — Z86711 Personal history of pulmonary embolism: Secondary | ICD-10-CM | POA: Diagnosis not present

## 2022-04-26 DIAGNOSIS — M069 Rheumatoid arthritis, unspecified: Secondary | ICD-10-CM | POA: Diagnosis not present

## 2022-04-26 DIAGNOSIS — D72820 Lymphocytosis (symptomatic): Secondary | ICD-10-CM | POA: Diagnosis not present

## 2022-04-26 DIAGNOSIS — D45 Polycythemia vera: Secondary | ICD-10-CM | POA: Diagnosis not present

## 2022-04-26 DIAGNOSIS — Z7901 Long term (current) use of anticoagulants: Secondary | ICD-10-CM | POA: Diagnosis not present

## 2022-04-26 DIAGNOSIS — Z79899 Other long term (current) drug therapy: Secondary | ICD-10-CM | POA: Diagnosis not present

## 2022-04-26 DIAGNOSIS — I739 Peripheral vascular disease, unspecified: Secondary | ICD-10-CM | POA: Diagnosis not present

## 2022-04-26 DIAGNOSIS — Z87891 Personal history of nicotine dependence: Secondary | ICD-10-CM | POA: Diagnosis not present

## 2022-04-26 DIAGNOSIS — Z8616 Personal history of COVID-19: Secondary | ICD-10-CM | POA: Diagnosis not present

## 2022-04-26 NOTE — Assessment & Plan Note (Signed)
continue Elqiuis 2.5mg BID. -Patient also takes aspirin 81 mg for CAD 

## 2022-04-26 NOTE — Assessment & Plan Note (Signed)
JAK2 V617F mutation. Labs reviewed and discussed with patient. Slight decrease in platelet counts. Recommend hydroxyurea 500mg  on Sundays, and 1000 mg daily for the rest of the week.

## 2022-04-26 NOTE — Assessment & Plan Note (Addendum)
flowcytometry showed stable monoclonal lymphocytosis clone, <5000 Stable white count.  Continue observation

## 2022-04-26 NOTE — Progress Notes (Signed)
Hematology/Oncology Progress note [phone] [phone]) [phone]     CHIEF COMPLAINTS/REASON FOR VISIT:  Follow-up for polycythemia vera-Jak [id number] mutation  ASSESSMENT & PLAN:   Polycythemia vera (Edward Mejia) JAK2 V617F mutation. Labs reviewed and discussed with patient. Slight decrease in platelet counts. Recommend hydroxyurea 500mg  on Sundays, and 1000 mg daily for the rest of the week.  History of pulmonary embolism continue Elqiuis 2.5mg  BID. -Patient also takes aspirin 81 mg for CAD  Monoclonal B-cell lymphocytosis of undetermined significance flowcytometry showed stable monoclonal lymphocytosis clone, <5000 Stable white count.  Continue observation   Orders Placed This Encounter  Procedures   CBC with Differential ([place] Only)    Standing Status:   Future    Standing Expiration Date:   04/26/2023   CBC with Differential ([place] Only)    Standing Status:   Future    Standing Expiration Date:   04/26/2023   CMP ([place] only)    Standing Status:   Future    Standing Expiration Date:   04/26/2023   Follow up 3 months repeat CBC in 4 weeks. All questions were answered. The patient knows to call the clinic with any problems, questions or concerns.  Edward Server, MD, PhD [facility] Hematology Oncology 04/26/2022      HISTORY OF PRESENTING ILLNESS:   Edward Mejia is a  77 y.o.  male with PMH including rheumatoid arthritis, peripheral vascular disease hypertension, Covid infection history,, was seen in consultation at the request of  No ref. provider found  for evaluation of polycytosis Patient was accompanied by her wife who participated in providing medical history. February 2021, patient was hospitalized due to COVID-19 pneumonia, respiratory failure, treated with steroids, remdesivir, received bamlanivimab.  Hospitalization was also completed by [facility], bleeding was  thought to be secondary to hemorrhoids.  He was discharged on 04/07/2019  and presented back to emergency room on 04/12/2019 due to left lower extremity swelling, acute hypoxic respiratory failure.  Work-up showed acute bilateral pulmonary embolism, large left lower extremity DVT, thrombosis and IVC and left external iliac vein.  Patient was started on anticoagulation and transition to Eliquis Patient has been on Eliquis since March 2021.  Patient also takes aspirin.  Patient has history of CAD, follows up with Dr. Ubaldo Glassing.  Rheumatoid arthritis on Enbrel once a week.  Also on prednisone 7.5 mg daily. Patient has also been found to have erythrocytosis. 09/20/2019, CBC showed hemoglobin of 17.6, reviewed previous medical records, erythrocytosis can be traced back to at least 2017.  Patient also has leukocytosis since March 2021.  Leukocytosis has trended down recently and CBC on 09/20/2019 showed a white count of 10.8, predominantly monocytosis.  Patient is a former smoker, 10-12-pack-year smoking history and he stopped smoking in 1984. Currently he has some chronic shortness of breath with exertion.  He has lost weight during his previous hospitalization and has gained weight since discharge.  Sometimes he feels sweaty.  Patient also reports intermittent dizziness/lightheaded episodes for the past couple of months.  He actually had an episode during today's visit and is spontaneously resolved after few minutes.  Denies any shortness of breath, chest pain, vision changes, focal deficits.  Patient has been referred to establish care with neurology for further evaluation.  #  psoriatic arthritis follows up with [facility] Dr. Abner Greenspan.on chronic prednisone.  # skin squamous cell carcinoma removal from his scalp. #09/27/2021 He recently underwent cardiac cath for abnormal stress test and unstable angina s/p drug eluting coronary stent  placement.   INTERVAL HISTORY [name] is a 77 y.o. male who has above history reviewed by me today presents for follow up visit for management of  polycythemia vera Patient has been on Eliquis 2.5 mg twice daily.  Takes hydroxyurea 1000mg  daily.  Appetite has improve weight is relatively stable. He denies night sweats, fever.    .Review of Systems  Constitutional:  Positive for fatigue. Negative for appetite change, chills, fever and unexpected weight change.  HENT:   Negative for hearing loss and voice change.   Eyes:  Negative for eye problems and icterus.  Respiratory:  Negative for chest tightness, cough and shortness of breath.   Cardiovascular:  Negative for chest pain and leg swelling.  Gastrointestinal:  Negative for abdominal distention, abdominal pain and blood in stool.  Endocrine: Negative for hot flashes.  Genitourinary:  Negative for difficulty urinating, dysuria and frequency.   Musculoskeletal:  Negative for arthralgias.  Skin:  Negative for itching and rash.  Neurological:  Negative for extremity weakness, light-headedness and numbness.  Hematological:  Negative for adenopathy. Does not bruise/bleed easily.  Psychiatric/Behavioral:  Negative for confusion.     MEDICAL HISTORY:  Past Medical History:  Diagnosis Date   Arthritis    Bilateral pulmonary embolism ([place]) 03/2019   in setting of Covid-19 pneumonia   Cancer ([place])    skin cancer   Cellulitis    Cellulitis    DVT (deep venous thrombosis) ([place]) 03/2019   in setting of Covid-19 pneumonia   Erythrocytosis 09/20/2019   H/O adenomatous polyp of colon 03/24/2015   HCAP (healthcare-associated pneumonia) 04/12/2019   History of brain disorder: history of amaurosis fugax 03/24/2015   History of pneumonia 04/04/2008   History of rheumatic fever 03/24/2015   DID Have Rheumatic Fever.     Hypertension    Left leg DVT ([place]) 04/12/2019   In setting of Covid-19 started anticoagulation 04-12-2019. Subsequently Dx PCV and long term NOAC per hematology    Peptic ulcer disease    Sepsis ([place]) 04/12/2019   secondary to Covid pneumonia    SURGICAL HISTORY: Past  Surgical History:  Procedure Laterality Date   APPENDECTOMY     LEFT HEART CATH AND CORONARY ANGIOGRAPHY Left 02/06/2018   Procedure: LEFT HEART CATH AND CORONARY ANGIOGRAPHY;  Surgeon: Teodoro Spray, MD;  Location: [facility];  Service: Cardiovascular;  Laterality: Left;   MANDIBLE FRACTURE SURGERY     fractured jaw   PARTIAL GASTRECTOMY     peptic ulcer disease    SOCIAL HISTORY: Social History   Socioeconomic History   Marital status: Married    Spouse name: Lovey Newcomer    Number of children: 4   Years of education: Not on file   Highest education level: 8th grade  Occupational History   Occupation: Disabled    Comment: due to Psoriatic Arthritis  Tobacco Use   Smoking status: Former    Types: Cigarettes    Quit date: 09/20/1982    Years since quitting: 39.6   Smokeless tobacco: Never   Tobacco comments:    quit over 40+ years ago  Vaping Use   Vaping Use: Never used  Substance and Sexual Activity   Alcohol use: No    Alcohol/week: 0.0 standard drinks of alcohol   Drug use: No   Sexual activity: Not on file  Other Topics Concern   Not on file  Social History Narrative   Not on file   Social Determinants of Health  Financial Resource Strain: Low Risk  (01/08/2019)   Overall Financial Resource Strain (CARDIA)    Difficulty of Paying Living Expenses: Not hard at all  Food Insecurity: No Food Insecurity (02/06/2018)   Hunger Vital Sign    Worried About Running Out of Food in the Last Year: Never true    Ran Out of Food in the Last Year: Never true  Transportation Needs: No Transportation Needs (02/06/2018)   PRAPARE - Hydrologist (Medical): No    Lack of Transportation (Non-Medical): No  Physical Activity: Unknown (01/08/2019)   Exercise Vital Sign    Days of Exercise per Week: Not on file    Minutes of Exercise per Session: 0 min  Stress: No Stress Concern Present (01/08/2019)   [place]    Feeling of Stress : Not at all  Social Connections: Unknown (01/08/2019)   Social Connection and Isolation Panel [NHANES]    Frequency of Communication with Friends and Family: Patient declined    Frequency of Social Gatherings with Friends and Family: Patient declined    Attends Religious Services: Patient declined    Marine scientist or Organizations: Patient declined    Attends Archivist Meetings: Patient declined    Marital Status: Patient declined  Intimate Partner Violence: Unknown (01/08/2019)   Humiliation, Afraid, Rape, and Kick questionnaire    Fear of Current or Ex-Partner: Patient declined    Emotionally Abused: Patient declined    Physically Abused: Patient declined    Sexually Abused: Patient declined    FAMILY HISTORY: Family History  Problem Relation Age of Onset   Cancer Mother        lung cancer   Heart disease Father 79       heart attack   Cancer Brother        pancreatic cancer   Cancer Brother        pancreatic cancer    ALLERGIES:  is allergic to pravachol [pravastatin].  MEDICATIONS:  Current Outpatient Medications  Medication Sig Dispense Refill   acetaminophen (TYLENOL) 500 MG tablet Take 1,000 mg by mouth 3 (three) times daily as needed for mild pain or headache.      albuterol (VENTOLIN HFA) 108 (90 Base) MCG/ACT inhaler Inhale 2 puffs into the lungs every 6 (six) hours as needed for wheezing or shortness of breath.     apixaban (ELIQUIS) 2.5 MG TABS tablet Take 1 tablet (2.5 mg total) by mouth 2 (two) times daily. 180 tablet 2   augmented betamethasone dipropionate (DIPROLENE-AF) 0.05 % cream Apply topically 2 (two) times daily as needed.     etanercept (ENBREL) 50 MG/ML injection Inject 50 mg into the skin every Thursday.      gabapentin (NEURONTIN) 300 MG capsule Take 900 mg by mouth See admin instructions. At 4 pm     HYDROcodone-acetaminophen (NORCO) 10-325 MG tablet Take 1 tablet by mouth  every 6 (six) hours as needed.     hydrocortisone 2.5 % cream APPLY TOPICALLY TO THE AFFECTED AREA TWICE DAILY AS NEEDED 30 g 3   hydroxyurea (HYDREA) 500 MG capsule TAKE 2 CAPSULES(1000 MG) BY MOUTH DAILY 180 capsule 1   isosorbide mononitrate (IMDUR) 120 MG 24 hr tablet Take 120 mg by mouth daily.      predniSONE (DELTASONE) 5 MG tablet Take 7.5 mg by mouth every morning.     ranolazine (RANEXA) 500 MG 12 hr tablet Take 500 mg  by mouth in the morning and at bedtime.     valsartan (DIOVAN) 160 MG tablet Take 1 tablet (160 mg total) by mouth daily. 90 tablet 4   clopidogrel (PLAVIX) 75 MG tablet Take 75 mg by mouth daily. (Patient not taking: Reported on 04/26/2022)     nitroGLYCERIN (NITROSTAT) 0.4 MG SL tablet Place 0.4 mg under the tongue every 5 (five) minutes as needed for chest pain. (Patient not taking: Reported on 08/04/2021)     triamcinolone acetonide (KENALOG-40) 40 MG/ML injection (RADIOLOGY ONLY) Inject into the articular space. (Patient not taking: Reported on 04/26/2022)     No current facility-administered medications for this visit.     PHYSICAL EXAMINATION: ECOG PERFORMANCE STATUS: 1 - Symptomatic but completely ambulatory Today's Vitals   04/26/22 1406  BP: 136/77  Pulse: 79  Resp: 18  Temp: (!) 97.1 F (36.2 C)  TempSrc: Tympanic  SpO2: 100%  Weight: 211 lb 3.2 oz (95.8 kg)  PainSc: 8   PainLoc: Hip   Body mass index is 28.64 kg/m.  Marland Kitchen Physical Exam Constitutional:      General: He is not in acute distress. HENT:     Head: Normocephalic and atraumatic.  Eyes:     General: No scleral icterus. Cardiovascular:     Rate and Rhythm: Normal rate and regular rhythm.     Heart sounds: Normal heart sounds.  Pulmonary:     Effort: Pulmonary effort is normal. No respiratory distress.     Breath sounds: No wheezing.  Abdominal:     General: Bowel sounds are normal. There is no distension.     Palpations: Abdomen is soft.  Musculoskeletal:        General: No  deformity. Normal range of motion.     Cervical back: Normal range of motion and neck supple.  Skin:    General: Skin is warm and dry.  Neurological:     Mental Status: He is alert and oriented to person, place, and time. Mental status is at baseline.     Cranial Nerves: No cranial nerve deficit.     Coordination: Coordination normal.  Psychiatric:        Mood and Affect: Mood normal.     LABORATORY DATA:  I have reviewed the data as listed    Latest Ref Rng & Units 04/25/2022   11:40 AM 01/25/2022    1:02 PM 11/01/2021   11:11 AM  CBC  WBC 4.0 - 10.5 K/uL 8.6  8.7  7.7   Hemoglobin 13.0 - 17.0 g/dL 15.1  15.1  14.5   Hematocrit 39.0 - 52.0 % 43.2  43.0  41.5   Platelets 150 - 400 K/uL 131  151  187       Latest Ref Rng & Units 04/25/2022   11:40 AM 01/25/2022    1:02 PM 11/01/2021   11:11 AM  CMP  Glucose 70 - 99 mg/dL 92  78  95   BUN 8 - 23 mg/dL 14  12  11    Creatinine 0.61 - 1.24 mg/dL 1.01  1.00  1.03   Sodium 135 - 145 mmol/L 130  129  128   Potassium 3.5 - 5.1 mmol/L 4.3  3.9  4.6   Chloride 98 - 111 mmol/L 95  94  95   CO2 22 - 32 mmol/L 28  28  30    Calcium 8.9 - 10.3 mg/dL 8.7  8.3  8.4   Total Protein 6.5 - 8.1 g/dL 6.4  6.2  6.0   Total Bilirubin 0.3 - 1.2 mg/dL 1.6  1.4  1.3   Alkaline Phos 38 - 126 U/L 28  28  26    AST 15 - 41 U/L 14  15  14    ALT 0 - 44 U/L 11  11  14       Iron/TIBC/Ferritin/ %Sat    Component Value Date/Time   IRON 49 09/20/2019 1201   TIBC 318 09/20/2019 1201   FERRITIN 24 09/20/2019 1201   IRONPCTSAT 15 (L) 09/20/2019 1201      RADIOGRAPHIC STUDIES: I have personally reviewed the radiological images as listed and agreed with the findings in the report. No results found.

## 2022-05-09 DIAGNOSIS — M48061 Spinal stenosis, lumbar region without neurogenic claudication: Secondary | ICD-10-CM | POA: Diagnosis not present

## 2022-05-09 DIAGNOSIS — M4726 Other spondylosis with radiculopathy, lumbar region: Secondary | ICD-10-CM | POA: Diagnosis not present

## 2022-05-09 DIAGNOSIS — M5136 Other intervertebral disc degeneration, lumbar region: Secondary | ICD-10-CM | POA: Diagnosis not present

## 2022-05-09 DIAGNOSIS — G894 Chronic pain syndrome: Secondary | ICD-10-CM | POA: Diagnosis not present

## 2022-05-18 DIAGNOSIS — Z79899 Other long term (current) drug therapy: Secondary | ICD-10-CM | POA: Diagnosis not present

## 2022-05-18 DIAGNOSIS — L4050 Arthropathic psoriasis, unspecified: Secondary | ICD-10-CM | POA: Diagnosis not present

## 2022-05-23 ENCOUNTER — Other Ambulatory Visit: Payer: Self-pay | Admitting: Family Medicine

## 2022-05-23 DIAGNOSIS — I10 Essential (primary) hypertension: Secondary | ICD-10-CM

## 2022-05-24 ENCOUNTER — Inpatient Hospital Stay: Payer: Medicare Other | Attending: Oncology

## 2022-05-24 DIAGNOSIS — D45 Polycythemia vera: Secondary | ICD-10-CM | POA: Diagnosis not present

## 2022-05-24 LAB — CBC WITH DIFFERENTIAL (CANCER CENTER ONLY)
Abs Immature Granulocytes: 0.2 10*3/uL — ABNORMAL HIGH (ref 0.00–0.07)
Basophils Absolute: 0.1 10*3/uL (ref 0.0–0.1)
Basophils Relative: 0 %
Eosinophils Absolute: 0 10*3/uL (ref 0.0–0.5)
Eosinophils Relative: 0 %
HCT: 45.1 % (ref 39.0–52.0)
Hemoglobin: 15.4 g/dL (ref 13.0–17.0)
Immature Granulocytes: 2 %
Lymphocytes Relative: 26 %
Lymphs Abs: 3.2 10*3/uL (ref 0.7–4.0)
MCH: 39.2 pg — ABNORMAL HIGH (ref 26.0–34.0)
MCHC: 34.1 g/dL (ref 30.0–36.0)
MCV: 114.8 fL — ABNORMAL HIGH (ref 80.0–100.0)
Monocytes Absolute: 0.9 10*3/uL (ref 0.1–1.0)
Monocytes Relative: 7 %
Neutro Abs: 8 10*3/uL — ABNORMAL HIGH (ref 1.7–7.7)
Neutrophils Relative %: 65 %
Platelet Count: 117 10*3/uL — ABNORMAL LOW (ref 150–400)
RBC: 3.93 MIL/uL — ABNORMAL LOW (ref 4.22–5.81)
RDW: 13.7 % (ref 11.5–15.5)
WBC Count: 12.3 10*3/uL — ABNORMAL HIGH (ref 4.0–10.5)
nRBC: 0 % (ref 0.0–0.2)

## 2022-06-21 DIAGNOSIS — Z85828 Personal history of other malignant neoplasm of skin: Secondary | ICD-10-CM | POA: Diagnosis not present

## 2022-06-21 DIAGNOSIS — G894 Chronic pain syndrome: Secondary | ICD-10-CM | POA: Diagnosis not present

## 2022-06-21 DIAGNOSIS — D0439 Carcinoma in situ of skin of other parts of face: Secondary | ICD-10-CM | POA: Diagnosis not present

## 2022-06-21 DIAGNOSIS — L570 Actinic keratosis: Secondary | ICD-10-CM | POA: Diagnosis not present

## 2022-06-21 DIAGNOSIS — M48062 Spinal stenosis, lumbar region with neurogenic claudication: Secondary | ICD-10-CM | POA: Diagnosis not present

## 2022-06-21 DIAGNOSIS — M47816 Spondylosis without myelopathy or radiculopathy, lumbar region: Secondary | ICD-10-CM | POA: Diagnosis not present

## 2022-06-21 DIAGNOSIS — M5136 Other intervertebral disc degeneration, lumbar region: Secondary | ICD-10-CM | POA: Diagnosis not present

## 2022-06-21 DIAGNOSIS — L308 Other specified dermatitis: Secondary | ICD-10-CM | POA: Diagnosis not present

## 2022-06-21 DIAGNOSIS — Z1330 Encounter for screening examination for mental health and behavioral disorders, unspecified: Secondary | ICD-10-CM | POA: Diagnosis not present

## 2022-07-08 DIAGNOSIS — M4807 Spinal stenosis, lumbosacral region: Secondary | ICD-10-CM | POA: Diagnosis not present

## 2022-07-08 DIAGNOSIS — R937 Abnormal findings on diagnostic imaging of other parts of musculoskeletal system: Secondary | ICD-10-CM | POA: Diagnosis not present

## 2022-07-08 DIAGNOSIS — M4856XD Collapsed vertebra, not elsewhere classified, lumbar region, subsequent encounter for fracture with routine healing: Secondary | ICD-10-CM | POA: Diagnosis not present

## 2022-07-08 DIAGNOSIS — M7138 Other bursal cyst, other site: Secondary | ICD-10-CM | POA: Diagnosis not present

## 2022-07-14 DIAGNOSIS — I10 Essential (primary) hypertension: Secondary | ICD-10-CM | POA: Diagnosis not present

## 2022-07-14 DIAGNOSIS — I25119 Atherosclerotic heart disease of native coronary artery with unspecified angina pectoris: Secondary | ICD-10-CM | POA: Diagnosis not present

## 2022-07-14 DIAGNOSIS — I2699 Other pulmonary embolism without acute cor pulmonale: Secondary | ICD-10-CM | POA: Diagnosis not present

## 2022-07-14 DIAGNOSIS — I824Z2 Acute embolism and thrombosis of unspecified deep veins of left distal lower extremity: Secondary | ICD-10-CM | POA: Diagnosis not present

## 2022-07-27 ENCOUNTER — Inpatient Hospital Stay (HOSPITAL_BASED_OUTPATIENT_CLINIC_OR_DEPARTMENT_OTHER): Payer: Medicare Other | Admitting: Oncology

## 2022-07-27 ENCOUNTER — Encounter: Payer: Self-pay | Admitting: Oncology

## 2022-07-27 ENCOUNTER — Inpatient Hospital Stay: Payer: Medicare Other | Attending: Oncology

## 2022-07-27 VITALS — BP 155/75 | HR 63 | Temp 98.0°F | Resp 18 | Wt 213.8 lb

## 2022-07-27 DIAGNOSIS — D45 Polycythemia vera: Secondary | ICD-10-CM | POA: Insufficient documentation

## 2022-07-27 DIAGNOSIS — Z8616 Personal history of COVID-19: Secondary | ICD-10-CM | POA: Diagnosis not present

## 2022-07-27 DIAGNOSIS — Z7901 Long term (current) use of anticoagulants: Secondary | ICD-10-CM | POA: Diagnosis not present

## 2022-07-27 DIAGNOSIS — I739 Peripheral vascular disease, unspecified: Secondary | ICD-10-CM | POA: Insufficient documentation

## 2022-07-27 DIAGNOSIS — Z86711 Personal history of pulmonary embolism: Secondary | ICD-10-CM | POA: Insufficient documentation

## 2022-07-27 DIAGNOSIS — M069 Rheumatoid arthritis, unspecified: Secondary | ICD-10-CM | POA: Diagnosis not present

## 2022-07-27 DIAGNOSIS — E871 Hypo-osmolality and hyponatremia: Secondary | ICD-10-CM

## 2022-07-27 DIAGNOSIS — D72820 Lymphocytosis (symptomatic): Secondary | ICD-10-CM

## 2022-07-27 DIAGNOSIS — Z7982 Long term (current) use of aspirin: Secondary | ICD-10-CM | POA: Diagnosis not present

## 2022-07-27 DIAGNOSIS — I10 Essential (primary) hypertension: Secondary | ICD-10-CM | POA: Diagnosis not present

## 2022-07-27 LAB — CMP (CANCER CENTER ONLY)
ALT: 12 U/L (ref 0–44)
AST: 15 U/L (ref 15–41)
Albumin: 3.7 g/dL (ref 3.5–5.0)
Alkaline Phosphatase: 29 U/L — ABNORMAL LOW (ref 38–126)
Anion gap: 8 (ref 5–15)
BUN: 13 mg/dL (ref 8–23)
CO2: 28 mmol/L (ref 22–32)
Calcium: 8.6 mg/dL — ABNORMAL LOW (ref 8.9–10.3)
Chloride: 95 mmol/L — ABNORMAL LOW (ref 98–111)
Creatinine: 1.01 mg/dL (ref 0.61–1.24)
GFR, Estimated: >60 mL/min (ref >60)
Glucose, Bld: 96 mg/dL (ref 70–99)
Potassium: 4.6 mmol/L (ref 3.5–5.1)
Sodium: 131 mmol/L — ABNORMAL LOW (ref 135–145)
Total Bilirubin: 1.2 mg/dL (ref 0.3–1.2)
Total Protein: 6.1 g/dL — ABNORMAL LOW (ref 6.5–8.1)

## 2022-07-27 LAB — CBC WITH DIFFERENTIAL (CANCER CENTER ONLY)
Abs Immature Granulocytes: 0.13 10*3/uL — ABNORMAL HIGH (ref 0.00–0.07)
Basophils Absolute: 0.1 10*3/uL (ref 0.0–0.1)
Basophils Relative: 1 %
Eosinophils Absolute: 0 10*3/uL (ref 0.0–0.5)
Eosinophils Relative: 0 %
HCT: 42.3 % (ref 39.0–52.0)
Hemoglobin: 14.6 g/dL (ref 13.0–17.0)
Immature Granulocytes: 1 %
Lymphocytes Relative: 37 %
Lymphs Abs: 3.4 10*3/uL (ref 0.7–4.0)
MCH: 39.2 pg — ABNORMAL HIGH (ref 26.0–34.0)
MCHC: 34.5 g/dL (ref 30.0–36.0)
MCV: 113.7 fL — ABNORMAL HIGH (ref 80.0–100.0)
Monocytes Absolute: 0.7 10*3/uL (ref 0.1–1.0)
Monocytes Relative: 7 %
Neutro Abs: 5 10*3/uL (ref 1.7–7.7)
Neutrophils Relative %: 54 %
Platelet Count: 177 10*3/uL (ref 150–400)
RBC: 3.72 MIL/uL — ABNORMAL LOW (ref 4.22–5.81)
RDW: 16 % — ABNORMAL HIGH (ref 11.5–15.5)
WBC Count: 9.3 10*3/uL (ref 4.0–10.5)
nRBC: 0 % (ref 0.0–0.2)

## 2022-07-27 MED ORDER — HYDROXYUREA 500 MG PO CAPS: 500.0000 mg | ORAL_CAPSULE | ORAL | 1 refills | Status: DC

## 2022-07-27 MED ORDER — APIXABAN 2.5 MG PO TABS: 2.5000 mg | ORAL_TABLET | Freq: Two times a day (BID) | ORAL | 3 refills | Status: DC

## 2022-07-27 NOTE — Progress Notes (Signed)
Hematology/Oncology Progress note [phone] [phone]     CHIEF COMPLAINTS/REASON FOR VISIT:  Follow-up for polycythemia vera-Jak [id number] mutation  ASSESSMENT & PLAN:   Polycythemia vera (HCC) JAK2 V617F mutation. Labs reviewed and discussed with patient. Continue hydroxyurea 500mg  on Sundays, and 1000 mg daily for the rest of the week.  History of pulmonary embolism continue Elqiuis 2.5mg  BID. -Patient also takes aspirin 81 mg for CAD  Monoclonal B-cell lymphocytosis of undetermined significance flowcytometry showed stable monoclonal lymphocytosis clone, <5000 Stable white count.  Continue observation   Hyponatremia Stable. Encourage patient to increase oral hydration.    Orders Placed This Encounter  Procedures   CBC with Differential (Cancer Center Only)    Standing Status:   Future    Standing Expiration Date:   07/27/2023   CMP (Cancer Center only)    Standing Status:   Future    Standing Expiration Date:   07/27/2023   Follow up 3 months All questions were answered. The patient knows to call the clinic with any problems, questions or concerns.  [name], MD, PhD [facility] Hematology Oncology 07/27/2022      HISTORY OF PRESENTING ILLNESS:   Edward Mejia is a  77 y.o.  male with PMH including rheumatoid arthritis, peripheral vascular disease hypertension, Covid infection history,, was seen in consultation at the request of  Fisher, Demetrios Isaacs, MD  for evaluation of polycytosis Patient was accompanied by her wife who participated in providing medical history. February 2021, patient was hospitalized due to COVID-19 pneumonia, respiratory failure, treated with steroids, remdesivir, received bamlanivimab.  Hospitalization was also completed by [facility], bleeding was  thought to be secondary to hemorrhoids.  He was discharged on 04/07/2019 and presented back to emergency room on 04/12/2019 due to left lower extremity swelling, acute hypoxic  respiratory failure.  Work-up showed acute bilateral pulmonary embolism, large left lower extremity DVT, thrombosis and IVC and left external iliac vein.  Patient was started on anticoagulation and transition to Eliquis Patient has been on Eliquis since March 2021.  Patient also takes aspirin.  Patient has history of CAD, follows up with Dr. Lady Gary.  Rheumatoid arthritis on Enbrel once a week.  Also on prednisone 7.5 mg daily. Patient has also been found to have erythrocytosis. 09/20/2019, CBC showed hemoglobin of 17.6, reviewed previous medical records, erythrocytosis can be traced back to at least 2017.  Patient also has leukocytosis since March 2021.  Leukocytosis has trended down recently and CBC on 09/20/2019 showed a white count of 10.8, predominantly monocytosis.  Patient is a former smoker, 10-12-pack-year smoking history and he stopped smoking in 1984. Currently he has some chronic shortness of breath with exertion.  He has lost weight during his previous hospitalization and has gained weight since discharge.  Sometimes he feels sweaty.  Patient also reports intermittent dizziness/lightheaded episodes for the past couple of months.  He actually had an episode during today's visit and is spontaneously resolved after few minutes.  Denies any shortness of breath, chest pain, vision changes, focal deficits.  Patient has been referred to establish care with neurology for further evaluation.  #  psoriatic arthritis follows up with [facility] Dr. Cardell Peach.on chronic prednisone.  # skin squamous cell carcinoma removal from his scalp. #09/27/2021 He recently underwent cardiac cath for abnormal stress test and unstable angina s/p drug eluting coronary stent placement.   INTERVAL HISTORY Edward Mejia is a 77 y.o. male who has above history reviewed by me today presents for follow  up visit for management of polycythemia vera Patient has been on Eliquis 2.5 mg twice daily.  Takes hydroxyurea 1000mg  daily  except 500 mg on Sundays. Appetite has improve weight is relatively stable. He denies night sweats, fever.    .Review of Systems  Constitutional:  Positive for fatigue. Negative for appetite change, chills, fever and unexpected weight change.  HENT:   Negative for hearing loss and voice change.   Eyes:  Negative for eye problems and icterus.  Respiratory:  Negative for chest tightness, cough and shortness of breath.   Cardiovascular:  Negative for chest pain and leg swelling.  Gastrointestinal:  Negative for abdominal distention, abdominal pain and blood in stool.  Endocrine: Negative for hot flashes.  Genitourinary:  Negative for difficulty urinating, dysuria and frequency.   Musculoskeletal:  Negative for arthralgias.  Skin:  Negative for itching and rash.  Neurological:  Negative for extremity weakness, light-headedness and numbness.  Hematological:  Negative for adenopathy. Does not bruise/bleed easily.  Psychiatric/Behavioral:  Negative for confusion.     MEDICAL HISTORY:  Past Medical History:  Diagnosis Date   Arthritis    Bilateral pulmonary embolism (HCC) 03/2019   in setting of Covid-19 pneumonia   Cancer (HCC)    skin cancer   Cellulitis    Cellulitis    DVT (deep venous thrombosis) (HCC) 03/2019   in setting of Covid-19 pneumonia   Erythrocytosis 09/20/2019   H/O adenomatous polyp of colon 03/24/2015   HCAP (healthcare-associated pneumonia) 04/12/2019   History of brain disorder: history of amaurosis fugax 03/24/2015   History of pneumonia 04/04/2008   History of rheumatic fever 03/24/2015   DID Have Rheumatic Fever.     Hypertension    Left leg DVT (HCC) 04/12/2019   In setting of Covid-19 started anticoagulation 04-12-2019. Subsequently Dx PCV and long term NOAC per hematology    Peptic ulcer disease    Sepsis (HCC) 04/12/2019   secondary to Covid pneumonia    SURGICAL HISTORY: Past Surgical History:  Procedure Laterality Date   APPENDECTOMY     LEFT HEART CATH  AND CORONARY ANGIOGRAPHY Left 02/06/2018   Procedure: LEFT HEART CATH AND CORONARY ANGIOGRAPHY;  Surgeon: Dalia Heading, MD;  Location: [facility] INVASIVE CV LAB;  Service: Cardiovascular;  Laterality: Left;   MANDIBLE FRACTURE SURGERY     fractured jaw   PARTIAL GASTRECTOMY     peptic ulcer disease    SOCIAL HISTORY: Social History   Socioeconomic History   Marital status: Married    Spouse name: Andrey Campanile    Number of children: 4   Years of education: Not on file   Highest education level: 8th grade  Occupational History   Occupation: Disabled    Comment: due to Psoriatic Arthritis  Tobacco Use   Smoking status: Former    Types: Cigarettes    Quit date: 09/20/1982    Years since quitting: 39.8   Smokeless tobacco: Never   Tobacco comments:    quit over 40+ years ago  Vaping Use   Vaping Use: Never used  Substance and Sexual Activity   Alcohol use: No    Alcohol/week: 0.0 standard drinks of alcohol   Drug use: No   Sexual activity: Not on file  Other Topics Concern   Not on file  Social History Narrative   Not on file   Social Determinants of Health   Financial Resource Strain: Low Risk  (01/08/2019)   Overall Financial Resource Strain (CARDIA)    Difficulty of Paying  Living Expenses: Not hard at all  Food Insecurity: No Food Insecurity (02/06/2018)   Hunger Vital Sign    Worried About Running Out of Food in the Last Year: Never true    Ran Out of Food in the Last Year: Never true  Transportation Needs: No Transportation Needs (02/06/2018)   PRAPARE - Administrator, Civil Service (Medical): No    Lack of Transportation (Non-Medical): No  Physical Activity: Unknown (01/08/2019)   Exercise Vital Sign    Days of Exercise per Week: Not on file    Minutes of Exercise per Session: 0 min  Stress: No Stress Concern Present (01/08/2019)   [employer] of Occupational Health - Occupational Stress Questionnaire    Feeling of Stress : Not at all  Social  Connections: Unknown (01/08/2019)   Social Connection and Isolation Panel [NHANES]    Frequency of Communication with Friends and Family: Patient declined    Frequency of Social Gatherings with Friends and Family: Patient declined    Attends Religious Services: Patient declined    Database administrator or Organizations: Patient declined    Attends Banker Meetings: Patient declined    Marital Status: Patient declined  Intimate Partner Violence: Unknown (01/08/2019)   Humiliation, Afraid, Rape, and Kick questionnaire    Fear of Current or Ex-Partner: Patient declined    Emotionally Abused: Patient declined    Physically Abused: Patient declined    Sexually Abused: Patient declined    FAMILY HISTORY: Family History  Problem Relation Age of Onset   Cancer Mother        lung cancer   Heart disease Father 86       heart attack   Cancer Brother        pancreatic cancer   Cancer Brother        pancreatic cancer    ALLERGIES:  is allergic to pravachol [pravastatin].  MEDICATIONS:  Current Outpatient Medications  Medication Sig Dispense Refill   acetaminophen (TYLENOL) 500 MG tablet Take 1,000 mg by mouth 3 (three) times daily as needed for mild pain or headache.      albuterol (VENTOLIN HFA) 108 (90 Base) MCG/ACT inhaler Inhale 2 puffs into the lungs every 6 (six) hours as needed for wheezing or shortness of breath.     augmented betamethasone dipropionate (DIPROLENE-AF) 0.05 % cream Apply topically 2 (two) times daily as needed.     etanercept (ENBREL) 50 MG/ML injection Inject 50 mg into the skin every Thursday.      gabapentin (NEURONTIN) 300 MG capsule Take 900 mg by mouth See admin instructions. At 4 pm     HYDROcodone-acetaminophen (NORCO) 10-325 MG tablet Take 1 tablet by mouth every 6 (six) hours as needed.     hydrocortisone 2.5 % cream APPLY TOPICALLY TO THE AFFECTED AREA TWICE DAILY AS NEEDED 30 g 3   isosorbide mononitrate (IMDUR) 120 MG 24 hr tablet Take 120  mg by mouth daily.      predniSONE (DELTASONE) 5 MG tablet Take 7.5 mg by mouth every morning.     ranolazine (RANEXA) 500 MG 12 hr tablet Take 500 mg by mouth in the morning and at bedtime.     valsartan (DIOVAN) 160 MG tablet TAKE 1 TABLET(160 MG) BY MOUTH DAILY 90 tablet 4   apixaban (ELIQUIS) 2.5 MG TABS tablet Take 1 tablet (2.5 mg total) by mouth 2 (two) times daily. 180 tablet 3   hydroxyurea (HYDREA) 500 MG capsule Take 1 capsule (  500 mg total) by mouth See admin instructions. May take with food to minimize GI side effects. Take 500mg  on Sundays, and 1000 mg daily for the rest of the week. 180 capsule 1   nitroGLYCERIN (NITROSTAT) 0.4 MG SL tablet Place 0.4 mg under the tongue every 5 (five) minutes as needed for chest pain. (Patient not taking: Reported on 08/04/2021)     triamcinolone acetonide (KENALOG-40) 40 MG/ML injection (RADIOLOGY ONLY) Inject into the articular space. (Patient not taking: Reported on 04/26/2022)     No current facility-administered medications for this visit.     PHYSICAL EXAMINATION: ECOG PERFORMANCE STATUS: 1 - Symptomatic but completely ambulatory Today's Vitals   07/27/22 1317  BP: (!) 155/75  Pulse: 63  Resp: 18  Temp: 98 F (36.7 C)  Weight: 213 lb 12.8 oz (97 kg)  PainSc: 0-No pain   Body mass index is 29 kg/m.  Marland Kitchen Physical Exam Constitutional:      General: He is not in acute distress. HENT:     Head: Normocephalic and atraumatic.  Eyes:     General: No scleral icterus. Cardiovascular:     Rate and Rhythm: Normal rate and regular rhythm.     Heart sounds: Normal heart sounds.  Pulmonary:     Effort: Pulmonary effort is normal. No respiratory distress.     Breath sounds: No wheezing.  Abdominal:     General: Bowel sounds are normal. There is no distension.     Palpations: Abdomen is soft.  Musculoskeletal:        General: No deformity. Normal range of motion.     Cervical back: Normal range of motion and neck supple.  Skin:     General: Skin is warm and dry.  Neurological:     Mental Status: He is alert and oriented to person, place, and time. Mental status is at baseline.     Cranial Nerves: No cranial nerve deficit.     Coordination: Coordination normal.  Psychiatric:        Mood and Affect: Mood normal.     LABORATORY DATA:  I have reviewed the data as listed    Latest Ref Rng & Units 07/27/2022    1:01 PM 05/24/2022    9:44 AM 04/25/2022   11:40 AM  CBC  WBC 4.0 - 10.5 K/uL 9.3  12.3  8.6   Hemoglobin 13.0 - 17.0 g/dL 16.1  09.6  04.5   Hematocrit 39.0 - 52.0 % 42.3  45.1  43.2   Platelets 150 - 400 K/uL 177  117  131       Latest Ref Rng & Units 07/27/2022    1:01 PM 04/25/2022   11:40 AM 01/25/2022    1:02 PM  CMP  Glucose 70 - 99 mg/dL 96  92  78   BUN 8 - 23 mg/dL 13  14  12    Creatinine 0.61 - 1.24 mg/dL 4.09  8.11  9.14   Sodium 135 - 145 mmol/L 131  130  129   Potassium 3.5 - 5.1 mmol/L 4.6  4.3  3.9   Chloride 98 - 111 mmol/L 95  95  94   CO2 22 - 32 mmol/L 28  28  28    Calcium 8.9 - 10.3 mg/dL 8.6  8.7  8.3   Total Protein 6.5 - 8.1 g/dL 6.1  6.4  6.2   Total Bilirubin 0.3 - 1.2 mg/dL 1.2  1.6  1.4   Alkaline Phos 38 - 126 U/L 29  28  28   AST 15 - 41 U/L 15  14  15    ALT 0 - 44 U/L 12  11  11       Iron/TIBC/Ferritin/ %Sat    Component Value Date/Time   IRON 49 09/20/2019 1201   TIBC 318 09/20/2019 1201   FERRITIN 24 09/20/2019 1201   IRONPCTSAT 15 (L) 09/20/2019 1201      RADIOGRAPHIC STUDIES: I have personally reviewed the radiological images as listed and agreed with the findings in the report. No results found.

## 2022-07-27 NOTE — Assessment & Plan Note (Signed)
continue Elqiuis 2.5mg BID. -Patient also takes aspirin 81 mg for CAD 

## 2022-07-27 NOTE — Assessment & Plan Note (Signed)
Stable. Encourage patient to increase oral hydration.

## 2022-07-27 NOTE — Assessment & Plan Note (Signed)
JAK2 V617F mutation. Labs reviewed and discussed with patient. Continue hydroxyurea 500mg  on Sundays, and 1000 mg daily for the rest of the week.

## 2022-07-27 NOTE — Assessment & Plan Note (Signed)
flowcytometry showed stable monoclonal lymphocytosis clone, <5000 Stable white count.  Continue observation  

## 2022-09-01 ENCOUNTER — Other Ambulatory Visit: Payer: Self-pay

## 2022-09-01 MED ORDER — APIXABAN 2.5 MG PO TABS: 2.5000 mg | ORAL_TABLET | Freq: Two times a day (BID) | ORAL | 3 refills | Status: DC

## 2022-09-01 NOTE — Progress Notes (Signed)
Sent in application for free Eliquis to [employer]

## 2022-09-06 NOTE — Progress Notes (Signed)
Durl was approved for free Eliquis through [employer] from 09/05/2022 to 02/07/2023. ID# [id number]

## 2022-09-20 DIAGNOSIS — G894 Chronic pain syndrome: Secondary | ICD-10-CM | POA: Diagnosis not present

## 2022-09-20 DIAGNOSIS — M5136 Other intervertebral disc degeneration, lumbar region: Secondary | ICD-10-CM | POA: Diagnosis not present

## 2022-09-20 DIAGNOSIS — M47816 Spondylosis without myelopathy or radiculopathy, lumbar region: Secondary | ICD-10-CM | POA: Diagnosis not present

## 2022-09-20 DIAGNOSIS — M48062 Spinal stenosis, lumbar region with neurogenic claudication: Secondary | ICD-10-CM | POA: Diagnosis not present

## 2022-09-21 DIAGNOSIS — Z85828 Personal history of other malignant neoplasm of skin: Secondary | ICD-10-CM | POA: Diagnosis not present

## 2022-09-21 DIAGNOSIS — L821 Other seborrheic keratosis: Secondary | ICD-10-CM | POA: Diagnosis not present

## 2022-09-21 DIAGNOSIS — L570 Actinic keratosis: Secondary | ICD-10-CM | POA: Diagnosis not present

## 2022-09-21 DIAGNOSIS — L565 Disseminated superficial actinic porokeratosis (DSAP): Secondary | ICD-10-CM | POA: Diagnosis not present

## 2022-09-21 DIAGNOSIS — L308 Other specified dermatitis: Secondary | ICD-10-CM | POA: Diagnosis not present

## 2022-10-11 ENCOUNTER — Encounter: Payer: Self-pay | Admitting: Oncology

## 2022-10-12 ENCOUNTER — Ambulatory Visit
Admission: RE | Admit: 2022-10-12 | Discharge: 2022-10-12 | Disposition: A | Discharge status: Home / Self Care | Payer: Medicare Other | Attending: Physician Assistant | Admitting: Physician Assistant

## 2022-10-12 ENCOUNTER — Ambulatory Visit
Admission: RE | Admit: 2022-10-12 | Discharge: 2022-10-12 | Disposition: A | Discharge status: Home / Self Care | Payer: Medicare Other | Source: Ambulatory Visit | Attending: Physician Assistant | Admitting: Physician Assistant

## 2022-10-12 ENCOUNTER — Encounter: Payer: Self-pay | Admitting: Physician Assistant

## 2022-10-12 ENCOUNTER — Ambulatory Visit: Payer: Self-pay | Admitting: *Deleted

## 2022-10-12 ENCOUNTER — Telehealth: Payer: Self-pay | Admitting: Family Medicine

## 2022-10-12 ENCOUNTER — Ambulatory Visit (INDEPENDENT_AMBULATORY_CARE_PROVIDER_SITE_OTHER): Payer: Medicare Other | Admitting: Physician Assistant

## 2022-10-12 VITALS — BP 136/66 | HR 67 | Temp 97.5°F | Resp 16 | Ht 72.0 in | Wt 211.0 lb

## 2022-10-12 DIAGNOSIS — R0989 Other specified symptoms and signs involving the circulatory and respiratory systems: Secondary | ICD-10-CM | POA: Diagnosis not present

## 2022-10-12 DIAGNOSIS — R0789 Other chest pain: Secondary | ICD-10-CM | POA: Diagnosis not present

## 2022-10-12 DIAGNOSIS — M7989 Other specified soft tissue disorders: Secondary | ICD-10-CM

## 2022-10-12 DIAGNOSIS — K1379 Other lesions of oral mucosa: Secondary | ICD-10-CM

## 2022-10-12 DIAGNOSIS — I700 Atherosclerosis of aorta: Secondary | ICD-10-CM | POA: Diagnosis not present

## 2022-10-12 DIAGNOSIS — D45 Polycythemia vera: Secondary | ICD-10-CM

## 2022-10-12 DIAGNOSIS — R9389 Abnormal findings on diagnostic imaging of other specified body structures: Secondary | ICD-10-CM | POA: Diagnosis not present

## 2022-10-12 DIAGNOSIS — R079 Chest pain, unspecified: Secondary | ICD-10-CM | POA: Diagnosis not present

## 2022-10-12 MED ORDER — ALUM & MAG HYDROXIDE-SIMETH 200-200-20 MG/5ML PO SUSP
5.0000 mL | Freq: Four times a day (QID) | ORAL | 0 refills | Status: DC
Start: 2022-10-12 — End: 2022-10-13

## 2022-10-12 NOTE — Telephone Encounter (Signed)
Edward Mejia patients spouse called stated there was an escript sent in for compound drug for today, and she stated pharmacy had a computer glitch and either needs the script resent or she just needs to know the quantity of each compound / ingredient. Please advise.    [facility] # [phone]

## 2022-10-12 NOTE — Telephone Encounter (Signed)
  Chief Complaint: Medication Symptoms: NA Frequency: NA Pertinent Negatives: Patient denies NA Disposition: [] ED /[] Urgent Care (no appt availability in office) / [] Appointment(In office/virtual)/ []  [place] [facility]/ [] Home Care/ [] Refused Recommended Disposition /[]  Mobile Bus/ [x]  Follow-up with PCP Additional Notes:   Spoke with pharmacist Darl Pikes. States needs amount of each compound in magic mouthwash, prescribed today.  Please advise. Thank you. Reason for Disposition  [1] Caller has URGENT medicine question about med that PCP or specialist prescribed AND [2] triager unable to answer question  Answer Assessment - Initial Assessment Questions 1. NAME of MEDICINE: "What medicine(s) are you calling about?"     MAgic Mouth Wash 2. QUESTION: "What is your question?" (e.g., double dose of medicine, side effect)     How much of each med 3. PRESCRIBER: "Who prescribed the medicine?" Reason: if prescribed by specialist, call should be referred to that group.     PCP  Protocols used: Medication Question Call-A-AH

## 2022-10-12 NOTE — Progress Notes (Signed)
Established patient visit  Patient: Edward Mejia   DOB: February 25, 1945   77 y.o. Male  MRN: [id number] Visit Date: 10/12/2022  Today's healthcare provider: Debera Lat, PA-C   CC: mouth ulcers and lightheadedness  Subjective     HPI   Patient c/o ulcers on month and throat x 3-4 days. Patient reports fatigue in the last 3-4 days. He reports feeling much better today. Patient c/o recurrent chest pressure. Patient reports cardio is aware.  Last edited by Myles Lipps, CMA on 10/12/2022  8:29 AM.      Discussed the use of AI scribe software for clinical note transcription with the patient, who gave verbal consent to proceed.  History of Present Illness   The patient, with a history of polycythemia vera, hypertension, peripheral vascular disease, and coronary artery disease, presents with mouth ulcers and lightheadedness. The mouth ulcers, located on the outside of the gums under the dentures, started about a week ago and have since spread. The patient denies any changes in oral hygiene practices or denture care.  The patient also reports lightheadedness, particularly when changing positions or turning his head. This symptom has been present for about 16 months and has been causing balance issues, leading to the patient needing to hold onto things while walking. The patient denies any associated nausea.  The patient also mentions a persistent dry cough that has been present for a couple of months. He attributes this to a dry throat and usually manages it by drinking water.  The patient's spouse mentions that the patient has been experiencing a little more chest pressure than usual over the last few days. The patient has a history of stent placement in the right coronary artery about a year ago.           10/12/2022    8:23 AM 10/05/2020    2:18 PM 07/20/2020    2:57 PM  Depression screen PHQ 2/9  Decreased Interest 0 0 0  Down, Depressed, Hopeless 0 0 0  PHQ - 2 Score 0 0  0  Altered sleeping 0 0 0  Tired, decreased energy 0 0 1  Change in appetite 0 0 0  Feeling bad or failure about yourself  0 0 0  Trouble concentrating 0 0 3  Moving slowly or fidgety/restless 0 0 0  Suicidal thoughts 0 0 0  PHQ-9 Score 0 0 4  Difficult doing work/chores Not difficult at all Not difficult at all Not difficult at all      10/12/2022    8:23 AM  GAD 7 : Generalized Anxiety Score  Nervous, Anxious, on Edge 0  Control/stop worrying 0  Worry too much - different things 0  Trouble relaxing 0  Restless 0  Easily annoyed or irritable 0  Afraid - awful might happen 0  Total GAD 7 Score 0  Anxiety Difficulty Not difficult at all    Medications: Outpatient Medications Prior to Visit  Medication Sig   acetaminophen (TYLENOL) 500 MG tablet Take 1,000 mg by mouth 3 (three) times daily as needed for mild pain or headache.    albuterol (VENTOLIN HFA) 108 (90 Base) MCG/ACT inhaler Inhale 2 puffs into the lungs every 6 (six) hours as needed for wheezing or shortness of breath.   apixaban (ELIQUIS) 2.5 MG TABS tablet Take 1 tablet (2.5 mg total) by mouth 2 (two) times daily.   augmented betamethasone dipropionate (DIPROLENE-AF) 0.05 % cream Apply topically 2 (two) times daily as needed.   etanercept (  ENBREL) 50 MG/ML injection Inject 50 mg into the skin every Thursday.    gabapentin (NEURONTIN) 300 MG capsule Take 900 mg by mouth See admin instructions. At 4 pm   HYDROcodone-acetaminophen (NORCO) 10-325 MG tablet Take 1 tablet by mouth every 6 (six) hours as needed.   hydrocortisone 2.5 % cream APPLY TOPICALLY TO THE AFFECTED AREA TWICE DAILY AS NEEDED   hydroxyurea (HYDREA) 500 MG capsule Take 1 capsule (500 mg total) by mouth See admin instructions. May take with food to minimize GI side effects. Take 500mg  on Sundays, and 1000 mg daily for the rest of the week.   isosorbide mononitrate (IMDUR) 120 MG 24 hr tablet Take 120 mg by mouth daily.    nitroGLYCERIN (NITROSTAT) 0.4 MG SL  tablet Place 0.4 mg under the tongue every 5 (five) minutes as needed for chest pain.   predniSONE (DELTASONE) 5 MG tablet Take 7.5 mg by mouth every morning.   ranolazine (RANEXA) 1000 MG SR tablet Take 1,000 mg by mouth 2 (two) times daily.   valsartan (DIOVAN) 160 MG tablet TAKE 1 TABLET(160 MG) BY MOUTH DAILY   [DISCONTINUED] ranolazine (RANEXA) 500 MG 12 hr tablet Take 500 mg by mouth in the morning and at bedtime.   [DISCONTINUED] triamcinolone acetonide (KENALOG-40) 40 MG/ML injection (RADIOLOGY ONLY) Inject into the articular space. (Patient not taking: Reported on 04/26/2022)   No facility-administered medications prior to visit.    Review of Systems  All other systems reviewed and are negative.  Except see HPI       Objective    BP 136/66 (BP Location: Right Arm, Patient Position: Sitting, Cuff Size: Large)   Pulse 67   Temp (!) 97.5 F (36.4 C) (Temporal)   Resp 16   Ht 6' (1.829 m)   Wt 211 lb (95.7 kg)   SpO2 98%   BMI 28.62 kg/m     Physical Exam Vitals reviewed.  Constitutional:      General: He is not in acute distress.    Appearance: Normal appearance. He is not diaphoretic.  HENT:     Head: Normocephalic and atraumatic.  Eyes:     General: No scleral icterus.    Conjunctiva/sclera: Conjunctivae normal.  Cardiovascular:     Rate and Rhythm: Normal rate and regular rhythm.     Pulses: Normal pulses.     Heart sounds: Normal heart sounds. No murmur heard. Pulmonary:     Effort: Pulmonary effort is normal. No respiratory distress.     Breath sounds: Rales (bibasilar) present.  Musculoskeletal:     Cervical back: Neck supple.     Right lower leg: No edema.     Left lower leg: No edema.  Lymphadenopathy:     Cervical: No cervical adenopathy.  Skin:    General: Skin is warm and dry.     Findings: No rash.  Neurological:     Mental Status: He is alert and oriented to person, place, and time. Mental status is at baseline.  Psychiatric:        Mood  and Affect: Mood normal.        Behavior: Behavior normal.      No results found for any visits on 10/12/22.  Assessment & Plan        Oral Ulceration New onset oral ulceration under dentures, with associated coating of the tongue and sore throat. Possible side effect of immunosuppressive status due to PV -Prescribe magic mouthwash for symptomatic relief. -Advise patient to inform oncologist  about the oral symptoms. -If symptoms persist, follow up with Dr. Sherrie Mustache. - magic mouthwash (nystatin, lidocaine, diphenhydrAMINE, alum & mag hydroxide) suspension; Swish and swallow 5 mLs 4 (four) times daily.  Dispense: 180 mL; Refill: 0  Lightheadedness and Balance Issues Chronic lightheadedness and balance issues, worsening over the past 16 months. Symptoms exacerbated by positional changes and head movements. No associated nausea. -Advise patient to rise slowly from sitting position and to ensure adequate hydration. -Order EKG and cardiac markers to rule out cardiac causes. -Order labs to monitor for potential side effects of hydroxyurea. -Consider further evaluation if symptoms persist.  Chronic Cough Normal vitals Dry cough present for the past 2 months, associated with a dry throat. Bibasilar crackles on PE -Order chest X-ray to rule out pulmonary causes.  Polycythemia Vera Managed by oncologist Dr. Cathie Hoops with hydroxyurea. -Continue current management and follow up with Dr. Cathie Hoops in two weeks.  Chest tightness/Leg swelling/Abnormal lung sounds/ Hx of Coronary Artery Disease History of two stents placed in the right coronary artery one year ago. -Continue current management and follow up with cardiologist in six weeks.  Initial workup - D-Dimer, Quantitative - Pro b natriuretic peptide (BNP)[facility]) - Comprehensive metabolic panel - CBC with Differential/Platelet - Troponin T - DG Chest 2 View; Future - EKG 12-Lead normal sinus rhythm with left axis  deviation and nonspecific t wave abnormalities, similar to the ones from 01/2022 Will reassess after lab results   Return in about 1 week (around 10/19/2022) for chronic disease f/u with Dr. Sherrie Mustache.     The patient was advised to call back or seek an in-person evaluation if the symptoms worsen or if the condition fails to improve as anticipated.  I discussed the assessment and treatment plan with the patient. The patient was provided an opportunity to ask questions and all were answered. The patient agreed with the plan and demonstrated an understanding of the instructions.  I, Debera Lat, PA-C have reviewed all documentation for this visit. The documentation on  10/12/22  for the exam, diagnosis, procedures, and orders are all accurate and complete.  Debera Lat, [facility], MMS [facility] [phone] (phone) [phone] (fax)  [facility]

## 2022-10-12 NOTE — Telephone Encounter (Signed)
Summary: pharnacy needs medication clarification   Darl Pikes, pharmacist with [facility], has called and stated there was an  escript sent in for compound drug for today, and she stated she had a computer glitch and either needs the script resent or she just needs to know the quantity of each compound / ingredient. Please advise.   [facility] # [phone]      Extended wait, disconnected x 2. Another extended wait. Will attempt at later time.

## 2022-10-13 ENCOUNTER — Other Ambulatory Visit: Payer: Self-pay

## 2022-10-13 DIAGNOSIS — K1379 Other lesions of oral mucosa: Secondary | ICD-10-CM

## 2022-10-13 LAB — COMPREHENSIVE METABOLIC PANEL
ALT: 13 IU/L (ref 0–44)
AST: 15 IU/L (ref 0–40)
Albumin: 4.2 g/dL (ref 3.8–4.8)
Alkaline Phosphatase: 41 IU/L — ABNORMAL LOW (ref 44–121)
BUN/Creatinine Ratio: 11 (ref 10–24)
BUN: 13 mg/dL (ref 8–27)
Bilirubin Total: 1.1 mg/dL (ref 0.0–1.2)
CO2: 25 mmol/L (ref 20–29)
Calcium: 9.6 mg/dL (ref 8.6–10.2)
Chloride: 97 mmol/L (ref 96–106)
Creatinine, Ser: 1.23 mg/dL (ref 0.76–1.27)
Globulin, Total: 1.8 g/dL (ref 1.5–4.5)
Glucose: 86 mg/dL (ref 70–99)
Potassium: 5 mmol/L (ref 3.5–5.2)
Sodium: 135 mmol/L (ref 134–144)
Total Protein: 6 g/dL (ref 6.0–8.5)
eGFR: 60 mL/min/{1.73_m2} (ref >59)

## 2022-10-13 LAB — CBC WITH DIFFERENTIAL/PLATELET
Basophils Absolute: 0.1 10*3/uL (ref 0.0–0.2)
Basos: 1 %
EOS (ABSOLUTE): 0.1 10*3/uL (ref 0.0–0.4)
Eos: 1 %
Hematocrit: 45.5 % (ref 37.5–51.0)
Hemoglobin: 15.9 g/dL (ref 13.0–17.7)
Immature Grans (Abs): 0.1 10*3/uL (ref 0.0–0.1)
Immature Granulocytes: 1 %
Lymphocytes Absolute: 2.8 10*3/uL (ref 0.7–3.1)
Lymphs: 33 %
MCH: 37.6 pg — ABNORMAL HIGH (ref 26.6–33.0)
MCHC: 34.9 g/dL (ref 31.5–35.7)
MCV: 108 fL — ABNORMAL HIGH (ref 79–97)
Monocytes Absolute: 0.6 10*3/uL (ref 0.1–0.9)
Monocytes: 8 %
Neutrophils Absolute: 4.9 10*3/uL (ref 1.4–7.0)
Neutrophils: 56 %
Platelets: 147 10*3/uL — ABNORMAL LOW (ref 150–450)
RBC: 4.23 x10E6/uL (ref 4.14–5.80)
RDW: 13.1 % (ref 11.6–15.4)
WBC: 8.5 10*3/uL (ref 3.4–10.8)

## 2022-10-13 LAB — PRO B NATRIURETIC PEPTIDE: NT-Pro BNP: 278 pg/mL (ref 0–486)

## 2022-10-13 LAB — TROPONIN T: Troponin T (Highly Sensitive): 16 ng/L (ref 0–22)

## 2022-10-13 LAB — D-DIMER, QUANTITATIVE: D-DIMER: <0.2 mg{FEU}/L (ref 0.00–0.49)

## 2022-10-13 MED ORDER — NYSTATIN 100000 UNIT/ML MT SUSP
5.0000 mL | Freq: Four times a day (QID) | OROMUCOSAL | 0 refills | Status: DC
Start: 2022-10-13 — End: 2022-10-13

## 2022-10-13 MED ORDER — NYSTATIN 100000 UNIT/ML MT SUSP: 5.0000 mL | Freq: Four times a day (QID) | OROMUCOSAL | 0 refills | Status: DC

## 2022-10-18 ENCOUNTER — Ambulatory Visit (INDEPENDENT_AMBULATORY_CARE_PROVIDER_SITE_OTHER): Payer: Medicare Other | Admitting: Family Medicine

## 2022-10-18 ENCOUNTER — Encounter: Payer: Self-pay | Admitting: Family Medicine

## 2022-10-18 DIAGNOSIS — K1379 Other lesions of oral mucosa: Secondary | ICD-10-CM | POA: Diagnosis not present

## 2022-10-18 MED ORDER — CEPHALEXIN 500 MG PO CAPS: 500.0000 mg | ORAL_CAPSULE | Freq: Three times a day (TID) | ORAL | 0 refills | Status: AC

## 2022-10-19 MED ORDER — NYSTATIN 100000 UNIT/ML MT SUSP: 5.0000 mL | Freq: Four times a day (QID) | OROMUCOSAL | 0 refills | Status: DC

## 2022-10-19 NOTE — Progress Notes (Signed)
Established patient visit   Patient: Edward Mejia   DOB: July 15, 1945   77 y.o. Male  MRN: [id number] Visit Date: 10/18/2022  Today's healthcare provider: Mila Merry, MD   Chief Complaint  Patient presents with   Medical Management of Chronic Issues    Follow up on mouth ulcers, patient injured left thumb on Saturday, has become swollen, applied alcohol only    Subjective    Discussed the use of AI scribe software for clinical note transcription with the patient, who gave verbal consent to proceed.  History of Present Illness   The patient, with a recent history of oral ulcers and cough, reports significant improvement in symptoms after starting magic mouthwash prescribed by Debera Lat three days ago. He notes that the mouth ulcers are clearing up and the cough has ceased. The patient also mentions that the mouthwash has helped with a dry throat, which he believes may have been contributing to the cough.  In addition to the oral symptoms, the patient reports a recent incident where he accidentally cut their thumb with a pocket knife. The wound has since become red and swollen, with a small amount of clear drainage. The patient denies any known allergies to antibiotics.       Medications: Outpatient Medications Prior to Visit  Medication Sig   acetaminophen (TYLENOL) 500 MG tablet Take 1,000 mg by mouth 3 (three) times daily as needed for mild pain or headache.    albuterol (VENTOLIN HFA) 108 (90 Base) MCG/ACT inhaler Inhale 2 puffs into the lungs every 6 (six) hours as needed for wheezing or shortness of breath.   apixaban (ELIQUIS) 2.5 MG TABS tablet Take 1 tablet (2.5 mg total) by mouth 2 (two) times daily.   augmented betamethasone dipropionate (DIPROLENE-AF) 0.05 % cream Apply topically 2 (two) times daily as needed.   etanercept (ENBREL) 50 MG/ML injection Inject 50 mg into the skin every Thursday.    gabapentin (NEURONTIN) 300 MG capsule Take 900 mg by mouth See  admin instructions. At 4 pm   HYDROcodone-acetaminophen (NORCO) 10-325 MG tablet Take 1 tablet by mouth every 6 (six) hours as needed.   hydrocortisone 2.5 % cream APPLY TOPICALLY TO THE AFFECTED AREA TWICE DAILY AS NEEDED   hydroxyurea (HYDREA) 500 MG capsule Take 1 capsule (500 mg total) by mouth See admin instructions. May take with food to minimize GI side effects. Take 500mg  on Sundays, and 1000 mg daily for the rest of the week.   isosorbide mononitrate (IMDUR) 120 MG 24 hr tablet Take 120 mg by mouth daily.    magic mouthwash (nystatin, lidocaine, diphenhydrAMINE, alum & mag hydroxide) suspension Swish and spit 5 mLs 4 (four) times daily. 30 mL viscous lidocaine 2% 30 mL Mylanta. 30 mL diphenhydramine (Benadryl) at 12.5 mg per 5 ml elixir. 30 mL nystatin (100,000U per 5 mL) suspension. 30 mL prednisolone at 15mg  per 5ml solution. 30 mL distilled water.   nitroGLYCERIN (NITROSTAT) 0.4 MG SL tablet Place 0.4 mg under the tongue every 5 (five) minutes as needed for chest pain.   predniSONE (DELTASONE) 5 MG tablet Take 7.5 mg by mouth every morning.   ranolazine (RANEXA) 1000 MG SR tablet Take 1,000 mg by mouth 2 (two) times daily.   valsartan (DIOVAN) 160 MG tablet TAKE 1 TABLET(160 MG) BY MOUTH DAILY   No facility-administered medications prior to visit.    Objective    BP (!) 148/67 (BP Location: Left Arm, Patient Position: Sitting, Cuff Size:  Large)   Pulse 62   Ht 6' (1.829 m)   Wt 215 lb 11.2 oz (97.8 kg)   SpO2 99%   BMI 29.25 kg/m     Physical Exam   HEENT: Oral mucosa irritated, dentures present. CHEST: Clear to auscultation. SKIN: Thumb swelling, redness, minimal drainage.      Assessment & Plan        Oral Ulcers Improvement noted with use of prescribed mouthwash. Possible association with cough due to throat irritation. -Continue use of magic mouthwash. -Place a refill for mouthwash on hold at the pharmacy for future use if needed.  Thumb Infection Recent  injury to thumb with subsequent swelling and redness. No reported drainage. -Prescribe cephalexin  Cough Improvement noted, possibly related to treatment of oral ulcers. -No further intervention needed at this time.    No follow-ups on file.      Mila Merry, MD  [facility] [phone] (phone) [phone] (fax)  [facility]

## 2022-10-20 DIAGNOSIS — L4050 Arthropathic psoriasis, unspecified: Secondary | ICD-10-CM | POA: Diagnosis not present

## 2022-10-20 DIAGNOSIS — M19012 Primary osteoarthritis, left shoulder: Secondary | ICD-10-CM | POA: Diagnosis not present

## 2022-10-20 DIAGNOSIS — Z79899 Other long term (current) drug therapy: Secondary | ICD-10-CM | POA: Diagnosis not present

## 2022-10-20 DIAGNOSIS — M19011 Primary osteoarthritis, right shoulder: Secondary | ICD-10-CM | POA: Diagnosis not present

## 2022-10-27 ENCOUNTER — Inpatient Hospital Stay: Payer: Medicare Other | Attending: Oncology

## 2022-10-27 ENCOUNTER — Encounter: Payer: Self-pay | Admitting: Oncology

## 2022-10-27 ENCOUNTER — Inpatient Hospital Stay: Payer: Medicare Other

## 2022-10-27 ENCOUNTER — Inpatient Hospital Stay: Payer: Medicare Other | Admitting: Oncology

## 2022-10-27 VITALS — BP 141/68 | HR 60 | Temp 96.0°F | Wt 215.0 lb

## 2022-10-27 DIAGNOSIS — I25110 Atherosclerotic heart disease of native coronary artery with unstable angina pectoris: Secondary | ICD-10-CM | POA: Diagnosis not present

## 2022-10-27 DIAGNOSIS — R2689 Other abnormalities of gait and mobility: Secondary | ICD-10-CM | POA: Insufficient documentation

## 2022-10-27 DIAGNOSIS — D72820 Lymphocytosis (symptomatic): Secondary | ICD-10-CM | POA: Diagnosis not present

## 2022-10-27 DIAGNOSIS — Z7901 Long term (current) use of anticoagulants: Secondary | ICD-10-CM | POA: Diagnosis not present

## 2022-10-27 DIAGNOSIS — D45 Polycythemia vera: Secondary | ICD-10-CM | POA: Insufficient documentation

## 2022-10-27 DIAGNOSIS — I10 Essential (primary) hypertension: Secondary | ICD-10-CM | POA: Insufficient documentation

## 2022-10-27 DIAGNOSIS — E538 Deficiency of other specified B group vitamins: Secondary | ICD-10-CM | POA: Insufficient documentation

## 2022-10-27 DIAGNOSIS — Z7982 Long term (current) use of aspirin: Secondary | ICD-10-CM | POA: Diagnosis not present

## 2022-10-27 DIAGNOSIS — Z7952 Long term (current) use of systemic steroids: Secondary | ICD-10-CM | POA: Insufficient documentation

## 2022-10-27 DIAGNOSIS — M069 Rheumatoid arthritis, unspecified: Secondary | ICD-10-CM | POA: Insufficient documentation

## 2022-10-27 DIAGNOSIS — L4050 Arthropathic psoriasis, unspecified: Secondary | ICD-10-CM | POA: Insufficient documentation

## 2022-10-27 DIAGNOSIS — Z86711 Personal history of pulmonary embolism: Secondary | ICD-10-CM

## 2022-10-27 LAB — CBC WITH DIFFERENTIAL (CANCER CENTER ONLY)
Abs Immature Granulocytes: 0.09 10*3/uL — ABNORMAL HIGH (ref 0.00–0.07)
Basophils Absolute: 0.1 10*3/uL (ref 0.0–0.1)
Basophils Relative: 1 %
Eosinophils Absolute: 0 10*3/uL (ref 0.0–0.5)
Eosinophils Relative: 1 %
HCT: 43.9 % (ref 39.0–52.0)
Hemoglobin: 14.5 g/dL (ref 13.0–17.0)
Immature Granulocytes: 1 %
Lymphocytes Relative: 34 %
Lymphs Abs: 3 10*3/uL (ref 0.7–4.0)
MCH: 37 pg — ABNORMAL HIGH (ref 26.0–34.0)
MCHC: 33 g/dL (ref 30.0–36.0)
MCV: 112 fL — ABNORMAL HIGH (ref 80.0–100.0)
Monocytes Absolute: 0.6 10*3/uL (ref 0.1–1.0)
Monocytes Relative: 6 %
Neutro Abs: 5 10*3/uL (ref 1.7–7.7)
Neutrophils Relative %: 57 %
Platelet Count: 139 10*3/uL — ABNORMAL LOW (ref 150–400)
RBC: 3.92 MIL/uL — ABNORMAL LOW (ref 4.22–5.81)
RDW: 13.8 % (ref 11.5–15.5)
WBC Count: 8.8 10*3/uL (ref 4.0–10.5)
nRBC: 0 % (ref 0.0–0.2)

## 2022-10-27 LAB — CMP (CANCER CENTER ONLY)
ALT: 16 U/L (ref 0–44)
AST: 15 U/L (ref 15–41)
Albumin: 3.8 g/dL (ref 3.5–5.0)
Alkaline Phosphatase: 30 U/L — ABNORMAL LOW (ref 38–126)
Anion gap: 8 (ref 5–15)
BUN: 13 mg/dL (ref 8–23)
CO2: 27 mmol/L (ref 22–32)
Calcium: 8.5 mg/dL — ABNORMAL LOW (ref 8.9–10.3)
Chloride: 94 mmol/L — ABNORMAL LOW (ref 98–111)
Creatinine: 0.99 mg/dL (ref 0.61–1.24)
GFR, Estimated: >60 mL/min (ref >60)
Glucose, Bld: 98 mg/dL (ref 70–99)
Potassium: 4.1 mmol/L (ref 3.5–5.1)
Sodium: 129 mmol/L — ABNORMAL LOW (ref 135–145)
Total Bilirubin: 1.5 mg/dL — ABNORMAL HIGH (ref 0.3–1.2)
Total Protein: 6.2 g/dL — ABNORMAL LOW (ref 6.5–8.1)

## 2022-10-27 LAB — LACTATE DEHYDROGENASE: LDH: 158 U/L (ref 98–192)

## 2022-10-27 LAB — TSH: TSH: 6.511 u[IU]/mL — ABNORMAL HIGH (ref 0.350–4.500)

## 2022-10-27 MED ORDER — HYDROXYUREA 500 MG PO CAPS: 500.0000 mg | ORAL_CAPSULE | ORAL | 1 refills | Status: DC

## 2022-10-27 NOTE — Assessment & Plan Note (Signed)
continue Elqiuis 2.5mg  BID. -Patient also takes aspirin 81 mg for CAD

## 2022-10-27 NOTE — Progress Notes (Addendum)
Hematology/Oncology Progress note [phone] [phone]     CHIEF COMPLAINTS/REASON FOR VISIT:  Follow-up for polycythemia vera-Jak [id number] mutation  ASSESSMENT & PLAN:   Polycythemia vera (HCC) JAK2 V617F mutation. Labs reviewed and discussed with patient. Stable counts Continue hydroxyurea 500mg  on Sundays, and 1000 mg daily for the rest of the week.  History of pulmonary embolism continue Elqiuis 2.5mg  BID. -Patient also takes aspirin 81 mg for CAD  Monoclonal B-cell lymphocytosis of undetermined significance flowcytometry showed stable monoclonal lymphocytosis clone, <5000 Stable white count.  Continue observation   Balance problem Check B12, protein electrophoresis. Hydroxyurea side effect? 9% dizziness, although this is a  new problem for himm rule out other etiology Discussed about obtaining brain MRI, patient is reluctant.  Refer to neurology  B12 deficiency B12 came back low, may attribute to his balancing issue.  Will arrange patient to start B12 daily x 5 followed by weekly x 4 followed by monthly B12 injections.    Orders Placed This Encounter  Procedures   Vitamin B12    Standing Status:   Future    Number of Occurrences:   1    Standing Expiration Date:   10/27/2023   Multiple Myeloma Panel (SPEP&IFE w/QIG)    Standing Status:   Future    Number of Occurrences:   1    Standing Expiration Date:   10/27/2023   Kappa/lambda light chains    Standing Status:   Future    Number of Occurrences:   1    Standing Expiration Date:   10/27/2023   TSH    Standing Status:   Future    Number of Occurrences:   1    Standing Expiration Date:   10/27/2023   Flow cytometry panel-leukemia/lymphoma work-up    Standing Status:   Future    Number of Occurrences:   1    Standing Expiration Date:   10/27/2023   Lactate dehydrogenase    Standing Status:   Future    Number of Occurrences:   1    Standing Expiration Date:   10/27/2023   Follow  up 3 months All questions were answered. The patient knows to call the clinic with any problems, questions or concerns.  [name], MD, PhD [facility] Hematology Oncology 10/27/2022      HISTORY OF PRESENTING ILLNESS:   Edward Mejia is a  77 y.o.  male with PMH including rheumatoid arthritis, peripheral vascular disease hypertension, Covid infection history,, was seen in consultation at the request of  Fisher, Demetrios Isaacs, MD  for evaluation of polycytosis Patient was accompanied by her wife who participated in providing medical history. February 2021, patient was hospitalized due to COVID-19 pneumonia, respiratory failure, treated with steroids, remdesivir, received bamlanivimab.  Hospitalization was also completed by [facility], bleeding was  thought to be secondary to hemorrhoids.  He was discharged on 04/07/2019 and presented back to emergency room on 04/12/2019 due to left lower extremity swelling, acute hypoxic respiratory failure.  Work-up showed acute bilateral pulmonary embolism, large left lower extremity DVT, thrombosis and IVC and left external iliac vein.  Patient was started on anticoagulation and transition to Eliquis Patient has been on Eliquis since March 2021.  Patient also takes aspirin.  Patient has history of CAD, follows up with Dr. Lady Gary.  Rheumatoid arthritis on Enbrel once a week.  Also on prednisone 7.5 mg daily. Patient has also been found to have erythrocytosis. 09/20/2019, CBC showed hemoglobin of 17.6, reviewed previous medical  records, erythrocytosis can be traced back to at least 2017.  Patient also has leukocytosis since March 2021.  Leukocytosis has trended down recently and CBC on 09/20/2019 showed a white count of 10.8, predominantly monocytosis.  Patient is a former smoker, 10-12-pack-year smoking history and he stopped smoking in 1984. Currently he has some chronic shortness of breath with exertion.  He has lost weight during his previous hospitalization and has  gained weight since discharge.  Sometimes he feels sweaty.  Patient also reports intermittent dizziness/lightheaded episodes for the past couple of months.  He actually had an episode during today's visit and is spontaneously resolved after few minutes.  Denies any shortness of breath, chest pain, vision changes, focal deficits.  Patient has been referred to establish care with neurology for further evaluation.  #  psoriatic arthritis follows up with [facility] Dr. Cardell Peach.on chronic prednisone.  # skin squamous cell carcinoma removal from his scalp. #09/27/2021 He recently underwent cardiac cath for abnormal stress test and unstable angina s/p drug eluting coronary stent placement.   INTERVAL HISTORY Edward Mejia is a 77 y.o. male who has above history reviewed by me today presents for follow up visit for management of polycythemia vera Patient has been on Eliquis 2.5 mg twice daily.  Takes hydroxyurea 1000mg  daily except 500 mg on Sundays. Appetite has improve weight is relatively stable. He has developed balance problem for 2 months,, lightheaded after position changes. He was not orthostatic at PCP's office    .Review of Systems  Constitutional:  Positive for fatigue. Negative for appetite change, chills, fever and unexpected weight change.  HENT:   Negative for hearing loss and voice change.   Eyes:  Negative for eye problems and icterus.  Respiratory:  Negative for chest tightness, cough and shortness of breath.   Cardiovascular:  Negative for chest pain and leg swelling.  Gastrointestinal:  Negative for abdominal distention, abdominal pain and blood in stool.  Endocrine: Negative for hot flashes.  Genitourinary:  Negative for difficulty urinating, dysuria and frequency.   Musculoskeletal:  Negative for arthralgias.  Skin:  Negative for itching and rash.  Neurological:  Negative for extremity weakness, light-headedness and numbness.  Hematological:  Negative for adenopathy. Does  not bruise/bleed easily.  Psychiatric/Behavioral:  Negative for confusion.     MEDICAL HISTORY:  Past Medical History:  Diagnosis Date   Arthritis    Bilateral pulmonary embolism (HCC) 03/2019   in setting of Covid-19 pneumonia   Cancer (HCC)    skin cancer   Cellulitis    Cellulitis    DVT (deep venous thrombosis) (HCC) 03/2019   in setting of Covid-19 pneumonia   Erythrocytosis 09/20/2019   H/O adenomatous polyp of colon 03/24/2015   HCAP (healthcare-associated pneumonia) 04/12/2019   History of brain disorder: history of amaurosis fugax 03/24/2015   History of pneumonia 04/04/2008   History of rheumatic fever 03/24/2015   DID Have Rheumatic Fever.     Hypertension    Left leg DVT (HCC) 04/12/2019   In setting of Covid-19 started anticoagulation 04-12-2019. Subsequently Dx PCV and long term NOAC per hematology    Peptic ulcer disease    Sepsis (HCC) 04/12/2019   secondary to Covid pneumonia    SURGICAL HISTORY: Past Surgical History:  Procedure Laterality Date   APPENDECTOMY     LEFT HEART CATH AND CORONARY ANGIOGRAPHY Left 02/06/2018   Procedure: LEFT HEART CATH AND CORONARY ANGIOGRAPHY;  Surgeon: Dalia Heading, MD;  Location: [facility] INVASIVE CV LAB;  Service: Cardiovascular;  Laterality: Left;   MANDIBLE FRACTURE SURGERY     fractured jaw   PARTIAL GASTRECTOMY     peptic ulcer disease    SOCIAL HISTORY: Social History   Socioeconomic History   Marital status: Married    Spouse name: Andrey Campanile    Number of children: 4   Years of education: Not on file   Highest education level: 8th grade  Occupational History   Occupation: Disabled    Comment: due to Psoriatic Arthritis  Tobacco Use   Smoking status: Former    Current packs/day: 0.00    Types: Cigarettes    Quit date: 09/20/1982    Years since quitting: 40.1   Smokeless tobacco: Never   Tobacco comments:    quit over 40+ years ago  Vaping Use   Vaping status: Never Used  Substance and Sexual Activity   Alcohol  use: No    Alcohol/week: 0.0 standard drinks of alcohol   Drug use: No   Sexual activity: Not on file  Other Topics Concern   Not on file  Social History Narrative   Not on file   Social Determinants of Health   Financial Resource Strain: Low Risk  (09/20/2022)   Received from [facility]   Overall Financial Resource Strain (CARDIA)    Difficulty of Paying Living Expenses: Not hard at all  Food Insecurity: No Food Insecurity (09/20/2022)   Received from [facility]   Hunger Vital Sign    Worried About Running Out of Food in the Last Year: Never true    Ran Out of Food in the Last Year: Never true  Transportation Needs: No Transportation Needs (09/20/2022)   Received from [facility] - Transportation    Lack of Transportation (Medical): No    Lack of Transportation (Non-Medical): No  Physical Activity: Unknown (01/08/2019)   Exercise Vital Sign    Days of Exercise per Week: Not on file    Minutes of Exercise per Session: 0 min  Stress: No Stress Concern Present (01/08/2019)   [employer] [facility] [employer] - Occupational Stress Questionnaire    Feeling of Stress : Not at all  Social Connections: Unknown (06/19/2021)   Received from [facility], [facility]   Social Network    Social Network: Not on file  Intimate Partner Violence: Unknown (05/11/2021)   [facility], [facility]   HITS    Physically Hurt: Not on file    Insult or Talk Down To: Not on file    Threaten Physical Harm: Not on file    Scream or Curse: Not on file    FAMILY HISTORY: Family History  Problem Relation Age of Onset   Cancer Mother        lung cancer   Heart disease Father 50       heart attack   Cancer Brother        pancreatic cancer   Cancer Brother        pancreatic cancer    ALLERGIES:  is allergic to pravachol [pravastatin].  MEDICATIONS:  Current Outpatient Medications  Medication Sig Dispense Refill   acetaminophen (TYLENOL) 500 MG  tablet Take 1,000 mg by mouth 3 (three) times daily as needed for mild pain or headache.      albuterol (VENTOLIN HFA) 108 (90 Base) MCG/ACT inhaler Inhale 2 puffs into the lungs every 6 (six) hours as needed for wheezing or shortness of breath.     apixaban (ELIQUIS)  2.5 MG TABS tablet Take 1 tablet (2.5 mg total) by mouth 2 (two) times daily. 180 tablet 3   augmented betamethasone dipropionate (DIPROLENE-AF) 0.05 % cream Apply topically 2 (two) times daily as needed.     etanercept (ENBREL) 50 MG/ML injection Inject 50 mg into the skin every Thursday.      gabapentin (NEURONTIN) 300 MG capsule Take 900 mg by mouth See admin instructions. At 4 pm     HYDROcodone-acetaminophen (NORCO) 10-325 MG tablet Take 1 tablet by mouth every 6 (six) hours as needed.     hydrocortisone 2.5 % cream APPLY TOPICALLY TO THE AFFECTED AREA TWICE DAILY AS NEEDED 30 g 3   isosorbide mononitrate (IMDUR) 120 MG 24 hr tablet Take 120 mg by mouth daily.      magic mouthwash (nystatin, lidocaine, diphenhydrAMINE, alum & mag hydroxide) suspension Swish and spit 5 mLs 4 (four) times daily. 30 mL viscous lidocaine 2% 30 mL Mylanta. 30 mL diphenhydramine (Benadryl) at 12.5 mg per 5 ml elixir. 30 mL nystatin (100,000U per 5 mL) suspension. 30 mL prednisolone at 15mg  per 5ml solution. 30 mL distilled water. 180 mL 0   nitroGLYCERIN (NITROSTAT) 0.4 MG SL tablet Place 0.4 mg under the tongue every 5 (five) minutes as needed for chest pain.     predniSONE (DELTASONE) 5 MG tablet Take 7.5 mg by mouth every morning.     ranolazine (RANEXA) 1000 MG SR tablet Take 1,000 mg by mouth 2 (two) times daily.     valsartan (DIOVAN) 160 MG tablet TAKE 1 TABLET(160 MG) BY MOUTH DAILY 90 tablet 4   hydroxyurea (HYDREA) 500 MG capsule Take 1 capsule (500 mg total) by mouth See admin instructions. May take with food to minimize GI side effects. Take 500mg  on Sundays, and 1000 mg daily for the rest of the week. 180 capsule 1   No current  facility-administered medications for this visit.     PHYSICAL EXAMINATION: ECOG PERFORMANCE STATUS: 1 - Symptomatic but completely ambulatory Today's Vitals   10/27/22 1110  BP: (!) 141/68  Pulse: 60  Temp: (!) 96 F (35.6 C)  TempSrc: Tympanic  SpO2: 100%  Weight: 215 lb (97.5 kg)  PainSc: 0-No pain   Body mass index is 29.16 kg/m.  Marland Kitchen Physical Exam Constitutional:      General: He is not in acute distress. HENT:     Head: Normocephalic and atraumatic.  Eyes:     General: No scleral icterus. Cardiovascular:     Rate and Rhythm: Normal rate and regular rhythm.  Pulmonary:     Effort: Pulmonary effort is normal. No respiratory distress.  Abdominal:     General: Bowel sounds are normal.     Palpations: Abdomen is soft.  Musculoskeletal:        General: No deformity. Normal range of motion.     Cervical back: Normal range of motion and neck supple.  Skin:    General: Skin is warm and dry.  Neurological:     Mental Status: He is alert and oriented to person, place, and time. Mental status is at baseline.  Psychiatric:        Mood and Affect: Mood normal.     LABORATORY DATA:  I have reviewed the data as listed    Latest Ref Rng & Units 10/27/2022   10:35 AM 10/12/2022   10:06 AM 07/27/2022    1:01 PM  CBC  WBC 4.0 - 10.5 K/uL 8.8  8.5  9.3   Hemoglobin  13.0 - 17.0 g/dL 09.8  11.9  14.7   Hematocrit 39.0 - 52.0 % 43.9  45.5  42.3   Platelets 150 - 400 K/uL 139  147  177       Latest Ref Rng & Units 10/27/2022   10:34 AM 10/12/2022   10:06 AM 07/27/2022    1:01 PM  CMP  Glucose 70 - 99 mg/dL 98  86  96   BUN 8 - 23 mg/dL 13  13  13    Creatinine 0.61 - 1.24 mg/dL 8.29  5.62  1.30   Sodium 135 - 145 mmol/L 129  135  131   Potassium 3.5 - 5.1 mmol/L 4.1  5.0  4.6   Chloride 98 - 111 mmol/L 94  97  95   CO2 22 - 32 mmol/L 27  25  28    Calcium 8.9 - 10.3 mg/dL 8.5  9.6  8.6   Total Protein 6.5 - 8.1 g/dL 6.2  6.0  6.1   Total Bilirubin 0.3 - 1.2 mg/dL 1.5  1.1   1.2   Alkaline Phos 38 - 126 U/L 30  41  29   AST 15 - 41 U/L 15  15  15    ALT 0 - 44 U/L 16  13  12       Iron/TIBC/Ferritin/ %Sat    Component Value Date/Time   IRON 49 09/20/2019 1201   TIBC 318 09/20/2019 1201   FERRITIN 24 09/20/2019 1201   IRONPCTSAT 15 (L) 09/20/2019 1201      RADIOGRAPHIC STUDIES: I have personally reviewed the radiological images as listed and agreed with the findings in the report. DG Chest 2 View  Result Date: 10/20/2022 CLINICAL DATA:  Abnormal lung sounds, chest pain, hx of CAD/stents, leg swelling EXAM: CHEST - 2 VIEW COMPARISON:  01/25/2022 FINDINGS: Cardiac silhouette is unremarkable. No pneumothorax or pleural effusion. Right hemidiaphragm elevated. The lungs are clear. Aorta calcified. Osseous structures are osteopenic. Mild lower thoracic compression deformity is unchanged. IMPRESSION: No acute cardiopulmonary process. Electronically Signed   By: Layla Maw M.D.   On: 10/20/2022 18:26

## 2022-10-27 NOTE — Assessment & Plan Note (Addendum)
Check B12, protein electrophoresis. Hydroxyurea side effect? 9% dizziness, although this is a  new problem for himm rule out other etiology Discussed about obtaining brain MRI, patient is reluctant.  Refer to neurology

## 2022-10-27 NOTE — Assessment & Plan Note (Signed)
flowcytometry showed stable monoclonal lymphocytosis clone, <5000 Stable white count.  Continue observation

## 2022-10-27 NOTE — Progress Notes (Signed)
Patient has been having some light headedness and off balance for a couple of months now,

## 2022-10-27 NOTE — Assessment & Plan Note (Addendum)
JAK2 V617F mutation. Labs reviewed and discussed with patient. Stable counts Continue hydroxyurea 500mg  on Sundays, and 1000 mg daily for the rest of the week.

## 2022-10-28 ENCOUNTER — Encounter: Payer: Self-pay | Admitting: Oncology

## 2022-10-28 DIAGNOSIS — E538 Deficiency of other specified B group vitamins: Secondary | ICD-10-CM | POA: Insufficient documentation

## 2022-10-28 LAB — KAPPA/LAMBDA LIGHT CHAINS
Kappa free light chain: 16.2 mg/L (ref 3.3–19.4)
Kappa, lambda light chain ratio: 0.92 (ref 0.26–1.65)
Lambda free light chains: 17.6 mg/L (ref 5.7–26.3)

## 2022-10-28 LAB — VITAMIN B12: Vitamin B-12: 136 pg/mL — ABNORMAL LOW (ref 180–914)

## 2022-10-28 NOTE — Addendum Note (Signed)
Addended by: [name] on: 10/28/2022 10:57 PM   Modules accepted: Orders

## 2022-10-28 NOTE — Assessment & Plan Note (Signed)
B12 came back low, may attribute to his balancing issue.  Will arrange patient to start B12 daily x 5 followed by weekly x 4 followed by monthly B12 injections.

## 2022-10-31 ENCOUNTER — Inpatient Hospital Stay: Payer: Medicare Other

## 2022-10-31 ENCOUNTER — Telehealth: Payer: Self-pay

## 2022-10-31 DIAGNOSIS — E538 Deficiency of other specified B group vitamins: Secondary | ICD-10-CM

## 2022-10-31 DIAGNOSIS — L4050 Arthropathic psoriasis, unspecified: Secondary | ICD-10-CM | POA: Diagnosis not present

## 2022-10-31 DIAGNOSIS — D72820 Lymphocytosis (symptomatic): Secondary | ICD-10-CM | POA: Diagnosis not present

## 2022-10-31 DIAGNOSIS — R2689 Other abnormalities of gait and mobility: Secondary | ICD-10-CM | POA: Diagnosis not present

## 2022-10-31 DIAGNOSIS — D45 Polycythemia vera: Secondary | ICD-10-CM | POA: Diagnosis not present

## 2022-10-31 DIAGNOSIS — Z7952 Long term (current) use of systemic steroids: Secondary | ICD-10-CM | POA: Diagnosis not present

## 2022-10-31 DIAGNOSIS — D751 Secondary polycythemia: Secondary | ICD-10-CM

## 2022-10-31 DIAGNOSIS — Z7982 Long term (current) use of aspirin: Secondary | ICD-10-CM | POA: Diagnosis not present

## 2022-10-31 DIAGNOSIS — I25110 Atherosclerotic heart disease of native coronary artery with unstable angina pectoris: Secondary | ICD-10-CM | POA: Diagnosis not present

## 2022-10-31 DIAGNOSIS — M069 Rheumatoid arthritis, unspecified: Secondary | ICD-10-CM | POA: Diagnosis not present

## 2022-10-31 DIAGNOSIS — Z7901 Long term (current) use of anticoagulants: Secondary | ICD-10-CM | POA: Diagnosis not present

## 2022-10-31 DIAGNOSIS — Z86711 Personal history of pulmonary embolism: Secondary | ICD-10-CM | POA: Diagnosis not present

## 2022-10-31 DIAGNOSIS — I10 Essential (primary) hypertension: Secondary | ICD-10-CM | POA: Diagnosis not present

## 2022-10-31 MED ORDER — CYANOCOBALAMIN 1000 MCG/ML IJ SOLN
1000.0000 ug | Freq: Once | INTRAMUSCULAR | Status: AC
  Administered 2022-10-31: 1000 ug via INTRAMUSCULAR
  Filled 2022-10-31: qty 1

## 2022-10-31 NOTE — Telephone Encounter (Signed)
-----   Message from [name] sent at 10/28/2022 10:54 PM EDT ----- B12 deficiency please arrange patient to get B12 injection daily x 5 followed by weekly x 4, followed by monthly x 2  Add B12 level with his next lab draw, and add B12 injection to next visit thanks.

## 2022-10-31 NOTE — Telephone Encounter (Signed)
Spoke to Mrs. Edward Mejia and informed her of MD recommendation. She said they can be here this afternoon for first injection.   Please schedule : B12 inj daily x5 (first dose today at 2:30p).  Followed by weekly x4,  Then monthly x2 Add B12 inj to visit in Dec  They will pick up AVS this afternoon when they come for injection.

## 2022-11-01 ENCOUNTER — Inpatient Hospital Stay: Payer: Medicare Other

## 2022-11-01 DIAGNOSIS — I10 Essential (primary) hypertension: Secondary | ICD-10-CM | POA: Diagnosis not present

## 2022-11-01 DIAGNOSIS — E538 Deficiency of other specified B group vitamins: Secondary | ICD-10-CM | POA: Diagnosis not present

## 2022-11-01 DIAGNOSIS — Z7982 Long term (current) use of aspirin: Secondary | ICD-10-CM | POA: Diagnosis not present

## 2022-11-01 DIAGNOSIS — Z86711 Personal history of pulmonary embolism: Secondary | ICD-10-CM | POA: Diagnosis not present

## 2022-11-01 DIAGNOSIS — D72820 Lymphocytosis (symptomatic): Secondary | ICD-10-CM | POA: Diagnosis not present

## 2022-11-01 DIAGNOSIS — Z7901 Long term (current) use of anticoagulants: Secondary | ICD-10-CM | POA: Diagnosis not present

## 2022-11-01 DIAGNOSIS — Z7952 Long term (current) use of systemic steroids: Secondary | ICD-10-CM | POA: Diagnosis not present

## 2022-11-01 DIAGNOSIS — L4050 Arthropathic psoriasis, unspecified: Secondary | ICD-10-CM | POA: Diagnosis not present

## 2022-11-01 DIAGNOSIS — R2689 Other abnormalities of gait and mobility: Secondary | ICD-10-CM | POA: Diagnosis not present

## 2022-11-01 DIAGNOSIS — D45 Polycythemia vera: Secondary | ICD-10-CM | POA: Diagnosis not present

## 2022-11-01 DIAGNOSIS — D751 Secondary polycythemia: Secondary | ICD-10-CM

## 2022-11-01 DIAGNOSIS — M069 Rheumatoid arthritis, unspecified: Secondary | ICD-10-CM | POA: Diagnosis not present

## 2022-11-01 DIAGNOSIS — I25110 Atherosclerotic heart disease of native coronary artery with unstable angina pectoris: Secondary | ICD-10-CM | POA: Diagnosis not present

## 2022-11-01 LAB — MULTIPLE MYELOMA PANEL, SERUM
Albumin SerPl Elph-Mcnc: 3.9 g/dL (ref 2.9–4.4)
Albumin/Glob SerPl: 1.9 — ABNORMAL HIGH (ref 0.7–1.7)
Alpha 1: 0.2 g/dL (ref 0.0–0.4)
Alpha2 Glob SerPl Elph-Mcnc: 0.6 g/dL (ref 0.4–1.0)
B-Globulin SerPl Elph-Mcnc: 0.7 g/dL (ref 0.7–1.3)
Gamma Glob SerPl Elph-Mcnc: 0.6 g/dL (ref 0.4–1.8)
Globulin, Total: 2.1 g/dL — ABNORMAL LOW (ref 2.2–3.9)
IgA: 88 mg/dL (ref 61–437)
IgG (Immunoglobin G), Serum: 670 mg/dL (ref 603–1613)
IgM (Immunoglobulin M), Srm: 78 mg/dL (ref 15–143)
Total Protein ELP: 6 g/dL (ref 6.0–8.5)

## 2022-11-01 MED ORDER — CYANOCOBALAMIN 1000 MCG/ML IJ SOLN
1000.0000 ug | Freq: Once | INTRAMUSCULAR | Status: AC
  Administered 2022-11-01: 1000 ug via INTRAMUSCULAR
  Filled 2022-11-01: qty 1

## 2022-11-02 ENCOUNTER — Inpatient Hospital Stay: Payer: Medicare Other

## 2022-11-02 DIAGNOSIS — D751 Secondary polycythemia: Secondary | ICD-10-CM

## 2022-11-02 DIAGNOSIS — I25110 Atherosclerotic heart disease of native coronary artery with unstable angina pectoris: Secondary | ICD-10-CM | POA: Diagnosis not present

## 2022-11-02 DIAGNOSIS — D45 Polycythemia vera: Secondary | ICD-10-CM | POA: Diagnosis not present

## 2022-11-02 DIAGNOSIS — L4050 Arthropathic psoriasis, unspecified: Secondary | ICD-10-CM | POA: Diagnosis not present

## 2022-11-02 DIAGNOSIS — Z7901 Long term (current) use of anticoagulants: Secondary | ICD-10-CM | POA: Diagnosis not present

## 2022-11-02 DIAGNOSIS — Z86711 Personal history of pulmonary embolism: Secondary | ICD-10-CM | POA: Diagnosis not present

## 2022-11-02 DIAGNOSIS — M069 Rheumatoid arthritis, unspecified: Secondary | ICD-10-CM | POA: Diagnosis not present

## 2022-11-02 DIAGNOSIS — E538 Deficiency of other specified B group vitamins: Secondary | ICD-10-CM | POA: Diagnosis not present

## 2022-11-02 DIAGNOSIS — D72820 Lymphocytosis (symptomatic): Secondary | ICD-10-CM | POA: Diagnosis not present

## 2022-11-02 DIAGNOSIS — R2689 Other abnormalities of gait and mobility: Secondary | ICD-10-CM | POA: Diagnosis not present

## 2022-11-02 DIAGNOSIS — Z7952 Long term (current) use of systemic steroids: Secondary | ICD-10-CM | POA: Diagnosis not present

## 2022-11-02 DIAGNOSIS — I10 Essential (primary) hypertension: Secondary | ICD-10-CM | POA: Diagnosis not present

## 2022-11-02 DIAGNOSIS — Z7982 Long term (current) use of aspirin: Secondary | ICD-10-CM | POA: Diagnosis not present

## 2022-11-02 LAB — COMP PANEL: LEUKEMIA/LYMPHOMA: Immunophenotypic Profile: 19

## 2022-11-02 MED ORDER — CYANOCOBALAMIN 1000 MCG/ML IJ SOLN
1000.0000 ug | Freq: Once | INTRAMUSCULAR | Status: AC
  Administered 2022-11-02: 1000 ug via INTRAMUSCULAR
  Filled 2022-11-02: qty 1

## 2022-11-03 ENCOUNTER — Inpatient Hospital Stay: Payer: Medicare Other

## 2022-11-03 DIAGNOSIS — D751 Secondary polycythemia: Secondary | ICD-10-CM

## 2022-11-03 DIAGNOSIS — I10 Essential (primary) hypertension: Secondary | ICD-10-CM | POA: Diagnosis not present

## 2022-11-03 DIAGNOSIS — L4050 Arthropathic psoriasis, unspecified: Secondary | ICD-10-CM | POA: Diagnosis not present

## 2022-11-03 DIAGNOSIS — R2689 Other abnormalities of gait and mobility: Secondary | ICD-10-CM | POA: Diagnosis not present

## 2022-11-03 DIAGNOSIS — Z7982 Long term (current) use of aspirin: Secondary | ICD-10-CM | POA: Diagnosis not present

## 2022-11-03 DIAGNOSIS — Z7901 Long term (current) use of anticoagulants: Secondary | ICD-10-CM | POA: Diagnosis not present

## 2022-11-03 DIAGNOSIS — Z7952 Long term (current) use of systemic steroids: Secondary | ICD-10-CM | POA: Diagnosis not present

## 2022-11-03 DIAGNOSIS — E538 Deficiency of other specified B group vitamins: Secondary | ICD-10-CM | POA: Diagnosis not present

## 2022-11-03 DIAGNOSIS — I25110 Atherosclerotic heart disease of native coronary artery with unstable angina pectoris: Secondary | ICD-10-CM | POA: Diagnosis not present

## 2022-11-03 DIAGNOSIS — Z86711 Personal history of pulmonary embolism: Secondary | ICD-10-CM | POA: Diagnosis not present

## 2022-11-03 DIAGNOSIS — D72820 Lymphocytosis (symptomatic): Secondary | ICD-10-CM | POA: Diagnosis not present

## 2022-11-03 DIAGNOSIS — D45 Polycythemia vera: Secondary | ICD-10-CM | POA: Diagnosis not present

## 2022-11-03 DIAGNOSIS — M069 Rheumatoid arthritis, unspecified: Secondary | ICD-10-CM | POA: Diagnosis not present

## 2022-11-03 MED ORDER — CYANOCOBALAMIN 1000 MCG/ML IJ SOLN
1000.0000 ug | Freq: Once | INTRAMUSCULAR | Status: AC
  Administered 2022-11-03: 1000 ug via INTRAMUSCULAR
  Filled 2022-11-03: qty 1

## 2022-11-04 ENCOUNTER — Inpatient Hospital Stay: Payer: Medicare Other

## 2022-11-04 ENCOUNTER — Ambulatory Visit: Payer: Medicare Other | Admitting: Internal Medicine

## 2022-11-04 DIAGNOSIS — L4050 Arthropathic psoriasis, unspecified: Secondary | ICD-10-CM | POA: Diagnosis not present

## 2022-11-04 DIAGNOSIS — D72820 Lymphocytosis (symptomatic): Secondary | ICD-10-CM | POA: Diagnosis not present

## 2022-11-04 DIAGNOSIS — Z7901 Long term (current) use of anticoagulants: Secondary | ICD-10-CM | POA: Diagnosis not present

## 2022-11-04 DIAGNOSIS — D45 Polycythemia vera: Secondary | ICD-10-CM | POA: Diagnosis not present

## 2022-11-04 DIAGNOSIS — E538 Deficiency of other specified B group vitamins: Secondary | ICD-10-CM | POA: Diagnosis not present

## 2022-11-04 DIAGNOSIS — Z86711 Personal history of pulmonary embolism: Secondary | ICD-10-CM | POA: Diagnosis not present

## 2022-11-04 DIAGNOSIS — I10 Essential (primary) hypertension: Secondary | ICD-10-CM | POA: Diagnosis not present

## 2022-11-04 DIAGNOSIS — Z7952 Long term (current) use of systemic steroids: Secondary | ICD-10-CM | POA: Diagnosis not present

## 2022-11-04 DIAGNOSIS — R2689 Other abnormalities of gait and mobility: Secondary | ICD-10-CM | POA: Diagnosis not present

## 2022-11-04 DIAGNOSIS — M069 Rheumatoid arthritis, unspecified: Secondary | ICD-10-CM | POA: Diagnosis not present

## 2022-11-04 DIAGNOSIS — I25110 Atherosclerotic heart disease of native coronary artery with unstable angina pectoris: Secondary | ICD-10-CM | POA: Diagnosis not present

## 2022-11-04 DIAGNOSIS — D751 Secondary polycythemia: Secondary | ICD-10-CM

## 2022-11-04 DIAGNOSIS — Z7982 Long term (current) use of aspirin: Secondary | ICD-10-CM | POA: Diagnosis not present

## 2022-11-04 MED ORDER — CYANOCOBALAMIN 1000 MCG/ML IJ SOLN
1000.0000 ug | Freq: Once | INTRAMUSCULAR | Status: AC
  Administered 2022-11-04: 1000 ug via INTRAMUSCULAR

## 2022-11-11 ENCOUNTER — Inpatient Hospital Stay: Payer: Medicare Other | Attending: Oncology

## 2022-11-11 DIAGNOSIS — D751 Secondary polycythemia: Secondary | ICD-10-CM

## 2022-11-11 DIAGNOSIS — E538 Deficiency of other specified B group vitamins: Secondary | ICD-10-CM | POA: Insufficient documentation

## 2022-11-11 MED ORDER — CYANOCOBALAMIN 1000 MCG/ML IJ SOLN
1000.0000 ug | Freq: Once | INTRAMUSCULAR | Status: AC
  Administered 2022-11-11: 1000 ug via INTRAMUSCULAR
  Filled 2022-11-11: qty 1

## 2022-11-18 ENCOUNTER — Inpatient Hospital Stay: Payer: Medicare Other

## 2022-11-18 DIAGNOSIS — E538 Deficiency of other specified B group vitamins: Secondary | ICD-10-CM | POA: Diagnosis not present

## 2022-11-18 DIAGNOSIS — D751 Secondary polycythemia: Secondary | ICD-10-CM

## 2022-11-18 MED ORDER — CYANOCOBALAMIN 1000 MCG/ML IJ SOLN
1000.0000 ug | Freq: Once | INTRAMUSCULAR | Status: AC
  Administered 2022-11-18: 1000 ug via INTRAMUSCULAR
  Filled 2022-11-18: qty 1

## 2022-11-23 DIAGNOSIS — I10 Essential (primary) hypertension: Secondary | ICD-10-CM | POA: Diagnosis not present

## 2022-11-23 DIAGNOSIS — I824Z2 Acute embolism and thrombosis of unspecified deep veins of left distal lower extremity: Secondary | ICD-10-CM | POA: Diagnosis not present

## 2022-11-23 DIAGNOSIS — Z23 Encounter for immunization: Secondary | ICD-10-CM | POA: Diagnosis not present

## 2022-11-23 DIAGNOSIS — I25119 Atherosclerotic heart disease of native coronary artery with unspecified angina pectoris: Secondary | ICD-10-CM | POA: Diagnosis not present

## 2022-11-25 ENCOUNTER — Inpatient Hospital Stay: Payer: Medicare Other

## 2022-11-25 DIAGNOSIS — E538 Deficiency of other specified B group vitamins: Secondary | ICD-10-CM | POA: Diagnosis not present

## 2022-11-25 DIAGNOSIS — D751 Secondary polycythemia: Secondary | ICD-10-CM

## 2022-11-25 MED ORDER — CYANOCOBALAMIN 1000 MCG/ML IJ SOLN
1000.0000 ug | Freq: Once | INTRAMUSCULAR | Status: AC
  Administered 2022-11-25: 1000 ug via INTRAMUSCULAR
  Filled 2022-11-25: qty 1

## 2022-12-02 ENCOUNTER — Inpatient Hospital Stay: Payer: Medicare Other

## 2022-12-02 DIAGNOSIS — E538 Deficiency of other specified B group vitamins: Secondary | ICD-10-CM | POA: Diagnosis not present

## 2022-12-02 DIAGNOSIS — D751 Secondary polycythemia: Secondary | ICD-10-CM

## 2022-12-02 MED ORDER — CYANOCOBALAMIN 1000 MCG/ML IJ SOLN
1000.0000 ug | Freq: Once | INTRAMUSCULAR | Status: AC
  Administered 2022-12-02: 1000 ug via INTRAMUSCULAR

## 2022-12-21 DIAGNOSIS — M51362 Other intervertebral disc degeneration, lumbar region with discogenic back pain and lower extremity pain: Secondary | ICD-10-CM | POA: Diagnosis not present

## 2022-12-21 DIAGNOSIS — Z79891 Long term (current) use of opiate analgesic: Secondary | ICD-10-CM | POA: Diagnosis not present

## 2022-12-21 DIAGNOSIS — M47816 Spondylosis without myelopathy or radiculopathy, lumbar region: Secondary | ICD-10-CM | POA: Diagnosis not present

## 2022-12-21 DIAGNOSIS — M48062 Spinal stenosis, lumbar region with neurogenic claudication: Secondary | ICD-10-CM | POA: Diagnosis not present

## 2022-12-23 ENCOUNTER — Inpatient Hospital Stay: Payer: Medicare Other | Attending: Oncology | Admitting: Internal Medicine

## 2022-12-23 ENCOUNTER — Encounter: Payer: Self-pay | Admitting: Internal Medicine

## 2022-12-23 VITALS — BP 174/73 | HR 67 | Temp 96.0°F | Resp 18 | Ht 72.0 in | Wt 216.0 lb

## 2022-12-23 DIAGNOSIS — E538 Deficiency of other specified B group vitamins: Secondary | ICD-10-CM | POA: Diagnosis not present

## 2022-12-23 DIAGNOSIS — G63 Polyneuropathy in diseases classified elsewhere: Secondary | ICD-10-CM

## 2022-12-23 DIAGNOSIS — R4189 Other symptoms and signs involving cognitive functions and awareness: Secondary | ICD-10-CM | POA: Diagnosis not present

## 2022-12-23 DIAGNOSIS — R270 Ataxia, unspecified: Secondary | ICD-10-CM | POA: Insufficient documentation

## 2022-12-23 DIAGNOSIS — R413 Other amnesia: Secondary | ICD-10-CM

## 2022-12-23 DIAGNOSIS — G629 Polyneuropathy, unspecified: Secondary | ICD-10-CM | POA: Insufficient documentation

## 2022-12-23 DIAGNOSIS — R29818 Other symptoms and signs involving the nervous system: Secondary | ICD-10-CM | POA: Diagnosis not present

## 2022-12-23 MED ORDER — LORAZEPAM 1 MG PO TABS: 1.0000 mg | ORAL_TABLET | Freq: Once | ORAL | 0 refills | Status: DC | PRN

## 2022-12-23 NOTE — Progress Notes (Signed)
[facility] [address]  [place], [place] [address] [phone]   New Patient Evaluation  Date of Service: 12/23/22 Patient Name: Edward Mejia Patient MRN: [id number] Patient DOB: 08-16-1945 Provider: Henreitta Leber, MD  Identifying Statement:  Edward Mejia is a 77 y.o. 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    Referring Provider: Malva Limes, MD [address] [place],  [place] [address]  Primary Cancer: n/a  History of Present Illness: The patient's records from the referring physician were obtained and reviewed and the patient interviewed to confirm this HPI.  Edward Mejia presents to review recent neurologic symptoms.  He describes issues with his balance; he has been "veering off to the left and bumping into objects on his left side".  Has been having to hold onto objects, but not using a cane or walker.  Fortunately no falls.  Acknowledges numbness in bottoms of both feet which has been present for more than a year.  Short term memory has declined drastically, he often forgets conversations or where he places items around the home.  He is no longer managing bills and would not feel comfortable living independently right now because of these issues.  Also describes periods of light-headedness upon standing, no LOC or syncope.  Medications: Current Outpatient Medications on File Prior to Visit  Medication Sig Dispense Refill   acetaminophen (TYLENOL) 500 MG tablet Take 1,000 mg by mouth 3 (three) times daily as needed for mild pain or headache.      apixaban (ELIQUIS) 2.5 MG TABS tablet Take 1 tablet (2.5 mg total) by mouth 2 (two) times daily. 180 tablet 3   augmented betamethasone dipropionate (DIPROLENE-AF)  0.05 % cream Apply topically 2 (two) times daily as needed.     etanercept (ENBREL) 50 MG/ML injection Inject 50 mg into the skin every Thursday.      gabapentin (NEURONTIN) 300 MG capsule Take 900 mg by mouth See admin instructions. At 4 pm     HYDROcodone-acetaminophen (NORCO) 10-325 MG tablet Take 1 tablet by mouth every 6 (six) hours as needed.     hydrocortisone 2.5 % cream APPLY TOPICALLY TO THE AFFECTED AREA TWICE DAILY AS NEEDED 30 g 3   hydroxyurea (HYDREA) 500 MG capsule Take 1 capsule (500 mg total) by mouth See admin instructions. May take with food to minimize GI side effects. Take 500mg  on Sundays, and 1000 mg daily for the rest of the week. 180 capsule 1   isosorbide mononitrate (IMDUR) 120 MG 24 hr tablet Take 120 mg by mouth daily.      predniSONE (DELTASONE) 5 MG tablet Take 7.5 mg by mouth every morning.     ranolazine (RANEXA) 1000 MG SR tablet Take 1,000 mg by mouth 2 (two) times daily.     valsartan (DIOVAN) 160 MG tablet TAKE 1 TABLET(160 MG) BY MOUTH DAILY 90 tablet 4   albuterol (VENTOLIN HFA) 108 (90 Base) MCG/ACT inhaler Inhale 2 puffs into the lungs every 6 (six) hours as needed for wheezing or shortness of breath. (Patient not taking: Reported on 12/23/2022)     magic mouthwash (nystatin, lidocaine, diphenhydrAMINE, alum & mag hydroxide) suspension Swish and spit 5 mLs 4 (four) times daily. 30 mL viscous lidocaine 2% 30 mL Mylanta. 30  mL diphenhydramine (Benadryl) at 12.5 mg per 5 ml elixir. 30 mL nystatin (100,000U per 5 mL) suspension. 30 mL prednisolone at 15mg  per 5ml solution. 30 mL distilled water. (Patient not taking: Reported on 12/23/2022) 180 mL 0   nitroGLYCERIN (NITROSTAT) 0.4 MG SL tablet Place 0.4 mg under the tongue every 5 (five) minutes as needed for chest pain. (Patient not taking: Reported on 12/23/2022)     No current facility-administered medications on file prior to visit.    Allergies:  Allergies  Allergen Reactions   Pravachol  [Pravastatin] Other (See Comments)    Caused FATIGUE   Past Medical History:  Past Medical History:  Diagnosis Date   Arthritis    Bilateral pulmonary embolism (HCC) 03/2019   in setting of Covid-19 pneumonia   Cancer (HCC)    skin cancer   Cellulitis    Cellulitis    DVT (deep venous thrombosis) (HCC) 03/2019   in setting of Covid-19 pneumonia   Erythrocytosis 09/20/2019   H/O adenomatous polyp of colon 03/24/2015   HCAP (healthcare-associated pneumonia) 04/12/2019   History of brain disorder: history of amaurosis fugax 03/24/2015   History of pneumonia 04/04/2008   History of rheumatic fever 03/24/2015   DID Have Rheumatic Fever.     Hypertension    Left leg DVT (HCC) 04/12/2019   In setting of Covid-19 started anticoagulation 04-12-2019. Subsequently Dx PCV and long term NOAC per hematology    Peptic ulcer disease    Sepsis (HCC) 04/12/2019   secondary to Covid pneumonia   Past Surgical History:  Past Surgical History:  Procedure Laterality Date   APPENDECTOMY     LEFT HEART CATH AND CORONARY ANGIOGRAPHY Left 02/06/2018   Procedure: LEFT HEART CATH AND CORONARY ANGIOGRAPHY;  Surgeon: Dalia Heading, MD;  Location: [facility] INVASIVE CV LAB;  Service: Cardiovascular;  Laterality: Left;   MANDIBLE FRACTURE SURGERY     fractured jaw   PARTIAL GASTRECTOMY     peptic ulcer disease   Social History:  Social History   Socioeconomic History   Marital status: Married    Spouse name: Andrey Campanile    Number of children: 4   Years of education: Not on file   Highest education level: 8th grade  Occupational History   Occupation: Disabled    Comment: due to Psoriatic Arthritis  Tobacco Use   Smoking status: Former    Current packs/day: 0.00    Types: Cigarettes    Quit date: 09/20/1982    Years since quitting: 40.2   Smokeless tobacco: Never   Tobacco comments:    quit over 40+ years ago  Vaping Use   Vaping status: Never Used  Substance and Sexual Activity   Alcohol use: No     Alcohol/week: 0.0 standard drinks of alcohol   Drug use: No   Sexual activity: Not on file  Other Topics Concern   Not on file  Social History Narrative   Not on file   Social Determinants of Health   Financial Resource Strain: Low Risk  (09/20/2022)   Received from [facility]   Overall Financial Resource Strain (CARDIA)    Difficulty of Paying Living Expenses: Not hard at all  Food Insecurity: No Food Insecurity (09/20/2022)   Received from [facility]   Hunger Vital Sign    Worried About Running Out of Food in the Last Year: Never true    Ran Out of Food in the Last Year: Never true  Transportation Needs: No Transportation Needs (09/20/2022)  Received from [facility] - Transportation    Lack of Transportation (Medical): No    Lack of Transportation (Non-Medical): No  Physical Activity: Unknown (01/08/2019)   Exercise Vital Sign    Days of Exercise per Week: Not on file    Minutes of Exercise per Session: 0 min  Stress: No Stress Concern Present (01/08/2019)   [employer] of Occupational Health - Occupational Stress Questionnaire    Feeling of Stress : Not at all  Social Connections: Unknown (06/19/2021)   Received from [facility]   Social Network    Social Network: Not on file  Intimate Partner Violence: Unknown (05/11/2021)   [facility] from [facility]   HITS    Physically Hurt: Not on file    Insult or Talk Down To: Not on file    Threaten Physical Harm: Not on file    Scream or Curse: Not on file   Family History:  Family History  Problem Relation Age of Onset   Cancer Mother        lung cancer   Heart disease Father 80       heart attack   Cancer Brother        pancreatic cancer   Cancer Brother        pancreatic cancer    Review of Systems: Constitutional: Doesn't report fevers, chills or abnormal weight loss Eyes: Doesn't report blurriness of vision Ears, nose, mouth, throat, and face: Doesn't report  sore throat Respiratory: Doesn't report cough, dyspnea or wheezes Cardiovascular: Doesn't report palpitation, chest discomfort  Gastrointestinal:  Doesn't report nausea, constipation, diarrhea GU: Doesn't report incontinence Skin: Doesn't report skin rashes Neurological: Per HPI Musculoskeletal: Doesn't report joint pain Behavioral/Psych: Doesn't report anxiety  Physical Exam: Vitals:   12/23/22 1022  BP: (!) 174/73  Pulse: 67  Resp: 18  Temp: (!) 96 F (35.6 C)  SpO2: 100%   KPS: 70. General: Alert, cooperative, pleasant, in no acute distress Head: Normal EENT: No conjunctival injection or scleral icterus.  Lungs: Resp effort normal Cardiac: Regular rate Abdomen: Non-distended abdomen Skin: No rashes cyanosis or petechiae. Extremities: No clubbing or edema  Neurologic Exam: Mental Status: Awake, alert, attentive to examiner. Oriented to self and environment. Language is fluent with intact comprehension.  Impaired recall, age advanced pyschomotor slowing Cranial Nerves: Visual acuity is grossly normal. Visual fields are full. Extra-ocular movements intact. No ptosis. Face is symmetric Motor: Tone and bulk are normal. Power is full in both arms and legs. Reflexes are symmetric, no pathologic reflexes present.  Sensory: Stocking sensory changes Gait: Sensory dystaxia   Labs: I have reviewed the data as listed    Component Value Date/Time   NA 129 (L) 10/27/2022 1034   NA 135 10/12/2022 1006   K 4.1 10/27/2022 1034   CL 94 (L) 10/27/2022 1034   CO2 27 10/27/2022 1034   GLUCOSE 98 10/27/2022 1034   BUN 13 10/27/2022 1034   BUN 13 10/12/2022 1006   CREATININE 0.99 10/27/2022 1034   CALCIUM 8.5 (L) 10/27/2022 1034   PROT 6.2 (L) 10/27/2022 1034   PROT 6.0 10/12/2022 1006   ALBUMIN 3.8 10/27/2022 1034   ALBUMIN 4.2 10/12/2022 1006   AST 15 10/27/2022 1034   ALT 16 10/27/2022 1034   ALKPHOS 30 (L) 10/27/2022 1034   BILITOT 1.5 (H) 10/27/2022 1034   GFRNONAA >60  10/27/2022 1034   GFRAA 66 09/17/2019 1049   Lab Results  Component Value  Date   WBC 8.8 10/27/2022   NEUTROABS 5.0 10/27/2022   HGB 14.5 10/27/2022   HCT 43.9 10/27/2022   MCV 112.0 (H) 10/27/2022   PLT 139 (L) 10/27/2022     Assessment/Plan Ataxia - Plan: MR BRAIN W WO CONTRAST  Memory impairment - Plan: MR BRAIN W WO CONTRAST  Polyneuropathy associated with underlying disease (HCC)  Edward Mejia presents with clinical syndrome c/w sensory dystaxia.  Localization is uncertain; there is clearly underlying neuropathy based on exam findings, known Vit B12 deficiency.  This has been treated with injections with Dr. Cathie Hoops.    With a symmetric polyneuropathy, we would not expect localized gait dysfunction as is described here.  He also presents with progressive cognitive deficits.    Given concern for an additional CNS process, we did recommend obtaining MRI brain for evaluation.  He is agreeable with this, oral sedation was provided, ativan 1mg  30-78min prior to study.  Will follow up with him for formal neurocognitive screening after the MRI is completed.  We spent twenty additional minutes teaching regarding the natural history, biology, and historical experience in the treatment of neurologic conditions.  We appreciate the opportunity to participate in the care of Edward Mejia.   All questions were answered. The patient knows to call the clinic with any problems, questions or concerns. No barriers to learning were detected.  The total time spent in the encounter was 40 minutes and more than 50% was on counseling and review of test results   Henreitta Leber, MD Medical Director of Neuro-Oncology [facility] at [facility] 12/23/22 11:08 AM

## 2023-01-02 ENCOUNTER — Inpatient Hospital Stay: Payer: Medicare Other

## 2023-01-02 DIAGNOSIS — G629 Polyneuropathy, unspecified: Secondary | ICD-10-CM | POA: Diagnosis not present

## 2023-01-02 DIAGNOSIS — D751 Secondary polycythemia: Secondary | ICD-10-CM

## 2023-01-02 DIAGNOSIS — R4189 Other symptoms and signs involving cognitive functions and awareness: Secondary | ICD-10-CM | POA: Diagnosis not present

## 2023-01-02 DIAGNOSIS — R29818 Other symptoms and signs involving the nervous system: Secondary | ICD-10-CM | POA: Diagnosis not present

## 2023-01-02 DIAGNOSIS — R270 Ataxia, unspecified: Secondary | ICD-10-CM | POA: Diagnosis not present

## 2023-01-02 DIAGNOSIS — E538 Deficiency of other specified B group vitamins: Secondary | ICD-10-CM | POA: Diagnosis not present

## 2023-01-02 MED ORDER — CYANOCOBALAMIN 1000 MCG/ML IJ SOLN: 1000.0000 ug | Freq: Once | INTRAMUSCULAR | Status: DC

## 2023-01-02 MED ORDER — CYANOCOBALAMIN 1000 MCG/ML IJ SOLN
1000.0000 ug | Freq: Once | INTRAMUSCULAR | Status: AC
  Administered 2023-01-02: 1000 ug via INTRAMUSCULAR
  Filled 2023-01-02: qty 1

## 2023-01-03 ENCOUNTER — Ambulatory Visit
Admission: RE | Admit: 2023-01-03 | Discharge: 2023-01-03 | Disposition: A | Discharge status: Home / Self Care | Payer: Medicare Other | Source: Ambulatory Visit | Attending: Internal Medicine | Admitting: Internal Medicine

## 2023-01-03 DIAGNOSIS — R9089 Other abnormal findings on diagnostic imaging of central nervous system: Secondary | ICD-10-CM | POA: Diagnosis not present

## 2023-01-03 DIAGNOSIS — Z789 Other specified health status: Secondary | ICD-10-CM | POA: Diagnosis not present

## 2023-01-03 DIAGNOSIS — R413 Other amnesia: Secondary | ICD-10-CM | POA: Diagnosis not present

## 2023-01-03 DIAGNOSIS — I25118 Atherosclerotic heart disease of native coronary artery with other forms of angina pectoris: Secondary | ICD-10-CM | POA: Diagnosis not present

## 2023-01-03 DIAGNOSIS — R270 Ataxia, unspecified: Secondary | ICD-10-CM | POA: Insufficient documentation

## 2023-01-03 DIAGNOSIS — Z955 Presence of coronary angioplasty implant and graft: Secondary | ICD-10-CM | POA: Diagnosis not present

## 2023-01-03 DIAGNOSIS — I10 Essential (primary) hypertension: Secondary | ICD-10-CM | POA: Diagnosis not present

## 2023-01-03 DIAGNOSIS — I6782 Cerebral ischemia: Secondary | ICD-10-CM | POA: Diagnosis not present

## 2023-01-03 MED ORDER — GADOBUTROL 1 MMOL/ML IV SOLN
9.0000 mL | Freq: Once | INTRAVENOUS | Status: AC | PRN
  Administered 2023-01-03: 9 mL via INTRAVENOUS

## 2023-01-13 ENCOUNTER — Inpatient Hospital Stay: Payer: Medicare Other | Attending: Oncology | Admitting: Internal Medicine

## 2023-01-13 ENCOUNTER — Encounter: Payer: Self-pay | Admitting: Internal Medicine

## 2023-01-13 VITALS — BP 159/71 | HR 62 | Temp 97.8°F | Wt 211.2 lb

## 2023-01-13 DIAGNOSIS — Z86711 Personal history of pulmonary embolism: Secondary | ICD-10-CM | POA: Diagnosis not present

## 2023-01-13 DIAGNOSIS — E871 Hypo-osmolality and hyponatremia: Secondary | ICD-10-CM | POA: Diagnosis not present

## 2023-01-13 DIAGNOSIS — E538 Deficiency of other specified B group vitamins: Secondary | ICD-10-CM | POA: Insufficient documentation

## 2023-01-13 DIAGNOSIS — D72820 Lymphocytosis (symptomatic): Secondary | ICD-10-CM | POA: Diagnosis not present

## 2023-01-13 DIAGNOSIS — G63 Polyneuropathy in diseases classified elsewhere: Secondary | ICD-10-CM

## 2023-01-13 DIAGNOSIS — Z86718 Personal history of other venous thrombosis and embolism: Secondary | ICD-10-CM | POA: Diagnosis not present

## 2023-01-13 DIAGNOSIS — G629 Polyneuropathy, unspecified: Secondary | ICD-10-CM | POA: Diagnosis not present

## 2023-01-13 DIAGNOSIS — I25110 Atherosclerotic heart disease of native coronary artery with unstable angina pectoris: Secondary | ICD-10-CM | POA: Diagnosis not present

## 2023-01-13 DIAGNOSIS — Z7982 Long term (current) use of aspirin: Secondary | ICD-10-CM | POA: Insufficient documentation

## 2023-01-13 DIAGNOSIS — D45 Polycythemia vera: Secondary | ICD-10-CM | POA: Insufficient documentation

## 2023-01-13 DIAGNOSIS — Z7902 Long term (current) use of antithrombotics/antiplatelets: Secondary | ICD-10-CM | POA: Diagnosis not present

## 2023-01-13 NOTE — Progress Notes (Signed)
[facility] [address]  [place], [place] [address] [phone]   Interval Evaluation  Date of Service: 01/13/23 Patient Name: Edward Mejia Patient MRN: [id number] Patient DOB: [birth year] Provider: Henreitta Leber, MD  Identifying Statement:  Edward Mejia is a 77 y.o. male with Polyneuropathy associated with underlying disease [facility])   Primary Cancer: n/a  Interval History Edward Mejia presents today for follow up after recent MRI study.  He describes no change or modest improvement with imbalance since prior visit.  Numbness in bottom of feet remains the same.  Memory loss has not progressed further, he remains independent with ADLs.  H+P (12/23/22) Patient presents to review recent neurologic symptoms.  He describes issues with his balance; he has been "veering off to the left and bumping into objects on his left side".  Has been having to hold onto objects, but not using a cane or walker.  Fortunately no falls.  Acknowledges numbness in bottoms of both feet which has been present for more than a year.  Short term memory has declined drastically, he often forgets conversations or where he places items around the home.  He is no longer managing bills and would not feel comfortable living independently right now because of these issues.  Also describes periods of light-headedness upon standing, no LOC or syncope.  Medications: Current Outpatient Medications on File Prior to Visit  Medication Sig Dispense Refill   acetaminophen (TYLENOL) 500 MG tablet Take 1,000 mg by mouth 3 (three) times daily as needed for mild pain or headache.      albuterol (VENTOLIN HFA) 108 (90 Base) MCG/ACT inhaler Inhale 2 puffs into the lungs every 6 (six) hours as needed for wheezing or shortness of breath. (Patient not taking: Reported on 12/23/2022)     apixaban (ELIQUIS) 2.5 MG TABS tablet Take 1 tablet (2.5 mg total) by mouth 2 (two) times daily. 180  tablet 3   augmented betamethasone dipropionate (DIPROLENE-AF) 0.05 % cream Apply topically 2 (two) times daily as needed.     etanercept (ENBREL) 50 MG/ML injection Inject 50 mg into the skin every Thursday.      gabapentin (NEURONTIN) 300 MG capsule Take 900 mg by mouth See admin instructions. At 4 pm     HYDROcodone-acetaminophen (NORCO) 10-325 MG tablet Take 1 tablet by mouth every 6 (six) hours as needed.     hydrocortisone 2.5 % cream APPLY TOPICALLY TO THE AFFECTED AREA TWICE DAILY AS NEEDED 30 g 3   hydroxyurea (HYDREA) 500 MG capsule Take 1 capsule (500 mg total) by mouth See admin instructions. May take with food to minimize GI side effects. Take 500mg  on Sundays, and 1000 mg daily for the rest of the week. 180 capsule 1   isosorbide mononitrate (IMDUR) 120 MG 24 hr tablet Take 120 mg by mouth daily.      LORazepam (ATIVAN) 1 MG tablet Take 1 tablet (1 mg total) by mouth once as needed for up to 1 dose (mri sedation). 3 tablet 0   magic mouthwash (nystatin, lidocaine, diphenhydrAMINE, alum & mag hydroxide) suspension Swish and spit 5 mLs 4 (four) times daily. 30 mL viscous lidocaine 2% 30 mL Mylanta. 30 mL diphenhydramine (Benadryl) at 12.5 mg per 5 ml elixir. 30 mL nystatin (100,000U per 5 mL) suspension. 30 mL prednisolone at 15mg  per 5ml solution. 30 mL distilled water. (Patient not taking: Reported on 12/23/2022) 180 mL 0   nitroGLYCERIN (NITROSTAT) 0.4 MG SL  tablet Place 0.4 mg under the tongue every 5 (five) minutes as needed for chest pain. (Patient not taking: Reported on 12/23/2022)     predniSONE (DELTASONE) 5 MG tablet Take 7.5 mg by mouth every morning.     ranolazine (RANEXA) 1000 MG SR tablet Take 1,000 mg by mouth 2 (two) times daily.     valsartan (DIOVAN) 160 MG tablet TAKE 1 TABLET(160 MG) BY MOUTH DAILY 90 tablet 4   No current facility-administered medications on file prior to visit.    Allergies:  Allergies  Allergen Reactions   Pravachol [Pravastatin] Other  (See Comments)    Caused FATIGUE   Past Medical History:  Past Medical History:  Diagnosis Date   Arthritis    Bilateral pulmonary embolism (HCC) 03/2019   in setting of Covid-19 pneumonia   Cancer (HCC)    skin cancer   Cellulitis    Cellulitis    DVT (deep venous thrombosis) (HCC) 03/2019   in setting of Covid-19 pneumonia   Erythrocytosis 09/20/2019   H/O adenomatous polyp of colon 03/24/2015   HCAP (healthcare-associated pneumonia) 04/12/2019   History of brain disorder: history of amaurosis fugax 03/24/2015   History of pneumonia 04/04/2008   History of rheumatic fever 03/24/2015   DID Have Rheumatic Fever.     Hypertension    Left leg DVT (HCC) 04/12/2019   In setting of Covid-19 started anticoagulation 04-12-2019. Subsequently Dx PCV and long term NOAC per hematology    Peptic ulcer disease    Sepsis (HCC) 04/12/2019   secondary to Covid pneumonia   Past Surgical History:  Past Surgical History:  Procedure Laterality Date   APPENDECTOMY     LEFT HEART CATH AND CORONARY ANGIOGRAPHY Left 02/06/2018   Procedure: LEFT HEART CATH AND CORONARY ANGIOGRAPHY;  Surgeon: Dalia Heading, MD;  Location: [facility] INVASIVE CV LAB;  Service: Cardiovascular;  Laterality: Left;   MANDIBLE FRACTURE SURGERY     fractured jaw   PARTIAL GASTRECTOMY     peptic ulcer disease   Social History:  Social History   Socioeconomic History   Marital status: Married    Spouse name: Edward Mejia    Number of children: 4   Years of education: Not on file   Highest education level: 8th grade  Occupational History   Occupation: Disabled    Comment: due to Psoriatic Arthritis  Tobacco Use   Smoking status: Former    Current packs/day: 0.00    Types: Cigarettes    Quit date: 09/20/1982    Years since quitting: 40.3   Smokeless tobacco: Never   Tobacco comments:    quit over 40+ years ago  Vaping Use   Vaping status: Never Used  Substance and Sexual Activity   Alcohol use: No    Alcohol/week: 0.0 standard  drinks of alcohol   Drug use: No   Sexual activity: Not on file  Other Topics Concern   Not on file  Social History Narrative   Not on file   Social Determinants of Health   Financial Resource Strain: Low Risk  (09/20/2022)   Received from [facility]   Overall Financial Resource Strain (CARDIA)    Difficulty of Paying Living Expenses: Not hard at all  Food Insecurity: No Food Insecurity (09/20/2022)   Received from [facility]   Hunger Vital Sign    Worried About Running Out of Food in the Last Year: Never true    Ran Out of Food in the Last Year: Never true  Transportation  Needs: No Transportation Needs (09/20/2022)   Received from [facility] - Transportation    Lack of Transportation (Medical): No    Lack of Transportation (Non-Medical): No  Physical Activity: Unknown (01/08/2019)   Exercise Vital Sign    Days of Exercise per Week: Not on file    Minutes of Exercise per Session: 0 min  Stress: No Stress Concern Present (01/08/2019)   [employer] of Occupational Health - Occupational Stress Questionnaire    Feeling of Stress : Not at all  Social Connections: Unknown (06/19/2021)   Received from [facility]   Social Network    Social Network: Not on file  Intimate Partner Violence: Unknown (05/11/2021)   Received from [facility]   HITS    Physically Hurt: Not on file    Insult or Talk Down To: Not on file    Threaten Physical Harm: Not on file    Scream or Curse: Not on file   Family History:  Family History  Problem Relation Age of Onset   Cancer Mother        lung cancer   Heart disease Father 60       heart attack   Cancer Brother        pancreatic cancer   Cancer Brother        pancreatic cancer    Review of Systems: Constitutional: Doesn't report fevers, chills or abnormal weight loss Eyes: Doesn't report blurriness of vision Ears, nose, mouth, throat, and face: Doesn't report sore throat Respiratory:  Doesn't report cough, dyspnea or wheezes Cardiovascular: Doesn't report palpitation, chest discomfort  Gastrointestinal:  Doesn't report nausea, constipation, diarrhea GU: Doesn't report incontinence Skin: Doesn't report skin rashes Neurological: Per HPI Musculoskeletal: Doesn't report joint pain Behavioral/Psych: Doesn't report anxiety  Physical Exam: Vitals:   01/13/23 1100  BP: (!) 159/71  Pulse: 62  Temp: 97.8 F (36.6 C)    KPS: 70. General: Alert, cooperative, pleasant, in no acute distress Head: Normal EENT: No conjunctival injection or scleral icterus.  Lungs: Resp effort normal Cardiac: Regular rate Abdomen: Non-distended abdomen Skin: No rashes cyanosis or petechiae. Extremities: No clubbing or edema  Neurologic Exam: Mental Status: Awake, alert, attentive to examiner. Oriented to self and environment. Language is fluent with intact comprehension.  Impaired recall, age advanced pyschomotor slowing Cranial Nerves: Visual acuity is grossly normal. Visual fields are full. Extra-ocular movements intact. No ptosis. Face is symmetric Motor: Tone and bulk are normal. Power is full in both arms and legs. Reflexes are symmetric, no pathologic reflexes present.  Sensory: Stocking sensory changes Gait: Sensory dystaxia   Labs: I have reviewed the data as listed    Component Value Date/Time   NA 129 (L) 10/27/2022 1034   NA 135 10/12/2022 1006   K 4.1 10/27/2022 1034   CL 94 (L) 10/27/2022 1034   CO2 27 10/27/2022 1034   GLUCOSE 98 10/27/2022 1034   BUN 13 10/27/2022 1034   BUN 13 10/12/2022 1006   CREATININE 0.99 10/27/2022 1034   CALCIUM 8.5 (L) 10/27/2022 1034   PROT 6.2 (L) 10/27/2022 1034   PROT 6.0 10/12/2022 1006   ALBUMIN 3.8 10/27/2022 1034   ALBUMIN 4.2 10/12/2022 1006   AST 15 10/27/2022 1034   ALT 16 10/27/2022 1034   ALKPHOS 30 (L) 10/27/2022 1034   BILITOT 1.5 (H) 10/27/2022 1034   GFRNONAA >60 10/27/2022 1034   GFRAA 66 09/17/2019 1049   Lab  Results  Component  Value Date   WBC 8.8 10/27/2022   NEUTROABS 5.0 10/27/2022   HGB 14.5 10/27/2022   HCT 43.9 10/27/2022   MCV 112.0 (H) 10/27/2022   PLT 139 (L) 10/27/2022    Imaging:  MR BRAIN W WO CONTRAST  Result Date: 01/03/2023 CLINICAL DATA:  Brain/CNS neoplasm, assess treatment response EXAM: MRI HEAD WITHOUT AND WITH CONTRAST TECHNIQUE: Multiplanar, multiecho pulse sequences of the brain and surrounding structures were obtained without and with intravenous contrast. CONTRAST:  9mL GADAVIST GADOBUTROL 1 MMOL/ML IV SOLN COMPARISON:  None Available. FINDINGS: Brain: Negative for an acute infarct. No hemorrhage. No hydrocephalus. No extra-axial fluid collection. No mass effect. No mass lesion. There is a background of mild chronic microvascular ischemic change. No contrast-enhancing lesions visualized. Generalized volume loss without lobar predominance. Vascular: Normal flow voids. Skull and upper cervical spine: Normal marrow signal. Sinuses/Orbits: Middle ear mastoid effusion. Paranasal sinuses are clear. Bilateral lens replacement. Orbits are otherwise unremarkable. Other: None. IMPRESSION: No acute intracranial process. No specific etiology for dizziness or memory loss identified. Electronically Signed   By: Lorenza [place] M.D.   On: 01/03/2023 13:41     Assessment/Plan Polyneuropathy associated with underlying disease (HCC)  Balinda Quails presents with clinical syndrome c/w sensory dystaxia.  Etiology is likely multifactorial; advanced lumbar neuroforaminal stenosis and length dependent neuropathy 2/2 B12 deficiency (now corrected).  MRI brain demonstrated normal age related findings.  He will continue to follow with neurosurgery for lumbar issue, continues to defer surgery for now.  We appreciate the opportunity to participate in the care of Deandrae Tartaglia.  He may follow up as needed.  All questions were answered. The patient knows to call the clinic with any problems,  questions or concerns. No barriers to learning were detected.  The total time spent in the encounter was 30 minutes and more than 50% was on counseling and review of test results   Henreitta Leber, MD Medical Director of Neuro-Oncology [facility] at [facility] 01/13/23 10:56 AM

## 2023-01-18 DIAGNOSIS — Z7901 Long term (current) use of anticoagulants: Secondary | ICD-10-CM | POA: Diagnosis not present

## 2023-01-18 DIAGNOSIS — I10 Essential (primary) hypertension: Secondary | ICD-10-CM | POA: Diagnosis not present

## 2023-01-18 DIAGNOSIS — I25110 Atherosclerotic heart disease of native coronary artery with unstable angina pectoris: Secondary | ICD-10-CM | POA: Diagnosis not present

## 2023-01-18 DIAGNOSIS — Z955 Presence of coronary angioplasty implant and graft: Secondary | ICD-10-CM | POA: Diagnosis not present

## 2023-01-20 DIAGNOSIS — I25118 Atherosclerotic heart disease of native coronary artery with other forms of angina pectoris: Secondary | ICD-10-CM | POA: Diagnosis not present

## 2023-01-20 DIAGNOSIS — R079 Chest pain, unspecified: Secondary | ICD-10-CM | POA: Diagnosis not present

## 2023-01-24 ENCOUNTER — Encounter
Admission: RE | Disposition: A | Discharge status: Home / Self Care | Payer: Self-pay | Source: Home / Self Care | Attending: Cardiology

## 2023-01-24 ENCOUNTER — Encounter: Payer: Self-pay | Admitting: Cardiology

## 2023-01-24 ENCOUNTER — Observation Stay
Admission: RE | Admit: 2023-01-24 | Discharge: 2023-01-25 | Disposition: A | Discharge status: Home / Self Care | Payer: Medicare Other | Attending: Cardiology | Admitting: Cardiology

## 2023-01-24 ENCOUNTER — Other Ambulatory Visit: Payer: Self-pay

## 2023-01-24 DIAGNOSIS — Z87891 Personal history of nicotine dependence: Secondary | ICD-10-CM | POA: Insufficient documentation

## 2023-01-24 DIAGNOSIS — I2510 Atherosclerotic heart disease of native coronary artery without angina pectoris: Secondary | ICD-10-CM | POA: Diagnosis not present

## 2023-01-24 DIAGNOSIS — Z79899 Other long term (current) drug therapy: Secondary | ICD-10-CM | POA: Diagnosis not present

## 2023-01-24 DIAGNOSIS — I25119 Atherosclerotic heart disease of native coronary artery with unspecified angina pectoris: Secondary | ICD-10-CM | POA: Diagnosis not present

## 2023-01-24 DIAGNOSIS — Z7982 Long term (current) use of aspirin: Secondary | ICD-10-CM | POA: Diagnosis not present

## 2023-01-24 DIAGNOSIS — Z7901 Long term (current) use of anticoagulants: Secondary | ICD-10-CM | POA: Insufficient documentation

## 2023-01-24 DIAGNOSIS — I10 Essential (primary) hypertension: Secondary | ICD-10-CM | POA: Diagnosis not present

## 2023-01-24 DIAGNOSIS — R079 Chest pain, unspecified: Secondary | ICD-10-CM

## 2023-01-24 HISTORY — PX: CORONARY STENT INTERVENTION: CATH118234

## 2023-01-24 HISTORY — PX: LEFT HEART CATH AND CORONARY ANGIOGRAPHY: CATH118249

## 2023-01-24 LAB — POCT ACTIVATED CLOTTING TIME
Activated Clotting Time: 250 s
Activated Clotting Time: 360 s
Activated Clotting Time: 297 s

## 2023-01-24 SURGERY — LEFT HEART CATH AND CORONARY ANGIOGRAPHY
Anesthesia: Moderate Sedation

## 2023-01-24 MED ORDER — FENTANYL CITRATE (PF) 100 MCG/2ML IJ SOLN
INTRAMUSCULAR | Status: AC
  Filled 2023-01-24: qty 2

## 2023-01-24 MED ORDER — MIDAZOLAM HCL 2 MG/2ML IJ SOLN
INTRAMUSCULAR | Status: DC | PRN
  Administered 2023-01-24 (×2): 1 mg via INTRAVENOUS

## 2023-01-24 MED ORDER — SODIUM CHLORIDE 0.9 % IV SOLN: 250.0000 mL | INTRAVENOUS | Status: DC | PRN

## 2023-01-24 MED ORDER — ONDANSETRON HCL 4 MG/2ML IJ SOLN
4.0000 mg | Freq: Four times a day (QID) | INTRAMUSCULAR | Status: DC | PRN
Start: 2023-01-24 — End: 2023-01-25

## 2023-01-24 MED ORDER — HYDROCODONE-ACETAMINOPHEN 5-325 MG PO TABS
ORAL_TABLET | ORAL | Status: AC
  Filled 2023-01-24: qty 1

## 2023-01-24 MED ORDER — GABAPENTIN 300 MG PO CAPS
300.0000 mg | ORAL_CAPSULE | Freq: Three times a day (TID) | ORAL | Status: DC
  Administered 2023-01-24 – 2023-01-25 (×2): 300 mg via ORAL
  Filled 2023-01-24 (×2): qty 1

## 2023-01-24 MED ORDER — HEPARIN (PORCINE) IN NACL 1000-0.9 UT/500ML-% IV SOLN
INTRAVENOUS | Status: AC
  Filled 2023-01-24: qty 1000

## 2023-01-24 MED ORDER — APIXABAN 2.5 MG PO TABS
2.5000 mg | ORAL_TABLET | Freq: Two times a day (BID) | ORAL | Status: DC
  Administered 2023-01-25: 2.5 mg via ORAL
  Filled 2023-01-24: qty 1

## 2023-01-24 MED ORDER — HYDROCODONE-ACETAMINOPHEN 10-325 MG PO TABS
1.0000 | ORAL_TABLET | Freq: Four times a day (QID) | ORAL | Status: DC | PRN
  Administered 2023-01-24: 1 via ORAL
  Filled 2023-01-24: qty 1

## 2023-01-24 MED ORDER — LIDOCAINE HCL (PF) 1 % IJ SOLN
INTRAMUSCULAR | Status: DC | PRN
  Administered 2023-01-24: 2 mL

## 2023-01-24 MED ORDER — ACETAMINOPHEN 325 MG PO TABS: 650.0000 mg | ORAL_TABLET | ORAL | Status: DC | PRN

## 2023-01-24 MED ORDER — ISOSORBIDE MONONITRATE ER 60 MG PO TB24
120.0000 mg | ORAL_TABLET | Freq: Every day | ORAL | Status: DC
  Administered 2023-01-25: 120 mg via ORAL
  Filled 2023-01-24: qty 2

## 2023-01-24 MED ORDER — HYDROCODONE-ACETAMINOPHEN 5-325 MG PO TABS
ORAL_TABLET | ORAL | Status: AC
  Administered 2023-01-24: 1
  Filled 2023-01-24: qty 2

## 2023-01-24 MED ORDER — HYDROXYUREA 500 MG PO CAPS: 500.0000 mg | ORAL_CAPSULE | ORAL | Status: DC

## 2023-01-24 MED ORDER — HYDROXYUREA 500 MG PO CAPS
500.0000 mg | ORAL_CAPSULE | ORAL | Status: DC
Start: 2023-01-29 — End: 2023-01-25

## 2023-01-24 MED ORDER — CLOPIDOGREL BISULFATE 75 MG PO TABS
75.0000 mg | ORAL_TABLET | Freq: Every day | ORAL | Status: DC
  Administered 2023-01-25: 75 mg via ORAL
  Filled 2023-01-24: qty 1

## 2023-01-24 MED ORDER — SODIUM CHLORIDE 0.9% FLUSH: 3.0000 mL | INTRAVENOUS | Status: DC | PRN

## 2023-01-24 MED ORDER — FENTANYL CITRATE (PF) 100 MCG/2ML IJ SOLN
INTRAMUSCULAR | Status: DC | PRN
  Administered 2023-01-24: 25 ug via INTRAVENOUS
  Administered 2023-01-24: 50 ug via INTRAVENOUS
  Administered 2023-01-24: 25 ug via INTRAVENOUS

## 2023-01-24 MED ORDER — RANOLAZINE ER 500 MG PO TB12
1000.0000 mg | ORAL_TABLET | Freq: Two times a day (BID) | ORAL | Status: DC
Start: 2023-01-24 — End: 2023-01-25
  Administered 2023-01-24 – 2023-01-25 (×2): 1000 mg via ORAL
  Filled 2023-01-24 (×3): qty 2

## 2023-01-24 MED ORDER — HYDRALAZINE HCL 20 MG/ML IJ SOLN: 10.0000 mg | INTRAMUSCULAR | Status: AC | PRN

## 2023-01-24 MED ORDER — HEPARIN SODIUM (PORCINE) 1000 UNIT/ML IJ SOLN
INTRAMUSCULAR | Status: AC
  Filled 2023-01-24: qty 10

## 2023-01-24 MED ORDER — ASPIRIN 81 MG PO CHEW
CHEWABLE_TABLET | ORAL | Status: DC | PRN
  Administered 2023-01-24: 243 mg via ORAL

## 2023-01-24 MED ORDER — SODIUM CHLORIDE 0.9% FLUSH: 3.0000 mL | Freq: Two times a day (BID) | INTRAVENOUS | Status: DC

## 2023-01-24 MED ORDER — SODIUM CHLORIDE 0.9 % WEIGHT BASED INFUSION: 1.0000 mL/kg/h | INTRAVENOUS | Status: DC

## 2023-01-24 MED ORDER — SODIUM CHLORIDE 0.9 % WEIGHT BASED INFUSION
3.0000 mL/kg/h | INTRAVENOUS | Status: AC
  Administered 2023-01-24: 3 mL/kg/h via INTRAVENOUS

## 2023-01-24 MED ORDER — LIDOCAINE HCL 1 % IJ SOLN
INTRAMUSCULAR | Status: AC
  Filled 2023-01-24: qty 20

## 2023-01-24 MED ORDER — ASPIRIN 81 MG PO CHEW
81.0000 mg | CHEWABLE_TABLET | ORAL | Status: AC
  Administered 2023-01-24: 81 mg via ORAL

## 2023-01-24 MED ORDER — PREDNISONE 5 MG PO TABS
7.5000 mg | ORAL_TABLET | Freq: Every day | ORAL | Status: DC
  Administered 2023-01-25: 7.5 mg via ORAL
  Filled 2023-01-24: qty 1

## 2023-01-24 MED ORDER — IRBESARTAN 150 MG PO TABS
300.0000 mg | ORAL_TABLET | Freq: Every day | ORAL | Status: DC
  Administered 2023-01-25: 300 mg via ORAL
  Filled 2023-01-24 (×2): qty 2

## 2023-01-24 MED ORDER — ASPIRIN 81 MG PO CHEW
CHEWABLE_TABLET | ORAL | Status: AC
  Filled 2023-01-24: qty 1

## 2023-01-24 MED ORDER — NITROGLYCERIN 0.4 MG SL SUBL: 0.4000 mg | SUBLINGUAL_TABLET | SUBLINGUAL | Status: DC | PRN

## 2023-01-24 MED ORDER — LABETALOL HCL 5 MG/ML IV SOLN
10.0000 mg | INTRAVENOUS | Status: AC | PRN
Start: 2023-01-24 — End: 2023-01-24

## 2023-01-24 MED ORDER — HEPARIN SODIUM (PORCINE) 1000 UNIT/ML IJ SOLN
INTRAMUSCULAR | Status: DC | PRN
  Administered 2023-01-24: 3000 [IU] via INTRAVENOUS
  Administered 2023-01-24: 7000 [IU] via INTRAVENOUS
  Administered 2023-01-24: 1500 [IU] via INTRAVENOUS
  Administered 2023-01-24: 4500 [IU] via INTRAVENOUS

## 2023-01-24 MED ORDER — CLOPIDOGREL BISULFATE 75 MG PO TABS
ORAL_TABLET | ORAL | Status: DC | PRN
  Administered 2023-01-24: 600 mg via ORAL

## 2023-01-24 MED ORDER — HEPARIN (PORCINE) IN NACL 1000-0.9 UT/500ML-% IV SOLN
INTRAVENOUS | Status: DC | PRN
  Administered 2023-01-24 (×2): 500 mL

## 2023-01-24 MED ORDER — MIDAZOLAM HCL 2 MG/2ML IJ SOLN
INTRAMUSCULAR | Status: AC
  Filled 2023-01-24: qty 2

## 2023-01-24 MED ORDER — HYDROXYUREA 500 MG PO CAPS
500.0000 mg | ORAL_CAPSULE | ORAL | Status: DC
Start: 2023-01-24 — End: 2023-01-25
  Administered 2023-01-24 – 2023-01-25 (×2): 500 mg via ORAL
  Filled 2023-01-24 (×2): qty 1

## 2023-01-24 MED ORDER — ASPIRIN 81 MG PO CHEW
81.0000 mg | CHEWABLE_TABLET | Freq: Every day | ORAL | Status: DC
  Administered 2023-01-25: 81 mg via ORAL
  Filled 2023-01-24: qty 1

## 2023-01-24 MED ORDER — IOHEXOL 300 MG/ML  SOLN
INTRAMUSCULAR | Status: DC | PRN
  Administered 2023-01-24: 197 mL

## 2023-01-24 MED ORDER — SODIUM CHLORIDE 0.9 % WEIGHT BASED INFUSION
1.0000 mL/kg/h | INTRAVENOUS | Status: AC
Start: 2023-01-24 — End: 2023-01-25
  Administered 2023-01-24: 1 mL/kg/h via INTRAVENOUS

## 2023-01-24 MED ORDER — SODIUM CHLORIDE 0.9% FLUSH
3.0000 mL | Freq: Two times a day (BID) | INTRAVENOUS | Status: DC
  Administered 2023-01-25: 3 mL via INTRAVENOUS

## 2023-01-24 MED ORDER — VERAPAMIL HCL 2.5 MG/ML IV SOLN
INTRAVENOUS | Status: AC
  Filled 2023-01-24: qty 2

## 2023-01-24 MED ORDER — VERAPAMIL HCL 2.5 MG/ML IV SOLN
INTRAVENOUS | Status: DC | PRN
  Administered 2023-01-24: 2.5 mg via INTRA_ARTERIAL

## 2023-01-24 MED ORDER — CLOPIDOGREL BISULFATE 75 MG PO TABS
ORAL_TABLET | ORAL | Status: AC
  Filled 2023-01-24: qty 8

## 2023-01-24 MED ORDER — ASPIRIN 81 MG PO CHEW
CHEWABLE_TABLET | ORAL | Status: AC
  Filled 2023-01-24: qty 3

## 2023-01-24 SURGICAL SUPPLY — 20 items
BALLN TREK RX 3.0X12 (BALLOONS) ×2
BALLN ~~LOC~~ TREK NEO RX 4.0X12 (BALLOONS) ×2
BALLOON TREK RX 3.0X12 (BALLOONS) IMPLANT
BALLOON ~~LOC~~ TREK NEO RX 4.0X12 (BALLOONS) IMPLANT
CATH 5FR JL3.5 JR4 ANG PIG MP (CATHETERS) IMPLANT
CATH VISTA GUIDE 6FR JR4 (CATHETERS) IMPLANT
DEVICE RAD TR BAND REGULAR (VASCULAR PRODUCTS) IMPLANT
DRAPE BRACHIAL (DRAPES) IMPLANT
GLIDESHEATH SLEND SS 6F .021 (SHEATH) IMPLANT
GUIDEWIRE INQWIRE 1.5J.035X260 (WIRE) IMPLANT
INQWIRE 1.5J .035X260CM (WIRE) ×2
KIT ENCORE 26 ADVANTAGE (KITS) IMPLANT
PACK CARDIAC CATH (CUSTOM PROCEDURE TRAY) ×2 IMPLANT
PROTECTION STATION PRESSURIZED (MISCELLANEOUS) ×2
SET ATX-X65L (MISCELLANEOUS) IMPLANT
STATION PROTECTION PRESSURIZED (MISCELLANEOUS) IMPLANT
STENT ONYX FRONTIER 3.5X15 (Permanent Stent) IMPLANT
TUBING CIL FLEX 10 FLL-RA (TUBING) IMPLANT
WIRE ASAHI PROWATER 180CM (WIRE) IMPLANT
WIRE G HI TQ BMW 190 (WIRE) IMPLANT

## 2023-01-25 ENCOUNTER — Encounter: Payer: Self-pay | Admitting: Cardiology

## 2023-01-25 ENCOUNTER — Other Ambulatory Visit: Payer: Self-pay

## 2023-01-25 DIAGNOSIS — Z87891 Personal history of nicotine dependence: Secondary | ICD-10-CM | POA: Diagnosis not present

## 2023-01-25 DIAGNOSIS — Z7982 Long term (current) use of aspirin: Secondary | ICD-10-CM | POA: Diagnosis not present

## 2023-01-25 DIAGNOSIS — Z7901 Long term (current) use of anticoagulants: Secondary | ICD-10-CM | POA: Diagnosis not present

## 2023-01-25 DIAGNOSIS — Z79899 Other long term (current) drug therapy: Secondary | ICD-10-CM | POA: Diagnosis not present

## 2023-01-25 DIAGNOSIS — Z9861 Coronary angioplasty status: Secondary | ICD-10-CM | POA: Diagnosis not present

## 2023-01-25 DIAGNOSIS — I25119 Atherosclerotic heart disease of native coronary artery with unspecified angina pectoris: Secondary | ICD-10-CM | POA: Diagnosis not present

## 2023-01-25 DIAGNOSIS — D45 Polycythemia vera: Secondary | ICD-10-CM

## 2023-01-25 DIAGNOSIS — I10 Essential (primary) hypertension: Secondary | ICD-10-CM | POA: Diagnosis not present

## 2023-01-25 LAB — BASIC METABOLIC PANEL
Anion gap: 6 (ref 5–15)
BUN: 12 mg/dL (ref 8–23)
CO2: 25 mmol/L (ref 22–32)
Calcium: 8.2 mg/dL — ABNORMAL LOW (ref 8.9–10.3)
Chloride: 101 mmol/L (ref 98–111)
Creatinine, Ser: 1 mg/dL (ref 0.61–1.24)
GFR, Estimated: >60 mL/min (ref >60)
Glucose, Bld: 86 mg/dL (ref 70–99)
Potassium: 4.3 mmol/L (ref 3.5–5.1)
Sodium: 132 mmol/L — ABNORMAL LOW (ref 135–145)

## 2023-01-25 LAB — CBC
HCT: 42.1 % (ref 39.0–52.0)
Hemoglobin: 14.1 g/dL (ref 13.0–17.0)
MCH: 37.4 pg — ABNORMAL HIGH (ref 26.0–34.0)
MCHC: 33.5 g/dL (ref 30.0–36.0)
MCV: 111.7 fL — ABNORMAL HIGH (ref 80.0–100.0)
Platelets: 110 10*3/uL — ABNORMAL LOW (ref 150–400)
RBC: 3.77 MIL/uL — ABNORMAL LOW (ref 4.22–5.81)
RDW: 14.2 % (ref 11.5–15.5)
WBC: 6.9 10*3/uL (ref 4.0–10.5)
nRBC: 0 % (ref 0.0–0.2)

## 2023-01-25 MED ORDER — ORAL CARE MOUTH RINSE: 15.0000 mL | OROMUCOSAL | Status: DC | PRN

## 2023-01-25 MED ORDER — CLOPIDOGREL BISULFATE 75 MG PO TABS: 75.0000 mg | ORAL_TABLET | Freq: Every day | ORAL | 0 refills | Status: AC

## 2023-01-25 MED ORDER — ASPIRIN 81 MG PO TBEC: 81.0000 mg | DELAYED_RELEASE_TABLET | Freq: Every day | ORAL | 0 refills | Status: DC

## 2023-01-25 NOTE — Plan of Care (Signed)
  Problem: Education: Goal: Understanding of CV disease, CV risk reduction, and recovery process will improve Outcome: Progressing   Problem: Activity: Goal: Ability to return to baseline activity level will improve Outcome: Progressing   Problem: Cardiovascular: Goal: Ability to achieve and maintain adequate cardiovascular perfusion will improve Outcome: Progressing Goal: Vascular access site(s) Level 0-1 will be maintained Outcome: Progressing   Problem: Health Behavior/Discharge Planning: Goal: Ability to safely manage health-related needs after discharge will improve Outcome: Progressing   Problem: Education: Goal: Knowledge of General Education information will improve Description: Including pain rating scale, medication(s)/side effects and non-pharmacologic comfort measures Outcome: Progressing   Problem: Health Behavior/Discharge Planning: Goal: Ability to manage health-related needs will improve Outcome: Progressing   Problem: Clinical Measurements: Goal: Ability to maintain clinical measurements within normal limits will improve Outcome: Progressing Goal: Will remain free from infection Outcome: Progressing Goal: Respiratory complications will improve Outcome: Progressing Goal: Cardiovascular complication will be avoided Outcome: Progressing   Problem: Activity: Goal: Risk for activity intolerance will decrease Outcome: Progressing

## 2023-01-25 NOTE — Discharge Summary (Signed)
Discharge Summary      Patient ID: Edward Mejia MRN: [id number] DOB/AGE: 78-Dec-1947 78 y.o.  Admit date: 01/24/2023 Discharge date: 01/25/2023  Primary Discharge Diagnosis coronary artery disease with angina Secondary Discharge Diagnosis s/p coronary stent placement  Significant Diagnostic Studies: left heart catheterization   Consults: none  Hospital Course: The patient was brought to the cardiac cath lab and underwent left heart catheterization and coronary angiography with Dr. Darrold Junker on 01/24/2023. The patient tolerated with procedure well without complications.  The patient received ONYX FRONTIER 3.5X15 stent to the proximal RCA . On 01/25/2023 the R wrist access site was examined and found to be without significant erythema, tenderness to palpation, or apparent aneurysm. Hospital course was overall uneventful, on day of discharge the patient was ambulatory and eager to go home.  Discussed cardiac rehab and new prescription medications in detail. The patient was given aftercare instructions and ER return precautions. Will arrange for follow up in office in 1 week, or sooner if needed.      Discharge Exam: Blood pressure (!) 145/66, pulse (!) 57, temperature 97.7 F (36.5 C), temperature source Oral, resp. rate 18, height 6' (1.829 m), weight 95.9 kg, SpO2 100%.    General: Well appearing elderly male, well nourished, in no acute distress sitting upright in hospital bed with wife present at bedside.  HEENT:  Normocephalic and atraumatic. Neck:   No JVD.  Lungs: Normal respiratory effort on room air.  Clear to ascultation bilaterally. Heart: HRRR . Normal S1 and S2 without gallops or murmurs.  Abdomen: non-distended appearing.  Msk: Normal strength and tone for age. Extremities: No peripheral edema. R radial access site without bleeding, significant tenderness to palpation, apparent aneurysm, or significant ecchymosis. Covered with clean gauze and tegaderm dressing.   Neuro: Alert and oriented x3 Psych:  calm and cooperative.   Labs:   Lab Results  Component Value Date   WBC 6.9 01/25/2023   HGB 14.1 01/25/2023   HCT 42.1 01/25/2023   MCV 111.7 (H) 01/25/2023   PLT 110 (L) 01/25/2023    Recent Labs  Lab 01/25/23 0557  NA 132*  K 4.3  CL 101  CO2 25  BUN 12  CREATININE 1.00  CALCIUM 8.2*  GLUCOSE 86      Radiology: None EKG: sinus bradycardia rate 57 bpm  FOLLOW UP PLANS AND APPOINTMENTS Discharge Instructions     AMB Referral to Cardiac Rehabilitation - Phase II   Complete by: As directed    Diagnosis: Coronary Stents   After initial evaluation and assessments completed: Virtual Based Care may be provided alone or in conjunction with Phase 2 Cardiac Rehab based on patient barriers.: Yes   Intensive Cardiac Rehabilitation (ICR) [facility] location only OR Traditional Cardiac Rehabilitation (TCR) *If criteria for ICR are not met will enroll in TCR [facility] only): Yes      Allergies as of 01/25/2023       Reactions   Pravachol [pravastatin] Other (See Comments)   Caused FATIGUE        Medication List     TAKE these medications    apixaban 2.5 MG Tabs tablet Commonly known as: Eliquis Take 1 tablet (2.5 mg total) by mouth 2 (two) times daily.   aspirin EC 81 MG tablet Take 1 tablet (81 mg total) by mouth daily. Swallow whole.   clopidogrel 75 MG tablet Commonly known as: PLAVIX Take 1 tablet (75 mg total) by mouth daily with breakfast.   etanercept 50 MG/ML injection  Commonly known as: ENBREL Inject 50 mg into the skin every Thursday.   gabapentin 300 MG capsule Commonly known as: NEURONTIN Take 900 mg by mouth See admin instructions. At 4 pm   HYDROcodone-acetaminophen 10-325 MG tablet Commonly known as: NORCO Take 1 tablet by mouth every 6 (six) hours as needed for moderate pain (pain score 4-6).   hydrocortisone 2.5 % cream APPLY TOPICALLY TO THE AFFECTED AREA TWICE DAILY AS NEEDED   hydroxyurea 500 MG  capsule Commonly known as: HYDREA Take 1 capsule (500 mg total) by mouth See admin instructions. May take with food to minimize GI side effects. Take 500mg  on Sundays, and 1000 mg daily for the rest of the week. What changed: additional instructions   isosorbide mononitrate 120 MG 24 hr tablet Commonly known as: IMDUR Take 120 mg by mouth daily.   nitroGLYCERIN 0.4 MG SL tablet Commonly known as: NITROSTAT Place 0.4 mg under the tongue every 5 (five) minutes as needed for chest pain.   predniSONE 5 MG tablet Commonly known as: DELTASONE Take 7.5 mg by mouth every morning.   ranolazine 1000 MG SR tablet Commonly known as: RANEXA Take 1,000 mg by mouth 2 (two) times daily.   valsartan 160 MG tablet Commonly known as: DIOVAN TAKE 1 TABLET(160 MG) BY MOUTH DAILY What changed: See the new instructions.        Follow-up Information     Germaine Pomfret, Eliane Decree, NP. Go in 1 week(s).   Specialty: Nurse Practitioner Contact information: [address] [place] [address] [phone]                 PLEASE BRING ALL MEDICATIONS WITH YOU TO FOLLOW UP APPOINTMENTS  Time spent with patient: 41  Signed: Gale Journey PA-C 01/25/2023, 8:52 AM [facility] Cardiology

## 2023-01-26 ENCOUNTER — Inpatient Hospital Stay: Payer: Medicare Other

## 2023-01-26 ENCOUNTER — Inpatient Hospital Stay: Payer: Medicare Other | Admitting: Oncology

## 2023-01-26 ENCOUNTER — Encounter: Payer: Self-pay | Admitting: Oncology

## 2023-01-26 VITALS — BP 129/76 | HR 70 | Temp 96.7°F | Resp 18 | Wt 213.4 lb

## 2023-01-26 DIAGNOSIS — D45 Polycythemia vera: Secondary | ICD-10-CM

## 2023-01-26 DIAGNOSIS — D72820 Lymphocytosis (symptomatic): Secondary | ICD-10-CM

## 2023-01-26 DIAGNOSIS — Z86711 Personal history of pulmonary embolism: Secondary | ICD-10-CM

## 2023-01-26 DIAGNOSIS — E538 Deficiency of other specified B group vitamins: Secondary | ICD-10-CM | POA: Diagnosis not present

## 2023-01-26 DIAGNOSIS — D751 Secondary polycythemia: Secondary | ICD-10-CM

## 2023-01-26 DIAGNOSIS — E871 Hypo-osmolality and hyponatremia: Secondary | ICD-10-CM

## 2023-01-26 LAB — CMP (CANCER CENTER ONLY)
ALT: 11 U/L (ref 0–44)
AST: 16 U/L (ref 15–41)
Albumin: 3.6 g/dL (ref 3.5–5.0)
Alkaline Phosphatase: 34 U/L — ABNORMAL LOW (ref 38–126)
Anion gap: 10 (ref 5–15)
BUN: 14 mg/dL (ref 8–23)
CO2: 25 mmol/L (ref 22–32)
Calcium: 8.6 mg/dL — ABNORMAL LOW (ref 8.9–10.3)
Chloride: 94 mmol/L — ABNORMAL LOW (ref 98–111)
Creatinine: 1.27 mg/dL — ABNORMAL HIGH (ref 0.61–1.24)
GFR, Estimated: 58 mL/min — ABNORMAL LOW (ref >60)
Glucose, Bld: 96 mg/dL (ref 70–99)
Potassium: 4.7 mmol/L (ref 3.5–5.1)
Sodium: 129 mmol/L — ABNORMAL LOW (ref 135–145)
Total Bilirubin: 1.4 mg/dL — ABNORMAL HIGH (ref <1.2)
Total Protein: 6 g/dL — ABNORMAL LOW (ref 6.5–8.1)

## 2023-01-26 LAB — LIPOPROTEIN A (LPA): Lipoprotein (a): 21.8 nmol/L (ref <75.0)

## 2023-01-26 LAB — CBC WITH DIFFERENTIAL (CANCER CENTER ONLY)
Abs Immature Granulocytes: 0.2 10*3/uL — ABNORMAL HIGH (ref 0.00–0.07)
Basophils Absolute: 0.1 10*3/uL (ref 0.0–0.1)
Basophils Relative: 1 %
Eosinophils Absolute: 0 10*3/uL (ref 0.0–0.5)
Eosinophils Relative: 0 %
HCT: 43.5 % (ref 39.0–52.0)
Hemoglobin: 14.6 g/dL (ref 13.0–17.0)
Immature Granulocytes: 2 %
Lymphocytes Relative: 34 %
Lymphs Abs: 3.1 10*3/uL (ref 0.7–4.0)
MCH: 37.6 pg — ABNORMAL HIGH (ref 26.0–34.0)
MCHC: 33.6 g/dL (ref 30.0–36.0)
MCV: 112.1 fL — ABNORMAL HIGH (ref 80.0–100.0)
Monocytes Absolute: 0.8 10*3/uL (ref 0.1–1.0)
Monocytes Relative: 9 %
Neutro Abs: 4.7 10*3/uL (ref 1.7–7.7)
Neutrophils Relative %: 54 %
Platelet Count: 128 10*3/uL — ABNORMAL LOW (ref 150–400)
RBC: 3.88 MIL/uL — ABNORMAL LOW (ref 4.22–5.81)
RDW: 14.1 % (ref 11.5–15.5)
WBC Count: 8.9 10*3/uL (ref 4.0–10.5)
nRBC: 0 % (ref 0.0–0.2)

## 2023-01-26 LAB — VITAMIN B12: Vitamin B-12: 470 pg/mL (ref 180–914)

## 2023-01-26 MED ORDER — CYANOCOBALAMIN 1000 MCG/ML IJ SOLN
1000.0000 ug | Freq: Once | INTRAMUSCULAR | Status: AC
  Administered 2023-01-26: 1000 ug via INTRAMUSCULAR
  Filled 2023-01-26: qty 1

## 2023-01-26 NOTE — Progress Notes (Signed)
Hematology/Oncology Progress note [phone] [phone]     CHIEF COMPLAINTS/REASON FOR VISIT:  Follow-up for polycythemia vera-Jak [id number] mutation  ASSESSMENT & PLAN:   Polycythemia vera (HCC) JAK2 V617F mutation. Labs reviewed and discussed with patient. Stable counts Continue hydroxyurea 500mg  on Sundays, and 1000 mg daily for the rest of the week.  History of pulmonary embolism continue Elqiuis 2.5mg  BID. -Patient also takes aspirin 81 mg and plavix 75 mg for CAD.  There is plan to discontinue aspirin in 2 weeks.  Monoclonal B-cell lymphocytosis of undetermined significance flowcytometry showed stable monoclonal lymphocytosis clone, <5000 Stable white count.  Continue observation   B12 deficiency B12 has improved.  He will receive an additional dose of B12 injection today.  Hyponatremia Slightly decreased.   Orders Placed This Encounter  Procedures   CBC with Differential (Cancer Center Only)    Standing Status:   Future    Expected Date:   04/26/2023    Expiration Date:   01/26/2024   CMP (Cancer Center only)    Standing Status:   Future    Expected Date:   04/26/2023    Expiration Date:   01/26/2024   Vitamin B12    Standing Status:   Future    Expected Date:   04/26/2023    Expiration Date:   01/26/2024   Follow up 3 months All questions were answered. The patient knows to call the clinic with any problems, questions or concerns.  [name], MD, PhD [facility] Hematology Oncology 01/26/2023      HISTORY OF PRESENTING ILLNESS:   Edward Mejia is a  77 y.o.  male with PMH including rheumatoid arthritis, peripheral vascular disease hypertension, Covid infection history,, was seen in consultation at the request of  Fisher, Demetrios Isaacs, MD  for evaluation of polycytosis Patient was accompanied by her wife who participated in providing medical history. February 2021, patient was hospitalized due to COVID-19 pneumonia, respiratory  failure, treated with steroids, remdesivir, received bamlanivimab.  Hospitalization was also completed by [facility], bleeding was  thought to be secondary to hemorrhoids.  He was discharged on 04/07/2019 and presented back to emergency room on 04/12/2019 due to left lower extremity swelling, acute hypoxic respiratory failure.  Work-up showed acute bilateral pulmonary embolism, large left lower extremity DVT, thrombosis and IVC and left external iliac vein.  Patient was started on anticoagulation and transition to Eliquis Patient has been on Eliquis since March 2021.  Patient also takes aspirin.  Patient has history of CAD, follows up with Dr. Lady Gary.  Rheumatoid arthritis on Enbrel once a week.  Also on prednisone 7.5 mg daily. Patient has also been found to have erythrocytosis. 09/20/2019, CBC showed hemoglobin of 17.6, reviewed previous medical records, erythrocytosis can be traced back to at least 2017.  Patient also has leukocytosis since March 2021.  Leukocytosis has trended down recently and CBC on 09/20/2019 showed a white count of 10.8, predominantly monocytosis.  Patient is a former smoker, 10-12-pack-year smoking history and he stopped smoking in 1984. Currently he has some chronic shortness of breath with exertion.  He has lost weight during his previous hospitalization and has gained weight since discharge.  Sometimes he feels sweaty.  Patient also reports intermittent dizziness/lightheaded episodes for the past couple of months.  He actually had an episode during today's visit and is spontaneously resolved after few minutes.  Denies any shortness of breath, chest pain, vision changes, focal deficits.  Patient has been referred to establish care with neurology  for further evaluation.  #  psoriatic arthritis follows up with [facility] Dr. Cardell Peach.on chronic prednisone.  # skin squamous cell carcinoma removal from his scalp. #09/27/2021 He recently underwent cardiac cath for abnormal stress test and  unstable angina s/p drug eluting coronary stent placement.   INTERVAL HISTORY Edward Mejia is a 77 y.o. male who has above history reviewed by me today presents for follow up visit for management of polycythemia vera Patient has been on Eliquis 2.5 mg twice daily.  Takes hydroxyurea 1000mg  daily except 500 mg on Sundays. Patient has received parenteral vitamin B12 injections.  B12 has improved however balancing problem remains similar. Recently status post catheterization.  He was started on Plavix 75 mg daily.  Also on aspirin 81 mg daily.   .Review of Systems  Constitutional:  Positive for fatigue. Negative for appetite change, chills, fever and unexpected weight change.  HENT:   Negative for hearing loss and voice change.   Eyes:  Negative for eye problems and icterus.  Respiratory:  Negative for chest tightness, cough and shortness of breath.   Cardiovascular:  Negative for chest pain and leg swelling.  Gastrointestinal:  Negative for abdominal distention, abdominal pain and blood in stool.  Endocrine: Negative for hot flashes.  Genitourinary:  Negative for difficulty urinating, dysuria and frequency.   Musculoskeletal:  Negative for arthralgias.  Skin:  Negative for itching and rash.  Neurological:  Negative for extremity weakness, light-headedness and numbness.  Hematological:  Negative for adenopathy. Bruises/bleeds easily.  Psychiatric/Behavioral:  Negative for confusion.     MEDICAL HISTORY:  Past Medical History:  Diagnosis Date   Arthritis    Bilateral pulmonary embolism (HCC) 03/2019   in setting of Covid-19 pneumonia   Cancer (HCC)    skin cancer   Cellulitis    Cellulitis    DVT (deep venous thrombosis) (HCC) 03/2019   in setting of Covid-19 pneumonia   Erythrocytosis 09/20/2019   H/O adenomatous polyp of colon 03/24/2015   HCAP (healthcare-associated pneumonia) 04/12/2019   History of brain disorder: history of amaurosis fugax 03/24/2015   History of pneumonia  04/04/2008   History of rheumatic fever 03/24/2015   DID Have Rheumatic Fever.     Hypertension    Left leg DVT (HCC) 04/12/2019   In setting of Covid-19 started anticoagulation 04-12-2019. Subsequently Dx PCV and long term NOAC per hematology    Peptic ulcer disease    Sepsis (HCC) 04/12/2019   secondary to Covid pneumonia    SURGICAL HISTORY: Past Surgical History:  Procedure Laterality Date   APPENDECTOMY     CORONARY STENT INTERVENTION N/A 01/24/2023   Procedure: CORONARY STENT INTERVENTION;  Surgeon: Marcina Millard, MD;  Location: [facility] INVASIVE CV LAB;  Service: Cardiovascular;  Laterality: N/A;   LEFT HEART CATH AND CORONARY ANGIOGRAPHY Left 02/06/2018   Procedure: LEFT HEART CATH AND CORONARY ANGIOGRAPHY;  Surgeon: Dalia Heading, MD;  Location: [facility] INVASIVE CV LAB;  Service: Cardiovascular;  Laterality: Left;   LEFT HEART CATH AND CORONARY ANGIOGRAPHY Left 01/24/2023   Procedure: LEFT HEART CATH AND CORONARY ANGIOGRAPHY;  Surgeon: Marcina Millard, MD;  Location: [facility] INVASIVE CV LAB;  Service: Cardiovascular;  Laterality: Left;   MANDIBLE FRACTURE SURGERY     fractured jaw   PARTIAL GASTRECTOMY     peptic ulcer disease    SOCIAL HISTORY: Social History   Socioeconomic History   Marital status: Married    Spouse name: Andrey Campanile    Number of children: 4   Years  of education: Not on file   Highest education level: 8th grade  Occupational History   Occupation: Disabled    Comment: due to Psoriatic Arthritis  Tobacco Use   Smoking status: Former    Current packs/day: 0.00    Types: Cigarettes    Quit date: 09/20/1982    Years since quitting: 40.3   Smokeless tobacco: Never   Tobacco comments:    quit over 40+ years ago  Vaping Use   Vaping status: Never Used  Substance and Sexual Activity   Alcohol use: No    Alcohol/week: 0.0 standard drinks of alcohol   Drug use: No   Sexual activity: Not on file  Other Topics Concern   Not on file  Social History  Narrative   Lives with [place], wife.  No indoor pets.    Social Drivers of Corporate investment banker Strain: Low Risk  (09/20/2022)   Received from [facility]   Overall Financial Resource Strain (CARDIA)    Difficulty of Paying Living Expenses: Not hard at all  Food Insecurity: No Food Insecurity (01/24/2023)   Hunger Vital Sign    Worried About Running Out of Food in the Last Year: Never true    Ran Out of Food in the Last Year: Never true  Transportation Needs: No Transportation Needs (01/24/2023)   PRAPARE - Administrator, Civil Service (Medical): No    Lack of Transportation (Non-Medical): No  Physical Activity: Unknown (01/08/2019)   Exercise Vital Sign    Days of Exercise per Week: Not on file    Minutes of Exercise per Session: 0 min  Stress: No Stress Concern Present (01/08/2019)   [employer] of Occupational Health - Occupational Stress Questionnaire    Feeling of Stress : Not at all  Social Connections: Unknown (06/19/2021)   Received from [facility]   Social Network    Social Network: Not on file  Intimate Partner Violence: Not At Risk (01/24/2023)   Humiliation, Afraid, Rape, and Kick questionnaire    Fear of Current or Ex-Partner: No    Emotionally Abused: No    Physically Abused: No    Sexually Abused: No    FAMILY HISTORY: Family History  Problem Relation Age of Onset   Cancer Mother        lung cancer   Heart disease Father 36       heart attack   Cancer Brother        pancreatic cancer   Cancer Brother        pancreatic cancer    ALLERGIES:  is allergic to pravachol [pravastatin].  MEDICATIONS:  Current Outpatient Medications  Medication Sig Dispense Refill   apixaban (ELIQUIS) 2.5 MG TABS tablet Take 1 tablet (2.5 mg total) by mouth 2 (two) times daily. 180 tablet 3   aspirin EC 81 MG tablet Take 1 tablet (81 mg total) by mouth daily. Swallow whole. 14 tablet 0   clopidogrel (PLAVIX) 75 MG tablet Take 1  tablet (75 mg total) by mouth daily with breakfast. 30 tablet 0   etanercept (ENBREL) 50 MG/ML injection Inject 50 mg into the skin every Thursday.      gabapentin (NEURONTIN) 300 MG capsule Take 900 mg by mouth See admin instructions. At 4 pm     HYDROcodone-acetaminophen (NORCO) 10-325 MG tablet Take 1 tablet by mouth every 6 (six) hours as needed for moderate pain (pain score 4-6).     hydrocortisone 2.5 % cream APPLY  TOPICALLY TO THE AFFECTED AREA TWICE DAILY AS NEEDED 30 g 3   hydroxyurea (HYDREA) 500 MG capsule Take 1 capsule (500 mg total) by mouth See admin instructions. May take with food to minimize GI side effects. Take 500mg  on Sundays, and 1000 mg daily for the rest of the week. (Patient taking differently: Take 500 mg by mouth See admin instructions. May take with food to minimize GI side effects. Take 500mg  on Sundays, and 500 mg twice daily for the rest of the week.) 180 capsule 1   isosorbide mononitrate (IMDUR) 120 MG 24 hr tablet Take 120 mg by mouth daily.      predniSONE (DELTASONE) 5 MG tablet Take 7.5 mg by mouth every morning.     ranolazine (RANEXA) 1000 MG SR tablet Take 1,000 mg by mouth 2 (two) times daily.     valsartan (DIOVAN) 160 MG tablet TAKE 1 TABLET(160 MG) BY MOUTH DAILY (Patient taking differently: Take 160 mg by mouth 2 (two) times daily.) 90 tablet 4   nitroGLYCERIN (NITROSTAT) 0.4 MG SL tablet Place 0.4 mg under the tongue every 5 (five) minutes as needed for chest pain. (Patient not taking: Reported on 01/26/2023)     No current facility-administered medications for this visit.     PHYSICAL EXAMINATION: ECOG PERFORMANCE STATUS: 1 - Symptomatic but completely ambulatory Today's Vitals   01/26/23 1039  BP: 129/76  Pulse: 70  Resp: 18  Temp: (!) 96.7 F (35.9 C)  TempSrc: Tympanic  Weight: 213 lb 6.4 oz (96.8 kg)  PainSc: 0-No pain   Body mass index is 28.94 kg/m.  Marland Kitchen Physical Exam Constitutional:      General: He is not in acute  distress. HENT:     Head: Normocephalic and atraumatic.  Eyes:     General: No scleral icterus. Cardiovascular:     Rate and Rhythm: Normal rate and regular rhythm.  Pulmonary:     Effort: Pulmonary effort is normal. No respiratory distress.  Abdominal:     General: Bowel sounds are normal.     Palpations: Abdomen is soft.  Musculoskeletal:        General: No deformity. Normal range of motion.     Cervical back: Normal range of motion and neck supple.  Skin:    General: Skin is warm and dry.  Neurological:     Mental Status: He is alert and oriented to person, place, and time. Mental status is at baseline.  Psychiatric:        Mood and Affect: Mood normal.     LABORATORY DATA:  I have reviewed the data as listed    Latest Ref Rng & Units 01/26/2023   10:03 AM 01/25/2023    5:57 AM 10/27/2022   10:35 AM  CBC  WBC 4.0 - 10.5 K/uL 8.9  6.9  8.8   Hemoglobin 13.0 - 17.0 g/dL 82.9  56.2  13.0   Hematocrit 39.0 - 52.0 % 43.5  42.1  43.9   Platelets 150 - 400 K/uL 128  110  139       Latest Ref Rng & Units 01/26/2023   10:02 AM 01/25/2023    5:57 AM 10/27/2022   10:34 AM  CMP  Glucose 70 - 99 mg/dL 96  86  98   BUN 8 - 23 mg/dL 14  12  13    Creatinine 0.61 - 1.24 mg/dL 8.65  7.84  6.96   Sodium 135 - 145 mmol/L 129  132  129   Potassium  3.5 - 5.1 mmol/L 4.7  4.3  4.1   Chloride 98 - 111 mmol/L 94  101  94   CO2 22 - 32 mmol/L 25  25  27    Calcium 8.9 - 10.3 mg/dL 8.6  8.2  8.5   Total Protein 6.5 - 8.1 g/dL 6.0   6.2   Total Bilirubin <1.2 mg/dL 1.4   1.5   Alkaline Phos 38 - 126 U/L 34   30   AST 15 - 41 U/L 16   15   ALT 0 - 44 U/L 11   16      Iron/TIBC/Ferritin/ %Sat    Component Value Date/Time   IRON 49 09/20/2019 1201   TIBC 318 09/20/2019 1201   FERRITIN 24 09/20/2019 1201   IRONPCTSAT 15 (L) 09/20/2019 1201      RADIOGRAPHIC STUDIES: I have personally reviewed the radiological images as listed and agreed with the findings in the report. CARDIAC  CATHETERIZATION Result Date: 01/24/2023   Ost RCA to Prox RCA lesion is 75% stenosed.   Dist RCA lesion is 40% stenosed.   RPDA lesion is 30% stenosed.   1st Mrg lesion is 40% stenosed.   1st Diag lesion is 100% stenosed.   Prox LAD to Mid LAD lesion is 30% stenosed.   A drug-eluting stent was successfully placed using a STENT ONYX FRONTIER 3.5X15.   Post intervention, there is a 0% residual stenosis.   The left ventricular systolic function is normal.   LV end diastolic pressure is mildly elevated.   The left ventricular ejection fraction is 55-65% by visual estimate. 1.  Two-vessel CAD with 75% in-stent restenosis proximal RCA, and chronically occluded D1 with ipsi-collaterals 2.  Normal left ventricular function 3.  Successful PCI with DES for in-stent restenosis proximal RCA Recommendations 1.  Dual antiplatelet therapy uninterrupted x 1 year 2.  Continue aggressive risk factor modification   MR BRAIN W WO CONTRAST Result Date: 01/03/2023 CLINICAL DATA:  Brain/CNS neoplasm, assess treatment response EXAM: MRI HEAD WITHOUT AND WITH CONTRAST TECHNIQUE: Multiplanar, multiecho pulse sequences of the brain and surrounding structures were obtained without and with intravenous contrast. CONTRAST:  9mL GADAVIST GADOBUTROL 1 MMOL/ML IV SOLN COMPARISON:  None Available. FINDINGS: Brain: Negative for an acute infarct. No hemorrhage. No hydrocephalus. No extra-axial fluid collection. No mass effect. No mass lesion. There is a background of mild chronic microvascular ischemic change. No contrast-enhancing lesions visualized. Generalized volume loss without lobar predominance. Vascular: Normal flow voids. Skull and upper cervical spine: Normal marrow signal. Sinuses/Orbits: Middle ear mastoid effusion. Paranasal sinuses are clear. Bilateral lens replacement. Orbits are otherwise unremarkable. Other: None. IMPRESSION: No acute intracranial process. No specific etiology for dizziness or memory loss identified.  Electronically Signed   By: Lorenza Cambridge M.D.   On: 01/03/2023 13:41

## 2023-01-26 NOTE — Assessment & Plan Note (Signed)
JAK2 V617F mutation. Labs reviewed and discussed with patient. Stable counts Continue hydroxyurea 500mg  on Sundays, and 1000 mg daily for the rest of the week.

## 2023-01-26 NOTE — Assessment & Plan Note (Signed)
Slightly decreased.

## 2023-01-26 NOTE — Assessment & Plan Note (Signed)
B12 has improved.  He will receive an additional dose of B12 injection today.

## 2023-01-26 NOTE — Assessment & Plan Note (Addendum)
continue Elqiuis 2.5mg  BID. -Patient also takes aspirin 81 mg and plavix 75 mg for CAD.  There is plan to discontinue aspirin in 2 weeks.

## 2023-01-26 NOTE — Assessment & Plan Note (Signed)
flowcytometry showed stable monoclonal lymphocytosis clone, <5000 Stable white count.  Continue observation

## 2023-01-31 ENCOUNTER — Other Ambulatory Visit: Payer: Self-pay | Admitting: Oncology

## 2023-02-02 NOTE — Telephone Encounter (Signed)
CBC with Differential ([facility] Only) Order: [id number]  Status: Final result     Next appt: 04/24/2023 at 11:00 AM in Oncology ([facility])     Dx: Polycythemia vera (HCC)   Test Result Released: Yes (seen)   0 Result Notes          Component Ref Range & Units (hover) 7 d ago (01/26/23) 8 d ago (01/25/23) 3 mo ago (10/27/22) 3 mo ago (10/12/22) 6 mo ago (07/27/22) 8 mo ago (05/24/22) 9 mo ago (04/25/22)  WBC Count 8.9 6.9 8.8 8.5 R 9.3 12.3 High  8.6  RBC 3.88 Low  3.77 Low  3.92 Low  4.23 R 3.72 Low  3.93 Low  3.64 Low   Hemoglobin 14.6 14.1 14.5 15.9 R 14.6 15.4 15.1  HCT 43.5 42.1 43.9 45.5 R 42.3 45.1 43.2  MCV 112.1 High  111.7 High  112.0 High  108 High  R 113.7 High  114.8 High  118.7 High   MCH 37.6 High  37.4 High  37.0 High  37.6 High  R 39.2 High  39.2 High  41.5 High   MCHC 33.6 33.5 33.0 34.9 R 34.5 34.1 35.0  RDW 14.1 14.2 13.8 13.1 R 16.0 High  13.7 13.2  Platelet Count 128 Low  110 Low  139 Low  CM 147 Low  R 177 117 Low  131 Low   nRBC 0.0 0.0 CM 0.0  0.0 0.0 0.0  Neutrophils Relative % 54  57 56 R 54 65 50  Neutro Abs 4.7  5.0 4.9 R 5.0 8.0 High  4.4  Lymphocytes Relative 34  34  37 26 38  Lymphs Abs 3.1  3.0 2.8 R 3.4 3.2 3.2  Monocytes Relative 9  6  7 7 9   Monocytes Absolute 0.8  0.6  0.7 0.9 0.8  Eosinophils Relative 0  1  0 0 1  Eosinophils Absolute 0.0  0.0  0.0 0.0 0.1  Basophils Relative 1  1  1  0 1  Basophils Absolute 0.1  0.1 0.1 R 0.1 0.1 0.1  Immature Granulocytes 2  1 1  R 1 2 1   Abs Immature Granulocytes 0.20 High   0.09 High  CM  0.13 High  CM 0.20 High  CM 0.09 High  CM  Comment: Performed at [facility], [address] [place], [place] [address]  Lymphs    33 R     Monocytes    8 R     Eos    1 R     Basos    1 R     Monocytes Absolute    0.6 R     EOS (ABSOLUTE)    0.1 R     Immature Grans (Abs)    0.1 R     Resulting Agency [facility]        Specimen  Collected: 01/26/23 10:03 Last Resulted: 01/26/23 10:29      Lab Flowsheet      Order Details      View Encounter      Lab and Collection Details      Routing      Result History    View All Conversations on this Encounter    CM=Additional comments  R=Reference range differs from most recent result in table    Result Care Coordination   Patient Communication   Add Comments  Seen Back to Top   Other Results from 01/26/2023  Vitamin B12 Order: [id number]  Status: Final result     Next appt: 04/24/2023 at 11:00 AM in Oncology ([facility])     Dx: B12 deficiency     Test Result Released: Yes (seen)   0 Result Notes       Component Ref Range & Units (hover) 7 d ago (01/26/23) 3 mo ago (10/27/22) 3 yr ago (04/03/19) 3 yr ago (02/22/19)  Vitamin B-12 470 136 Low  CM 198 CM 266 R  Comment: (NOTE) This assay is not validated for testing neonatal or myeloproliferative syndrome specimens for Vitamin B12 levels. Performed at [facility], [address] [place], [place] [address]  Resulting Agency [facility]        Specimen Collected: 01/26/23 10:03 Last Resulted: 01/26/23 16:06      Lab Flowsheet      Order Details      View Encounter      Lab and Collection Details      Routing      Result History    View All Conversations on this Encounter    CM=Additional comments  R=Reference range differs from most recent result in table    Result Care Coordination   Patient Communication   Add Comments   Seen Back to Top      Contains abnormal data CMP (Cancer Center only) Order: [id number]  Status: Final result     Next appt: 04/24/2023 at 11:00 AM in Oncology ([facility])     Dx: Polycythemia vera (HCC)     Test Result Released: Yes (seen)   0 Result Notes          Component Ref Range & Units (hover) 7 d ago (01/26/23) 8 d ago (01/25/23) 3 mo ago (10/27/22) 3 mo ago (10/12/22) 6 mo ago (07/27/22) 9 mo  ago (04/25/22) 1 yr ago (01/25/22)  Sodium 129 Low  132 Low  129 Low  135 R 131 Low  130 Low  129 Low   Potassium 4.7 4.3 4.1 5.0 R 4.6 4.3 3.9  Chloride 94 Low  101 94 Low  97 R 95 Low  95 Low  94 Low   CO2 25 25 27 25  R 28 28 28   Glucose, Bld 96 86 CM 98 CM 86 96 CM 92 CM 78 CM  Comment: Glucose reference range applies only to samples taken after fasting for at least 8 hours.  BUN 14 12 13 13  R 13 14 12   Creatinine 1.27 High  1.00 0.99 1.23 R 1.01 1.01 1.00  Calcium 8.6 Low  8.2 Low  8.5 Low  9.6 R 8.6 Low  8.7 Low  8.3 Low   Total Protein 6.0 Low   6.2 Low  6.0 R 6.1 Low  6.4 Low  6.2 Low   Albumin 3.6  3.8 4.2 R 3.7 3.9 3.8  AST 16  15 15  R 15 14 Low  15  ALT 11  16 13  R 12 11 11   Alkaline Phosphatase 34 Low   30 Low  41 Low  R 29 Low  28 Low  28 Low   Total Bilirubin 1.4 High   1.5 High  R 1.1 R 1.2 R 1.6 High  R 1.4 High  R  GFR, Estimated 58 Low   >60 CM  >60 CM    Comment: (NOTE) Calculated using the CKD-EPI Creatinine Equation (2021)  Anion gap 10 6 CM 8 CM  8 CM 7 CM 7 CM  Comment: Performed at [facility], [address] [place], [place] [address]  Resulting Agency [facility]        Specimen Collected: 01/26/23 10:02 Last Resulted: 01/26/23 10:25

## 2023-02-03 DIAGNOSIS — I25118 Atherosclerotic heart disease of native coronary artery with other forms of angina pectoris: Secondary | ICD-10-CM | POA: Diagnosis not present

## 2023-02-03 DIAGNOSIS — D45 Polycythemia vera: Secondary | ICD-10-CM | POA: Diagnosis not present

## 2023-02-03 DIAGNOSIS — I824Z2 Acute embolism and thrombosis of unspecified deep veins of left distal lower extremity: Secondary | ICD-10-CM | POA: Diagnosis not present

## 2023-02-03 DIAGNOSIS — I10 Essential (primary) hypertension: Secondary | ICD-10-CM | POA: Diagnosis not present

## 2023-02-06 ENCOUNTER — Encounter: Payer: Self-pay | Admitting: Oncology

## 2023-02-11 ENCOUNTER — Emergency Department: Payer: Medicare Other

## 2023-02-11 ENCOUNTER — Other Ambulatory Visit: Payer: Self-pay

## 2023-02-11 ENCOUNTER — Emergency Department
Admission: EM | Admit: 2023-02-11 | Discharge: 2023-02-11 | Disposition: A | Discharge status: Home / Self Care | Payer: Medicare Other | Attending: Emergency Medicine | Admitting: Emergency Medicine

## 2023-02-11 DIAGNOSIS — I2510 Atherosclerotic heart disease of native coronary artery without angina pectoris: Secondary | ICD-10-CM | POA: Diagnosis not present

## 2023-02-11 DIAGNOSIS — I10 Essential (primary) hypertension: Secondary | ICD-10-CM | POA: Diagnosis not present

## 2023-02-11 DIAGNOSIS — L03116 Cellulitis of left lower limb: Secondary | ICD-10-CM | POA: Diagnosis not present

## 2023-02-11 DIAGNOSIS — M7989 Other specified soft tissue disorders: Secondary | ICD-10-CM | POA: Diagnosis present

## 2023-02-11 DIAGNOSIS — Z955 Presence of coronary angioplasty implant and graft: Secondary | ICD-10-CM | POA: Diagnosis not present

## 2023-02-11 DIAGNOSIS — Z7901 Long term (current) use of anticoagulants: Secondary | ICD-10-CM | POA: Diagnosis not present

## 2023-02-11 MED ORDER — CEPHALEXIN 500 MG PO CAPS: 500.0000 mg | ORAL_CAPSULE | Freq: Four times a day (QID) | ORAL | 0 refills | Status: AC

## 2023-02-11 NOTE — ED Triage Notes (Signed)
 Pt to ED via [facility]. Pt reports left leg swelling and redness. Pt reports hx of DVT after COVID. Pt denies pain. Pt on blood thinner. Pt denies CP or SOB.

## 2023-02-11 NOTE — ED Provider Notes (Signed)
 [facility] Provider Note    Event Date/Time   First MD Initiated Contact with Patient 02/11/23 1113     (approximate)   History   Leg Swelling   HPI  Edward Mejia is a 78 year old male with history of HTN, psoriatic arthritis, DVT/PE on Eliquis , CAD with recent stent placement presenting to the emergency department for evaluation of left lower extremity swelling.  Patient reports a longstanding history of mild swelling of his left lower extremity compared to the contralateral side.  However last night he noticed increased swelling with redness and warmth over the left lower extremity.  Was concerned about possible DVT leading him to present to the ER.  No fevers or chills.  No chest pain or shortness of breath.  Has been compliant with his Eliquis .     Physical Exam   Triage Vital Signs: ED Triage Vitals  Encounter Vitals Group     BP 02/11/23 1023 (!) 146/77     Systolic BP Percentile --      Diastolic BP Percentile --      Pulse Rate 02/11/23 1023 63     Resp 02/11/23 1023 20     Temp 02/11/23 1023 (!) 97.5 F (36.4 C)     Temp Source 02/11/23 1023 Oral     SpO2 02/11/23 1023 100 %     Weight --      Height --      Head Circumference --      Peak Flow --      Pain Score 02/11/23 1018 0     Pain Loc --      Pain Education --      Exclude from Growth Chart --     Most recent vital signs: Vitals:   02/11/23 1023  BP: (!) 146/77  Pulse: 63  Resp: 20  Temp: (!) 97.5 F (36.4 C)  SpO2: 100%     General: Awake, interactive  CV:  Regular rate, good peripheral perfusion.  Resp:  Unlabored respirations, lungs are to auscultation Abd:  Nondistended.  Neuro:  Symmetric facial movement, fluid speech MSK:  Asymmetric lower extremity swelling with blanching erythema of the left lower extremity.  2+ DP pulses bilaterally.  Sensation intact throughout the extremity.  No significant warmth at the time of my evaluation.  ED Results /  Procedures / Treatments   Labs (all labs ordered are listed, but only abnormal results are displayed) Labs Reviewed - No data to display   EKG EKG independently reviewed interpreted by myself (ER attending) demonstrates:    RADIOLOGY Imaging independently reviewed and interpreted by myself demonstrates:  Ultrasound without evidence of DVT  PROCEDURES:  Critical Care performed: No  Procedures   MEDICATIONS ORDERED IN ED: Medications - No data to display   IMPRESSION / MDM / ASSESSMENT AND PLAN / ED COURSE  I reviewed the triage vital signs and the nursing notes.  Differential diagnosis includes, but is not limited to, DVT, early cellulitis, psoriasis  Patient's presentation is most consistent with acute presentation with potential threat to life or bodily function.  78 year old male presenting to the emergency department for evaluation of leg swelling.  Vital stable on presentation.  Ultrasound fortunately without evidence of DVT.  Neurovascularly intact on exam.  Not significantly warm here, but reportedly warm to touch last night with blanching erythema and swelling on exam today.  With this, suspect possible early cellulitis.  Will plan to DC with prescription for antibiotic.  Did  also discuss the importance of repeat ultrasound in about a week if patient has persistent symptoms for possible false negative early DVT ultrasound.  Patient and family expressed understanding.  Strict return precautions provided.  Patient discharged stable condition.      FINAL CLINICAL IMPRESSION(S) / ED DIAGNOSES   Final diagnoses:  Left leg swelling  Cellulitis of left lower extremity     Rx / DC Orders   ED Discharge Orders          Ordered    cephALEXin  (KEFLEX ) 500 MG capsule  4 times daily        02/11/23 1225             Note:  This document was prepared using Dragon voice recognition software and may include unintentional dictation errors.   Levander Slate, MD 02/11/23  1225

## 2023-02-11 NOTE — Discharge Instructions (Signed)
 You were seen in the emergency department today for your leg swelling.  Your ultrasound fortunately did not show signs of a blood clot.  I suspect you may have an early skin infection.  I sent a prescription for antibiotics to your pharmacy.  If you are still having symptoms in about a week, please follow-up with your primary care doctor return to the ER for repeat ultrasound.  Please also return for any new or worsening symptoms.

## 2023-03-20 ENCOUNTER — Ambulatory Visit: Payer: Medicare Other | Admitting: Physician Assistant

## 2023-03-28 IMAGING — DX DG CHEST 2V
2 series · 2 of 2 positions shown · non-contrast
Comparison: Radiograph 04/26/2019

CLINICAL DATA: Cough, fever

EXAM:
CHEST - 2 VIEW

[chest pa]
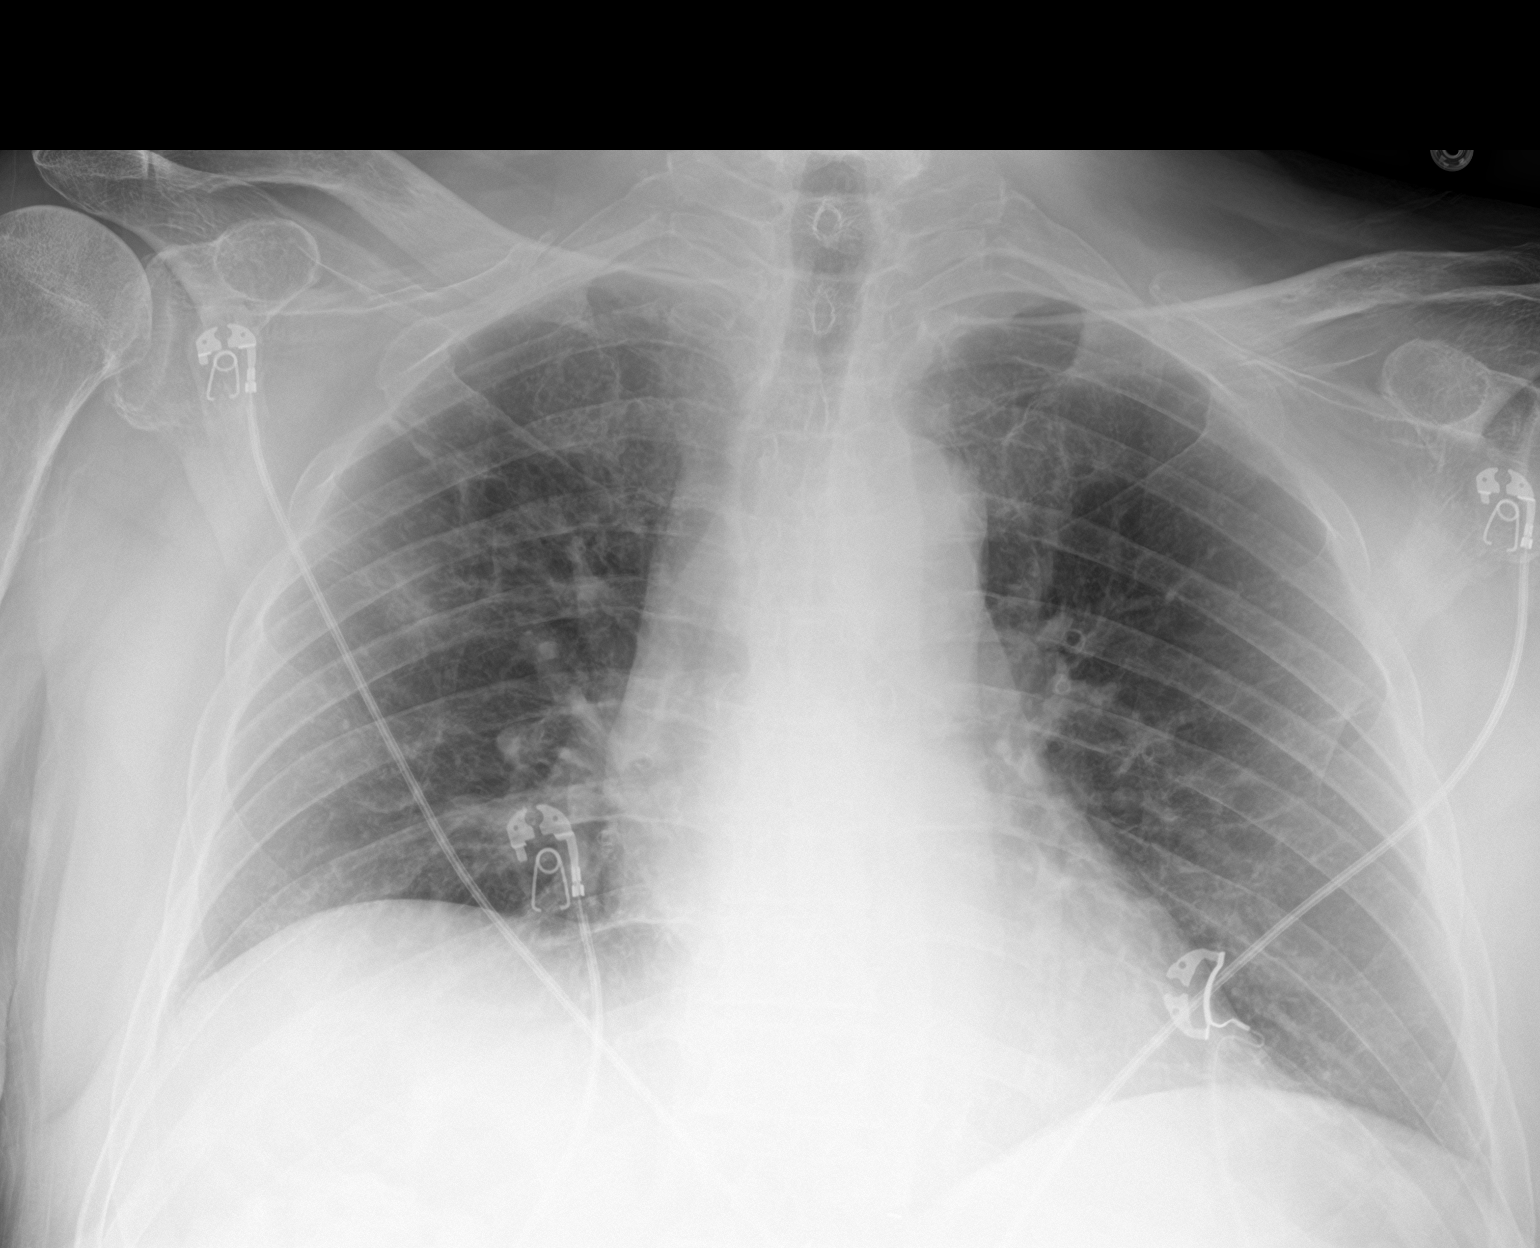

[chest lat]
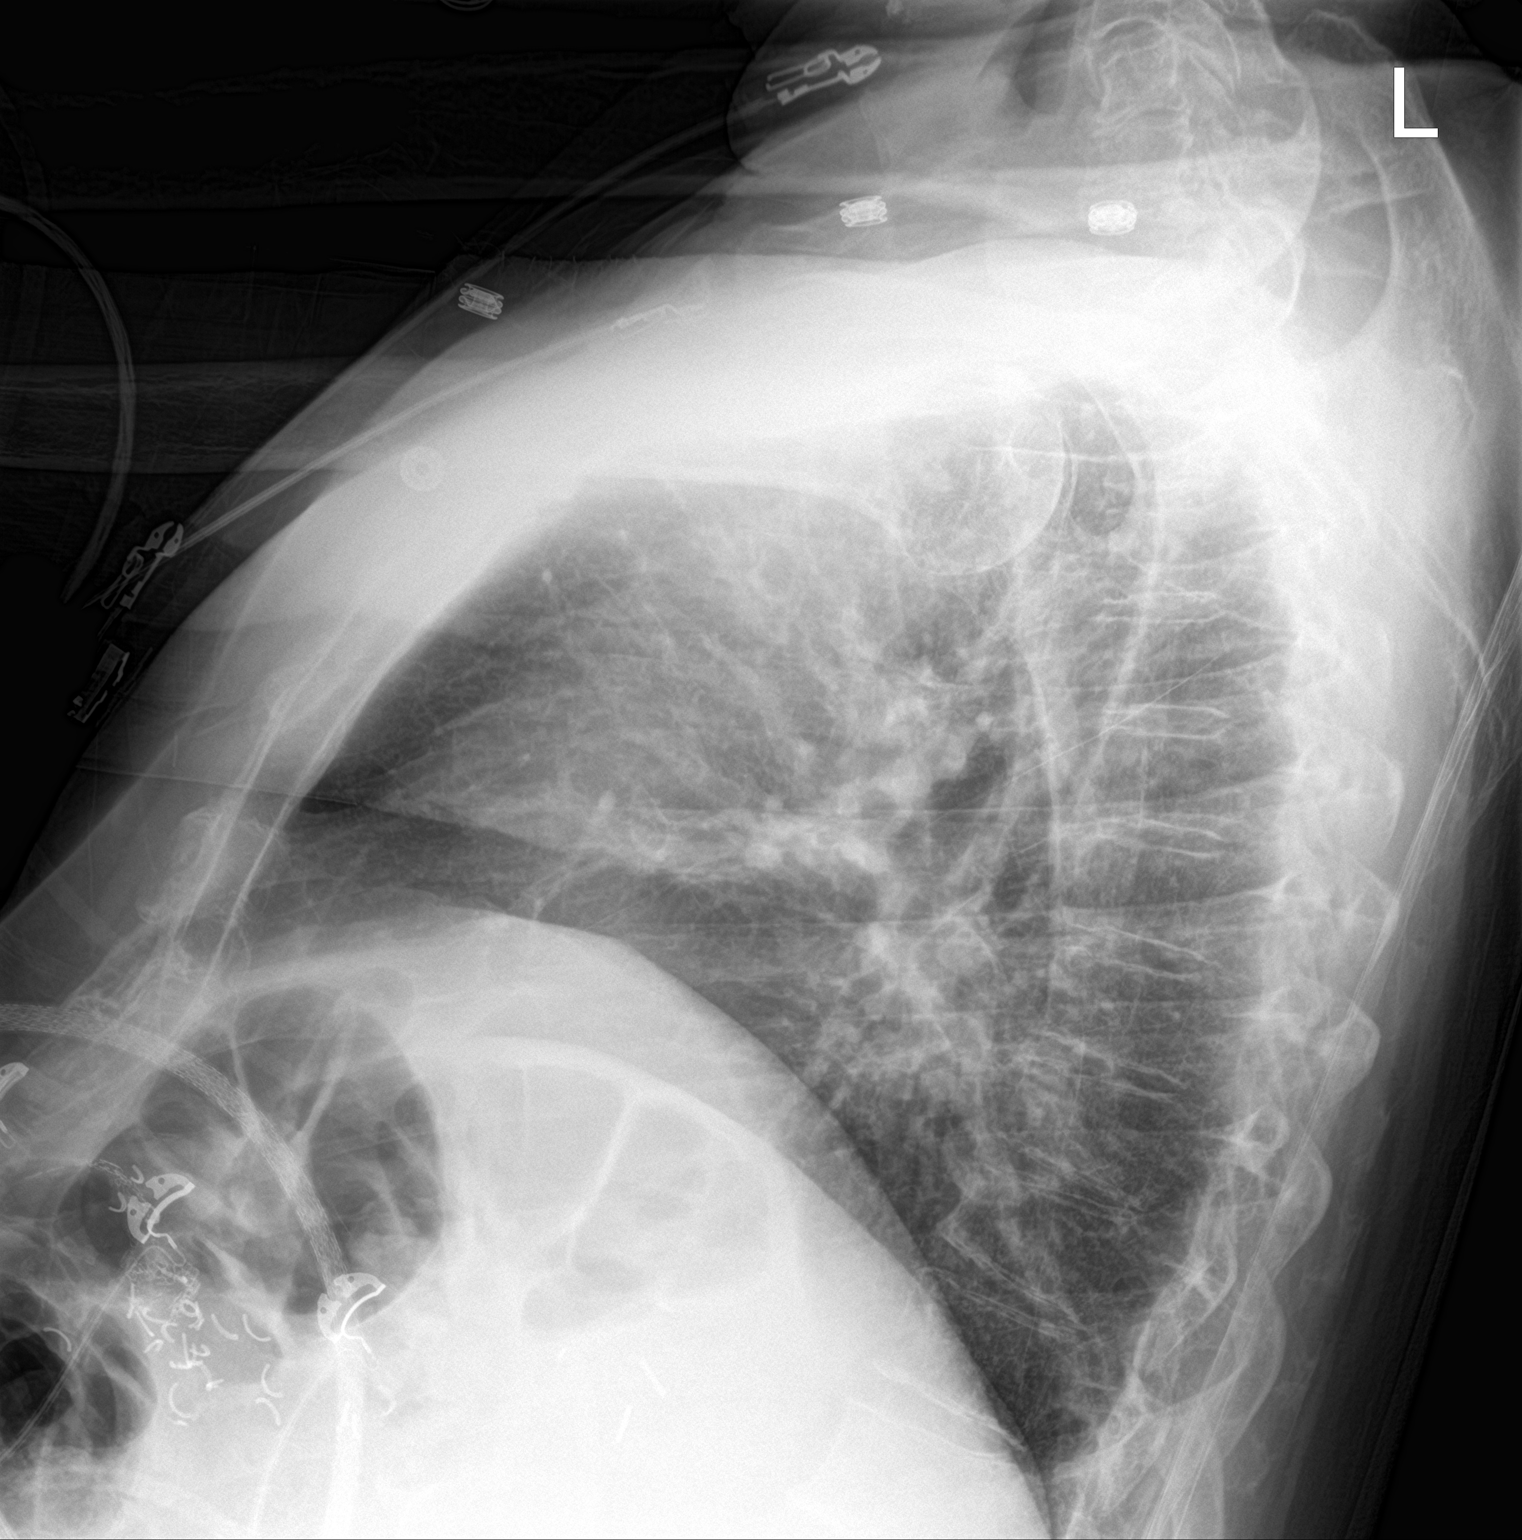

[2 of 2 positions shown; findings below may reference images not displayed]

FINDINGS: Unchanged cardiomediastinal silhouette. Focal airspace opacity in
the right mid lung. Scarring in the right upper lung and left
peripheral mid lung. These opacities are in the region of prior
pneumonia no visible pneumothorax. No acute osseous abnormality.
IMPRESSION: Airspace opacity in the right mid lung, concerning for pneumonia.
This could potentially be chronic postinflammatory change from prior
pneumonia (this is in a similar location to prior infectious process
in 2121). Recommend follow-up PA and lateral chest radiograph in
6-12 weeks.

## 2023-04-18 ENCOUNTER — Other Ambulatory Visit: Payer: Self-pay | Admitting: Internal Medicine

## 2023-04-18 DIAGNOSIS — M8580 Other specified disorders of bone density and structure, unspecified site: Secondary | ICD-10-CM

## 2023-04-24 ENCOUNTER — Inpatient Hospital Stay: Payer: Medicare Other | Attending: Oncology

## 2023-04-24 DIAGNOSIS — L4050 Arthropathic psoriasis, unspecified: Secondary | ICD-10-CM | POA: Diagnosis not present

## 2023-04-24 DIAGNOSIS — E871 Hypo-osmolality and hyponatremia: Secondary | ICD-10-CM | POA: Insufficient documentation

## 2023-04-24 DIAGNOSIS — D45 Polycythemia vera: Secondary | ICD-10-CM | POA: Insufficient documentation

## 2023-04-24 DIAGNOSIS — Z7952 Long term (current) use of systemic steroids: Secondary | ICD-10-CM | POA: Diagnosis not present

## 2023-04-24 DIAGNOSIS — Z87891 Personal history of nicotine dependence: Secondary | ICD-10-CM | POA: Insufficient documentation

## 2023-04-24 DIAGNOSIS — R634 Abnormal weight loss: Secondary | ICD-10-CM | POA: Insufficient documentation

## 2023-04-24 DIAGNOSIS — Z800 Family history of malignant neoplasm of digestive organs: Secondary | ICD-10-CM | POA: Insufficient documentation

## 2023-04-24 DIAGNOSIS — D72820 Lymphocytosis (symptomatic): Secondary | ICD-10-CM | POA: Insufficient documentation

## 2023-04-24 DIAGNOSIS — Z7901 Long term (current) use of anticoagulants: Secondary | ICD-10-CM | POA: Insufficient documentation

## 2023-04-24 DIAGNOSIS — E538 Deficiency of other specified B group vitamins: Secondary | ICD-10-CM | POA: Insufficient documentation

## 2023-04-24 DIAGNOSIS — Z86711 Personal history of pulmonary embolism: Secondary | ICD-10-CM | POA: Diagnosis not present

## 2023-04-24 LAB — CBC WITH DIFFERENTIAL (CANCER CENTER ONLY)
Abs Immature Granulocytes: 0.16 10*3/uL — ABNORMAL HIGH (ref 0.00–0.07)
Basophils Absolute: 0.1 10*3/uL (ref 0.0–0.1)
Basophils Relative: 1 %
Eosinophils Absolute: 0 10*3/uL (ref 0.0–0.5)
Eosinophils Relative: 0 %
HCT: 44 % (ref 39.0–52.0)
Hemoglobin: 14.4 g/dL (ref 13.0–17.0)
Immature Granulocytes: 2 %
Lymphocytes Relative: 36 %
Lymphs Abs: 3.6 10*3/uL (ref 0.7–4.0)
MCH: 36.4 pg — ABNORMAL HIGH (ref 26.0–34.0)
MCHC: 32.7 g/dL (ref 30.0–36.0)
MCV: 111.1 fL — ABNORMAL HIGH (ref 80.0–100.0)
Monocytes Absolute: 1 10*3/uL (ref 0.1–1.0)
Monocytes Relative: 9 %
Neutro Abs: 5.3 10*3/uL (ref 1.7–7.7)
Neutrophils Relative %: 52 %
Platelet Count: 145 10*3/uL — ABNORMAL LOW (ref 150–400)
RBC: 3.96 MIL/uL — ABNORMAL LOW (ref 4.22–5.81)
RDW: 14.1 % (ref 11.5–15.5)
WBC Count: 10.2 10*3/uL (ref 4.0–10.5)
nRBC: 0 % (ref 0.0–0.2)

## 2023-04-24 LAB — VITAMIN B12: Vitamin B-12: 363 pg/mL (ref 180–914)

## 2023-04-24 LAB — CMP (CANCER CENTER ONLY)
ALT: 11 U/L (ref 0–44)
AST: 14 U/L — ABNORMAL LOW (ref 15–41)
Albumin: 3.7 g/dL (ref 3.5–5.0)
Alkaline Phosphatase: 31 U/L — ABNORMAL LOW (ref 38–126)
Anion gap: 5 (ref 5–15)
BUN: 10 mg/dL (ref 8–23)
CO2: 27 mmol/L (ref 22–32)
Calcium: 8.1 mg/dL — ABNORMAL LOW (ref 8.9–10.3)
Chloride: 95 mmol/L — ABNORMAL LOW (ref 98–111)
Creatinine: 1.17 mg/dL (ref 0.61–1.24)
GFR, Estimated: >60 mL/min (ref >60)
Glucose, Bld: 85 mg/dL (ref 70–99)
Potassium: 4.2 mmol/L (ref 3.5–5.1)
Sodium: 127 mmol/L — ABNORMAL LOW (ref 135–145)
Total Bilirubin: 1.3 mg/dL — ABNORMAL HIGH (ref 0.0–1.2)
Total Protein: 6 g/dL — ABNORMAL LOW (ref 6.5–8.1)

## 2023-04-27 ENCOUNTER — Telehealth: Payer: Self-pay

## 2023-04-27 ENCOUNTER — Encounter: Payer: Self-pay | Admitting: Oncology

## 2023-04-27 ENCOUNTER — Inpatient Hospital Stay: Payer: Medicare Other | Admitting: Oncology

## 2023-04-27 ENCOUNTER — Inpatient Hospital Stay

## 2023-04-27 ENCOUNTER — Inpatient Hospital Stay: Payer: Medicare Other

## 2023-04-27 VITALS — BP 161/78 | HR 63 | Temp 97.9°F | Resp 18 | Wt 208.2 lb

## 2023-04-27 DIAGNOSIS — R634 Abnormal weight loss: Secondary | ICD-10-CM

## 2023-04-27 DIAGNOSIS — E871 Hypo-osmolality and hyponatremia: Secondary | ICD-10-CM

## 2023-04-27 DIAGNOSIS — Z86711 Personal history of pulmonary embolism: Secondary | ICD-10-CM | POA: Diagnosis not present

## 2023-04-27 DIAGNOSIS — Z800 Family history of malignant neoplasm of digestive organs: Secondary | ICD-10-CM | POA: Insufficient documentation

## 2023-04-27 DIAGNOSIS — D72820 Lymphocytosis (symptomatic): Secondary | ICD-10-CM

## 2023-04-27 DIAGNOSIS — E538 Deficiency of other specified B group vitamins: Secondary | ICD-10-CM | POA: Diagnosis not present

## 2023-04-27 DIAGNOSIS — D45 Polycythemia vera: Secondary | ICD-10-CM

## 2023-04-27 DIAGNOSIS — D751 Secondary polycythemia: Secondary | ICD-10-CM

## 2023-04-27 LAB — TSH: TSH: 4.609 u[IU]/mL — ABNORMAL HIGH (ref 0.350–4.500)

## 2023-04-27 MED ORDER — CYANOCOBALAMIN 1000 MCG/ML IJ SOLN
1000.0000 ug | Freq: Once | INTRAMUSCULAR | Status: AC
  Administered 2023-04-27: 1000 ug via INTRAMUSCULAR
  Filled 2023-04-27: qty 1

## 2023-04-27 NOTE — Assessment & Plan Note (Signed)
Check CT abdomen pelvis w contrast

## 2023-04-27 NOTE — Assessment & Plan Note (Signed)
 Consider genetic counseling if he agrees.  Pending CT work up

## 2023-04-27 NOTE — Telephone Encounter (Signed)
 Error

## 2023-04-27 NOTE — Assessment & Plan Note (Signed)
JAK2 V617F mutation. Labs reviewed and discussed with patient. Stable counts Continue hydroxyurea 500mg  on Sundays, and 1000 mg daily for the rest of the week.

## 2023-04-27 NOTE — Progress Notes (Signed)
 Pt here for follow up. Pt reports that he is having unintentional weight loss.

## 2023-04-27 NOTE — Assessment & Plan Note (Signed)
 Progressively worse.  He has a history of smoking. I will obtain CT chest w contrast

## 2023-04-27 NOTE — Assessment & Plan Note (Signed)
flowcytometry showed stable monoclonal lymphocytosis clone, <5000 Stable white count.  Continue observation

## 2023-04-27 NOTE — Assessment & Plan Note (Signed)
 B12 has improved.  He will receive  B12 injection today.

## 2023-04-27 NOTE — Progress Notes (Signed)
 Hematology/Oncology Progress note [phone] [phone]     CHIEF COMPLAINTS/REASON FOR VISIT:  Follow-up for polycythemia vera-Jak [id number] mutation  ASSESSMENT & PLAN:   Polycythemia vera (HCC) JAK2 V617F mutation. Labs reviewed and discussed with patient. Stable counts Continue hydroxyurea 500mg  on Sundays, and 1000 mg daily for the rest of the week.  B12 deficiency B12 has improved.  He will receive  B12 injection today.  History of pulmonary embolism continue Elqiuis 2.5mg  BID. -Patient also takes  plavix 75 mg for CAD.    Hyponatremia Progressively worse.  He has a history of smoking. I will obtain CT chest w contrast   Monoclonal B-cell lymphocytosis of undetermined significance flowcytometry showed stable monoclonal lymphocytosis clone, <5000 Stable white count.  Continue observation   Unintentional weight loss He weighed 226 Ib in Oct 2021, 222 Ib in March 2023, 213 Ib in  Dec 2023, March 2024 and now 206-208 Ib in March 2025.  Check CT abdomen pelvis w contrast.    Family history of pancreatic cancer Consider genetic counseling if he agrees.  Pending CT work up   Orders Placed This Encounter  Procedures   CT CHEST ABDOMEN PELVIS W CONTRAST    Standing Status:   Future    Expected Date:   05/04/2023    Expiration Date:   04/26/2024    If indicated for the ordered procedure, I authorize the administration of contrast media per Radiology protocol:   Yes    Does the patient have a contrast media/X-ray dye allergy?:   No    Preferred imaging location?:   [facility]    If indicated for the ordered procedure, I authorize the administration of oral contrast media per Radiology protocol:   Yes   TSH    Standing Status:   Future    Number of Occurrences:   1    Expected Date:   04/27/2023    Expiration Date:   04/26/2024   CMP (Cancer Center only)    Standing Status:   Future    Expected Date:   07/28/2023    Expiration Date:    04/26/2024   CBC with Differential (Cancer Center Only)    Standing Status:   Future    Expected Date:   07/28/2023    Expiration Date:   04/26/2024   Vitamin B12    Standing Status:   Future    Expected Date:   07/28/2023    Expiration Date:   04/26/2024   Follow up 3 months All questions were answered. The patient knows to call the clinic with any problems, questions or concerns.  Edward Patience, MD, PhD [facility] Hematology Oncology 04/27/2023      HISTORY OF PRESENTING ILLNESS:   Edward Mejia is a  78 y.o.  male with PMH including rheumatoid arthritis, peripheral vascular disease hypertension, Covid infection history,, was seen in consultation at the request of  Fisher, Demetrios Isaacs, MD  for evaluation of polycytosis Patient was accompanied by her wife who participated in providing medical history. February 2021, patient was hospitalized due to COVID-19 pneumonia, respiratory failure, treated with steroids, remdesivir, received bamlanivimab.  Hospitalization was also completed by [facility], bleeding was  thought to be secondary to hemorrhoids.  He was discharged on 04/07/2019 and presented back to emergency room on 04/12/2019 due to left lower extremity swelling, acute hypoxic respiratory failure.  Work-up showed acute bilateral pulmonary embolism, large left lower extremity DVT, thrombosis and IVC and left external iliac vein.  Patient  was started on anticoagulation and transition to Eliquis Patient has been on Eliquis since March 2021.  Patient also takes aspirin.  Patient has history of CAD, follows up with Dr. Lady Mejia.  Rheumatoid arthritis on Enbrel once a week.  Also on prednisone 7.5 mg daily. Patient has also been found to have erythrocytosis. 09/20/2019, CBC showed hemoglobin of 17.6, reviewed previous medical records, erythrocytosis can be traced back to at least 2017.  Patient also has leukocytosis since March 2021.  Leukocytosis has trended down recently and CBC on 09/20/2019 showed a white  count of 10.8, predominantly monocytosis.  Patient is a former smoker, 10-12-pack-year smoking history and he stopped smoking in 1984. Currently he has some chronic shortness of breath with exertion.  He has lost weight during his previous hospitalization and has gained weight since discharge.  Sometimes he feels sweaty.  Patient also reports intermittent dizziness/lightheaded episodes for the past couple of months.  He actually had an episode during today's visit and is spontaneously resolved after few minutes.  Denies any shortness of breath, chest pain, vision changes, focal deficits.  Patient has been referred to establish care with neurology for further evaluation.  #  psoriatic arthritis follows up with Edward Mej[facility] Edward Mejia.on chronic prednisone.  # skin squamous cell carcinoma removal from his scalp. #09/27/2021 He recently underwent cardiac cath for abnormal stress test and unstable angina s/p drug eluting coronary stent placement.   INTERVAL HISTORY Edward Mejia is a 78 y.o. male who has above history reviewed by me today presents for follow up visit for management of polycythemia vera Patient has been on Eliquis 2.5 mg twice daily.  Takes hydroxyurea 1000mg  daily except 500 mg on Sundays. Patient has received parenteral vitamin B12 injections.  B12 has improved however balancing problem remains similar. Patient has experienced weight loss, 5 pounds loss since his last visit 3 months ago.  He has good appetite. Wife is very concerned as subjectively she feels patient has lost a lot weight. He has good appetite.    .Review of Systems  Constitutional:  Positive for fatigue and unexpected weight change. Negative for appetite change, chills and fever.  HENT:   Negative for hearing loss and voice change.   Eyes:  Negative for eye problems and icterus.  Respiratory:  Negative for chest tightness, cough and shortness of breath.   Cardiovascular:  Negative for chest pain and leg  swelling.  Gastrointestinal:  Negative for abdominal distention, abdominal pain and blood in stool.  Endocrine: Negative for hot flashes.  Genitourinary:  Negative for difficulty urinating, dysuria and frequency.   Musculoskeletal:  Negative for arthralgias.  Skin:  Negative for itching and rash.  Neurological:  Negative for extremity weakness, light-headedness and numbness.  Hematological:  Negative for adenopathy. Bruises/bleeds easily.  Psychiatric/Behavioral:  Negative for confusion.     MEDICAL HISTORY:  Past Medical History:  Diagnosis Date   Arthritis    Bilateral pulmonary embolism (HCC) 03/2019   in setting of Covid-19 pneumonia   Cancer (HCC)    skin cancer   Cellulitis    Cellulitis    DVT (deep venous thrombosis) (HCC) 03/2019   in setting of Covid-19 pneumonia   Erythrocytosis 09/20/2019   H/O adenomatous polyp of colon 03/24/2015   HCAP (healthcare-associated pneumonia) 04/12/2019   History of brain disorder: history of amaurosis fugax 03/24/2015   History of pneumonia 04/04/2008   History of rheumatic fever 03/24/2015   DID Have Rheumatic Fever.     Hypertension  Left leg DVT (HCC) 04/12/2019   In setting of Covid-19 started anticoagulation 04-12-2019. Subsequently Dx PCV and long term NOAC per hematology    Peptic ulcer disease    Sepsis (HCC) 04/12/2019   secondary to Covid pneumonia    SURGICAL HISTORY: Past Surgical History:  Procedure Laterality Date   APPENDECTOMY     CORONARY STENT INTERVENTION N/A 01/24/2023   Procedure: CORONARY STENT INTERVENTION;  Surgeon: Marcina Millard, MD;  Location: [facility] INVASIVE CV LAB;  Service: Cardiovascular;  Laterality: N/A;   LEFT HEART CATH AND CORONARY ANGIOGRAPHY Left 02/06/2018   Procedure: LEFT HEART CATH AND CORONARY ANGIOGRAPHY;  Surgeon: Dalia Heading, MD;  Location: [facility] INVASIVE CV LAB;  Service: Cardiovascular;  Laterality: Left;   LEFT HEART CATH AND CORONARY ANGIOGRAPHY Left 01/24/2023   Procedure: LEFT  HEART CATH AND CORONARY ANGIOGRAPHY;  Surgeon: Marcina Millard, MD;  Location: [facility] INVASIVE CV LAB;  Service: Cardiovascular;  Laterality: Left;   MANDIBLE FRACTURE SURGERY     fractured jaw   PARTIAL GASTRECTOMY     peptic ulcer disease    SOCIAL HISTORY: Social History   Socioeconomic History   Marital status: Married    Spouse name: Andrey Campanile    Number of children: 4   Years of education: Not on file   Highest education level: 8th grade  Occupational History   Occupation: Disabled    Comment: due to Psoriatic Arthritis  Tobacco Use   Smoking status: Former    Current packs/day: 0.00    Types: Cigarettes    Quit date: 09/20/1982    Years since quitting: 40.6   Smokeless tobacco: Never   Tobacco comments:    quit over 40+ years ago  Vaping Use   Vaping status: Never Used  Substance and Sexual Activity   Alcohol use: No    Alcohol/week: 0.0 standard drinks of alcohol   Drug use: No   Sexual activity: Not on file  Other Topics Concern   Not on file  Social History Narrative   Lives with Gifford, wife.  No indoor pets.    Social Drivers of Corporate investment banker Strain: Low Risk  (02/11/2023)   Received [facility]   Overall Financial Resource Strain (CARDIA)    Difficulty of Paying Living Expenses: Not very hard  Food Insecurity: No Food Insecurity (02/11/2023)   Received from [facility]   Hunger Vital Sign    Worried About Running Out of Food in the Last Year: Never true    Ran Out of Food in the Last Year: Never true  Transportation Needs: No Transportation Needs (02/11/2023)   [facility] from [facility] - Transportation    In the past 12 months, has lack of transportation kept you from medical appointments or from getting medications?: No    Lack of Transportation (Non-Medical): No  Physical Activity: Unknown (01/08/2019)   Exercise Vital Sign    Days of Exercise per Week: Not on file     Minutes of Exercise per Session: 0 min  Stress: No Stress Concern Present (01/08/2019)   [employer] [facility] [employer] [facility] - Occupational Stress Questionnaire    Feeling of Stress : Not at all  Social Connections: Unknown (06/19/2021)   Received from [facility], [facility]   Social Network    Social Network: Not on file  Intimate Partner Violence: Not At Risk (01/24/2023)   Humiliation, Afraid, Rape, and Kick questionnaire    Fear  of Current or Ex-Partner: No    Emotionally Abused: No    Physically Abused: No    Sexually Abused: No    FAMILY HISTORY: Family History  Problem Relation Age of Onset   Cancer Mother        lung cancer   Heart disease Father 25       heart attack   Cancer Brother        pancreatic cancer   Cancer Brother        pancreatic cancer    ALLERGIES:  is allergic to pravachol [pravastatin].  MEDICATIONS:  Current Outpatient Medications  Medication Sig Dispense Refill   apixaban (ELIQUIS) 2.5 MG TABS tablet Take 1 tablet (2.5 mg total) by mouth 2 (two) times daily. 180 tablet 3   clopidogrel (PLAVIX) 75 MG tablet Take 1 tablet (75 mg total) by mouth daily with breakfast. 30 tablet 0   etanercept (ENBREL) 50 MG/ML injection Inject 50 mg into the skin every Thursday.      gabapentin (NEURONTIN) 300 MG capsule Take 900 mg by mouth See admin instructions. At 4 pm     HYDROcodone-acetaminophen (NORCO) 10-325 MG tablet Take 1 tablet by mouth every 6 (six) hours as needed for moderate pain (pain score 4-6).     hydrocortisone 2.5 % cream APPLY TOPICALLY TO THE AFFECTED AREA TWICE DAILY AS NEEDED 30 g 3   hydroxyurea (HYDREA) 500 MG capsule TAKE 1 CAPSULE BY MOUTH ON SUNDAYS AND 2 CAPSULES DAILY FOR THE REST OF THE WEEK. MAY TAKE WITH FOOD TO MINIMIZE GI SIDE EFFECTS 180 capsule 1   isosorbide mononitrate (IMDUR) 120 MG 24 hr tablet Take 120 mg by mouth daily.      predniSONE (DELTASONE) 5 MG tablet Take 7.5 mg by mouth every morning.     ranolazine  (RANEXA) 1000 MG SR tablet Take 1,000 mg by mouth 2 (two) times daily.     nitroGLYCERIN (NITROSTAT) 0.4 MG SL tablet Place 0.4 mg under the tongue every 5 (five) minutes as needed for chest pain. (Patient not taking: Reported on 01/26/2023)     valsartan (DIOVAN) 160 MG tablet TAKE 1 TABLET(160 MG) BY MOUTH DAILY (Patient not taking: Reported on 04/27/2023) 90 tablet 4   No current facility-administered medications for this visit.     PHYSICAL EXAMINATION: ECOG PERFORMANCE STATUS: 1 - Symptomatic but completely ambulatory Today's Vitals   04/27/23 1023 04/27/23 1024  BP: (!) 168/81 (!) 161/78  Pulse: 63   Resp: 18   Temp: 97.9 F (36.6 C)   Weight: 208 lb 3.2 oz (94.4 kg)   PainSc: 6    PainLoc: Back    Body mass index is 28.24 kg/m.  Marland Kitchen Physical Exam Constitutional:      General: He is not in acute distress. HENT:     Head: Normocephalic and atraumatic.  Eyes:     General: No scleral icterus. Cardiovascular:     Rate and Rhythm: Normal rate and regular rhythm.  Pulmonary:     Effort: Pulmonary effort is normal. No respiratory distress.  Abdominal:     General: Bowel sounds are normal.     Palpations: Abdomen is soft.  Musculoskeletal:        General: No deformity. Normal range of motion.     Cervical back: Normal range of motion and neck supple.  Skin:    General: Skin is warm and dry.  Neurological:     Mental Status: He is alert and oriented to person, place, and  time. Mental status is at baseline.  Psychiatric:        Mood and Affect: Mood normal.     LABORATORY DATA:  I have reviewed the data as listed    Latest Ref Rng & Units 04/24/2023   11:00 AM 01/26/2023   10:03 AM 01/25/2023    5:57 AM  CBC  WBC 4.0 - 10.5 K/uL 10.2  8.9  6.9   Hemoglobin 13.0 - 17.0 g/dL 81.1  91.4  78.2   Hematocrit 39.0 - 52.0 % 44.0  43.5  42.1   Platelets 150 - 400 K/uL 145  128  110       Latest Ref Rng & Units 04/24/2023   11:00 AM 01/26/2023   10:02 AM 01/25/2023     5:57 AM  CMP  Glucose 70 - 99 mg/dL 85  96  86   BUN 8 - 23 mg/dL 10  14  12    Creatinine 0.61 - 1.24 mg/dL 9.56  2.13  0.86   Sodium 135 - 145 mmol/L 127  129  132   Potassium 3.5 - 5.1 mmol/L 4.2  4.7  4.3   Chloride 98 - 111 mmol/L 95  94  101   CO2 22 - 32 mmol/L 27  25  25    Calcium 8.9 - 10.3 mg/dL 8.1  8.6  8.2   Total Protein 6.5 - 8.1 g/dL 6.0  6.0    Total Bilirubin 0.0 - 1.2 mg/dL 1.3  1.4    Alkaline Phos 38 - 126 U/L 31  34    AST 15 - 41 U/L 14  16    ALT 0 - 44 U/L 11  11       Iron/TIBC/Ferritin/ %Sat    Component Value Date/Time   IRON 49 09/20/2019 1201   TIBC 318 09/20/2019 1201   FERRITIN 24 09/20/2019 1201   IRONPCTSAT 15 (L) 09/20/2019 1201      RADIOGRAPHIC STUDIES: I have personally reviewed the radiological images as listed and agreed with the findings in the report. US Venous Img Lower Unilateral Left Result Date: 02/11/2023 CLINICAL DATA:  Rule out DVT.  Left leg swelling and redness. EXAM: Left LOWER EXTREMITY VENOUS DOPPLER ULTRASOUND TECHNIQUE: Gray-scale sonography with compression, as well as color and duplex ultrasound, were performed to evaluate the deep venous system(s) from the level of the common femoral vein through the popliteal and proximal calf veins. COMPARISON:  None Available. FINDINGS: VENOUS Normal compressibility of the common femoral, superficial femoral, and popliteal veins, as well as the visualized calf veins. Visualized portions of profunda femoral vein and great saphenous vein unremarkable. No filling defects to suggest DVT on grayscale or color Doppler imaging. Doppler waveforms show normal direction of venous flow, normal respiratory plasticity and response to augmentation. Limited views of the contralateral common femoral vein are unremarkable. OTHER None. Limitations: none IMPRESSION: Negative. Electronically Signed   By: Signa Kell M.D.   On: 02/11/2023 11:33

## 2023-04-27 NOTE — Assessment & Plan Note (Signed)
 continue Elqiuis 2.5mg  BID. -Patient also takes  plavix 75 mg for CAD.

## 2023-04-28 ENCOUNTER — Telehealth: Payer: Self-pay | Admitting: Oncology

## 2023-04-28 NOTE — Telephone Encounter (Signed)
 Dr.Yu did the P2P and said the CT was now approved. I was able to r/s the appt for the same day and time and location.   I called pt spouse and let her know the update and the appt details.

## 2023-04-28 NOTE — Telephone Encounter (Signed)
 Per secure chat from Luvenia Heller "CT will need to be cx ~ P2P will be required by MD/NP calling [phone] case# [id number] pt must have documented 5% wt loss "   I spoke with pt spouse and let her know the update and that the CT is being canceled for now.   I called centralized scheduling and canceled the CT and told them the reason why as well.

## 2023-05-01 ENCOUNTER — Ambulatory Visit
Admission: RE | Admit: 2023-05-01 | Discharge: 2023-05-01 | Disposition: A | Discharge status: Home / Self Care | Source: Ambulatory Visit | Attending: Internal Medicine | Admitting: Internal Medicine

## 2023-05-01 DIAGNOSIS — M8580 Other specified disorders of bone density and structure, unspecified site: Secondary | ICD-10-CM | POA: Insufficient documentation

## 2023-05-01 DIAGNOSIS — M85852 Other specified disorders of bone density and structure, left thigh: Secondary | ICD-10-CM | POA: Diagnosis not present

## 2023-05-05 ENCOUNTER — Ambulatory Visit
Admission: RE | Admit: 2023-05-05 | Discharge: 2023-05-05 | Disposition: A | Discharge status: Home / Self Care | Source: Ambulatory Visit | Attending: Oncology | Admitting: Oncology

## 2023-05-05 ENCOUNTER — Ambulatory Visit: Admission: RE | Admit: 2023-05-05 | Source: Ambulatory Visit

## 2023-05-05 DIAGNOSIS — I700 Atherosclerosis of aorta: Secondary | ICD-10-CM | POA: Diagnosis not present

## 2023-05-05 DIAGNOSIS — I25110 Atherosclerotic heart disease of native coronary artery with unstable angina pectoris: Secondary | ICD-10-CM | POA: Diagnosis not present

## 2023-05-05 DIAGNOSIS — Z800 Family history of malignant neoplasm of digestive organs: Secondary | ICD-10-CM | POA: Insufficient documentation

## 2023-05-05 DIAGNOSIS — R634 Abnormal weight loss: Secondary | ICD-10-CM | POA: Insufficient documentation

## 2023-05-05 MED ORDER — IOHEXOL 300 MG/ML  SOLN
100.0000 mL | Freq: Once | INTRAMUSCULAR | Status: AC | PRN
  Administered 2023-05-05: 100 mL via INTRAVENOUS

## 2023-05-16 ENCOUNTER — Encounter: Payer: Self-pay | Admitting: Oncology

## 2023-06-02 ENCOUNTER — Ambulatory Visit: Payer: Self-pay

## 2023-06-02 NOTE — Telephone Encounter (Signed)
 Chief Complaint: weakness and fatigue Symptoms: weakness, fatigue, dizziness Frequency: years Pertinent Negatives: Patient denies  Disposition: [] ED /[] Urgent Care (no appt availability in office) / [x] Appointment(In office/virtual)/ []  [facility]/ [] Home Care/ [] Refused Recommended Disposition /[] [facility]/ []  Follow-up with PCP Additional Notes: pt states he has been exeperiencing this for a year but the last few weeks has gotten worse. States he has to hold on to things just to walk due to his balance. States pt had a fall a week ago he just last balance and fell.   Copied from CRM [id number]. Topic: Clinical - Red Word Triage >> Jun 02, 2023 11:20 AM Jorie Newness J wrote: [facility] that prompted transfer to Nurse Triage: Fatigue,light headed, dizzy. Pt had a fall, his wife states he is too weak to walk Reason for Disposition  Weakness is a chronic symptom (recurrent or ongoing AND present > 4 weeks)  Answer Assessment - Initial Assessment Questions 1. DESCRIPTION: "Describe how you are feeling."     weakness 2. SEVERITY: "How bad is it?"  "Can you stand and walk?"   - MILD (0-3): Feels weak or tired, but does not interfere with work, school or normal activities.   - MODERATE (4-7): Able to stand and walk; weakness interferes with work, school, or normal activities.   - SEVERE (8-10): Unable to stand or walk; unable to do usual activities.     mod 3. ONSET: "When did these symptoms begin?" (e.g., hours, days, weeks, months)     Weeks ago 4. CAUSE: "What do you think is causing the weakness or fatigue?" (e.g., not drinking enough fluids, medical problem, trouble sleeping)     unknown 5. NEW MEDICINES:  "Have you started on any new medicines recently?" (e.g., opioid pain medicines, benzodiazepines, muscle relaxants, antidepressants, antihistamines, neuroleptics, beta blockers)     no 6. OTHER SYMPTOMS: "Do you have any other symptoms?" (e.g., chest pain, fever, cough,  SOB, vomiting, diarrhea, bleeding, other areas of pain)     Light lighted and dizziness  Protocols used: Weakness (Generalized) and Fatigue-A-AH

## 2023-06-05 ENCOUNTER — Encounter: Payer: Self-pay | Admitting: Family Medicine

## 2023-06-05 ENCOUNTER — Ambulatory Visit (INDEPENDENT_AMBULATORY_CARE_PROVIDER_SITE_OTHER): Admitting: Family Medicine

## 2023-06-05 VITALS — BP 133/67 | HR 64 | Resp 16 | Wt 206.0 lb

## 2023-06-05 DIAGNOSIS — R7989 Other specified abnormal findings of blood chemistry: Secondary | ICD-10-CM | POA: Diagnosis not present

## 2023-06-05 DIAGNOSIS — I951 Orthostatic hypotension: Secondary | ICD-10-CM

## 2023-06-05 DIAGNOSIS — I25118 Atherosclerotic heart disease of native coronary artery with other forms of angina pectoris: Secondary | ICD-10-CM

## 2023-06-05 DIAGNOSIS — R0609 Other forms of dyspnea: Secondary | ICD-10-CM | POA: Diagnosis not present

## 2023-06-05 DIAGNOSIS — L4050 Arthropathic psoriasis, unspecified: Secondary | ICD-10-CM

## 2023-06-05 NOTE — Patient Instructions (Signed)
 Edward Mejia  Please review the attached list of medications and notify my office if there are any errors.   . Please bring all of your medications to every appointment so we can make sure that our medication list is the same as yours.

## 2023-06-05 NOTE — Progress Notes (Signed)
 Established patient visit   Patient: Edward Mejia   DOB: October 17, 1945   78 y.o. Male  MRN: [id number] Visit Date: 06/05/2023  Today's healthcare provider: Jeralene Mom, MD   Chief Complaint  Patient presents with   Fatigue    X last couple of weeks.   Dizziness   Shortness of Breath   Impaired balance   Fall    Patient fell last week. Patient has had 3 falls.    sleepy   Subjective    Discussed the use of AI scribe software for clinical note transcription with the patient, who gave verbal consent to proceed.  History of Present Illness   [name] "[name]" is a 78 year old male with coronary artery disease, PVD, PCV, and psoriatic arthritis who presents with worsening dizziness and fatigue.  Approximately three months ago, he began experiencing dizziness, initially noticed when getting out of the shower. The dizziness has progressively worsened, and he now experiences episodes where the room appears to move sideways. He feels lightheaded, particularly when standing up quickly, and describes feeling 'terrible' with a constant desire to sleep. No sensation of the room spinning is noted.  He has a history of falls, with three incidents mentioned, including a recent fall from the deck about a week ago, resulting in a head injury and shoulder pain. His wife has been managing his wounds with antibiotic cream and bandages. He reports weakness, weight loss, and difficulty walking, requiring support to move around. His legs feel weak, and his knees feel numb and swollen, though they are not visibly swollen.  He has a history of coronary artery disease, with three stents placed, the most recent being a few months ago due to a collapsed stent. He is no longer on blood pressure medications as his blood pressure has been stable. He continues to take isosorbide , ranolazine , and prednisone . He recently had labs by his rheumatologist finding very low vitamin D levels and has been  started on supplementation. No heart racing or fluttering, but he mentions a past episode of heart pain and pressure leading to stent placement.  He had COVID-19 in 2021, after which he reports worsening memory issues. He has undergone extensive workup, including EKG, echocardiogram, CT scan, and brain scan, all of which were reportedly normal. He has had clopidogrel  and valsartan  discontinued by his cardiologist since his BP has been well controlled and had unremarkable echocardiogram. He does continue on Renexa and isosorbide  mononitrate. He is also noted to have chronically mildly elevated TSH, last checked by his oncologist in March and was 4.6. He is also noted to have been on prednisone  since 2018 for psoriatic arthritis.   His sleep pattern has changed significantly; he sleeps throughout the night and most of the day, waking only briefly to eat. No issues with breathing during sleep, though his wife occasionally observes him taking deep breaths suddenly. He reports frequent nighttime urination, about two to three times per night.       Medications: Outpatient Medications Prior to Visit  Medication Sig   apixaban  (ELIQUIS ) 2.5 MG TABS tablet Take 1 tablet (2.5 mg total) by mouth 2 (two) times daily.   etanercept (ENBREL) 50 MG/ML injection Inject 50 mg into the skin every Thursday.    gabapentin  (NEURONTIN ) 300 MG capsule Take 900 mg by mouth See admin instructions. At 4 pm   HYDROcodone -acetaminophen  (NORCO) 10-325 MG tablet Take 1 tablet by mouth every 6 (six) hours as needed for moderate  pain (pain score 4-6).   hydrocortisone  2.5 % cream APPLY TOPICALLY TO THE AFFECTED AREA TWICE DAILY AS NEEDED   hydroxyurea  (HYDREA ) 500 MG capsule TAKE 1 CAPSULE BY MOUTH ON SUNDAYS AND 2 CAPSULES DAILY FOR THE REST OF THE WEEK. MAY TAKE WITH FOOD TO MINIMIZE GI SIDE EFFECTS   isosorbide  mononitrate (IMDUR ) 120 MG 24 hr tablet Take 120 mg by mouth daily.    nitroGLYCERIN  (NITROSTAT ) 0.4 MG SL tablet  Place 0.4 mg under the tongue every 5 (five) minutes as needed for chest pain.   predniSONE  (DELTASONE ) 5 MG tablet Take 7.5 mg by mouth every morning.   ranolazine  (RANEXA ) 1000 MG SR tablet Take 1,000 mg by mouth 2 (two) times daily.   clopidogrel  (PLAVIX ) 75 MG tablet Take 1 tablet (75 mg total) by mouth daily with breakfast.   valsartan  (DIOVAN ) 160 MG tablet TAKE 1 TABLET(160 MG) BY MOUTH DAILY (Patient not taking: No sig reported)   No facility-administered medications prior to visit.   Review of Systems     Objective    BP 133/67 (BP Location: Right Arm, Patient Position: Sitting, Cuff Size: Normal)   Pulse 64   Resp 16   Wt 206 lb (93.4 kg)   SpO2 100%   BMI 27.94 kg/m   Orthostatic Vitals for the past 48 hrs (Last 6 readings): 06/05/23 1336 Sitting 133/67 64 R Arm Normal -- -- -- -- -- -- --  06/05/23 1355 -- -- -- -- -- Ortho- statics -- -- -- -- -- --  06/05/23 1357 -- -- -- -- -- -- 109/55 67 122/63 64 129/68 63    Physical Exam   General: Appearance:    Well developed, well nourished male in no acute distress  Eyes:    PERRL, conjunctiva/corneas clear, EOM's intact       Lungs:     Clear to auscultation bilaterally, respirations unlabored  Heart:    Normal heart rate. Normal rhythm. No murmurs, rubs, or gallops.    MS:   All extremities are intact.    Neurologic:   Awake, alert, oriented x 3. No apparent focal neurological defect.         Assessment & Plan        Orthostatic hypotension/fatigue Confirmed by significant drop in blood pressure upon standing, likely related to long-term use of prednisone , isosorbide , and ranolazine . valsartan  has already been discontinued. Ddx includes adverse reaction to medications including prednisone , hydrocodone , isosorbide  or Renexa, and includes adrenal insufficiency secondary to chronic prednisone  therapy.  - Order morning fasting TSH, T4, T3, cortisol levels, and met C. - Advise caution when standing and use of a  walker. - Encourage increased fluid intake.  Chronic pain due to back and hip issues Severe chronic pain managed with four pain medications daily. Considering nerve ablation due to persistent symptoms. - Continue current pain management regimen. - Proceed with nerve ablation for back pain.   Vitamin D deficiency Identified in recent blood work, supplementation started. - Continue vitamin D supplementation as prescribed.         Jeralene Mom, MD  [facility] [phone] (phone) [phone] (fax)  [facility]

## 2023-06-07 ENCOUNTER — Other Ambulatory Visit: Payer: Self-pay | Admitting: Family Medicine

## 2023-06-07 DIAGNOSIS — R7989 Other specified abnormal findings of blood chemistry: Secondary | ICD-10-CM

## 2023-06-07 LAB — COMPREHENSIVE METABOLIC PANEL WITH GFR
ALT: 9 IU/L (ref 0–44)
AST: 13 IU/L (ref 0–40)
Albumin: 4.1 g/dL (ref 3.8–4.8)
Alkaline Phosphatase: 41 IU/L — ABNORMAL LOW (ref 44–121)
BUN/Creatinine Ratio: 9 — ABNORMAL LOW (ref 10–24)
BUN: 9 mg/dL (ref 8–27)
Bilirubin Total: 1 mg/dL (ref 0.0–1.2)
CO2: 24 mmol/L (ref 20–29)
Calcium: 8.6 mg/dL (ref 8.6–10.2)
Chloride: 98 mmol/L (ref 96–106)
Creatinine, Ser: 0.95 mg/dL (ref 0.76–1.27)
Globulin, Total: 1.4 g/dL — ABNORMAL LOW (ref 1.5–4.5)
Glucose: 88 mg/dL (ref 70–99)
Potassium: 4.2 mmol/L (ref 3.5–5.2)
Sodium: 135 mmol/L (ref 134–144)
Total Protein: 5.5 g/dL — ABNORMAL LOW (ref 6.0–8.5)
eGFR: 82 mL/min/{1.73_m2} (ref >59)

## 2023-06-07 LAB — T4, FREE: Free T4: 1.15 ng/dL (ref 0.82–1.77)

## 2023-06-07 LAB — T3, FREE: T3, Free: 2.5 pg/mL (ref 2.0–4.4)

## 2023-06-07 LAB — CORTISOL: Cortisol: 0.6 ug/dL — ABNORMAL LOW (ref 6.2–19.4)

## 2023-06-07 LAB — TSH: TSH: 4.04 u[IU]/mL (ref 0.450–4.500)

## 2023-06-10 LAB — PSA: Prostate Specific Ag, Serum: 0.4 ng/mL (ref 0.0–4.0)

## 2023-06-10 LAB — SPECIMEN STATUS REPORT

## 2023-07-27 ENCOUNTER — Inpatient Hospital Stay: Attending: Oncology

## 2023-07-27 DIAGNOSIS — D45 Polycythemia vera: Secondary | ICD-10-CM | POA: Insufficient documentation

## 2023-07-27 DIAGNOSIS — D696 Thrombocytopenia, unspecified: Secondary | ICD-10-CM | POA: Diagnosis not present

## 2023-07-27 DIAGNOSIS — I10 Essential (primary) hypertension: Secondary | ICD-10-CM | POA: Insufficient documentation

## 2023-07-27 DIAGNOSIS — Z86711 Personal history of pulmonary embolism: Secondary | ICD-10-CM | POA: Diagnosis not present

## 2023-07-27 DIAGNOSIS — E538 Deficiency of other specified B group vitamins: Secondary | ICD-10-CM | POA: Diagnosis present

## 2023-07-27 DIAGNOSIS — D72820 Lymphocytosis (symptomatic): Secondary | ICD-10-CM | POA: Insufficient documentation

## 2023-07-27 DIAGNOSIS — M069 Rheumatoid arthritis, unspecified: Secondary | ICD-10-CM | POA: Insufficient documentation

## 2023-07-27 LAB — CMP (CANCER CENTER ONLY)
ALT: 10 U/L (ref 0–44)
AST: 17 U/L (ref 15–41)
Albumin: 3.6 g/dL (ref 3.5–5.0)
Alkaline Phosphatase: 27 U/L — ABNORMAL LOW (ref 38–126)
Anion gap: 7 (ref 5–15)
BUN: 14 mg/dL (ref 8–23)
CO2: 27 mmol/L (ref 22–32)
Calcium: 8.2 mg/dL — ABNORMAL LOW (ref 8.9–10.3)
Chloride: 101 mmol/L (ref 98–111)
Creatinine: 0.93 mg/dL (ref 0.61–1.24)
GFR, Estimated: >60 mL/min (ref >60)
Glucose, Bld: 93 mg/dL (ref 70–99)
Potassium: 4.1 mmol/L (ref 3.5–5.1)
Sodium: 135 mmol/L (ref 135–145)
Total Bilirubin: 1.5 mg/dL — ABNORMAL HIGH (ref 0.0–1.2)
Total Protein: 5.7 g/dL — ABNORMAL LOW (ref 6.5–8.1)

## 2023-07-27 LAB — CBC WITH DIFFERENTIAL (CANCER CENTER ONLY)
Abs Immature Granulocytes: 0.08 10*3/uL — ABNORMAL HIGH (ref 0.00–0.07)
Basophils Absolute: 0.1 10*3/uL (ref 0.0–0.1)
Basophils Relative: 1 %
Eosinophils Absolute: 0 10*3/uL (ref 0.0–0.5)
Eosinophils Relative: 1 %
HCT: 40.9 % (ref 39.0–52.0)
Hemoglobin: 13.5 g/dL (ref 13.0–17.0)
Immature Granulocytes: 1 %
Lymphocytes Relative: 33 %
Lymphs Abs: 2.7 10*3/uL (ref 0.7–4.0)
MCH: 36.7 pg — ABNORMAL HIGH (ref 26.0–34.0)
MCHC: 33 g/dL (ref 30.0–36.0)
MCV: 111.1 fL — ABNORMAL HIGH (ref 80.0–100.0)
Monocytes Absolute: 0.7 10*3/uL (ref 0.1–1.0)
Monocytes Relative: 9 %
Neutro Abs: 4.6 10*3/uL (ref 1.7–7.7)
Neutrophils Relative %: 55 %
Platelet Count: 90 10*3/uL — ABNORMAL LOW (ref 150–400)
RBC: 3.68 MIL/uL — ABNORMAL LOW (ref 4.22–5.81)
RDW: 15.1 % (ref 11.5–15.5)
WBC Count: 8.3 10*3/uL (ref 4.0–10.5)
nRBC: 0 % (ref 0.0–0.2)

## 2023-07-27 LAB — VITAMIN B12: Vitamin B-12: 256 pg/mL (ref 180–914)

## 2023-08-01 ENCOUNTER — Encounter: Payer: Self-pay | Admitting: Oncology

## 2023-08-01 ENCOUNTER — Inpatient Hospital Stay

## 2023-08-01 ENCOUNTER — Inpatient Hospital Stay: Admitting: Oncology

## 2023-08-01 VITALS — BP 174/81 | HR 63 | Temp 97.4°F | Resp 18 | Wt 211.3 lb

## 2023-08-01 DIAGNOSIS — D72820 Lymphocytosis (symptomatic): Secondary | ICD-10-CM | POA: Diagnosis not present

## 2023-08-01 DIAGNOSIS — D45 Polycythemia vera: Secondary | ICD-10-CM

## 2023-08-01 DIAGNOSIS — D751 Secondary polycythemia: Secondary | ICD-10-CM

## 2023-08-01 DIAGNOSIS — Z86711 Personal history of pulmonary embolism: Secondary | ICD-10-CM | POA: Diagnosis not present

## 2023-08-01 DIAGNOSIS — E538 Deficiency of other specified B group vitamins: Secondary | ICD-10-CM

## 2023-08-01 MED ORDER — CYANOCOBALAMIN 1000 MCG/ML IJ SOLN
1000.0000 ug | Freq: Once | INTRAMUSCULAR | Status: AC
  Administered 2023-08-01: 1000 ug via INTRAMUSCULAR
  Filled 2023-08-01: qty 1

## 2023-08-01 NOTE — Assessment & Plan Note (Signed)
 Recommend B12 1000 mcg daily x 4 followed by weekly x 4.

## 2023-08-01 NOTE — Assessment & Plan Note (Signed)
 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  Continue observation

## 2023-08-01 NOTE — Assessment & Plan Note (Addendum)
 Given that he is getting intramuscular B12 injection, recommend patient to temporarily hold off Elqiuis 2.5mg  BID. -Patient also takes  plavix  75 mg for CAD.

## 2023-08-01 NOTE — Progress Notes (Signed)
 Hematology/Oncology Progress note [phone] [phone]     CHIEF COMPLAINTS/REASON FOR VISIT:  Follow-up for polycythemia vera-Jak [id number] mutation  ASSESSMENT & PLAN:   Polycythemia vera (HCC) JAK2 V617F mutation. Labs reviewed and discussed with patient. Stable counts Due to thrombocytopenia, hold off hydroxyurea  500mg  on Sundays, and 1000 mg daily for the rest of the week. Repeat CBC in 2 weeks.  History of pulmonary embolism Given that he is getting intramuscular B12 injection, recommend patient to temporarily hold off Elqiuis 2.5mg  BID. -Patient also takes  plavix  75 mg for CAD.    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  Continue observation   B12 deficiency Recommend B12 1000 mcg daily x 4 followed by weekly x 4.   Orders Placed This Encounter  Procedures   Immature Platelet Fraction    Standing Status:   Future    Expected Date:   08/08/2023    Expiration Date:   11/06/2023   Technologist smear review    Standing Status:   Future    Expected Date:   08/08/2023    Expiration Date:   11/06/2023    Clinical information::   erythrocytosis   CBC with Differential (Cancer Center Only)    Standing Status:   Future    Expected Date:   08/08/2023    Expiration Date:   11/06/2023   CBC with Differential (Cancer Center Only)    Standing Status:   Future    Expected Date:   08/29/2023    Expiration Date:   11/27/2023   Immature Platelet Fraction    Standing Status:   Future    Expected Date:   08/29/2023    Expiration Date:   11/27/2023   Follow up 4 weeks   All questions were answered. The patient knows to call the clinic with any problems, questions or concerns.  Zelphia Cap, MD, PhD [facility] Hematology Oncology 08/01/2023      HISTORY OF PRESENTING ILLNESS:   Edward Mejia is a  78 y.o.  male with PMH including rheumatoid arthritis, peripheral vascular  disease hypertension, Covid infection history,, was seen in consultation at the request of  Fisher, Nancyann BRAVO, MD  for evaluation of polycytosis Patient was accompanied by her wife who participated in providing medical history. February 2021, patient was hospitalized due to COVID-19 pneumonia, respiratory failure, treated with steroids, remdesivir , received bamlanivimab .  Hospitalization was also completed by [facility], bleeding was  thought to be secondary to hemorrhoids.  He was discharged on 04/07/2019 and presented back to emergency room on 04/12/2019 due to left lower extremity swelling, acute hypoxic respiratory failure.  Work-up showed acute bilateral pulmonary embolism, large left lower extremity DVT, thrombosis and IVC and left external iliac vein.  Patient was started on anticoagulation and transition to Eliquis  Patient has been on Eliquis  since March 2021.  Patient also takes aspirin .  Patient has history of CAD, follows up with Dr. Bosie.  Rheumatoid arthritis on Enbrel once a week.  Also on prednisone  7.5 mg daily. Patient has also been found to have erythrocytosis. 09/20/2019, CBC showed hemoglobin of 17.6, reviewed previous medical records, erythrocytosis can be traced back to at least 2017.  Patient also has leukocytosis since March 2021.  Leukocytosis has trended down recently and CBC on 09/20/2019 showed a white count of 10.8, predominantly monocytosis.  Patient is a former smoker, 10-12-pack-year smoking history and he stopped smoking in 1984. Currently he has some chronic shortness of breath  with exertion.  He has lost weight during his previous hospitalization and has gained weight since discharge.  Sometimes he feels sweaty.  Patient also reports intermittent dizziness/lightheaded episodes for the past couple of months.  He actually had an episode during today's visit and is spontaneously resolved after few minutes.  Denies any shortness of breath, chest pain, vision changes, focal deficits.   Patient has been referred to establish care with neurology for further evaluation.  #  psoriatic arthritis follows up with [facility] Dr. Selma.on chronic prednisone .  # skin squamous cell carcinoma removal from his scalp. #09/27/2021 He recently underwent cardiac cath for abnormal stress test and unstable angina s/p drug eluting coronary stent placement.   INTERVAL HISTORY Edward Mejia is a 78 y.o. male who has above history reviewed by me today presents for follow up visit for management of polycythemia vera Patient has been on Eliquis  2.5 mg twice daily.  Takes hydroxyurea  1000mg  daily except 500 mg on Sundays. Patient reports dizziness and lightheaded, balance issue.  He had a fall from deck, had soft tissue contusion on arm and scalp. Patient has experienced weight loss, 5 pounds loss since his last visit 3 months ago.  He has good appetite.  And weight.  He has wife reports that patient has had adjustment of blood pressure regimen.  Blood pressure has been not very well-controlled.  .Review of Systems  Constitutional:  Positive for fatigue. Negative for appetite change, chills, fever and unexpected weight change.  HENT:   Negative for hearing loss and voice change.   Eyes:  Negative for eye problems and icterus.  Respiratory:  Negative for chest tightness, cough and shortness of breath.   Cardiovascular:  Negative for chest pain and leg swelling.  Gastrointestinal:  Negative for abdominal distention, abdominal pain and blood in stool.  Endocrine: Negative for hot flashes.  Genitourinary:  Negative for difficulty urinating, dysuria and frequency.   Musculoskeletal:  Positive for gait problem. Negative for arthralgias.  Skin:  Negative for itching and rash.  Neurological:  Positive for dizziness and gait problem. Negative for extremity weakness, light-headedness and numbness.       Balance issue  Hematological:  Negative for adenopathy. Bruises/bleeds easily.   Psychiatric/Behavioral:  Negative for confusion.     MEDICAL HISTORY:  Past Medical History:  Diagnosis Date   Arthritis    Bilateral pulmonary embolism (HCC) 03/2019   in setting of Covid-19 pneumonia   Cancer (HCC)    skin cancer   Cellulitis    Cellulitis    DVT (deep venous thrombosis) (HCC) 03/2019   in setting of Covid-19 pneumonia   Erythrocytosis 09/20/2019   H/O adenomatous polyp of colon 03/24/2015   HCAP (healthcare-associated pneumonia) 04/12/2019   History of brain disorder: history of amaurosis fugax 03/24/2015   History of pneumonia 04/04/2008   History of rheumatic fever 03/24/2015   DID Have Rheumatic Fever.     Hypertension    Left leg DVT (HCC) 04/12/2019   In setting of Covid-19 started anticoagulation 04-12-2019. Subsequently Dx PCV and long term NOAC per hematology    Peptic ulcer disease    Sepsis (HCC) 04/12/2019   secondary to Covid pneumonia    SURGICAL HISTORY: Past Surgical History:  Procedure Laterality Date   APPENDECTOMY     CORONARY STENT INTERVENTION N/A 01/24/2023   Procedure: CORONARY STENT INTERVENTION;  Surgeon: Ammon Blunt, MD;  Location: [facility] INVASIVE CV LAB;  Service: Cardiovascular;  Laterality: N/A;   LEFT HEART CATH AND  CORONARY ANGIOGRAPHY Left 02/06/2018   Procedure: LEFT HEART CATH AND CORONARY ANGIOGRAPHY;  Surgeon: Bosie Vinie LABOR, MD;  Location: [facility] INVASIVE CV LAB;  Service: Cardiovascular;  Laterality: Left;   LEFT HEART CATH AND CORONARY ANGIOGRAPHY Left 01/24/2023   Procedure: LEFT HEART CATH AND CORONARY ANGIOGRAPHY;  Surgeon: Ammon Blunt, MD;  Location: [facility] INVASIVE CV LAB;  Service: Cardiovascular;  Laterality: Left;   MANDIBLE FRACTURE SURGERY     fractured jaw   PARTIAL GASTRECTOMY     peptic ulcer disease    SOCIAL HISTORY: Social History   Socioeconomic History   Marital status: Married    Spouse name: Particia    Number of children: 4   Years of education: Not on file   Highest education level:  8th grade  Occupational History   Occupation: Disabled    Comment: due to Psoriatic Arthritis  Tobacco Use   Smoking status: Former    Current packs/day: 0.00    Types: Cigarettes    Quit date: 09/20/1982    Years since quitting: 40.8   Smokeless tobacco: Never   Tobacco comments:    quit over 40+ years ago  Vaping Use   Vaping status: Never Used  Substance and Sexual Activity   Alcohol use: No    Alcohol/week: 0.0 standard drinks of alcohol   Drug use: No   Sexual activity: Not on file  Other Topics Concern   Not on file  Social History Narrative   Lives with [place], wife.  No indoor pets.    Social Drivers of Corporate investment banker Strain: Low Risk  (07/11/2023)   Received [facility]   Overall Financial Resource Strain (CARDIA)    Difficulty of Paying Living Expenses: Not very hard  Food Insecurity: No Food Insecurity (07/11/2023)   Received from [facility]   Hunger Vital Sign    Within the past 12 months, you worried that your food would run out before you got the money to buy more.: Never true    Within the past 12 months, the food you bought just didn't last and you didn't have money to get more.: Never true  Transportation Needs: No Transportation Needs (07/11/2023)   Received from [facility] - Transportation    In the past 12 months, has lack of transportation kept you from medical appointments or from getting medications?: No    Lack of Transportation (Non-Medical): No  Physical Activity: Unknown (01/08/2019)   Exercise Vital Sign    Days of Exercise per Week: Not on file    Minutes of Exercise per Session: 0 min  Stress: No Stress Concern Present (01/08/2019)   [employer] of Occupational [facility] - Occupational Stress Questionnaire    Feeling of Stress : Not at all  Social Connections: Unknown (06/19/2021)   Received from [facility]   Social Network    Social Network: Not on file   Intimate Partner Violence: Not At Risk (01/24/2023)   Humiliation, Afraid, Rape, and Kick questionnaire    Fear of Current or Ex-Partner: No    Emotionally Abused: No    Physically Abused: No    Sexually Abused: No    FAMILY HISTORY: Family History  Problem Relation Age of Onset   Cancer Mother        lung cancer   Heart disease Father 71       heart attack   Cancer Brother  pancreatic cancer   Cancer Brother        pancreatic cancer    ALLERGIES:  is allergic to pravachol [pravastatin].  MEDICATIONS:  Current Outpatient Medications  Medication Sig Dispense Refill   alendronate (FOSAMAX) 70 MG tablet Take 70 mg by mouth once a week.     apixaban  (ELIQUIS ) 2.5 MG TABS tablet Take 1 tablet (2.5 mg total) by mouth 2 (two) times daily. 180 tablet 3   clopidogrel  (PLAVIX ) 75 MG tablet Take 1 tablet (75 mg total) by mouth daily with breakfast. 30 tablet 0   etanercept (ENBREL) 50 MG/ML injection Inject 50 mg into the skin every Thursday.      gabapentin  (NEURONTIN ) 300 MG capsule Take 900 mg by mouth See admin instructions. At 4 pm     HYDROcodone -acetaminophen  (NORCO) 10-325 MG tablet Take 1 tablet by mouth every 6 (six) hours as needed for moderate pain (pain score 4-6).     hydrocortisone  2.5 % cream APPLY TOPICALLY TO THE AFFECTED AREA TWICE DAILY AS NEEDED 30 g 3   hydroxyurea  (HYDREA ) 500 MG capsule TAKE 1 CAPSULE BY MOUTH ON SUNDAYS AND 2 CAPSULES DAILY FOR THE REST OF THE WEEK. MAY TAKE WITH FOOD TO MINIMIZE GI SIDE EFFECTS 180 capsule 1   isosorbide  mononitrate (IMDUR ) 120 MG 24 hr tablet Take 120 mg by mouth daily.      nitroGLYCERIN  (NITROSTAT ) 0.4 MG SL tablet Place 0.4 mg under the tongue every 5 (five) minutes as needed for chest pain.     predniSONE  (DELTASONE ) 5 MG tablet Take 7.5 mg by mouth every morning.     ranolazine  (RANEXA ) 1000 MG SR tablet Take 1,000 mg by mouth 2 (two) times daily.     valsartan  (DIOVAN ) 160 MG tablet TAKE 1 TABLET(160 MG) BY MOUTH  DAILY 90 tablet 4   Vitamin D, Ergocalciferol, (DRISDOL) 1.25 MG (50000 UNIT) CAPS capsule Take 50,000 Units by mouth.     No current facility-administered medications for this visit.     PHYSICAL EXAMINATION: ECOG PERFORMANCE STATUS: 1 - Symptomatic but completely ambulatory Today's Vitals   08/01/23 1058 08/01/23 1110  BP: (!) 180/88 (!) 174/81  Pulse: 63   Resp: 18   Temp: (!) 97.4 F (36.3 C)   TempSrc: Tympanic   SpO2: 99%   Weight: 211 lb 4.8 oz (95.8 kg)   PainSc: 7    PainLoc: Back    Body mass index is 28.66 kg/m.  SABRA Physical Exam Constitutional:      General: He is not in acute distress. HENT:     Head: Normocephalic and atraumatic.   Eyes:     General: No scleral icterus.   Cardiovascular:     Rate and Rhythm: Normal rate and regular rhythm.  Pulmonary:     Effort: Pulmonary effort is normal. No respiratory distress.  Abdominal:     General: Bowel sounds are normal.     Palpations: Abdomen is soft.   Musculoskeletal:        General: No deformity. Normal range of motion.     Cervical back: Normal range of motion and neck supple.   Skin:    General: Skin is warm and dry.   Neurological:     Mental Status: He is alert and oriented to person, place, and time. Mental status is at baseline.   Psychiatric:        Mood and Affect: Mood normal.    LABORATORY DATA:  I have reviewed the data as listed  Latest Ref Rng & Units 07/27/2023   10:54 AM 04/24/2023   11:00 AM 01/26/2023   10:03 AM  CBC  WBC 4.0 - 10.5 K/uL 8.3  10.2  8.9   Hemoglobin 13.0 - 17.0 g/dL 86.4  85.5  85.3   Hematocrit 39.0 - 52.0 % 40.9  44.0  43.5   Platelets 150 - 400 K/uL 90  145  128       Latest Ref Rng & Units 07/27/2023   10:55 AM 06/06/2023    8:13 AM 04/24/2023   11:00 AM  CMP  Glucose 70 - 99 mg/dL 93  88  85   BUN 8 - 23 mg/dL 14  9  10    Creatinine 0.61 - 1.24 mg/dL 9.06  9.04  8.82   Sodium 135 - 145 mmol/L 135  135  127   Potassium 3.5 - 5.1 mmol/L 4.1   4.2  4.2   Chloride 98 - 111 mmol/L 101  98  95   CO2 22 - 32 mmol/L 27  24  27    Calcium 8.9 - 10.3 mg/dL 8.2  8.6  8.1   Total Protein 6.5 - 8.1 g/dL 5.7  5.5  6.0   Total Bilirubin 0.0 - 1.2 mg/dL 1.5  1.0  1.3   Alkaline Phos 38 - 126 U/L 27  41  31   AST 15 - 41 U/L 17  13  14    ALT 0 - 44 U/L 10  9  11       Iron/TIBC/Ferritin/ %Sat    Component Value Date/Time   IRON 49 09/20/2019 1201   TIBC 318 09/20/2019 1201   FERRITIN 24 09/20/2019 1201   IRONPCTSAT 15 (L) 09/20/2019 1201      RADIOGRAPHIC STUDIES: I have personally reviewed the radiological images as listed and agreed with the findings in the report. CT CHEST ABDOMEN PELVIS W CONTRAST Result Date: 05/07/2023 CLINICAL DATA:  Unintentional weight loss, former smoker, family history of pancreatic cancer * Tracking Code: BO * EXAM: CT CHEST, ABDOMEN, AND PELVIS WITH CONTRAST TECHNIQUE: Multidetector CT imaging of the chest, abdomen and pelvis was performed following the standard protocol during bolus administration of intravenous contrast. RADIATION DOSE REDUCTION: This exam was performed according to the departmental dose-optimization program which includes automated exposure control, adjustment of the mA and/or kV according to patient size and/or use of iterative reconstruction technique. CONTRAST:  OMNIPAQUE  IOHEXOL  300 MG/ML  SOLN COMPARISON:  CT chest angiogram, 09/16/2019 04/03/2019 FINDINGS: CT CHEST FINDINGS Cardiovascular: Aortic atherosclerosis. Normal heart size. Extensive three-vessel coronary artery calcifications and or stents. No pericardial effusion. Mediastinum/Nodes: No enlarged mediastinal, hilar, or axillary lymph nodes. Thyroid  gland, trachea, and esophagus demonstrate no significant findings. Lungs/Pleura: Irregular, bandlike scarring throughout the lungs, generally most concentrated in the lung bases with some evidence of arcing, bandlike elements and subpleural sparing, consistent with chronic residua of  airspace disease seen acutely on examination dated 04/03/2019 and in keeping with chronic sequelae of COVID airspace disease. No pleural effusion or pneumothorax. Musculoskeletal: No chest wall abnormality. No acute osseous findings. CT ABDOMEN PELVIS FINDINGS Hepatobiliary: No focal liver abnormality is seen. Status post cholecystectomy. No biliary dilatation. Pancreas: Unremarkable. No pancreatic ductal dilatation or surrounding inflammatory changes. Spleen: Normal in size without significant abnormality. Adrenals/Urinary Tract: Adrenal glands are unremarkable. Kidneys are normal, without renal calculi, solid lesion, or hydronephrosis. Mild thickening of the urinary bladder wall (series 6, image 112). Stomach/Bowel: Status post distal gastrectomy and gastrojejunostomy. Probable partial right hemicolectomy. No  evidence of bowel wall thickening, distention, or inflammatory changes. Moderate burden of stool throughout the colon with large stool balls in the rectum. Vascular/Lymphatic: Severe aortic atherosclerosis. Prominent aortocaval lymph nodes measuring up 2 1.3 x 1.0 cm (series 2, image 74). Reproductive: No mass or other abnormality. Other: No abdominal wall hernia or abnormality. No ascites. Musculoskeletal: No acute osseous findings. IMPRESSION: 1. No definite CT findings of the chest, abdomen, or pelvis to explain weight loss. Specifically, no evidence of pancreatic mass or other abnormality. 2. Chronic residua of COVID airspace disease seen acutely on examination dated 04/03/2019. 3. Prominent aortocaval lymph nodes measuring up to 1.3 x 1.0 cm, nonspecific, most likely reactive in the absence of other evidence of malignancy. Consider follow-up in 3-6 months to ensure stability if indicated by ongoing clinical concern for malignancy. 4. Status post distal gastrectomy and gastrojejunostomy. Probable partial right hemicolectomy. 5. Moderate burden of stool throughout the colon with large stool balls in the  rectum. 6. Coronary artery disease. Aortic Atherosclerosis (ICD10-I70.0). Electronically Signed   By: Marolyn JONETTA Jaksch M.D.   On: 05/07/2023 17:26

## 2023-08-01 NOTE — Assessment & Plan Note (Addendum)
 JAK2 V617F mutation. Labs reviewed and discussed with patient. Stable counts Due to thrombocytopenia, hold off hydroxyurea  500mg  on Sundays, and 1000 mg daily for the rest of the week. Repeat CBC in 2 weeks.

## 2023-08-02 ENCOUNTER — Inpatient Hospital Stay

## 2023-08-03 ENCOUNTER — Inpatient Hospital Stay

## 2023-08-03 DIAGNOSIS — D45 Polycythemia vera: Secondary | ICD-10-CM | POA: Diagnosis not present

## 2023-08-03 DIAGNOSIS — D751 Secondary polycythemia: Secondary | ICD-10-CM

## 2023-08-03 MED ORDER — CYANOCOBALAMIN 1000 MCG/ML IJ SOLN
1000.0000 ug | Freq: Once | INTRAMUSCULAR | Status: AC
  Administered 2023-08-03: 1000 ug via INTRAMUSCULAR
  Filled 2023-08-03: qty 1

## 2023-08-04 ENCOUNTER — Inpatient Hospital Stay

## 2023-08-04 DIAGNOSIS — D45 Polycythemia vera: Secondary | ICD-10-CM | POA: Diagnosis not present

## 2023-08-04 DIAGNOSIS — D751 Secondary polycythemia: Secondary | ICD-10-CM

## 2023-08-04 MED ORDER — CYANOCOBALAMIN 1000 MCG/ML IJ SOLN
1000.0000 ug | Freq: Once | INTRAMUSCULAR | Status: AC
  Administered 2023-08-04: 1000 ug via INTRAMUSCULAR
  Filled 2023-08-04: qty 1

## 2023-08-08 ENCOUNTER — Inpatient Hospital Stay

## 2023-08-08 ENCOUNTER — Inpatient Hospital Stay: Attending: Oncology

## 2023-08-08 DIAGNOSIS — E538 Deficiency of other specified B group vitamins: Secondary | ICD-10-CM | POA: Insufficient documentation

## 2023-08-08 DIAGNOSIS — D45 Polycythemia vera: Secondary | ICD-10-CM | POA: Diagnosis present

## 2023-08-08 DIAGNOSIS — D751 Secondary polycythemia: Secondary | ICD-10-CM

## 2023-08-08 LAB — TECHNOLOGIST SMEAR REVIEW: Plt Morphology: ADEQUATE

## 2023-08-08 LAB — CBC WITH DIFFERENTIAL (CANCER CENTER ONLY)
Abs Immature Granulocytes: 0.19 10*3/uL — ABNORMAL HIGH (ref 0.00–0.07)
Basophils Absolute: 0.1 10*3/uL (ref 0.0–0.1)
Basophils Relative: 1 %
Eosinophils Absolute: 0.1 10*3/uL (ref 0.0–0.5)
Eosinophils Relative: 0 %
HCT: 40.9 % (ref 39.0–52.0)
Hemoglobin: 13.4 g/dL (ref 13.0–17.0)
Immature Granulocytes: 2 %
Lymphocytes Relative: 25 %
Lymphs Abs: 2.9 10*3/uL (ref 0.7–4.0)
MCH: 36.6 pg — ABNORMAL HIGH (ref 26.0–34.0)
MCHC: 32.8 g/dL (ref 30.0–36.0)
MCV: 111.7 fL — ABNORMAL HIGH (ref 80.0–100.0)
Monocytes Absolute: 1.7 10*3/uL — ABNORMAL HIGH (ref 0.1–1.0)
Monocytes Relative: 15 %
Neutro Abs: 6.7 10*3/uL (ref 1.7–7.7)
Neutrophils Relative %: 57 %
Platelet Count: 180 10*3/uL (ref 150–400)
RBC: 3.66 MIL/uL — ABNORMAL LOW (ref 4.22–5.81)
RDW: 14.5 % (ref 11.5–15.5)
WBC Count: 11.7 10*3/uL — ABNORMAL HIGH (ref 4.0–10.5)
nRBC: 0 % (ref 0.0–0.2)

## 2023-08-08 LAB — IMMATURE PLATELET FRACTION: Immature Platelet Fraction: 3.8 % (ref 1.2–8.6)

## 2023-08-08 MED ORDER — CYANOCOBALAMIN 1000 MCG/ML IJ SOLN
1000.0000 ug | Freq: Once | INTRAMUSCULAR | Status: AC
  Administered 2023-08-08: 1000 ug via INTRAMUSCULAR
  Filled 2023-08-08: qty 1

## 2023-08-15 ENCOUNTER — Inpatient Hospital Stay

## 2023-08-15 DIAGNOSIS — E538 Deficiency of other specified B group vitamins: Secondary | ICD-10-CM | POA: Diagnosis not present

## 2023-08-15 DIAGNOSIS — D751 Secondary polycythemia: Secondary | ICD-10-CM

## 2023-08-15 MED ORDER — CYANOCOBALAMIN 1000 MCG/ML IJ SOLN
1000.0000 ug | Freq: Once | INTRAMUSCULAR | Status: AC
  Administered 2023-08-15: 1000 ug via INTRAMUSCULAR
  Filled 2023-08-15: qty 1

## 2023-08-22 ENCOUNTER — Inpatient Hospital Stay

## 2023-08-22 DIAGNOSIS — D751 Secondary polycythemia: Secondary | ICD-10-CM

## 2023-08-22 DIAGNOSIS — E538 Deficiency of other specified B group vitamins: Secondary | ICD-10-CM | POA: Diagnosis not present

## 2023-08-22 MED ORDER — CYANOCOBALAMIN 1000 MCG/ML IJ SOLN
1000.0000 ug | Freq: Once | INTRAMUSCULAR | Status: AC
  Administered 2023-08-22: 1000 ug via INTRAMUSCULAR
  Filled 2023-08-22: qty 1

## 2023-08-29 ENCOUNTER — Inpatient Hospital Stay: Admitting: Oncology

## 2023-08-29 ENCOUNTER — Inpatient Hospital Stay

## 2023-08-29 ENCOUNTER — Other Ambulatory Visit: Payer: Self-pay

## 2023-08-29 ENCOUNTER — Encounter: Payer: Self-pay | Admitting: Oncology

## 2023-08-29 VITALS — BP 134/67 | HR 65 | Temp 97.1°F | Resp 18 | Wt 203.0 lb

## 2023-08-29 DIAGNOSIS — D72820 Lymphocytosis (symptomatic): Secondary | ICD-10-CM | POA: Diagnosis not present

## 2023-08-29 DIAGNOSIS — E538 Deficiency of other specified B group vitamins: Secondary | ICD-10-CM | POA: Diagnosis not present

## 2023-08-29 DIAGNOSIS — D45 Polycythemia vera: Secondary | ICD-10-CM

## 2023-08-29 DIAGNOSIS — R634 Abnormal weight loss: Secondary | ICD-10-CM

## 2023-08-29 DIAGNOSIS — E2749 Other adrenocortical insufficiency: Secondary | ICD-10-CM | POA: Insufficient documentation

## 2023-08-29 DIAGNOSIS — Z86711 Personal history of pulmonary embolism: Secondary | ICD-10-CM

## 2023-08-29 DIAGNOSIS — R0602 Shortness of breath: Secondary | ICD-10-CM | POA: Insufficient documentation

## 2023-08-29 DIAGNOSIS — D751 Secondary polycythemia: Secondary | ICD-10-CM

## 2023-08-29 LAB — CBC WITH DIFFERENTIAL (CANCER CENTER ONLY)
Abs Immature Granulocytes: 0.13 K/uL — ABNORMAL HIGH (ref 0.00–0.07)
Basophils Absolute: 0.1 K/uL (ref 0.0–0.1)
Basophils Relative: 1 %
Eosinophils Absolute: 0.1 K/uL (ref 0.0–0.5)
Eosinophils Relative: 1 %
HCT: 44 % (ref 39.0–52.0)
Hemoglobin: 14.2 g/dL (ref 13.0–17.0)
Immature Granulocytes: 1 %
Lymphocytes Relative: 26 %
Lymphs Abs: 3.2 K/uL (ref 0.7–4.0)
MCH: 33.7 pg (ref 26.0–34.0)
MCHC: 32.3 g/dL (ref 30.0–36.0)
MCV: 104.5 fL — ABNORMAL HIGH (ref 80.0–100.0)
Monocytes Absolute: 1.1 K/uL — ABNORMAL HIGH (ref 0.1–1.0)
Monocytes Relative: 9 %
Neutro Abs: 7.9 K/uL — ABNORMAL HIGH (ref 1.7–7.7)
Neutrophils Relative %: 62 %
Platelet Count: 219 K/uL (ref 150–400)
RBC: 4.21 MIL/uL — ABNORMAL LOW (ref 4.22–5.81)
RDW: 15.6 % — ABNORMAL HIGH (ref 11.5–15.5)
WBC Count: 12.5 K/uL — ABNORMAL HIGH (ref 4.0–10.5)
nRBC: 0 % (ref 0.0–0.2)

## 2023-08-29 LAB — IMMATURE PLATELET FRACTION: Immature Platelet Fraction: 3.9 % (ref 1.2–8.6)

## 2023-08-29 MED ORDER — HYDROXYUREA 500 MG PO CAPS
500.0000 mg | ORAL_CAPSULE | ORAL | 1 refills | Status: DC
Start: 2023-08-29 — End: 2023-08-29

## 2023-08-29 MED ORDER — CYANOCOBALAMIN 1000 MCG/ML IJ SOLN
1000.0000 ug | Freq: Once | INTRAMUSCULAR | Status: AC
  Administered 2023-08-29: 1000 ug via INTRAMUSCULAR
  Filled 2023-08-29: qty 1

## 2023-08-29 MED ORDER — APIXABAN 2.5 MG PO TABS: 2.5000 mg | ORAL_TABLET | Freq: Two times a day (BID) | ORAL | 3 refills | Status: AC

## 2023-08-29 MED ORDER — HYDROXYUREA 500 MG PO CAPS: 500.0000 mg | ORAL_CAPSULE | ORAL | 1 refills | Status: DC

## 2023-08-29 NOTE — Assessment & Plan Note (Addendum)
 Check PTH He has been on calcium supplementation.

## 2023-08-29 NOTE — Assessment & Plan Note (Addendum)
 JAK2 V617F mutation. Labs reviewed and discussed with patient. Stable counts Resume hydroxyurea , decrease regimen to  500mg  on Wednesdays and Sundays, and 1000 mg daily for the rest of the week. Repeat CBC in 4-6 weeks.

## 2023-08-29 NOTE — Progress Notes (Signed)
 Patient was recently taken off of his BP medication. He is still having the dizziness, the hip pain and the shortness of breath with a little bit of chest pain. His appetite is still good but continues to lose weight. He sees his cardiologist next week.

## 2023-08-29 NOTE — Assessment & Plan Note (Signed)
 He weighed 226 Ib in Oct 2021, 222 Ib in March 2023, 213 Ib in  Dec 2023, March 2024 and now 206-208 Ib in March 2025.  CT abdomen pelvis w contrast did not have finding to explain weight loss.  Suspect weight loss to be due to his history of secondary adrenal insufficiency

## 2023-08-29 NOTE — Assessment & Plan Note (Signed)
 Resume eliquis  2.5mg  BID.  -Patient also takes  plavix  75 mg for CAD.

## 2023-08-29 NOTE — Assessment & Plan Note (Signed)
 Unknown etiology.  He declined CTA chest, also declined work up. He plans to see his cardiololgy

## 2023-08-29 NOTE — Progress Notes (Signed)
 Hematology/Oncology Progress note [phone] [phone]     CHIEF COMPLAINTS/REASON FOR VISIT:  Follow-up for polycythemia vera-Jak [id number] mutation  ASSESSMENT & PLAN:   Polycythemia vera (HCC) JAK2 V617F mutation. Labs reviewed and discussed with patient. Stable counts Resume hydroxyurea , decrease regimen to  500mg  on Wednesdays and Sundays, and 1000 mg daily for the rest of the week. Repeat CBC in 4-6 weeks.  B12 deficiency Recommend B12 1000 mcg  monthly .  History of pulmonary embolism Resume eliquis  2.5mg  BID.  -Patient also takes  plavix  75 mg for CAD.    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  Continue observation   Unintentional weight loss He weighed 226 Ib in Oct 2021, 222 Ib in March 2023, 213 Ib in  Dec 2023, March 2024 and now 206-208 Ib in March 2025.  CT abdomen pelvis w contrast did not have finding to explain weight loss.  Suspect weight loss to be due to his history of secondary adrenal insufficiency    Secondary adrenal insufficiency (HCC) Follow up with endocrinology  SOB (shortness of breath) Unknown etiology.  He declined CTA chest, also declined work up. He plans to see his cardiololgy  Hypocalcemia Check PTH He has been on calcium supplementation.   Orders Placed This Encounter  Procedures   CMP (Cancer Center only)    Standing Status:   Future    Expected Date:   11/29/2023    Expiration Date:   02/27/2024   CBC with Differential (Cancer Center Only)    Standing Status:   Future    Expected Date:   11/29/2023    Expiration Date:   02/27/2024   CBC with Differential (Cancer Center Only)    Standing Status:   Future    Expected Date:   09/26/2023    Expiration Date:   12/25/2023   Parathyroid  hormone, intact (no Ca)    Standing Status:   Future    Number of Occurrences:   1    Expected Date:   08/29/2023    Expiration Date:    11/27/2023   Follow up per LOS   All questions were answered. The patient knows to call the clinic with any problems, questions or concerns.  Edward Cap, MD, PhD [facility] 08/29/2023      HISTORY OF PRESENTING ILLNESS:   Edward Mejia is a  78 y.o.  male with PMH including rheumatoid arthritis, peripheral vascular disease hypertension, Covid infection history,, was seen in consultation at the request of  Edward [facility]ia, Edward BRAVO, MD  for evaluation of polycytosis Patient was accompanied by her wife who participated in providing medical history. February 2021, patient was hospitalized due to COVID-19 pneumonia, respiratory failure, treated with steroids, remdesivir , received bamlanivimab .  Hospitalization was also completed by [facility], bleeding was  thought to be secondary to hemorrhoids.  He was discharged on 04/07/2019 and presented back to emergency room on 04/12/2019 due to left lower extremity swelling, acute hypoxic respiratory failure.  Work-up showed acute bilateral pulmonary embolism, large left lower extremity DVT, thrombosis and IVC and left external iliac vein.  Patient was started on anticoagulation and transition to Eliquis  Patient has been on Eliquis  since March 2021.  Patient also takes aspirin .  Patient has history of CAD, follows up with Dr. Bosie.  Rheumatoid arthritis on Enbrel once a week.  Also on prednisone  7.5 mg daily. Patient has also been found to have erythrocytosis. 09/20/2019, CBC showed hemoglobin of 17.6,  reviewed previous medical records, erythrocytosis can be traced back to at least 2017.  Patient also has leukocytosis since March 2021.  Leukocytosis has trended down recently and CBC on 09/20/2019 showed a white count of 10.8, predominantly monocytosis.  Patient is a former smoker, 10-12-pack-year smoking history and he stopped smoking in 1984. Currently he has some chronic shortness of breath with exertion.  He has lost weight during his previous  hospitalization and has gained weight since discharge.  Sometimes he feels sweaty.  Patient also reports intermittent dizziness/lightheaded episodes for the past couple of months.  He actually had an episode during today's visit and is spontaneously resolved after few minutes.  Denies any shortness of breath, chest pain, vision changes, focal deficits.  Patient has been referred to establish care with neurology for further evaluation.  #  psoriatic arthritis follows up with [facility] Dr. Selma.on chronic prednisone .  # skin squamous cell carcinoma removal from his scalp. #09/27/2021 He recently underwent cardiac cath for abnormal stress test and unstable angina s/p drug eluting coronary stent placement.   INTERVAL HISTORY Edward Mejia is a 78 y.o. male who has above history reviewed by me today presents for follow up visit for management of polycythemia vera Patient off Eliquis  2.5 mg twice daily.  Off Hydroxyurea  SOB, intermittent chest pain.  Patient reports dizziness and lightheaded, balance issue.  He had a fall from deck, had soft tissue contusion on arm and scalp. Patient has experienced weight loss  Secondary adrenal insufficiency, he follows up with endocrinology.  He was recommended to start fludrocortisone, however, his BP increases.   .Review of Systems  Constitutional:  Positive for fatigue. Negative for appetite change, chills, fever and unexpected weight change.  HENT:   Negative for hearing loss and voice change.   Eyes:  Negative for eye problems and icterus.  Respiratory:  Positive for shortness of breath. Negative for chest tightness and cough.   Cardiovascular:  Positive for chest pain. Negative for leg swelling.  Gastrointestinal:  Negative for abdominal distention, abdominal pain and blood in stool.  Endocrine: Negative for hot flashes.  Genitourinary:  Negative for difficulty urinating, dysuria and frequency.   Musculoskeletal:  Positive for gait problem.  Negative for arthralgias.  Skin:  Negative for itching and rash.  Neurological:  Positive for dizziness and gait problem. Negative for extremity weakness, light-headedness and numbness.       Balance issue  Hematological:  Negative for adenopathy. Bruises/bleeds easily.  Psychiatric/Behavioral:  Negative for confusion.     MEDICAL HISTORY:  Past Medical History:  Diagnosis Date   Arthritis    Bilateral pulmonary embolism (HCC) 03/2019   in setting of Covid-19 pneumonia   Cancer (HCC)    skin cancer   Cellulitis    Cellulitis    DVT (deep venous thrombosis) (HCC) 03/2019   in setting of Covid-19 pneumonia   Erythrocytosis 09/20/2019   H/O adenomatous polyp of colon 03/24/2015   HCAP (healthcare-associated pneumonia) 04/12/2019   History of brain disorder: history of amaurosis fugax 03/24/2015   History of pneumonia 04/04/2008   History of rheumatic fever 03/24/2015   DID Have Rheumatic Fever.     Hypertension    Left leg DVT (HCC) 04/12/2019   In setting of Covid-19 started anticoagulation 04-12-2019. Subsequently Dx PCV and long term NOAC per hematology    Peptic ulcer disease    Sepsis (HCC) 04/12/2019   secondary to Covid pneumonia    SURGICAL HISTORY: Past Surgical History:  Procedure Laterality  Date   APPENDECTOMY     CORONARY STENT INTERVENTION N/A 01/24/2023   Procedure: CORONARY STENT INTERVENTION;  Surgeon: Ammon Blunt, MD;  Location: [facility] INVASIVE CV [facility];  Service: Cardiovascular;  Laterality: N/A;   LEFT HEART CATH AND CORONARY ANGIOGRAPHY Left 02/06/2018   Procedure: LEFT HEART CATH AND CORONARY ANGIOGRAPHY;  Surgeon: Edward Mejia Vinie LABOR, MD;  Location: [facility] INVASIVE CV LAB;  Service: Cardiovascular;  Laterality: Left;   LEFT HEART CATH AND CORONARY ANGIOGRAPHY Left 01/24/2023   Procedure: LEFT HEART CATH AND CORONARY ANGIOGRAPHY;  Surgeon: Ammon Blunt, MD;  Location: [facility] INVASIVE CV [facility];  Service: Cardiovascular;  Laterality: Left;   MANDIBLE FRACTURE  SURGERY     fractured jaw   PARTIAL GASTRECTOMY     peptic ulcer disease    SOCIAL HISTORY: Social History   Socioeconomic History   Marital status: Married    Spouse name: Particia    Number of children: 4   Years of education: Not on file   Highest education level: 8th grade  Occupational History   Occupation: Disabled    Comment: due to Psoriatic Arthritis  Tobacco Use   Smoking status: Former    Current packs/day: 0.00    Types: Cigarettes    Quit date: 09/20/1982    Years since quitting: 40.9   Smokeless tobacco: Never   Tobacco comments:    quit over 40+ years ago  Vaping Use   Vaping status: Never Used  Substance and Sexual Activity   Alcohol use: No    Alcohol/week: 0.0 standard drinks of alcohol   Drug use: No   Sexual activity: Not on file  Other Topics Concern   Not on file  Social History Narrative   Lives with [place], wife.  No indoor pets.    Social Drivers of Corporate investment banker Strain: Low Risk  (07/11/2023)   [facility]   Overall Financial Resource Strain (CARDIA)    Difficulty of Paying Living Expenses: Not very hard  Food Insecurity: No Food Insecurity (07/11/2023)   Received from [facility]   Hunger Vital Sign    Within the past 12 months, you worried that your food would run out before you got the money to buy more.: Never true    Within the past 12 months, the food you bought just didn't last and you didn't have money to get more.: Never true  Transportation Needs: No Transportation Needs (07/11/2023)   Received from [facility] - Transportation    In the past 12 months, has lack of transportation kept you from medical appointments or from getting medications?: No    Lack of Transportation (Non-Medical): No  Physical Activity: Unknown (01/08/2019)   Exercise Vital Sign    Days of Exercise per Week: Not on file    Minutes of Exercise per Session: 0 min  Stress:  No Stress Concern Present (01/08/2019)   [employer] [facility] [employer] [facility] - Occupational Stress Questionnaire    Feeling of Stress : Not at all  Social Connections: Unknown (06/19/2021)   Received from [facility]   Social Network    Social Network: Not on file  Intimate Partner Violence: Not At Risk (01/24/2023)   Humiliation, Afraid, Rape, and Kick questionnaire    Fear of Current or Ex-Partner: No    Emotionally Abused: No    Physically Abused: No    Sexually Abused: No    FAMILY HISTORY: Family History  Problem Relation Age of Onset   Cancer Mother        lung cancer   Heart disease Father 41       heart attack   Cancer Brother        pancreatic cancer   Cancer Brother        pancreatic cancer    ALLERGIES:  is allergic to pravachol [pravastatin].  MEDICATIONS:  Current Outpatient Medications  Medication Sig Dispense Refill   alendronate (FOSAMAX) 70 MG tablet Take 70 mg by mouth once a week.     clopidogrel  (PLAVIX ) 75 MG tablet Take 1 tablet (75 mg total) by mouth daily with breakfast. 30 tablet 0   etanercept (ENBREL) 50 MG/ML injection Inject 50 mg into the skin every Thursday.      gabapentin  (NEURONTIN ) 300 MG capsule Take 900 mg by mouth See admin instructions. At 4 pm     HYDROcodone -acetaminophen  (NORCO) 10-325 MG tablet Take 1 tablet by mouth every 6 (six) hours as needed for moderate pain (pain score 4-6).     hydrocortisone  2.5 % cream APPLY TOPICALLY TO THE AFFECTED AREA TWICE DAILY AS NEEDED 30 g 3   isosorbide  mononitrate (IMDUR ) 120 MG 24 hr tablet Take 120 mg by mouth daily.      nitroGLYCERIN  (NITROSTAT ) 0.4 MG SL tablet Place 0.4 mg under the tongue every 5 (five) minutes as needed for chest pain.     predniSONE  (DELTASONE ) 5 MG tablet Take 7.5 mg by mouth every morning.     ranolazine  (RANEXA ) 1000 MG SR tablet Take 1,000 mg by mouth 2 (two) times daily.     Vitamin D, Ergocalciferol, (DRISDOL) 1.25 MG (50000 UNIT) CAPS capsule Take 50,000  Units by mouth.     apixaban  (ELIQUIS ) 2.5 MG TABS tablet Take 1 tablet (2.5 mg total) by mouth 2 (two) times daily. 180 tablet 3   fludrocortisone (FLORINEF) 0.1 MG tablet Take by mouth. (Patient not taking: Reported on 08/29/2023)     hydroxyurea  (HYDREA ) 500 MG capsule Take 1 capsule (500 mg total) by mouth See admin instructions. May take with food to minimize GI side effects.TAKE 1 CAPSULE BY MOUTH ON WEDNESDAYS, SUNDAYS AND 2 CAPSULES DAILY FOR THE REST OF THE WEEK. MAY TAKE WITH FOOD TO MINIMIZE GI SIDE EFFECTS 180 capsule 1   valsartan  (DIOVAN ) 160 MG tablet TAKE 1 TABLET(160 MG) BY MOUTH DAILY (Patient not taking: Reported on 08/29/2023) 90 tablet 4   No current facility-administered medications for this visit.     PHYSICAL EXAMINATION: ECOG PERFORMANCE STATUS: 1 - Symptomatic but completely ambulatory Today's Vitals   08/29/23 1352  BP: 134/67  Pulse: 65  Resp: 18  Temp: (!) 97.1 F (36.2 C)  TempSrc: Tympanic  SpO2: 99%  Weight: 203 lb (92.1 kg)  PainSc: 2   PainLoc: Hip   Body mass index is 27.53 kg/m.  SABRA Physical Exam Constitutional:      General: He is not in acute distress. HENT:     Head: Normocephalic and atraumatic.  Eyes:     General: No scleral icterus. Cardiovascular:     Rate and Rhythm: Normal rate and regular rhythm.  Pulmonary:     Effort: Pulmonary effort is normal. No respiratory distress.  Abdominal:     General: Bowel sounds are normal.     Palpations: Abdomen is soft.  Musculoskeletal:        General: No deformity. Normal range of motion.     Cervical back: Normal range of motion  and neck supple.  Skin:    General: Skin is warm and dry.  Neurological:     Mental Status: He is alert and oriented to person, place, and time. Mental status is at baseline.  Psychiatric:        Mood and Affect: Mood normal.     LABORATORY DATA:  I have reviewed the data as listed    Latest Ref Rng & Units 08/29/2023    1:26 PM 08/08/2023   10:20 AM  07/27/2023   10:54 AM  CBC  WBC 4.0 - 10.5 K/uL 12.5  11.7  8.3   Hemoglobin 13.0 - 17.0 g/dL 85.7  86.5  86.4   Hematocrit 39.0 - 52.0 % 44.0  40.9  40.9   Platelets 150 - 400 K/uL 219  180  90       Latest Ref Rng & Units 07/27/2023   10:55 AM 06/06/2023    8:13 AM 04/24/2023   11:00 AM  CMP  Glucose 70 - 99 mg/dL 93  88  85   BUN 8 - 23 mg/dL 14  9  10    Creatinine 0.61 - 1.24 mg/dL 9.06  9.04  8.82   Sodium 135 - 145 mmol/L 135  135  127   Potassium 3.5 - 5.1 mmol/L 4.1  4.2  4.2   Chloride 98 - 111 mmol/L 101  98  95   CO2 22 - 32 mmol/L 27  24  27    Calcium 8.9 - 10.3 mg/dL 8.2  8.6  8.1   Total Protein 6.5 - 8.1 g/dL 5.7  5.5  6.0   Total Bilirubin 0.0 - 1.2 mg/dL 1.5  1.0  1.3   Alkaline Phos 38 - 126 U/L 27  41  31   AST 15 - 41 U/L 17  13  14    ALT 0 - 44 U/L 10  9  11       Iron/TIBC/Ferritin/ %Sat    Component Value Date/Time   IRON 49 09/20/2019 1201   TIBC 318 09/20/2019 1201   FERRITIN 24 09/20/2019 1201   IRONPCTSAT 15 (L) 09/20/2019 1201      RADIOGRAPHIC STUDIES: I have personally reviewed the radiological images as listed and agreed with the findings in the report. No results found.

## 2023-08-29 NOTE — Assessment & Plan Note (Signed)
 Follow up with endocrinology

## 2023-08-29 NOTE — Assessment & Plan Note (Signed)
 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  Continue observation

## 2023-08-29 NOTE — Assessment & Plan Note (Signed)
Recommend B12 1000 mcg monthly.

## 2023-08-30 LAB — PARATHYROID HORMONE, INTACT (NO CA): PTH: 26 pg/mL (ref 15–65)

## 2023-09-01 ENCOUNTER — Encounter: Payer: Self-pay | Admitting: Family Medicine

## 2023-09-01 DIAGNOSIS — C44722 Squamous cell carcinoma of skin of right lower limb, including hip: Secondary | ICD-10-CM | POA: Insufficient documentation

## 2023-09-29 ENCOUNTER — Ambulatory Visit

## 2023-09-29 ENCOUNTER — Inpatient Hospital Stay: Attending: Oncology

## 2023-09-29 DIAGNOSIS — D45 Polycythemia vera: Secondary | ICD-10-CM | POA: Diagnosis present

## 2023-09-29 DIAGNOSIS — E538 Deficiency of other specified B group vitamins: Secondary | ICD-10-CM | POA: Diagnosis present

## 2023-09-29 DIAGNOSIS — D751 Secondary polycythemia: Secondary | ICD-10-CM

## 2023-09-29 LAB — CBC WITH DIFFERENTIAL (CANCER CENTER ONLY)
Abs Immature Granulocytes: 0.06 K/uL (ref 0.00–0.07)
Basophils Absolute: 0.1 K/uL (ref 0.0–0.1)
Basophils Relative: 1 %
Eosinophils Absolute: 0.1 K/uL (ref 0.0–0.5)
Eosinophils Relative: 1 %
HCT: 44.6 % (ref 39.0–52.0)
Hemoglobin: 14 g/dL (ref 13.0–17.0)
Immature Granulocytes: 1 %
Lymphocytes Relative: 39 %
Lymphs Abs: 3.3 K/uL (ref 0.7–4.0)
MCH: 30.9 pg (ref 26.0–34.0)
MCHC: 31.4 g/dL (ref 30.0–36.0)
MCV: 98.5 fL (ref 80.0–100.0)
Monocytes Absolute: 0.6 K/uL (ref 0.1–1.0)
Monocytes Relative: 7 %
Neutro Abs: 4.5 K/uL (ref 1.7–7.7)
Neutrophils Relative %: 51 %
Platelet Count: 111 K/uL — ABNORMAL LOW (ref 150–400)
RBC: 4.53 MIL/uL (ref 4.22–5.81)
RDW: 17.3 % — ABNORMAL HIGH (ref 11.5–15.5)
WBC Count: 8.6 K/uL (ref 4.0–10.5)
nRBC: 0 % (ref 0.0–0.2)

## 2023-09-29 MED ORDER — CYANOCOBALAMIN 1000 MCG/ML IJ SOLN
1000.0000 ug | Freq: Once | INTRAMUSCULAR | Status: AC
  Administered 2023-09-29: 1000 ug via INTRAMUSCULAR
  Filled 2023-09-29: qty 1

## 2023-10-30 ENCOUNTER — Inpatient Hospital Stay: Attending: Oncology

## 2023-10-30 DIAGNOSIS — E538 Deficiency of other specified B group vitamins: Secondary | ICD-10-CM | POA: Diagnosis present

## 2023-10-30 DIAGNOSIS — D751 Secondary polycythemia: Secondary | ICD-10-CM

## 2023-10-30 MED ORDER — CYANOCOBALAMIN 1000 MCG/ML IJ SOLN
1000.0000 ug | Freq: Once | INTRAMUSCULAR | Status: AC
  Administered 2023-10-30: 1000 ug via INTRAMUSCULAR
  Filled 2023-10-30: qty 1

## 2023-11-29 ENCOUNTER — Encounter: Payer: Self-pay | Admitting: Oncology

## 2023-11-29 ENCOUNTER — Inpatient Hospital Stay: Attending: Oncology

## 2023-11-29 ENCOUNTER — Inpatient Hospital Stay: Admitting: Oncology

## 2023-11-29 VITALS — BP 168/77 | HR 68 | Temp 96.7°F | Resp 16 | Wt 201.0 lb

## 2023-11-29 DIAGNOSIS — M069 Rheumatoid arthritis, unspecified: Secondary | ICD-10-CM | POA: Insufficient documentation

## 2023-11-29 DIAGNOSIS — Z86711 Personal history of pulmonary embolism: Secondary | ICD-10-CM

## 2023-11-29 DIAGNOSIS — Z79899 Other long term (current) drug therapy: Secondary | ICD-10-CM | POA: Diagnosis not present

## 2023-11-29 DIAGNOSIS — E2740 Unspecified adrenocortical insufficiency: Secondary | ICD-10-CM | POA: Diagnosis not present

## 2023-11-29 DIAGNOSIS — D45 Polycythemia vera: Secondary | ICD-10-CM

## 2023-11-29 DIAGNOSIS — Z7901 Long term (current) use of anticoagulants: Secondary | ICD-10-CM | POA: Insufficient documentation

## 2023-11-29 DIAGNOSIS — R634 Abnormal weight loss: Secondary | ICD-10-CM | POA: Insufficient documentation

## 2023-11-29 DIAGNOSIS — Z800 Family history of malignant neoplasm of digestive organs: Secondary | ICD-10-CM | POA: Insufficient documentation

## 2023-11-29 DIAGNOSIS — D72820 Lymphocytosis (symptomatic): Secondary | ICD-10-CM | POA: Insufficient documentation

## 2023-11-29 DIAGNOSIS — Z801 Family history of malignant neoplasm of trachea, bronchus and lung: Secondary | ICD-10-CM | POA: Diagnosis not present

## 2023-11-29 DIAGNOSIS — E538 Deficiency of other specified B group vitamins: Secondary | ICD-10-CM

## 2023-11-29 DIAGNOSIS — R0602 Shortness of breath: Secondary | ICD-10-CM | POA: Insufficient documentation

## 2023-11-29 DIAGNOSIS — Z87891 Personal history of nicotine dependence: Secondary | ICD-10-CM | POA: Diagnosis not present

## 2023-11-29 LAB — CBC WITH DIFFERENTIAL (CANCER CENTER ONLY)
Abs Immature Granulocytes: 0.12 K/uL — ABNORMAL HIGH (ref 0.00–0.07)
Basophils Absolute: 0.1 K/uL (ref 0.0–0.1)
Basophils Relative: 1 %
Eosinophils Absolute: 0 K/uL (ref 0.0–0.5)
Eosinophils Relative: 1 %
HCT: 43.9 % (ref 39.0–52.0)
Hemoglobin: 14 g/dL (ref 13.0–17.0)
Immature Granulocytes: 1 %
Lymphocytes Relative: 34 %
Lymphs Abs: 3.1 K/uL (ref 0.7–4.0)
MCH: 31.5 pg (ref 26.0–34.0)
MCHC: 31.9 g/dL (ref 30.0–36.0)
MCV: 98.7 fL (ref 80.0–100.0)
Monocytes Absolute: 0.8 K/uL (ref 0.1–1.0)
Monocytes Relative: 9 %
Neutro Abs: 4.8 K/uL (ref 1.7–7.7)
Neutrophils Relative %: 54 %
Platelet Count: 114 K/uL — ABNORMAL LOW (ref 150–400)
RBC: 4.45 MIL/uL (ref 4.22–5.81)
RDW: 17.5 % — ABNORMAL HIGH (ref 11.5–15.5)
WBC Count: 8.9 K/uL (ref 4.0–10.5)
nRBC: 0 % (ref 0.0–0.2)

## 2023-11-29 LAB — CMP (CANCER CENTER ONLY)
ALT: 9 U/L (ref 0–44)
AST: 14 U/L — ABNORMAL LOW (ref 15–41)
Albumin: 3.6 g/dL (ref 3.5–5.0)
Alkaline Phosphatase: 28 U/L — ABNORMAL LOW (ref 38–126)
Anion gap: 9 (ref 5–15)
BUN: 12 mg/dL (ref 8–23)
CO2: 26 mmol/L (ref 22–32)
Calcium: 8.2 mg/dL — ABNORMAL LOW (ref 8.9–10.3)
Chloride: 98 mmol/L (ref 98–111)
Creatinine: 0.92 mg/dL (ref 0.61–1.24)
GFR, Estimated: >60 mL/min (ref >60)
Glucose, Bld: 97 mg/dL (ref 70–99)
Potassium: 4 mmol/L (ref 3.5–5.1)
Sodium: 133 mmol/L — ABNORMAL LOW (ref 135–145)
Total Bilirubin: 1.1 mg/dL (ref 0.0–1.2)
Total Protein: 5.9 g/dL — ABNORMAL LOW (ref 6.5–8.1)

## 2023-11-29 NOTE — Assessment & Plan Note (Signed)
 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  Continue observation

## 2023-11-29 NOTE — Progress Notes (Signed)
 Hematology/Oncology Progress note [phone] [phone]     CHIEF COMPLAINTS/REASON FOR VISIT:  Follow-up for polycythemia vera-Jak [id number] mutation  ASSESSMENT & PLAN:   Polycythemia vera (HCC) JAK2 V617F mutation. Labs reviewed and discussed with patient. Thrombocytopenia. No splenomegaly on physical examination.  Decrease hydroxyurea  regimen to  500mg  daily 5 days per week, and 1000 mg daily for 2 days per week.  Repeat CBC in 6 weeks.  B12 deficiency Recommend B12 2500mcg sublingual.- otc supply  History of pulmonary embolism  eliquis  2.5mg  BID.  -Patient also takes  plavix  75 mg for CAD.    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  Continue observation   SOB (shortness of breath) Unknown etiology.  Patient previously declined CT angiogram to rule out pulm embolism.   Unintentional weight loss He weighed 226 Ib in Oct 2021, 222 Ib in March 2023, 213 Ib in  Dec 2023, March 2024 and now 206-208 Ib in March 2025.  CT abdomen pelvis w contrast did not have finding to explain weight loss.  Suspect weight loss to be due to his history of secondary adrenal insufficiency     Orders Placed This Encounter  Procedures   CMP (Cancer Center only)    Standing Status:   Future    Expected Date:   02/29/2024    Expiration Date:   05/29/2024   CBC with Differential (Cancer Center Only)    Standing Status:   Future    Expected Date:   02/29/2024    Expiration Date:   05/29/2024   CBC with Differential (Cancer Center Only)    Standing Status:   Future    Expected Date:   01/10/2024    Expiration Date:   04/09/2024   Vitamin B12    Standing Status:   Future    Expected Date:   01/10/2024    Expiration Date:   04/09/2024   Follow up per LOS   All questions were answered. The patient knows to call the clinic with any problems, questions or concerns.  Zelphia Cap, MD, PhD [facility]  Hematology Oncology 11/29/2023      HISTORY OF PRESENTING ILLNESS:   Edward Mejia is a  78 y.o.  male with PMH including rheumatoid arthritis, peripheral vascular disease hypertension, Covid infection history,, was seen in consultation at the request of  Fisher, Nancyann BRAVO, MD  for evaluation of polycytosis Patient was accompanied by her wife who participated in providing medical history. February 2021, patient was hospitalized due to COVID-19 pneumonia, respiratory failure, treated with steroids, remdesivir , received bamlanivimab .  Hospitalization was also completed by [facility], bleeding was  thought to be secondary to hemorrhoids.  He was discharged on 04/07/2019 and presented back to emergency room on 04/12/2019 due to left lower extremity swelling, acute hypoxic respiratory failure.  Work-up showed acute bilateral pulmonary embolism, large left lower extremity DVT, thrombosis and IVC and left external iliac vein.  Patient was started on anticoagulation and transition to Eliquis  Patient has been on Eliquis  since March 2021.  Patient also takes aspirin .  Patient has history of CAD, follows up with Dr. Bosie.  Rheumatoid arthritis on Enbrel once a week.  Also on prednisone  7.5 mg daily. Patient has also been found to have erythrocytosis. 09/20/2019, CBC showed hemoglobin of 17.6, reviewed previous medical records, erythrocytosis can be traced back to at least 2017.  Patient also has leukocytosis since March 2021.  Leukocytosis has trended down recently and CBC on  09/20/2019 showed a white count of 10.8, predominantly monocytosis.  Patient is a former smoker, 10-12-pack-year smoking history and he stopped smoking in 1984. Currently he has some chronic shortness of breath with exertion.  He has lost weight during his previous hospitalization and has gained weight since discharge.  Sometimes he feels sweaty.  Patient also reports intermittent dizziness/lightheaded episodes for the past couple of months.   He actually had an episode during today's visit and is spontaneously resolved after few minutes.  Denies any shortness of breath, chest pain, vision changes, focal deficits.  Patient has been referred to establish care with neurology for further evaluation.  #  psoriatic arthritis follows up with [facility] Dr. Selma.on chronic prednisone .  # skin squamous cell carcinoma removal from his scalp. #09/27/2021 He recently underwent cardiac cath for abnormal stress test and unstable angina s/p drug eluting coronary stent placement.   INTERVAL HISTORY Edward Mejia is a 78 y.o. male who has above history reviewed by me today presents for follow up visit for management of polycythemia vera  Discussed the use of AI scribe software for clinical note transcription with the patient, who gave verbal consent to proceed.   He experiences significant fatigue, sleeping excessively both during the day and night, yet continues to feel very tired. He takes multiple naps during the day, sometimes up to three two-hour naps. He also has difficulty breathing, especially when fans are turned on, which occurs two to four times a day. No shortness of breath at rest, but it occurs when getting up.  He has a history of adrenal insufficiency and is currently taking 7.5 mg of prednisone  daily along with a half tablet of fludrocortisone.  Patient takes steroids for rheumatoid arthritis.  His last cortisol level check in April showed low levels at 0.6. He is scheduled to see his endocrinologist in a week.  He has been experiencing weight loss despite having a good appetite and eating normal portions he eats normal portions.  He underwent surgery seven weeks ago for squamous cell cancer on his leg, which was successfully removed.  He is currently on hydroxyurea , with a recent adjustment to his dosage due to low platelet counts. His current regimen involves taking 500 mg four days a week and 1000 mg on two alternate days.  He  has a history of low B12 levels and has previously received B12 shots.  Due to this patient is declining performance status, wife prefers options he gets supplementation at home.   .Review of Systems  Constitutional:  Positive for fatigue. Negative for appetite change, chills, fever and unexpected weight change.  HENT:   Negative for hearing loss and voice change.   Eyes:  Negative for eye problems and icterus.  Respiratory:  Positive for shortness of breath. Negative for chest tightness and cough.   Cardiovascular:  Positive for chest pain. Negative for leg swelling.  Gastrointestinal:  Negative for abdominal distention, abdominal pain and blood in stool.  Endocrine: Negative for hot flashes.  Genitourinary:  Negative for difficulty urinating, dysuria and frequency.   Musculoskeletal:  Positive for gait problem. Negative for arthralgias.  Skin:  Negative for itching and rash.  Neurological:  Positive for dizziness and gait problem. Negative for extremity weakness, light-headedness and numbness.       Balance issue  Hematological:  Negative for adenopathy. Bruises/bleeds easily.  Psychiatric/Behavioral:  Negative for confusion.     MEDICAL HISTORY:  Past Medical History:  Diagnosis Date   Arthritis  Bilateral pulmonary embolism (HCC) 03/2019   in setting of Covid-19 pneumonia   Cancer [facility])    skin cancer   Cellulitis    Cellulitis    DVT (deep venous thrombosis) (HCC) 03/2019   in setting of Covid-19 pneumonia   Erythrocytosis 09/20/2019   H/O adenomatous polyp of colon 03/24/2015   HCAP (healthcare-associated pneumonia) 04/12/2019   History of brain disorder: history of amaurosis fugax 03/24/2015   History of pneumonia 04/04/2008   History of rheumatic fever 03/24/2015   DID Have Rheumatic Fever.     Hypertension    Left leg DVT (HCC) 04/12/2019   In setting of Covid-19 started anticoagulation 04-12-2019. Subsequently Dx PCV and long term NOAC per hematology    Peptic ulcer disease     Sepsis (HCC) 04/12/2019   secondary to Covid pneumonia    SURGICAL HISTORY: Past Surgical History:  Procedure Laterality Date   APPENDECTOMY     CORONARY STENT INTERVENTION N/A 01/24/2023   Procedure: CORONARY STENT INTERVENTION;  Surgeon: Ammon Blunt, MD;  Location: [facility] INVASIVE CV LAB;  Service: Cardiovascular;  Laterality: N/A;   LEFT HEART CATH AND CORONARY ANGIOGRAPHY Left 02/06/2018   Procedure: LEFT HEART CATH AND CORONARY ANGIOGRAPHY;  Surgeon: Bosie Vinie LABOR, MD;  Location: [facility] INVASIVE CV LAB;  Service: Cardiovascular;  Laterality: Left;   LEFT HEART CATH AND CORONARY ANGIOGRAPHY Left 01/24/2023   Procedure: LEFT HEART CATH AND CORONARY ANGIOGRAPHY;  Surgeon: Ammon Blunt, MD;  Location: [facility] INVASIVE CV LAB;  Service: Cardiovascular;  Laterality: Left;   MANDIBLE FRACTURE SURGERY     fractured jaw   PARTIAL GASTRECTOMY     peptic ulcer disease    SOCIAL HISTORY: Social History   Socioeconomic History   Marital status: Married    Spouse name: Particia    Number of children: 4   Years of education: Not on file   Highest education level: 8th grade  Occupational History   Occupation: Disabled    Comment: due to Psoriatic Arthritis  Tobacco Use   Smoking status: Former    Current packs/day: 0.00    Types: Cigarettes    Quit date: 09/20/1982    Years since quitting: 41.2   Smokeless tobacco: Never   Tobacco comments:    quit over 40+ years ago  Vaping Use   Vaping status: Never Used  Substance and Sexual Activity   Alcohol use: No    Alcohol/week: 0.0 standard drinks of alcohol   Drug use: No   Sexual activity: Not on file  Other Topics Concern   Not on file  Social History Narrative   Lives with [place], wife.  No indoor pets.    Social Drivers of Corporate investment banker Strain: Low Risk  (09/22/2023)   Received from [facility]   Overall Financial Resource Strain (CARDIA)    How hard is it for you to pay for the very basics like  food, housing, medical care, and heating?: Not hard at all  Food Insecurity: No Food Insecurity (09/22/2023)   Received from [facility]   Hunger Vital Sign    Within the past 12 months, you worried that your food would run out before you got the money to buy more.: Never true    Within the past 12 months, the food you bought just didn't last and you didn't have money to get more.: Never true  Transportation Needs: No Transportation Needs (09/22/2023)   Received from [facility] - Transportation  In the past 12 months, has lack of transportation kept you from medical appointments or from getting medications?: No    In the past 12 months, has lack of transportation kept you from meetings, work, or from getting things needed for daily living?: No  Physical Activity: Inactive (09/22/2023)   Received from [facility]   Exercise Vital Sign    On average, how many days per week do you engage in moderate to strenuous exercise (like a brisk walk)?: 0 days    Minutes of Exercise per Session: Not on file  Stress: No Stress Concern Present (09/22/2023)   Received from [facility] - Occupational Stress Questionnaire    Do you feel stress - tense, restless, nervous, or anxious, or unable to sleep at night because your mind is troubled all the time - these days?: Not at all  Social Connections: Moderately Integrated (09/22/2023)   Received from [facility]   Social Network    How would you rate your social network (family, work, friends)?: Adequate participation with social networks  Intimate Partner Violence: Not At Risk (09/22/2023)   Received from [facility]   HITS    Over the last 12 months how often did your partner physically hurt you?: Never    Over the last 12 months how often did your partner insult you or talk down to you?: Never    Over the last 12 months how often did your partner threaten you with physical harm?: Never    Over  the last 12 months how often did your partner scream or curse at you?: Never    FAMILY HISTORY: Family History  Problem Relation Age of Onset   Cancer Mother        lung cancer   Heart disease Father 71       heart attack   Cancer Brother        pancreatic cancer   Cancer Brother        pancreatic cancer    ALLERGIES:  is allergic to pravachol [pravastatin].  MEDICATIONS:  Current Outpatient Medications  Medication Sig Dispense Refill   alendronate (FOSAMAX) 70 MG tablet Take 70 mg by mouth once a week.     amLODipine (NORVASC) 2.5 MG tablet Take 2.5 mg by mouth.     apixaban  (ELIQUIS ) 2.5 MG TABS tablet Take 1 tablet (2.5 mg total) by mouth 2 (two) times daily. 180 tablet 3   clopidogrel  (PLAVIX ) 75 MG tablet Take 1 tablet (75 mg total) by mouth daily with breakfast. 30 tablet 0   etanercept (ENBREL) 50 MG/ML injection Inject 50 mg into the skin every Thursday.      fludrocortisone (FLORINEF) 0.1 MG tablet Take by mouth.     gabapentin  (NEURONTIN ) 300 MG capsule Take 900 mg by mouth See admin instructions. At 4 pm     HYDROcodone -acetaminophen  (NORCO) 10-325 MG tablet Take 1 tablet by mouth every 6 (six) hours as needed for moderate pain (pain score 4-6).     hydrocortisone  2.5 % cream APPLY TOPICALLY TO THE AFFECTED AREA TWICE DAILY AS NEEDED 30 g 3   hydroxyurea  (HYDREA ) 500 MG capsule Take 1 capsule (500 mg total) by mouth See admin instructions. May take with food to minimize GI side effects.TAKE 1 CAPSULE BY MOUTH ON WEDNESDAYS, SUNDAYS AND 2 CAPSULES DAILY FOR THE REST OF THE WEEK. MAY TAKE WITH FOOD TO MINIMIZE GI SIDE EFFECTS 180 capsule 1   isosorbide  mononitrate (IMDUR ) 120  MG 24 hr tablet Take 120 mg by mouth daily.      nitroGLYCERIN  (NITROSTAT ) 0.4 MG SL tablet Place 0.4 mg under the tongue every 5 (five) minutes as needed for chest pain.     predniSONE  (DELTASONE ) 5 MG tablet Take 7.5 mg by mouth every morning.     ranolazine  (RANEXA ) 1000 MG SR tablet Take 1,000 mg  by mouth 2 (two) times daily.     Vitamin D, Ergocalciferol, (DRISDOL) 1.25 MG (50000 UNIT) CAPS capsule Take 50,000 Units by mouth.     valsartan  (DIOVAN ) 160 MG tablet TAKE 1 TABLET(160 MG) BY MOUTH DAILY (Patient not taking: Reported on 11/29/2023) 90 tablet 4   No current facility-administered medications for this visit.     PHYSICAL EXAMINATION: ECOG PERFORMANCE STATUS: 1 - Symptomatic but completely ambulatory Today's Vitals   11/29/23 1323 11/29/23 1329  BP: (!) 165/81 (!) 168/77  Pulse: 68   Resp: 16   Temp: (!) 96.7 F (35.9 C)   TempSrc: Tympanic   SpO2: 100%   Weight: 201 lb (91.2 kg)   PainSc: 8     Body mass index is 27.26 kg/m.  SABRA Physical Exam Constitutional:      General: He is not in acute distress. HENT:     Head: Normocephalic and atraumatic.  Eyes:     General: No scleral icterus. Cardiovascular:     Rate and Rhythm: Normal rate and regular rhythm.  Pulmonary:     Effort: Pulmonary effort is normal. No respiratory distress.     Breath sounds: Normal breath sounds.  Abdominal:     General: Bowel sounds are normal.     Palpations: Abdomen is soft.  Musculoskeletal:        General: No deformity. Normal range of motion.     Cervical back: Normal range of motion and neck supple.  Skin:    General: Skin is warm and dry.  Neurological:     Mental Status: He is alert and oriented to person, place, and time. Mental status is at baseline.  Psychiatric:        Mood and Affect: Mood normal.     LABORATORY DATA:  I have reviewed the data as listed    Latest Ref Rng & Units 11/29/2023    1:08 PM 09/29/2023    1:45 PM 08/29/2023    1:26 PM  CBC  WBC 4.0 - 10.5 K/uL 8.9  8.6  12.5   Hemoglobin 13.0 - 17.0 g/dL 85.9  85.9  85.7   Hematocrit 39.0 - 52.0 % 43.9  44.6  44.0   Platelets 150 - 400 K/uL 114  111  219       Latest Ref Rng & Units 11/29/2023    1:08 PM 07/27/2023   10:55 AM 06/06/2023    8:13 AM  CMP  Glucose 70 - 99 mg/dL 97  93  88    BUN 8 - 23 mg/dL 12  14  9    Creatinine 0.61 - 1.24 mg/dL 9.07  9.06  9.04   Sodium 135 - 145 mmol/L 133  135  135   Potassium 3.5 - 5.1 mmol/L 4.0  4.1  4.2   Chloride 98 - 111 mmol/L 98  101  98   CO2 22 - 32 mmol/L 26  27  24    Calcium 8.9 - 10.3 mg/dL 8.2  8.2  8.6   Total Protein 6.5 - 8.1 g/dL 5.9  5.7  5.5   Total Bilirubin 0.0 -  1.2 mg/dL 1.1  1.5  1.0   Alkaline Phos 38 - 126 U/L 28  27  41   AST 15 - 41 U/L 14  17  13    ALT 0 - 44 U/L 9  10  9       Iron/TIBC/Ferritin/ %Sat    Component Value Date/Time   IRON 49 09/20/2019 1201   TIBC 318 09/20/2019 1201   FERRITIN 24 09/20/2019 1201   IRONPCTSAT 15 (L) 09/20/2019 1201      RADIOGRAPHIC STUDIES: I have personally reviewed the radiological images as listed and agreed with the findings in the report. No results found.

## 2023-11-29 NOTE — Assessment & Plan Note (Signed)
 He weighed 226 Ib in Oct 2021, 222 Ib in March 2023, 213 Ib in  Dec 2023, March 2024 and now 206-208 Ib in March 2025.  CT abdomen pelvis w contrast did not have finding to explain weight loss.  Suspect weight loss to be due to his history of secondary adrenal insufficiency

## 2023-11-29 NOTE — Assessment & Plan Note (Addendum)
 JAK2 V617F mutation. Labs reviewed and discussed with patient. Thrombocytopenia. No splenomegaly on physical examination.  Decrease hydroxyurea  regimen to  500mg  daily 5 days per week, and 1000 mg daily for 2 days per week.  Repeat CBC in 6 weeks.

## 2023-11-29 NOTE — Assessment & Plan Note (Addendum)
 Recommend B12 2500mcg sublingual.- otc supply

## 2023-11-29 NOTE — Assessment & Plan Note (Signed)
 eliquis  2.5mg  BID.  -Patient also takes  plavix  75 mg for CAD.

## 2023-11-29 NOTE — Addendum Note (Signed)
 Addended by: BABARA CALL on: 11/29/2023 08:43 PM   Modules accepted: Orders

## 2023-11-29 NOTE — Assessment & Plan Note (Addendum)
 Unknown etiology.  Patient previously declined CT angiogram to rule out pulm embolism.

## 2023-12-20 ENCOUNTER — Encounter
Admission: RE | Disposition: A | Discharge status: Home / Self Care | Payer: Self-pay | Source: Home / Self Care | Attending: Cardiology

## 2023-12-20 ENCOUNTER — Ambulatory Visit
Admission: RE | Admit: 2023-12-20 | Discharge: 2023-12-20 | Disposition: A | Discharge status: Home / Self Care | Attending: Cardiology | Admitting: Cardiology

## 2023-12-20 ENCOUNTER — Encounter: Payer: Self-pay | Admitting: Cardiology

## 2023-12-20 ENCOUNTER — Other Ambulatory Visit: Payer: Self-pay

## 2023-12-20 DIAGNOSIS — R079 Chest pain, unspecified: Secondary | ICD-10-CM

## 2023-12-20 DIAGNOSIS — Z7901 Long term (current) use of anticoagulants: Secondary | ICD-10-CM | POA: Insufficient documentation

## 2023-12-20 DIAGNOSIS — I10 Essential (primary) hypertension: Secondary | ICD-10-CM | POA: Insufficient documentation

## 2023-12-20 DIAGNOSIS — I2584 Coronary atherosclerosis due to calcified coronary lesion: Secondary | ICD-10-CM | POA: Insufficient documentation

## 2023-12-20 DIAGNOSIS — Z7982 Long term (current) use of aspirin: Secondary | ICD-10-CM | POA: Diagnosis not present

## 2023-12-20 DIAGNOSIS — Z86711 Personal history of pulmonary embolism: Secondary | ICD-10-CM | POA: Insufficient documentation

## 2023-12-20 DIAGNOSIS — Z7902 Long term (current) use of antithrombotics/antiplatelets: Secondary | ICD-10-CM | POA: Diagnosis not present

## 2023-12-20 DIAGNOSIS — Z86718 Personal history of other venous thrombosis and embolism: Secondary | ICD-10-CM | POA: Diagnosis not present

## 2023-12-20 DIAGNOSIS — E785 Hyperlipidemia, unspecified: Secondary | ICD-10-CM | POA: Diagnosis not present

## 2023-12-20 DIAGNOSIS — Z955 Presence of coronary angioplasty implant and graft: Secondary | ICD-10-CM | POA: Insufficient documentation

## 2023-12-20 DIAGNOSIS — Z79899 Other long term (current) drug therapy: Secondary | ICD-10-CM | POA: Diagnosis not present

## 2023-12-20 DIAGNOSIS — I25118 Atherosclerotic heart disease of native coronary artery with other forms of angina pectoris: Secondary | ICD-10-CM | POA: Insufficient documentation

## 2023-12-20 DIAGNOSIS — I2582 Chronic total occlusion of coronary artery: Secondary | ICD-10-CM | POA: Diagnosis not present

## 2023-12-20 HISTORY — PX: LEFT HEART CATH AND CORONARY ANGIOGRAPHY: CATH118249

## 2023-12-20 SURGERY — LEFT HEART CATH AND CORONARY ANGIOGRAPHY
Anesthesia: Moderate Sedation | Laterality: Left

## 2023-12-20 MED ORDER — VERAPAMIL HCL 2.5 MG/ML IV SOLN
INTRAVENOUS | Status: DC | PRN
  Administered 2023-12-20: 2.5 mg via INTRAVENOUS

## 2023-12-20 MED ORDER — SODIUM CHLORIDE 0.9 % IV SOLN: 250.0000 mL | INTRAVENOUS | Status: DC | PRN

## 2023-12-20 MED ORDER — FREE WATER
500.0000 mL | Freq: Once | Status: DC
Start: 2023-12-20 — End: 2023-12-20

## 2023-12-20 MED ORDER — ONDANSETRON HCL 4 MG/2ML IJ SOLN: 4.0000 mg | Freq: Four times a day (QID) | INTRAMUSCULAR | Status: DC | PRN

## 2023-12-20 MED ORDER — MIDAZOLAM HCL (PF) 2 MG/2ML IJ SOLN
INTRAMUSCULAR | Status: DC | PRN
Start: 2023-12-20 — End: 2023-12-20
  Administered 2023-12-20: 1 mg via INTRAVENOUS

## 2023-12-20 MED ORDER — SODIUM CHLORIDE 0.9% FLUSH: 3.0000 mL | INTRAVENOUS | Status: DC | PRN

## 2023-12-20 MED ORDER — LABETALOL HCL 5 MG/ML IV SOLN
INTRAVENOUS | Status: AC
  Filled 2023-12-20: qty 4

## 2023-12-20 MED ORDER — LABETALOL HCL 5 MG/ML IV SOLN
INTRAVENOUS | Status: AC
  Administered 2023-12-20: 10 mg via INTRAVENOUS
  Filled 2023-12-20: qty 4

## 2023-12-20 MED ORDER — FENTANYL CITRATE (PF) 100 MCG/2ML IJ SOLN
INTRAMUSCULAR | Status: AC
  Filled 2023-12-20: qty 2

## 2023-12-20 MED ORDER — HYDRALAZINE HCL 20 MG/ML IJ SOLN
10.0000 mg | INTRAMUSCULAR | Status: DC | PRN
Start: 2023-12-20 — End: 2023-12-20

## 2023-12-20 MED ORDER — SODIUM CHLORIDE 0.9 % IV SOLN
250.0000 mL | INTRAVENOUS | Status: DC | PRN
  Administered 2023-12-20: 250 mL via INTRAVENOUS

## 2023-12-20 MED ORDER — FENTANYL CITRATE (PF) 100 MCG/2ML IJ SOLN
INTRAMUSCULAR | Status: DC | PRN
  Administered 2023-12-20: 50 ug via INTRAVENOUS

## 2023-12-20 MED ORDER — ACETAMINOPHEN 325 MG PO TABS: 650.0000 mg | ORAL_TABLET | ORAL | Status: DC | PRN

## 2023-12-20 MED ORDER — ASPIRIN 81 MG PO CHEW
CHEWABLE_TABLET | ORAL | Status: AC
  Administered 2023-12-20: 81 mg via ORAL
  Filled 2023-12-20: qty 1

## 2023-12-20 MED ORDER — LIDOCAINE HCL 1 % IJ SOLN
INTRAMUSCULAR | Status: AC
Start: 2023-12-20 — End: 2023-12-20
  Filled 2023-12-20: qty 20

## 2023-12-20 MED ORDER — SODIUM CHLORIDE 0.9% FLUSH: 3.0000 mL | Freq: Two times a day (BID) | INTRAVENOUS | Status: DC

## 2023-12-20 MED ORDER — HEPARIN SODIUM (PORCINE) 1000 UNIT/ML IJ SOLN
INTRAMUSCULAR | Status: AC
  Filled 2023-12-20: qty 10

## 2023-12-20 MED ORDER — VERAPAMIL HCL 2.5 MG/ML IV SOLN
INTRAVENOUS | Status: AC
  Filled 2023-12-20: qty 2

## 2023-12-20 MED ORDER — LABETALOL HCL 5 MG/ML IV SOLN
10.0000 mg | INTRAVENOUS | Status: DC | PRN
  Administered 2023-12-20 (×2): 10 mg via INTRAVENOUS

## 2023-12-20 MED ORDER — HEPARIN (PORCINE) IN NACL 1000-0.9 UT/500ML-% IV SOLN
INTRAVENOUS | Status: AC
Start: 2023-12-20 — End: 2023-12-20
  Filled 2023-12-20: qty 1000

## 2023-12-20 MED ORDER — ASPIRIN 81 MG PO CHEW: 81.0000 mg | CHEWABLE_TABLET | ORAL | Status: AC

## 2023-12-20 MED ORDER — IOHEXOL 300 MG/ML  SOLN
INTRAMUSCULAR | Status: DC | PRN
  Administered 2023-12-20: 85 mL

## 2023-12-20 MED ORDER — MIDAZOLAM HCL 2 MG/2ML IJ SOLN
INTRAMUSCULAR | Status: AC
  Filled 2023-12-20: qty 2

## 2023-12-20 MED ORDER — LIDOCAINE HCL (PF) 1 % IJ SOLN
INTRAMUSCULAR | Status: DC | PRN
Start: 2023-12-20 — End: 2023-12-20
  Administered 2023-12-20: 5 mL

## 2023-12-20 MED ORDER — HEPARIN SODIUM (PORCINE) 1000 UNIT/ML IJ SOLN
INTRAMUSCULAR | Status: DC | PRN
  Administered 2023-12-20: 4500 [IU] via INTRAVENOUS

## 2023-12-20 MED ORDER — FREE WATER
500.0000 mL | Freq: Once | Status: AC
  Administered 2023-12-20: 500 mL via ORAL

## 2023-12-20 MED ORDER — HEPARIN (PORCINE) IN NACL 2000-0.9 UNIT/L-% IV SOLN
INTRAVENOUS | Status: DC | PRN
  Administered 2023-12-20: 1000 mL

## 2023-12-20 SURGICAL SUPPLY — 9 items
CATH INFINITI 5 FR JL3.5 (CATHETERS) IMPLANT
CATH INFINITI JR4 5F (CATHETERS) IMPLANT
DEVICE RAD TR BAND REGULAR (VASCULAR PRODUCTS) IMPLANT
DRAPE BRACHIAL (DRAPES) IMPLANT
GLIDESHEATH SLEND SS 6F .021 (SHEATH) IMPLANT
GUIDEWIRE INQWIRE 1.5J.035X260 (WIRE) IMPLANT
PACK CARDIAC CATH (CUSTOM PROCEDURE TRAY) ×1 IMPLANT
SET ATX-X65L (MISCELLANEOUS) IMPLANT
STATION PROTECTION PRESSURIZED (MISCELLANEOUS) IMPLANT

## 2024-01-10 ENCOUNTER — Inpatient Hospital Stay

## 2024-01-19 ENCOUNTER — Inpatient Hospital Stay: Attending: Oncology

## 2024-01-19 DIAGNOSIS — D72820 Lymphocytosis (symptomatic): Secondary | ICD-10-CM

## 2024-01-19 DIAGNOSIS — D45 Polycythemia vera: Secondary | ICD-10-CM | POA: Insufficient documentation

## 2024-01-19 LAB — CBC WITH DIFFERENTIAL (CANCER CENTER ONLY)
Abs Immature Granulocytes: 0.11 K/uL — ABNORMAL HIGH (ref 0.00–0.07)
Basophils Absolute: 0.1 K/uL (ref 0.0–0.1)
Basophils Relative: 1 %
Eosinophils Absolute: 0.1 K/uL (ref 0.0–0.5)
Eosinophils Relative: 1 %
HCT: 43.9 % (ref 39.0–52.0)
Hemoglobin: 14 g/dL (ref 13.0–17.0)
Immature Granulocytes: 1 %
Lymphocytes Relative: 26 %
Lymphs Abs: 2.5 K/uL (ref 0.7–4.0)
MCH: 32.1 pg (ref 26.0–34.0)
MCHC: 31.9 g/dL (ref 30.0–36.0)
MCV: 100.7 fL — ABNORMAL HIGH (ref 80.0–100.0)
Monocytes Absolute: 0.9 K/uL (ref 0.1–1.0)
Monocytes Relative: 9 %
Neutro Abs: 6.1 K/uL (ref 1.7–7.7)
Neutrophils Relative %: 62 %
Platelet Count: 178 K/uL (ref 150–400)
RBC: 4.36 MIL/uL (ref 4.22–5.81)
RDW: 14.6 % (ref 11.5–15.5)
WBC Count: 9.7 K/uL (ref 4.0–10.5)
nRBC: 0 % (ref 0.0–0.2)

## 2024-01-19 LAB — VITAMIN B12: Vitamin B-12: 2964 pg/mL — ABNORMAL HIGH (ref 180–914)

## 2024-01-23 LAB — COMP PANEL: LEUKEMIA/LYMPHOMA

## 2024-02-25 ENCOUNTER — Other Ambulatory Visit: Payer: Self-pay

## 2024-02-25 DIAGNOSIS — Z23 Encounter for immunization: Secondary | ICD-10-CM | POA: Diagnosis not present

## 2024-02-25 DIAGNOSIS — I2510 Atherosclerotic heart disease of native coronary artery without angina pectoris: Secondary | ICD-10-CM | POA: Diagnosis not present

## 2024-02-25 DIAGNOSIS — I10 Essential (primary) hypertension: Secondary | ICD-10-CM | POA: Insufficient documentation

## 2024-02-25 DIAGNOSIS — W208XXA Other cause of strike by thrown, projected or falling object, initial encounter: Secondary | ICD-10-CM | POA: Diagnosis not present

## 2024-02-25 DIAGNOSIS — S91215A Laceration without foreign body of left lesser toe(s) with damage to nail, initial encounter: Secondary | ICD-10-CM | POA: Diagnosis present

## 2024-02-25 DIAGNOSIS — Z85828 Personal history of other malignant neoplasm of skin: Secondary | ICD-10-CM | POA: Diagnosis not present

## 2024-02-25 DIAGNOSIS — Z79899 Other long term (current) drug therapy: Secondary | ICD-10-CM | POA: Diagnosis not present

## 2024-02-26 ENCOUNTER — Encounter: Payer: Self-pay | Admitting: Oncology

## 2024-02-26 ENCOUNTER — Emergency Department (HOSPITAL_BASED_OUTPATIENT_CLINIC_OR_DEPARTMENT_OTHER)

## 2024-02-26 ENCOUNTER — Emergency Department (HOSPITAL_BASED_OUTPATIENT_CLINIC_OR_DEPARTMENT_OTHER)
Admission: EM | Admit: 2024-02-26 | Discharge: 2024-02-26 | Disposition: A | Attending: Emergency Medicine | Admitting: Emergency Medicine

## 2024-02-26 ENCOUNTER — Encounter (HOSPITAL_BASED_OUTPATIENT_CLINIC_OR_DEPARTMENT_OTHER): Payer: Self-pay | Admitting: Emergency Medicine

## 2024-02-26 DIAGNOSIS — S91215A Laceration without foreign body of left lesser toe(s) with damage to nail, initial encounter: Secondary | ICD-10-CM

## 2024-02-26 DIAGNOSIS — S91209A Unspecified open wound of unspecified toe(s) with damage to nail, initial encounter: Secondary | ICD-10-CM

## 2024-02-26 DIAGNOSIS — S91219A Laceration without foreign body of unspecified toe(s) with damage to nail, initial encounter: Secondary | ICD-10-CM

## 2024-02-26 MED ORDER — CEPHALEXIN 250 MG PO CAPS
500.0000 mg | ORAL_CAPSULE | Freq: Once | ORAL | Status: AC
  Administered 2024-02-26: 500 mg via ORAL
  Filled 2024-02-26: qty 2

## 2024-02-26 MED ORDER — ACETAMINOPHEN 500 MG PO TABS
1000.0000 mg | ORAL_TABLET | Freq: Once | ORAL | Status: AC
  Administered 2024-02-26: 1000 mg via ORAL
  Filled 2024-02-26: qty 2

## 2024-02-26 MED ORDER — LIDOCAINE HCL 2 % IJ SOLN
10.0000 mL | Freq: Once | INTRAMUSCULAR | Status: AC
  Administered 2024-02-26: 200 mg via INTRADERMAL
  Filled 2024-02-26: qty 20

## 2024-02-26 MED ORDER — TETANUS-DIPHTH-ACELL PERTUSSIS 5-2-15.5 LF-MCG/0.5 IM SUSP
0.5000 mL | Freq: Once | INTRAMUSCULAR | Status: AC
  Administered 2024-02-26: 0.5 mL via INTRAMUSCULAR
  Filled 2024-02-26: qty 0.5

## 2024-02-26 MED ORDER — CEPHALEXIN 500 MG PO CAPS: 500.0000 mg | ORAL_CAPSULE | Freq: Three times a day (TID) | ORAL | 0 refills | Status: AC

## 2024-02-26 NOTE — ED Notes (Signed)
 X-ray at bedside

## 2024-02-26 NOTE — ED Provider Notes (Signed)
 " [place] EMERGENCY DEPARTMENT AT [facility] Provider Note  CSN: [id number] Arrival date & time: 02/25/24 2358  Chief Complaint(s) No chief complaint on file.  History provided by patient and family. HPI & MDM Edward Mejia is a 79 y.o. male .   Toe Pain This is a new problem. The current episode started 3 to 5 hours ago. The problem occurs constantly. The problem has not changed since onset.Pertinent negatives include no chest pain, no abdominal pain, no headaches and no shortness of breath. The symptoms are aggravated by walking. Nothing relieves the symptoms.   Patient dropped a water  bottle on his toe causing it to bleed. It continued to bleed since initial incident. Patient is on Eliquis .   Medical Decision Making Amount and/or Complexity of Data Reviewed Radiology: ordered and independent interpretation performed. Decision-making details documented in ED Course.  Risk OTC drugs. Prescription drug management.   Toenail injury Near complete avulsion of the nail with nailbed laceration and paronychia laceration. This was thoroughly irrigated and closed as below X-ray negative for underlying fracture Tdap booster given Given location, will prescribe prophylactic antibiotic Podiatry follow-up recommended  Final Clinical Impression(s) / ED Diagnoses Final diagnoses:  Toenail avulsion, initial encounter  Laceration of nail bed of toe, initial encounter  Laceration of lesser toe of left foot without foreign body with damage to nail, initial encounter   The patient appears reasonably screened and/or stabilized for discharge and I doubt any other medical condition or other [facility] requiring further screening, evaluation, or treatment in the ED at this time. I have discussed the findings, Dx and Tx plan with the patient/family who expressed understanding and agree(s) with the plan. Discharge instructions discussed at length. The patient/family was given strict return  precautions who verbalized understanding of the instructions. No further questions at time of discharge.  Disposition: Discharge  Condition: Good  ED Discharge Orders          Ordered    cephALEXin  (KEFLEX ) 500 MG capsule  3 times daily        02/26/24 0358              Follow Up: [facility] Address: [address] [place], [place], [place] [address] Phone: [phone] Appointments: [email or web address] Schedule an appointment as soon as possible for a visit    Gasper Nancyann BRAVO, MD [address] [place] [address] [place] [address] [phone]  Call  as needed     Past Medical History Past Medical History:  Diagnosis Date   Arthritis    Bilateral pulmonary embolism (HCC) 03/2019   in setting of Covid-19 pneumonia   Cancer (HCC)    skin cancer   Cellulitis    Cellulitis    DVT (deep venous thrombosis) (HCC) 03/2019   in setting of Covid-19 pneumonia   Erythrocytosis 09/20/2019   H/O adenomatous polyp of colon 03/24/2015   HCAP (healthcare-associated pneumonia) 04/12/2019   History of brain disorder: history of amaurosis fugax 03/24/2015   History of pneumonia 04/04/2008   History of rheumatic fever 03/24/2015   DID Have Rheumatic Fever.     Hypertension    Left leg DVT (HCC) 04/12/2019   In setting of Covid-19 started anticoagulation 04-12-2019. Subsequently Dx PCV and long term NOAC per hematology    Peptic ulcer disease    Sepsis (HCC) 04/12/2019   secondary to Covid pneumonia   Patient Active Problem List   Diagnosis Date Noted   SCC (squamous cell carcinoma), leg, right 09/01/2023  Secondary adrenal insufficiency 08/29/2023   SOB (shortness of breath) 08/29/2023   Hypocalcemia 08/29/2023   Family history of pancreatic cancer 04/27/2023   CAD (coronary artery disease) 01/24/2023   Ataxia 12/23/2022   Peripheral neuropathy 12/23/2022   B12 deficiency 10/28/2022   Balance problem 10/27/2022   Osteoarthritis of bilateral glenohumeral joints 10/20/2022    Unintentional weight loss 11/05/2021   Hyponatremia 11/05/2021   S/P drug eluting coronary stent placement 10/26/2021   Encounter for antineoplastic chemotherapy 09/23/2020   History of pulmonary embolism 11/29/2019   Post-thrombotic syndrome 11/29/2019   Monoclonal B-cell lymphocytosis of undetermined significance 11/29/2019   Current use of long term anticoagulation 10/30/2019   JAK2 V617F mutation 10/04/2019   Polycythemia vera (HCC) 10/04/2019   Goals of care, counseling/discussion 10/04/2019   Erythrocytosis 09/20/2019   History of 2019 novel coronavirus disease (COVID-19) 04/03/2019   PVD (peripheral vascular disease) 04/03/2019   Coronary artery disease 02/01/2018   Recurrent cellulitis of lower extremity 04/04/2017   Benign essential HTN 05/18/2016   Arthritis 03/24/2015   History of GI bleed 03/24/2015   History of brain disorder: history of amaurosis fugax 03/24/2015   H/O adenomatous polyp of colon 03/24/2015   Immunosuppressed status 03/24/2015   Psoriatic arthritis (HCC) 08/10/2011   Herpes 03/26/2007   Dyslipidemia 11/25/2005   Hypercholesteremia 11/23/2005   Home Medication(s) Prior to Admission medications  Medication Sig Start Date End Date Taking? Authorizing Provider  cephALEXin  (KEFLEX ) 500 MG capsule Take 1 capsule (500 mg total) by mouth 3 (three) times daily for 5 days. 02/26/24 03/02/24 Yes Daeveon Zweber, Raynell Moder, MD  acetaminophen  (TYLENOL ) 500 MG tablet Take 1,000 mg by mouth every 6 (six) hours as needed for moderate pain (pain score 4-6) or mild pain (pain score 1-3).    [provider]  alendronate (FOSAMAX) 70 MG tablet Take 70 mg by mouth once a week.    [provider]  amLODipine (NORVASC) 2.5 MG tablet Take 2.5 mg by mouth daily. 04/07/23   [provider]  apixaban  (ELIQUIS ) 2.5 MG TABS tablet Take 1 tablet (2.5 mg total) by mouth 2 (two) times daily. 08/29/23   Yu, Zhou, MD  CALCIUM PO Take 600 mg by mouth 2 (two) times  daily.    [provider]  clopidogrel  (PLAVIX ) 75 MG tablet Take 1 tablet (75 mg total) by mouth daily with breakfast. 01/25/23   Hudson, Caralyn, PA-C  Cyanocobalamin  (VITAMIN B12 PO) Take 1 tablet by mouth daily at 12 noon.    [provider]  etanercept (ENBREL) 50 MG/ML injection Inject 50 mg into the skin every Thursday.     [provider]  fludrocortisone (FLORINEF) 0.1 MG tablet Take 0.05 mg by mouth daily.    [provider]  gabapentin  (NEURONTIN ) 300 MG capsule Take 900 mg by mouth See admin instructions. At 4 pm 10/02/18   [provider]  HYDROcodone -acetaminophen  (NORCO) 10-325 MG tablet Take 1 tablet by mouth every 6 (six) hours as needed for moderate pain (pain score 4-6). 09/28/20   [provider]  hydroxyurea  (HYDREA ) 500 MG capsule Take 1 capsule (500 mg total) by mouth See admin instructions. May take with food to minimize GI side effects.TAKE 1 CAPSULE BY MOUTH ON WEDNESDAYS, SUNDAYS AND 2 CAPSULES DAILY FOR THE REST OF THE WEEK. MAY TAKE WITH FOOD TO MINIMIZE GI SIDE EFFECTS Patient taking differently: Take 500 mg by mouth See admin instructions. 1,000mg  twice weekly and 500mg  rest of the week. 08/29/23   Babara,  Zelphia, MD  isosorbide  mononitrate (IMDUR ) 120 MG 24 hr tablet Take 120 mg by mouth daily.  09/24/18   [provider]  nitroGLYCERIN  (NITROSTAT ) 0.4 MG SL tablet Place 0.4 mg under the tongue every 5 (five) minutes as needed for chest pain.    [provider]  predniSONE  (DELTASONE ) 5 MG tablet Take 7.5 mg by mouth every morning. 11/09/19   [provider]  ranolazine  (RANEXA ) 1000 MG SR tablet Take 1,000 mg by mouth 2 (two) times daily. 09/05/22 12/20/23  [provider]  Vitamin D, Ergocalciferol, (DRISDOL) 1.25 MG (50000 UNIT) CAPS capsule Take 50,000 Units by mouth every 7 (seven) days. 05/26/23   [provider]                                                                                                                                     Allergies Pravachol [pravastatin]  Review of Systems Review of Systems  Respiratory:  Negative for shortness of breath.   Cardiovascular:  Negative for chest pain.  Gastrointestinal:  Negative for abdominal pain.  Neurological:  Negative for headaches.   As noted in HPI  Physical Exam Vital Signs  I have reviewed the triage vital signs BP (!) 167/92   Pulse (!) 55   Temp 98 F (36.7 C) (Oral)   Resp 18   SpO2 100%   Physical Exam Vitals reviewed.  Constitutional:      General: He is not in acute distress.    Appearance: He is well-developed. He is not diaphoretic.  HENT:     Head: Normocephalic and atraumatic.     Right Ear: External ear normal.     Left Ear: External ear normal.     Nose: Nose normal.     Mouth/Throat:     Mouth: Mucous membranes are moist.  Eyes:     General: No scleral icterus.    Conjunctiva/sclera: Conjunctivae normal.  Neck:     Trachea: Phonation normal.  Cardiovascular:     Rate and Rhythm: Normal rate and regular rhythm.  Pulmonary:     Effort: Pulmonary effort is normal. No respiratory distress.     Breath sounds: No stridor.  Abdominal:     General: There is no distension.  Musculoskeletal:        General: Normal range of motion.     Cervical back: Normal range of motion.       Feet:  Feet:     Left foot:     Toenail Condition: Fungal disease present. Neurological:     Mental Status: He is alert and oriented to person, place, and time.  Psychiatric:        Behavior: Behavior normal.     ED Results and Treatments Labs (all labs ordered are listed, but only abnormal results are displayed) Labs Reviewed - No data to display  EKG  EKG Interpretation Date/Time:    Ventricular Rate:    PR Interval:    QRS Duration:    QT Interval:    QTC  Calculation:   R Axis:      Text Interpretation:         Radiology DG Foot 2 Views Left Result Date: 02/26/2024 EXAM: 2 VIEW(S) XRAY OF THE LEFT FOOT 02/26/2024 01:53:00 AM COMPARISON: 10/19/2018. CLINICAL HISTORY: toe injury FINDINGS: BONES AND JOINTS: No definitive fracture is seen. No malalignment. Calcaneal spurs. First MTP degenerative change and mild hallux valgus deformity. SOFT TISSUES: Vascular calcifications. IMPRESSION: 1. No acute fracture. Electronically signed by: Oneil Devonshire MD 02/26/2024 02:04 AM EST RP Workstation: [id number]    Medications Ordered in ED Medications  Tdap (ADACEL ) injection 0.5 mL (0.5 mLs Intramuscular Given 02/26/24 0146)  acetaminophen  (TYLENOL ) tablet 1,000 mg (1,000 mg Oral Given 02/26/24 0145)  lidocaine  (XYLOCAINE ) 2 % (with pres) injection 200 mg (200 mg Intradermal Given by Other 02/26/24 0338)  cephALEXin  (KEFLEX ) capsule 500 mg (500 mg Oral Given 02/26/24 0337)   Procedures .Laceration Repair  Date/Time: 02/26/2024 3:27 AM  Performed by: Trine Raynell Moder, MD Authorized by: Trine Raynell Moder, MD   Consent:    Consent obtained:  Verbal   Consent given by:  Patient   Risks discussed:  Pain, poor cosmetic result, poor wound healing and infection Universal protocol:    Imaging studies available: yes   Anesthesia:    Anesthesia method:  Local infiltration   Local anesthetic:  Lidocaine  2% w/o epi Laceration details:    Location:  Toe   Toe location:  L third toe   Length (cm):  0.5   Laceration depth: 1-2 mm. Pre-procedure details:    Preparation:  Patient was prepped and draped in usual sterile fashion and imaging obtained to evaluate for foreign bodies Exploration:    Imaging obtained: x-ray     Imaging outcome: foreign body not noted     Wound extent: no underlying fracture   Treatment:    Irrigation solution:  Sterile saline   Irrigation volume:  1000 cc   Irrigation method:  Pressure wash   Debridement:   Minimal Skin repair:    Repair method:  Sutures   Suture size:  5-0   Suture material:  Chromic gut   Suture technique:  Simple interrupted   Number of sutures:  5 Approximation:    Approximation:  Close Repair type:    Repair type:  Intermediate Post-procedure details:    Dressing:  Non-adherent dressing   Procedure completion:  Tolerated   (including critical care time)   This chart was dictated using voice recognition software.  Despite best efforts to proofread,  errors can occur which can change the documentation meaning.   Trine Raynell Moder, MD 02/26/24 [phone]  "

## 2024-02-26 NOTE — Discharge Instructions (Signed)
Do not let your laceration (cut) get wet for the next 48 hours. After that you may allow soapy water to drain down the wound to clean it.  Please do not scrub.  Do not submerge the wound under water for the next 2 weeks.  To minimize scarring, you can apply a vaseline based ointment for the next 2 weeks and keep it out of direct sun light. After that, you may apply sunscreen for the next several months.  Your stitches will dissolve on their own.   Return if your wound appears to be infected (see laceration care instructions).

## 2024-02-26 NOTE — ED Notes (Signed)
 Pillow placed under patient's knee for comfort.

## 2024-02-26 NOTE — ED Triage Notes (Signed)
 Dropped metal tumbler on left middle toe. Laceration along nail bed. Nail in place though very loose. Plavix  and eliquis . Re-dressed three times PTA. Foot cleaned with wound cleanser on arrival and dressed in sterile gauze with light pressure.

## 2024-02-27 ENCOUNTER — Encounter: Payer: Self-pay | Admitting: Oncology

## 2024-02-27 ENCOUNTER — Inpatient Hospital Stay: Admitting: Oncology

## 2024-02-27 ENCOUNTER — Inpatient Hospital Stay: Attending: Oncology

## 2024-02-27 VITALS — BP 137/73 | HR 65 | Temp 96.8°F | Resp 18 | Wt 198.9 lb

## 2024-02-27 DIAGNOSIS — D45 Polycythemia vera: Secondary | ICD-10-CM

## 2024-02-27 DIAGNOSIS — D72820 Lymphocytosis (symptomatic): Secondary | ICD-10-CM

## 2024-02-27 DIAGNOSIS — R634 Abnormal weight loss: Secondary | ICD-10-CM | POA: Diagnosis not present

## 2024-02-27 DIAGNOSIS — Z86711 Personal history of pulmonary embolism: Secondary | ICD-10-CM | POA: Diagnosis not present

## 2024-02-27 LAB — CMP (CANCER CENTER ONLY)
ALT: 6 U/L (ref 0–44)
AST: 12 U/L — ABNORMAL LOW (ref 15–41)
Albumin: 3.9 g/dL (ref 3.5–5.0)
Alkaline Phosphatase: 35 U/L — ABNORMAL LOW (ref 38–126)
Anion gap: 8 (ref 5–15)
BUN: 10 mg/dL (ref 8–23)
CO2: 27 mmol/L (ref 22–32)
Calcium: 8.8 mg/dL — ABNORMAL LOW (ref 8.9–10.3)
Chloride: 99 mmol/L (ref 98–111)
Creatinine: 1 mg/dL (ref 0.61–1.24)
GFR, Estimated: >60 mL/min
Glucose, Bld: 86 mg/dL (ref 70–99)
Potassium: 4 mmol/L (ref 3.5–5.1)
Sodium: 134 mmol/L — ABNORMAL LOW (ref 135–145)
Total Bilirubin: 0.8 mg/dL (ref 0.0–1.2)
Total Protein: 5.9 g/dL — ABNORMAL LOW (ref 6.5–8.1)

## 2024-02-27 LAB — CBC WITH DIFFERENTIAL (CANCER CENTER ONLY)
Abs Immature Granulocytes: 0.19 K/uL — ABNORMAL HIGH (ref 0.00–0.07)
Basophils Absolute: 0.1 K/uL (ref 0.0–0.1)
Basophils Relative: 1 %
Eosinophils Absolute: 0.1 K/uL (ref 0.0–0.5)
Eosinophils Relative: 1 %
HCT: 42.5 % (ref 39.0–52.0)
Hemoglobin: 13.6 g/dL (ref 13.0–17.0)
Immature Granulocytes: 2 %
Lymphocytes Relative: 33 %
Lymphs Abs: 4 K/uL (ref 0.7–4.0)
MCH: 30.8 pg (ref 26.0–34.0)
MCHC: 32 g/dL (ref 30.0–36.0)
MCV: 96.2 fL (ref 80.0–100.0)
Monocytes Absolute: 1.1 K/uL — ABNORMAL HIGH (ref 0.1–1.0)
Monocytes Relative: 9 %
Neutro Abs: 7 K/uL (ref 1.7–7.7)
Neutrophils Relative %: 54 %
Platelet Count: 115 K/uL — ABNORMAL LOW (ref 150–400)
RBC: 4.42 MIL/uL (ref 4.22–5.81)
RDW: 14.7 % (ref 11.5–15.5)
WBC Count: 12.4 K/uL — ABNORMAL HIGH (ref 4.0–10.5)
nRBC: 0 % (ref 0.0–0.2)

## 2024-02-27 MED ORDER — HYDROXYUREA 500 MG PO CAPS: 500.0000 mg | ORAL_CAPSULE | ORAL | 1 refills | Status: AC

## 2024-02-27 NOTE — Progress Notes (Signed)
 " Hematology/Oncology Progress note [phone] [phone]     CHIEF COMPLAINTS/REASON FOR VISIT:  Follow-up for polycythemia vera-Jak [id number] mutation  ASSESSMENT & PLAN:   Polycythemia vera (HCC) JAK2 V617F mutation. Labs reviewed and discussed with patient. Thrombocytopenia. No splenomegaly on physical examination.  Recommend to decrease hydroxyurea  regimen to  500mg  daily 6 days per week, and 1000 mg daily for 1 day per week.    History of pulmonary embolism  eliquis  2.5mg  BID.  -Patient also takes  plavix  75 mg for CAD.    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  Continue observation   Hypocalcemia Normal PTH He has been on calcium supplementation.    No orders of the defined types were placed in this encounter.  Follow up per LOS   All questions were answered. The patient knows to call the clinic with any problems, questions or concerns.  Zelphia Cap, MD, PhD [facility] Hematology Oncology 02/27/2024      HISTORY OF PRESENTING ILLNESS:   Edward Mejia is a  79 y.o.  male with PMH including rheumatoid arthritis, peripheral vascular disease hypertension, Covid infection history,, was seen in consultation at the request of  Fisher, Nancyann BRAVO, MD  for evaluation of polycytosis Patient was accompanied by her wife who participated in providing medical history. February 2021, patient was hospitalized due to COVID-19 pneumonia, respiratory failure, treated with steroids, remdesivir , received bamlanivimab .  Hospitalization was also completed by [facility], bleeding was  thought to be secondary to hemorrhoids.  He was discharged on 04/07/2019 and presented back to emergency room on 04/12/2019 due to left lower extremity swelling, acute hypoxic respiratory failure.  Work-up showed acute bilateral pulmonary embolism, large left lower extremity DVT, thrombosis and IVC and left external  iliac vein.  Patient was started on anticoagulation and transition to Eliquis  Patient has been on Eliquis  since March 2021.  Patient also takes aspirin .  Patient has history of CAD, follows up with Dr. Bosie.  Rheumatoid arthritis on Enbrel once a week.  Also on prednisone  7.5 mg daily. Patient has also been found to have erythrocytosis. 09/20/2019, CBC showed hemoglobin of 17.6, reviewed previous medical records, erythrocytosis can be traced back to at least 2017.  Patient also has leukocytosis since March 2021.  Leukocytosis has trended down recently and CBC on 09/20/2019 showed a white count of 10.8, predominantly monocytosis.  Patient is a former smoker, 10-12-pack-year smoking history and he stopped smoking in 1984. Currently he has some chronic shortness of breath with exertion.  He has lost weight during his previous hospitalization and has gained weight since discharge.  Sometimes he feels sweaty.  Patient also reports intermittent dizziness/lightheaded episodes for the past couple of months.  He actually had an episode during today's visit and is spontaneously resolved after few minutes.  Denies any shortness of breath, chest pain, vision changes, focal deficits.  Patient has been referred to establish care with neurology for further evaluation.  #  psoriatic arthritis follows up with [facility] Dr. Selma.on chronic prednisone .  # skin squamous cell carcinoma removal from his scalp. #09/27/2021 He recently underwent cardiac cath for abnormal stress test and unstable angina s/p drug eluting coronary stent placement.   INTERVAL HISTORY Edward Mejia is a 79 y.o. male who has above history reviewed by me today presents for follow up visit for management of polycythemia vera  Discussed the use of AI scribe software for clinical note transcription with the patient, who  gave verbal consent to proceed.   He experiences significant fatigue, sleeping excessively both during the day and night, yet  continues to feel very tired. He takes multiple naps during the day, sometimes up to three two-hour naps. He also has difficulty breathing, especially when fans are turned on, which occurs two to four times a day. No shortness of breath at rest, but it occurs when getting up.  He has a history of adrenal insufficiency and is currently taking 7.5 mg of prednisone  daily along with a half tablet of fludrocortisone.  Patient takes steroids for rheumatoid arthritis.  His last cortisol level check in April showed low levels at 0.6. He is scheduled to see his endocrinologist in a week.  He has been experiencing weight loss despite having a good appetite and eating normal portions he eats normal portions.  He underwent surgery seven weeks ago for squamous cell cancer on his leg, which was successfully removed.  He is currently on hydroxyurea , with a recent adjustment to his dosage due to low platelet counts. His current regimen involves taking 500 mg 5 days per week and 1000 mg 2 days per week.   He has coronary artery disease status post percutaneous coronary intervention with stent placement in December 2025. He remains on clopidogrel  and apixaban    .Review of Systems  Constitutional:  Positive for fatigue. Negative for appetite change, chills, fever and unexpected weight change.  HENT:   Negative for hearing loss and voice change.   Eyes:  Negative for eye problems and icterus.  Respiratory:  Negative for chest tightness, cough and shortness of breath.   Cardiovascular:  Negative for chest pain and leg swelling.  Gastrointestinal:  Negative for abdominal distention, abdominal pain and blood in stool.  Endocrine: Negative for hot flashes.  Genitourinary:  Negative for difficulty urinating, dysuria and frequency.   Musculoskeletal:  Positive for gait problem. Negative for arthralgias.  Skin:  Negative for itching and rash.  Neurological:  Positive for dizziness and gait problem. Negative for  extremity weakness, light-headedness and numbness.       Balance issue  Hematological:  Negative for adenopathy. Bruises/bleeds easily.  Psychiatric/Behavioral:  Negative for confusion.     MEDICAL HISTORY:  Past Medical History:  Diagnosis Date   Arthritis    Bilateral pulmonary embolism (HCC) 03/2019   in setting of Covid-19 pneumonia   Cancer (HCC)    skin cancer   Cellulitis    Cellulitis    DVT (deep venous thrombosis) (HCC) 03/2019   in setting of Covid-19 pneumonia   Erythrocytosis 09/20/2019   H/O adenomatous polyp of colon 03/24/2015   HCAP (healthcare-associated pneumonia) 04/12/2019   History of brain disorder: history of amaurosis fugax 03/24/2015   History of pneumonia 04/04/2008   History of rheumatic fever 03/24/2015   DID Have Rheumatic Fever.     Hypertension    Left leg DVT (HCC) 04/12/2019   In setting of Covid-19 started anticoagulation 04-12-2019. Subsequently Dx PCV and long term NOAC per hematology    Peptic ulcer disease    Sepsis (HCC) 04/12/2019   secondary to Covid pneumonia    SURGICAL HISTORY: Past Surgical History:  Procedure Laterality Date   APPENDECTOMY     CORONARY STENT INTERVENTION N/A 01/24/2023   Procedure: CORONARY STENT INTERVENTION;  Surgeon: Ammon Blunt, MD;  Location: [facility] INVASIVE CV LAB;  Service: Cardiovascular;  Laterality: N/A;   LEFT HEART CATH AND CORONARY ANGIOGRAPHY Left 02/06/2018   Procedure: LEFT HEART CATH AND CORONARY ANGIOGRAPHY;  Surgeon: Bosie Vinie LABOR, MD;  Location: [facility] INVASIVE CV LAB;  Service: Cardiovascular;  Laterality: Left;   LEFT HEART CATH AND CORONARY ANGIOGRAPHY Left 01/24/2023   Procedure: LEFT HEART CATH AND CORONARY ANGIOGRAPHY;  Surgeon: Ammon Blunt, MD;  Location: [facility] INVASIVE CV LAB;  Service: Cardiovascular;  Laterality: Left;   LEFT HEART CATH AND CORONARY ANGIOGRAPHY Left 12/20/2023   Procedure: LEFT HEART CATH AND CORONARY ANGIOGRAPHY;  Surgeon: Ammon Blunt, MD;  Location:  [facility] INVASIVE CV LAB;  Service: Cardiovascular;  Laterality: Left;   MANDIBLE FRACTURE SURGERY     fractured jaw   PARTIAL GASTRECTOMY     peptic ulcer disease    SOCIAL HISTORY: Social History   Socioeconomic History   Marital status: Married    Spouse name: Particia    Number of children: 4   Years of education: Not on file   Highest education level: 8th grade  Occupational History   Occupation: Disabled    Comment: due to Psoriatic Arthritis  Tobacco Use   Smoking status: Former    Current packs/day: 0.00    Types: Cigarettes    Quit date: 09/20/1982    Years since quitting: 41.4   Smokeless tobacco: Never   Tobacco comments:    quit over 40+ years ago  Vaping Use   Vaping status: Never Used  Substance and Sexual Activity   Alcohol use: No    Alcohol/week: 0.0 standard drinks of alcohol   Drug use: No   Sexual activity: Not on file  Other Topics Concern   Not on file  Social History Narrative   Lives with [place], wife.  No indoor pets.    Social Drivers of Health   Tobacco Use: Medium Risk (02/27/2024)   Patient History    Smoking Tobacco Use: Former    Smokeless Tobacco Use: Never    Passive Exposure: Not on file  Financial Resource Strain: Low Risk  (12/11/2023)   Received from [facility]   Overall Financial Resource Strain (CARDIA)    Difficulty of Paying Living Expenses: Not hard at all  Food Insecurity: No Food Insecurity (12/11/2023)   Received from [facility]   Epic    Within the past 12 months, you worried that your food would run out before you got the money to buy more.: Never true    Within the past 12 months, the food you bought just didn't last and you didn't have money to get more.: Never true  Transportation Needs: No Transportation Needs (12/11/2023)   Received from [facility] - Transportation    In the past 12 months, has lack of transportation kept you from medical appointments or  from getting medications?: No    Lack of Transportation (Non-Medical): No  Physical Activity: Inactive (09/22/2023)   Received from [facility]   Exercise Vital Sign    On average, how many days per week do you engage in moderate to strenuous exercise (like a brisk walk)?: 0 days    Minutes of Exercise per Session: Not on file  Stress: No Stress Concern Present (09/22/2023)   Received from [facility] - Occupational Stress Questionnaire    Do you feel stress - tense, restless, nervous, or anxious, or unable to sleep at night because your mind is troubled all the time - these days?: Not at all  Social Connections: Moderately Integrated (09/22/2023)   Received from [facility]  Social Network    How would you rate your social network (family, work, friends)?: Adequate participation with social networks  Intimate Partner Violence: Not At Risk (09/22/2023)   Received from [facility]   HITS    Over the last 12 months how often did your partner physically hurt you?: Never    Over the last 12 months how often did your partner insult you or talk down to you?: Never    Over the last 12 months how often did your partner threaten you with physical harm?: Never    Over the last 12 months how often did your partner scream or curse at you?: Never  Depression (PHQ2-9): Low Risk (11/29/2023)   Depression (PHQ2-9)    PHQ-2 Score: 0  Alcohol Screen: Not on file  Housing: Low Risk  (12/11/2023)   Received from [facility]   Epic    At any time in the past 12 months, were you homeless or living in a shelter (including now)?: No    In the last 12 months, was there a time when you were not able to pay the mortgage or rent on time?: No    In the past 12 months, how many times have you moved where you were living?: 0  Utilities: Not At Risk (12/11/2023)   Received from [facility]   Epic    In the past 12 months has the  electric, gas, oil, or water  company threatened to shut off services in your home?: No  Health Literacy: Not on file    FAMILY HISTORY: Family History  Problem Relation Age of Onset   Cancer Mother        lung cancer   Heart disease Father 23       heart attack   Cancer Brother        pancreatic cancer   Cancer Brother        pancreatic cancer    ALLERGIES:  is allergic to pravachol [pravastatin].  MEDICATIONS:  Current Outpatient Medications  Medication Sig Dispense Refill   acetaminophen  (TYLENOL ) 500 MG tablet Take 1,000 mg by mouth every 6 (six) hours as needed for moderate pain (pain score 4-6) or mild pain (pain score 1-3).     alendronate (FOSAMAX) 70 MG tablet Take 70 mg by mouth once a week.     amLODipine (NORVASC) 2.5 MG tablet Take 2.5 mg by mouth daily.     apixaban  (ELIQUIS ) 2.5 MG TABS tablet Take 1 tablet (2.5 mg total) by mouth 2 (two) times daily. 180 tablet 3   CALCIUM PO Take 600 mg by mouth 2 (two) times daily.     cephALEXin  (KEFLEX ) 500 MG capsule Take 1 capsule (500 mg total) by mouth 3 (three) times daily for 5 days. 15 capsule 0   clopidogrel  (PLAVIX ) 75 MG tablet Take 1 tablet (75 mg total) by mouth daily with breakfast. 30 tablet 0   Cyanocobalamin  (VITAMIN B12 PO) Take 1 tablet by mouth daily at 12 noon.     etanercept (ENBREL) 50 MG/ML injection Inject 50 mg into the skin every Thursday.      fludrocortisone (FLORINEF) 0.1 MG tablet Take 0.05 mg by mouth daily.     gabapentin  (NEURONTIN ) 300 MG capsule Take 900 mg by mouth See admin instructions. At 4 pm     HYDROcodone -acetaminophen  (NORCO) 10-325 MG tablet Take 1 tablet by mouth every 6 (six) hours as needed for moderate pain (pain score 4-6).  isosorbide  mononitrate (IMDUR ) 120 MG 24 hr tablet Take 120 mg by mouth daily.      nitroGLYCERIN  (NITROSTAT ) 0.4 MG SL tablet Place 0.4 mg under the tongue every 5 (five) minutes as needed for chest pain.     predniSONE  (DELTASONE ) 5 MG tablet Take 7.5 mg  by mouth every morning.     ranolazine  (RANEXA ) 1000 MG SR tablet Take 1,000 mg by mouth 2 (two) times daily.     Vitamin D, Ergocalciferol, (DRISDOL) 1.25 MG (50000 UNIT) CAPS capsule Take 50,000 Units by mouth every 7 (seven) days.     hydroxyurea  (HYDREA ) 500 MG capsule Take 1 capsule (500 mg total) by mouth See admin instructions. May take with food to minimize GI side effects.TAKE 1 capsules per mouth daily for 6 days per week and take 2 capsules daily for 1 day per weeks. MAY TAKE WITH FOOD TO MINIMIZE GI SIDE EFFECTS 180 capsule 1   No current facility-administered medications for this visit.     PHYSICAL EXAMINATION: ECOG PERFORMANCE STATUS: 1 - Symptomatic but completely ambulatory Today's Vitals   02/27/24 1311  BP: 137/73  Pulse: 65  Resp: 18  Temp: (!) 96.8 F (36 C)  TempSrc: Tympanic  SpO2: 100%  Weight: 198 lb 14.4 oz (90.2 kg)  PainSc: 7   PainLoc: Foot   Body mass index is 26.98 kg/m.  SABRA Physical Exam Constitutional:      General: He is not in acute distress. HENT:     Head: Normocephalic and atraumatic.  Eyes:     General: No scleral icterus. Cardiovascular:     Rate and Rhythm: Normal rate and regular rhythm.  Pulmonary:     Effort: Pulmonary effort is normal. No respiratory distress.     Breath sounds: Normal breath sounds.  Abdominal:     General: Bowel sounds are normal.     Palpations: Abdomen is soft.  Musculoskeletal:        General: No deformity. Normal range of motion.     Cervical back: Normal range of motion and neck supple.  Skin:    General: Skin is warm and dry.  Neurological:     Mental Status: He is alert and oriented to person, place, and time. Mental status is at baseline.  Psychiatric:        Mood and Affect: Mood normal.     LABORATORY DATA:  I have reviewed the data as listed    Latest Ref Rng & Units 02/27/2024   12:55 PM 01/19/2024   10:48 AM 11/29/2023    1:08 PM  CBC  WBC 4.0 - 10.5 K/uL 12.4  9.7  8.9    Hemoglobin 13.0 - 17.0 g/dL 86.3  85.9  85.9   Hematocrit 39.0 - 52.0 % 42.5  43.9  43.9   Platelets 150 - 400 K/uL 115  178  114       Latest Ref Rng & Units 02/27/2024   12:55 PM 11/29/2023    1:08 PM 07/27/2023   10:55 AM  CMP  Glucose 70 - 99 mg/dL 86  97  93   BUN 8 - 23 mg/dL 10  12  14    Creatinine 0.61 - 1.24 mg/dL 8.99  9.07  9.06   Sodium 135 - 145 mmol/L 134  133  135   Potassium 3.5 - 5.1 mmol/L 4.0  4.0  4.1   Chloride 98 - 111 mmol/L 99  98  101   CO2 22 - 32 mmol/L 27  26  27   Calcium 8.9 - 10.3 mg/dL 8.8  8.2  8.2   Total Protein 6.5 - 8.1 g/dL 5.9  5.9  5.7   Total Bilirubin 0.0 - 1.2 mg/dL 0.8  1.1  1.5   Alkaline Phos 38 - 126 U/L 35  28  27   AST 15 - 41 U/L 12  14  17    ALT 0 - 44 U/L 6  9  10       Iron/TIBC/Ferritin/ %Sat    Component Value Date/Time   IRON 49 09/20/2019 1201   TIBC 318 09/20/2019 1201   FERRITIN 24 09/20/2019 1201   IRONPCTSAT 15 (L) 09/20/2019 1201      RADIOGRAPHIC STUDIES: I have personally reviewed the radiological images as listed and agreed with the findings in the report. DG Foot 2 Views Left Result Date: 02/26/2024 EXAM: 2 VIEW(S) XRAY OF THE LEFT FOOT 02/26/2024 01:53:00 AM COMPARISON: 10/19/2018. CLINICAL HISTORY: toe injury FINDINGS: BONES AND JOINTS: No definitive fracture is seen. No malalignment. Calcaneal spurs. First MTP degenerative change and mild hallux valgus deformity. SOFT TISSUES: Vascular calcifications. IMPRESSION: 1. No acute fracture. Electronically signed by: Oneil Devonshire MD 02/26/2024 02:04 AM EST RP Workstation: [id number]   CARDIAC CATHETERIZATION Result Date: 12/20/2023   Ost RCA lesion is 80% stenosed.   Dist RCA lesion is 50% stenosed.   RPDA lesion is 60% stenosed.   Prox LAD to Mid LAD lesion is 60% stenosed.   1st Diag lesion is 100% stenosed.   1st Mrg lesion is 70% stenosed.   Mid Cx lesion is 70% stenosed.   Previously placed Prox RCA stent of unknown type is  widely patent.   There is mild left  ventricular systolic dysfunction.   LV end diastolic pressure is normal.   The left ventricular ejection fraction is 45-50% by visual estimate. 1.  Three-vessel coronary artery disease with calcified 80% stenosis ostial RCA, patent stent proximal RCA, 70% stenosis mid left circumflex, segmental 75% stenosis OM1, 60% stenosis mid small caliber LAD, chronically occluded D1 2.  Mildly reduced left ventricular function, with estimate LV ejection fraction 45-50% Recommendations Complex PCI of calcified, 80% stenosis ostial RCA at [facility]      "

## 2024-02-27 NOTE — Assessment & Plan Note (Addendum)
 JAK2 V617F mutation. Labs reviewed and discussed with patient. Thrombocytopenia. No splenomegaly on physical examination.  Recommend to decrease hydroxyurea  regimen to  500mg  daily 6 days per week, and 1000 mg daily for 1 day per week.

## 2024-02-27 NOTE — Assessment & Plan Note (Signed)
 He weighed 226 Ib in Oct 2021, 222 Ib in March 2023, 213 Ib in  Dec 2023, March 2024 and now 206-208 Ib in March 2025.  Lost 3 pounds since October 2025. March 2025 CT chest abdomen pelvis w contrast did not have finding to explain weight loss.  Suspect weight loss to be due to his history of secondary adrenal insufficiency

## 2024-02-27 NOTE — Assessment & Plan Note (Signed)
 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  Continue observation

## 2024-02-27 NOTE — Assessment & Plan Note (Signed)
 Normal PTH He has been on calcium supplementation.

## 2024-02-27 NOTE — Assessment & Plan Note (Signed)
 eliquis  2.5mg  BID.  -Patient also takes  plavix  75 mg for CAD.

## 2024-03-01 ENCOUNTER — Encounter: Payer: Self-pay | Admitting: Podiatry

## 2024-03-01 ENCOUNTER — Ambulatory Visit: Admitting: Podiatry

## 2024-03-01 VITALS — Ht 72.0 in | Wt 198.9 lb

## 2024-03-01 DIAGNOSIS — S91205A Unspecified open wound of left lesser toe(s) with damage to nail, initial encounter: Secondary | ICD-10-CM

## 2024-03-01 MED ORDER — SILVER SULFADIAZINE 1 % EX CREA: 1.0000 | TOPICAL_CREAM | Freq: Every day | CUTANEOUS | 1 refills | Status: AC

## 2024-03-06 NOTE — Progress Notes (Signed)
 "  Chief Complaint  Patient presents with   Toe Injury    Pt is here due to injury to the left 3rd toe, states Sunday he dropped a water  bottle full of ice on the toe, was seen in the ER on 1/19 due to this x-rays were done and was told there was no fracture, states toenail was taken off due to trauma and received 5 stitches, has some swelling to the toe and continues to bleed, pain level at this time is 8.    HPI: 79 y.o. malepresenting is a new patient for evaluation of an injury to the left third toe sustained on Sunday, 02/26/2024.  He went to the emergency department and presents today for follow-up  Past Medical History:  Diagnosis Date   Arthritis    Bilateral pulmonary embolism (HCC) 03/2019   in setting of Covid-19 pneumonia   Cancer (HCC)    skin cancer   Cellulitis    Cellulitis    DVT (deep venous thrombosis) (HCC) 03/2019   in setting of Covid-19 pneumonia   Erythrocytosis 09/20/2019   H/O adenomatous polyp of colon 03/24/2015   HCAP (healthcare-associated pneumonia) 04/12/2019   History of brain disorder: history of amaurosis fugax 03/24/2015   History of pneumonia 04/04/2008   History of rheumatic fever 03/24/2015   DID Have Rheumatic Fever.     Hypertension    Left leg DVT (HCC) 04/12/2019   In setting of Covid-19 started anticoagulation 04-12-2019. Subsequently Dx PCV and long term NOAC per hematology    Peptic ulcer disease    Sepsis (HCC) 04/12/2019   secondary to Covid pneumonia    Past Surgical History:  Procedure Laterality Date   APPENDECTOMY     CORONARY STENT INTERVENTION N/A 01/24/2023   Procedure: CORONARY STENT INTERVENTION;  Surgeon: Ammon Blunt, MD;  Location: [facility] INVASIVE CV LAB;  Service: Cardiovascular;  Laterality: N/A;   LEFT HEART CATH AND CORONARY ANGIOGRAPHY Left 02/06/2018   Procedure: LEFT HEART CATH AND CORONARY ANGIOGRAPHY;  Surgeon: Bosie Vinie LABOR, MD;  Location: [facility] INVASIVE CV LAB;  Service: Cardiovascular;  Laterality: Left;    LEFT HEART CATH AND CORONARY ANGIOGRAPHY Left 01/24/2023   Procedure: LEFT HEART CATH AND CORONARY ANGIOGRAPHY;  Surgeon: Ammon Blunt, MD;  Location: [facility] INVASIVE CV LAB;  Service: Cardiovascular;  Laterality: Left;   LEFT HEART CATH AND CORONARY ANGIOGRAPHY Left 12/20/2023   Procedure: LEFT HEART CATH AND CORONARY ANGIOGRAPHY;  Surgeon: Ammon Blunt, MD;  Location: [facility] INVASIVE CV LAB;  Service: Cardiovascular;  Laterality: Left;   MANDIBLE FRACTURE SURGERY     fractured jaw   PARTIAL GASTRECTOMY     peptic ulcer disease    Allergies[1]   LT third toe 03/01/2024   Physical Exam: General: The patient is alert and oriented x3 in no acute distress.  Dermatology: Loss of the left third toenail plate noted with superficial wound encompassing the distal half of the toe.  No malodor or erythema concerning for underlying infection.  Overall appears stable  Vascular: Palpable pedal pulses bilaterally. Capillary refill within normal limits.  No appreciable edema.  No erythema.  Neurological: Grossly intact via light touch  Musculoskeletal Exam: No pedal deformities noted  DG Foot 2 Views Left (Accession [id number]) (Order [id number]) Imaging Date: 02/26/2024 Department: [facility] Released By/Authorizing: Trine Raynell Moder, MD (auto-released)  IMPRESSION: 1. No acute fracture.  Assessment/Plan of Care: 1.  Traumatic injury to the left third toe.  DOI: 02/26/2024  - Patient  evaluated -Recommend Silvadene  cream with a light dressing daily.  Silvadene  cream sent to the pharmacy -Postop shoe dispensed.  WBAT -Return to clinic 3 weeks     Thresa EMERSON Sar, DPM [facility]  Dr. Thresa EMERSON Sar, DPM    [address] [place], [place] [address]                Office [phone]  Fax [phone]        [1]  Allergies Allergen Reactions   Pravachol  [Pravastatin] Other (See Comments)    Caused FATIGUE   "

## 2024-03-15 ENCOUNTER — Ambulatory Visit: Admitting: Podiatry

## 2024-03-26 ENCOUNTER — Ambulatory Visit: Admitting: Podiatry

## 2024-05-27 ENCOUNTER — Inpatient Hospital Stay: Admitting: Oncology

## 2024-05-27 ENCOUNTER — Inpatient Hospital Stay
